# Patient Record
Sex: Female | Born: 1970 | Race: White | Hispanic: No | Marital: Married | Smoking: Never smoker
Health system: Southern US, Community
[De-identification: ages and names within clinical notes are randomized; demographics above are authoritative.]

## PROBLEM LIST (undated history)

## (undated) DIAGNOSIS — R002 Palpitations: Secondary | ICD-10-CM

## (undated) DIAGNOSIS — R7303 Prediabetes: Secondary | ICD-10-CM

## (undated) DIAGNOSIS — I10 Essential (primary) hypertension: Secondary | ICD-10-CM

## (undated) DIAGNOSIS — Z974 Presence of external hearing-aid: Secondary | ICD-10-CM

## (undated) DIAGNOSIS — I509 Heart failure, unspecified: Secondary | ICD-10-CM

## (undated) DIAGNOSIS — M2550 Pain in unspecified joint: Secondary | ICD-10-CM

## (undated) DIAGNOSIS — E669 Obesity, unspecified: Secondary | ICD-10-CM

## (undated) DIAGNOSIS — R0600 Dyspnea, unspecified: Secondary | ICD-10-CM

## (undated) DIAGNOSIS — R079 Chest pain, unspecified: Secondary | ICD-10-CM

## (undated) DIAGNOSIS — G4730 Sleep apnea, unspecified: Secondary | ICD-10-CM

## (undated) DIAGNOSIS — Z9889 Other specified postprocedural states: Secondary | ICD-10-CM

## (undated) DIAGNOSIS — Z87442 Personal history of urinary calculi: Secondary | ICD-10-CM

## (undated) DIAGNOSIS — M549 Dorsalgia, unspecified: Secondary | ICD-10-CM

## (undated) DIAGNOSIS — S42409A Unspecified fracture of lower end of unspecified humerus, initial encounter for closed fracture: Secondary | ICD-10-CM

## (undated) DIAGNOSIS — J45909 Unspecified asthma, uncomplicated: Secondary | ICD-10-CM

## (undated) DIAGNOSIS — Z9289 Personal history of other medical treatment: Secondary | ICD-10-CM

## (undated) DIAGNOSIS — Z973 Presence of spectacles and contact lenses: Secondary | ICD-10-CM

## (undated) DIAGNOSIS — E785 Hyperlipidemia, unspecified: Secondary | ICD-10-CM

## (undated) DIAGNOSIS — E559 Vitamin D deficiency, unspecified: Secondary | ICD-10-CM

## (undated) DIAGNOSIS — R600 Localized edema: Secondary | ICD-10-CM

## (undated) DIAGNOSIS — F909 Attention-deficit hyperactivity disorder, unspecified type: Secondary | ICD-10-CM

## (undated) DIAGNOSIS — I2510 Atherosclerotic heart disease of native coronary artery without angina pectoris: Secondary | ICD-10-CM

## (undated) DIAGNOSIS — F419 Anxiety disorder, unspecified: Secondary | ICD-10-CM

## (undated) DIAGNOSIS — L219 Seborrheic dermatitis, unspecified: Secondary | ICD-10-CM

## (undated) DIAGNOSIS — K219 Gastro-esophageal reflux disease without esophagitis: Secondary | ICD-10-CM

## (undated) HISTORY — DX: Prediabetes: R73.03

## (undated) HISTORY — DX: Other specified postprocedural states: Z98.890

## (undated) HISTORY — DX: Hyperlipidemia, unspecified: E78.5

## (undated) HISTORY — DX: Palpitations: R00.2

## (undated) HISTORY — DX: Dorsalgia, unspecified: M54.9

## (undated) HISTORY — DX: Dyspnea, unspecified: R06.00

## (undated) HISTORY — DX: Anxiety disorder, unspecified: F41.9

## (undated) HISTORY — DX: Unspecified asthma, uncomplicated: J45.909

## (undated) HISTORY — DX: Heart failure, unspecified: I50.9

## (undated) HISTORY — DX: Seborrheic dermatitis, unspecified: L21.9

## (undated) HISTORY — DX: Localized edema: R60.0

## (undated) HISTORY — DX: Pain in unspecified joint: M25.50

## (undated) HISTORY — DX: Personal history of other medical treatment: Z92.89

## (undated) HISTORY — DX: Essential (primary) hypertension: I10

## (undated) HISTORY — DX: Chest pain, unspecified: R07.9

---
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
## 1998-08-09 ENCOUNTER — Emergency Department (HOSPITAL_COMMUNITY): Admission: EM | Admit: 1998-08-09 | Discharge: 1998-08-09 | Payer: Self-pay | Admitting: Emergency Medicine

## 1998-08-12 ENCOUNTER — Encounter: Admission: RE | Admit: 1998-08-12 | Discharge: 1998-11-10 | Payer: Self-pay | Admitting: Internal Medicine

## 1999-05-13 ENCOUNTER — Emergency Department (HOSPITAL_COMMUNITY): Admission: EM | Admit: 1999-05-13 | Discharge: 1999-05-13 | Payer: Self-pay

## 1999-07-20 ENCOUNTER — Emergency Department (HOSPITAL_COMMUNITY): Admission: EM | Admit: 1999-07-20 | Discharge: 1999-07-20 | Payer: Self-pay | Admitting: Emergency Medicine

## 2000-10-10 ENCOUNTER — Inpatient Hospital Stay (HOSPITAL_COMMUNITY): Admission: AD | Admit: 2000-10-10 | Discharge: 2000-10-13 | Payer: Self-pay | Admitting: *Deleted

## 2001-11-29 ENCOUNTER — Other Ambulatory Visit: Admission: RE | Admit: 2001-11-29 | Discharge: 2001-11-29 | Payer: Self-pay | Admitting: Obstetrics and Gynecology

## 2002-12-20 ENCOUNTER — Other Ambulatory Visit: Admission: RE | Admit: 2002-12-20 | Discharge: 2002-12-20 | Payer: Self-pay | Admitting: Obstetrics and Gynecology

## 2004-01-21 ENCOUNTER — Other Ambulatory Visit: Admission: RE | Admit: 2004-01-21 | Discharge: 2004-01-21 | Payer: Self-pay | Admitting: Obstetrics and Gynecology

## 2004-11-04 ENCOUNTER — Encounter: Admission: RE | Admit: 2004-11-04 | Discharge: 2004-11-04 | Payer: Self-pay | Admitting: Family Medicine

## 2005-02-13 ENCOUNTER — Other Ambulatory Visit: Admission: RE | Admit: 2005-02-13 | Discharge: 2005-02-13 | Payer: Self-pay | Admitting: Obstetrics and Gynecology

## 2005-03-01 ENCOUNTER — Emergency Department (HOSPITAL_COMMUNITY): Admission: EM | Admit: 2005-03-01 | Discharge: 2005-03-01 | Payer: Self-pay | Admitting: Emergency Medicine

## 2005-03-06 ENCOUNTER — Ambulatory Visit (HOSPITAL_COMMUNITY): Admission: RE | Admit: 2005-03-06 | Discharge: 2005-03-06 | Payer: Self-pay | Admitting: Obstetrics and Gynecology

## 2005-03-06 HISTORY — PX: TUBAL LIGATION: SHX77

## 2005-03-06 HISTORY — PX: LAPAROSCOPIC LYSIS OF ADHESIONS: SHX5905

## 2005-10-07 ENCOUNTER — Encounter: Admission: RE | Admit: 2005-10-07 | Discharge: 2005-10-07 | Payer: Self-pay | Admitting: Otolaryngology

## 2005-10-13 ENCOUNTER — Other Ambulatory Visit: Admission: RE | Admit: 2005-10-13 | Discharge: 2005-10-13 | Payer: Self-pay | Admitting: Otolaryngology

## 2006-11-02 ENCOUNTER — Ambulatory Visit (HOSPITAL_COMMUNITY): Admission: RE | Admit: 2006-11-02 | Discharge: 2006-11-03 | Payer: Self-pay | Admitting: Obstetrics and Gynecology

## 2006-11-02 ENCOUNTER — Encounter (INDEPENDENT_AMBULATORY_CARE_PROVIDER_SITE_OTHER): Payer: Self-pay | Admitting: *Deleted

## 2006-11-02 HISTORY — PX: LAPAROSCOPIC ASSISTED VAGINAL HYSTERECTOMY: SHX5398

## 2007-03-23 ENCOUNTER — Ambulatory Visit (HOSPITAL_COMMUNITY): Admission: RE | Admit: 2007-03-23 | Discharge: 2007-03-23 | Payer: Self-pay | Admitting: Family Medicine

## 2007-08-12 ENCOUNTER — Encounter: Admission: RE | Admit: 2007-08-12 | Discharge: 2007-08-12 | Payer: Self-pay | Admitting: Family Medicine

## 2007-08-23 ENCOUNTER — Encounter: Admission: RE | Admit: 2007-08-23 | Discharge: 2007-08-23 | Payer: Self-pay | Admitting: Family Medicine

## 2008-09-18 DIAGNOSIS — L219 Seborrheic dermatitis, unspecified: Secondary | ICD-10-CM | POA: Insufficient documentation

## 2010-07-31 DIAGNOSIS — F411 Generalized anxiety disorder: Secondary | ICD-10-CM | POA: Insufficient documentation

## 2010-07-31 DIAGNOSIS — F3289 Other specified depressive episodes: Secondary | ICD-10-CM | POA: Insufficient documentation

## 2010-07-31 DIAGNOSIS — F418 Other specified anxiety disorders: Secondary | ICD-10-CM | POA: Insufficient documentation

## 2010-07-31 DIAGNOSIS — F32A Depression, unspecified: Secondary | ICD-10-CM | POA: Insufficient documentation

## 2010-08-20 DIAGNOSIS — E039 Hypothyroidism, unspecified: Secondary | ICD-10-CM | POA: Insufficient documentation

## 2010-08-20 DIAGNOSIS — R7309 Other abnormal glucose: Secondary | ICD-10-CM | POA: Insufficient documentation

## 2010-08-20 DIAGNOSIS — E559 Vitamin D deficiency, unspecified: Secondary | ICD-10-CM | POA: Insufficient documentation

## 2010-12-05 DIAGNOSIS — M546 Pain in thoracic spine: Secondary | ICD-10-CM | POA: Insufficient documentation

## 2010-12-05 DIAGNOSIS — M62838 Other muscle spasm: Secondary | ICD-10-CM | POA: Insufficient documentation

## 2010-12-05 DIAGNOSIS — J069 Acute upper respiratory infection, unspecified: Secondary | ICD-10-CM | POA: Insufficient documentation

## 2011-04-03 NOTE — H&P (Signed)
[name], CAMERER             ACCOUNT NO.:  [id number]   MEDICAL RECORD NO.:  [id number]          PATIENT TYPE:  AMB   LOCATION:                                F[facility]ILITY:  [facility]   PHYSICIAN:  Guy Sandifer. Henderson Cloud, M.D.      DATE OF BIRTH:   DATE OF ADMISSION:  10/29/2006  DATE OF DISCHARGE:                              HISTORY & PHYSICAL   CHIEF COMPLAINT:  Painful menses.   HISTORY OF PRESENT ILLNESS:  This patient is a 40 year old married white  female, G2 P2 with a long history of dysmenorrhea and dyspareunia.  Laparoscopy in 2006 was notable for a small band of omental adhesions  which was taken down.  Tubal ligation was performed, at that time as  well.  Since then, she continues to have significantly painful menses as  well as pain with intercourse.  After a careful discussion of the  options she is being admitted for laparoscopically assisted vaginal  hysterectomy.  The potential risks and complications have been discussed  with the patient preoperatively.  Ultrasound on 04/19/2006 revealed the  uterus measuring 96 x 4.7 x 5.9 cm with normal appearing ovaries.   PAST MEDICAL HISTORY:  1. Depression.  2. Hypothyroidism.   PAST SURGICAL HISTORY:  1. Laparoscopy with tubal ligation as above.  2. Root canal surgery on 10/27/2006.   OBSTETRIC HISTORY:  Cesarean section x2.   MEDICATIONS:  1. Zoloft daily.  2. Percocet p.r.n. pain status post root canal.  3. Ampicillin 250 mg status post root canal.   ALLERGIES:  No known drug allergies.   FAMILY HISTORY:  Coronary artery disease in the father and stroke in the  maternal grandmother, diabetes in the maternal great aunt, maternal  grandmother, and maternal first cousins.   SOCIAL HISTORY:  The patient denies tobacco, alcohol, or drug abuse.   REVIEW OF SYSTEMS:  NEUROLOGIC:  Denies headache, or chest pain.  PULMONARY:  Denies shortness of breath.  GI:  Denies recent changes in  bowel habits.   PHYSICAL EXAMINATION:   VITAL SIGNS:  Height 5 feet 3 inches.  Weight 206  pounds.  Blood pressure 112/72.  HEENT:  Without thyromegaly.  LUNGS:  Clear to auscultation.  HEART:  Regular rate and rhythm.  BACK:  Without CVA tenderness.  BREASTS:  Without masses, __________ or discharge.  ABDOMEN:  Soft nontender, without masses.  PELVIC:  Vulva, vagina, and cervix without lesions.  Uterus is mildly  tender.  Left adnexa nontender without masses. Right adnexa mildly  tender without masses.  EXTREMITIES:  Grossly within normal limits.  NEUROLOGICAL EXAM:  Grossly within normal limits.   ASSESSMENT:  Dysmenorrhea.   PLAN:  Laparoscopically assisted vaginal hysterectomy.      Guy Sandifer Henderson Cloud, M.D.  Electronically Signed     JET/MEDQ  D:  10/28/2006  T:  10/28/2006  Job:  [id number]

## 2011-04-03 NOTE — Op Note (Signed)
[facility]  Patient:    [name], [name]                      MRN: [id number] Adm. Date:  [id number] Attending:  Donne Hazel CC:         Roseanna Rainbow, M.D.  [facility], [place]   Operative Report  PREOPERATIVE DIAGNOSES:       1.  Intrauterine pregnancy at term.                               2.  Failed vaginal birth after cesarean section.                               3.  Arrest of dilatation.                               4.  Arrest of descent.                               5.  Occipitoposterior presentation.  POSTOPERATIVE DIAGNOSES:      1.  Intrauterine pregnancy at term.                               2.  Failed vaginal birth after cesarean section.                               3.  Arrest of dilatation.                               4.  Arrest of descent.                               5.  Occipitoposterior presentation.  OPERATION:                    Repeat cesarean section, low cervical transverse.  SURGEON:                      Beather Arbour. Thomasena Edis, M.D., Ph.D.  ASSISTANT:                    Roseanna Rainbow, M.D.  ANESTHESIA:                   Epidural.  ESTIMATED BLOOD LOSS:         800 cc.  FLUIDS:                       1900 cc of crystalloid and ampicillin GBS prophylaxis since onset of active labor.  DRAINS:                       Foley.  COMPLICATIONS:                None.  DESCRIPTION OF PROCEDURE:     The patient was brought to the operating room, identified while on the operating room table. After topping  off the patients epidural she was placed in a left uterine displacement position and prepped an d draped in the usual sterile fashion. A Foley catheter had been previously placed. A Pfannenstiel incision was made through the patients previous incision and carried down to the fascia. The fascia was scored in midline and extended bilaterally using Mayo scissors. It was then separated  free from the underlying muscles. The muscles were separated in midline down to the symphysis. The peritoneum was then entered bluntly and carefully taking care to avoid bowel or other abdominal contents. The peritoneal incision was extended superiorly then inferiorly down to the bladder edge. The bladder flap was developed and the bladder blade was placed. The uterus was scored in a lower uterine segment and the incision was extended in a curvilinear fashion using bandage scissors. The vertex was noted to be wedged deep into the pelvis and very gently elevated up to the level of the incision. It was noted the OP presentation was confirmed. The infant was delivered without difficulty and bulb suctioned. The remainder of the infant was delivered without difficulty. The cord was doubly clamped and cut and the baby was handed to the awaiting neonatal resuscitation team. Cord pH was sent. Cord bloods were collected. The placenta was allowed to delivery spontaneously. The uterus was wiped clean of products of conception and exteriorized onto the abdomen. There was noted to be one omental adhesion to the posterior surface of the uterus and this was divided using cautery and noted to be very hemostatic. The bladder blade was replaced. There was noted to be a small left uterine cervical extension which was repaired using a simple running interlocking suture of 0 Monocryl. This was noted to be very small, approximately 2.5 cm. The uterus was then closed using a simple running interlocking suture with 0 Monocryl. There was noted to be excellent cervical extension and uterine incision hemostasis with the exception of one bleeding area. Hemostasis of this area was achieved using a suture of 0 Monocryl. After again noting excellent uterine incision hemostasis, and noting that uterus, tubes, and ovaries appeared grossly normal, the uterus was placed back into the abdominal cavity. The incision  was irrigated copiously with warm lactated ringers. The gutters were irrigated free of clots. The incision was again inspected and noted to be extremely hemostatic. The bladder flap was noted to be hemostatic as well. The Kelly clamps per placed on the peritoneum and the peritoneum was closed using simple running suture of 2-0 Monocryl. Additional sutures of 2-0 Monocryl was used to tack the muscles together over the bladder. The subfascial areas were examined for evidence of hemostasis and excellent hemostasis was achieved using cautery. The fascia was closed using two sutures of 0 PDS with each suture anchored at either end at the angle of incision, run through the midline and tied. Excellent fascial closure was assured. The subcutaneous tissue was irrigated copiously with warm lactated ringers and excellent hemostasis was achieved using cautery. The skin was closed with staples and Steri-Strips were applied. The baby was found to be a viable female, Apgars 9 and 9, cord pH 7.32 and went to the normal newborn nursery. DD:  10/10/00 TD:  10/10/00 Job: [id number]

## 2011-04-03 NOTE — H&P (Signed)
[name], DELANGEL             ACCOUNT NO.:  [id number]   MEDICAL RECORD NO.:  [id number]          PATIENT TYPE:  AMB   LOCATION:  SDC                           [facility]ILITY:  [facility]   PHYSICIAN:  Guy Sandifer. Tomblin II, M.D.DATE OF BIRTH:  17-Apr-1971   DATE OF ADMISSION:  03/05/2005  DATE OF DISCHARGE:                                HISTORY & PHYSICAL   CHIEF COMPLAINT:  Desires permanent sterilization.   HISTORY OF PRESENT ILLNESS:  The patient is a 40 year old married white  female, G2, P2, who desires permanent sterilization.  She is being admitted  for a laparoscopy with tubal ligation with _Filshie_________ clips.  Alternate methods of contraception, as well as the potential risks and  complications, have been reviewed preoperatively.  The patient also  complains of some dyspareunia and dysmenorrhea recently.   PAST MEDICAL HISTORY:  1.  Depression.  2.  Hypothyroidism, currently not on replacement.   PAST SURGICAL HISTORY:  Negative.   OBSTETRIC HISTORY:  Cesarean section x2.   MEDICATIONS:  1.  Birth control pill daily.  2.  Zoloft 50 mg daily.   ALLERGIES:  No known drug allergies.   FAMILY HISTORY:  Coronary artery disease in father.  Stroke in maternal  grandmother.  Diabetes in maternal great aunt, paternal grandmother, and  paternal first cousin.   SOCIAL HISTORY:  The patient denies tobacco, alcohol, or drug abuse.   REVIEW OF SYSTEMS:  NEUROLOGIC:  Denies headache.  CARDIAC:  Denies chest  pain.  PULMONARY:  Denies shortness of breath.  GI:  Denies recent changes  in bowel habits.   PHYSICAL EXAMINATION:  VITAL SIGNS:  Height 5 feet, 3 inches, weight 206  pounds, blood pressure 126/80.  HEENT:  Within normal limits.  LUNGS:  Clear to auscultation.  HEART:  Regular rate and rhythm.  BACK:  Without CVA tenderness.  BREASTS:  Without mass, retraction, or discharge.  ABDOMEN:  Soft, nontender, without masses.  PELVIC:  Vulva, vagina, cervix without lesions.   Uterus normal size, mobile,  nontender.  Adnexa nontender without masses.  EXTREMITIES:  Neurological exam grossly within normal limits.   ASSESSMENT:  Desires permanent sterilization.   PLAN:  Laparoscopy with tubal ligation.      JET/MEDQ  D:  03/04/2005  T:  03/04/2005  Job:  [id number]

## 2011-04-03 NOTE — Discharge Summary (Signed)
NAMESHANTRELL, PLACZEK             ACCOUNT NO.:  [id number]   MEDICAL RECORD NO.:  [id number]          PATIENT TYPE:  OIB   LOCATION:  9319                          [facility]ACILITY:  [facility]   PHYSICIAN:  Guy Sandifer. Henderson Cloud, M.D. DATE OF BIRTH:  1971/05/01   DATE OF ADMISSION:  11/02/2006  DATE OF DISCHARGE:  11/03/2006                               DISCHARGE SUMMARY   ADMITTING DIAGNOSES:  1. Dysmenorrhea.  2. Dyspareunia.   DISCHARGE DIAGNOSES:  1. Dysmenorrhea.  2. Dyspareunia.   PROCEDURE:  On November 02, 2006, laparoscopically assisted vaginal  hysterectomy.   REASON FOR ADMISSION:  This patient is a 40 year old married white  female, G2, P2, with a long history of dysmenorrhea and dyspareunia.  She is admitted for surgical management.  Details are dictated in the  history and physical.   HOSPITAL COURSE:  She is admitted to the hospital and undergoes the  above procedure.  On the evening of surgery, she had nausea x1, but got  good relief with medication.  She had good pain relief.  Vital signs are  stable.  Urine output is clear.  On the day of discharge, she is passing  flatus, getting good pain relief and has no complaints of nausea.  Vital  signs are stable and she is afebrile.  Hemoglobin is 12.3, white count  8.9 and platelet count 275,000.  Pathology is pending.   CONDITION ON DISCHARGE:  Good.   DIET:  Regular as tolerated.   ACTIVITY:  No lifting.  No operation of automobiles.  No vaginal entry.   DISCHARGE INSTRUCTIONS:  She is to call the office for problems  including, but not limited to temperature of 101 degrees, heavy vaginal  bleeding, increasing pain or persistent nausea or vomiting.   FOLLOWUP:  In the office in 2 weeks.      Guy Sandifer Henderson Cloud, M.D.  Electronically Signed     JET/MEDQ  D:  11/03/2006  T:  11/03/2006  Job:  [id number]

## 2011-04-03 NOTE — Op Note (Signed)
NAMECYNTHIE, GARMON             ACCOUNT NO.:  [id number]   MEDICAL RECORD NO.:  [id number]          PATIENT TYPE:  AMB   LOCATION:  SDC                           [facility]   PHYSICIAN:  Guy Sandifer. Henderson Cloud, M.D. DATE OF BIRTH:  Mar 22, 1971   DATE OF PROCEDURE:  03/06/2005  DATE OF DISCHARGE:                                 OPERATIVE REPORT   PREOPERATIVE DIAGNOSIS:  Desires permanent sterilization.   POSTOPERATIVE DIAGNOSIS:  Desires permanent sterilization.   PROCEDURES:  1. Laparoscopy with bilateral tubal ligation with Filshie clips.  2. Lysis of adhesions.     SURGEON:  Guy Sandifer. Henderson Cloud, M.D.   ANESTHESIA:  General with endotracheal intubation.   ESTIMATED BLOOD LOSS:  Drops.   INDICATIONS AND CONSENT:  This patient is a 40 year old married white  female, G2, P2, who desires permanent sterilization.  Alternate methods of  contraception have been discussed preoperatively.  Potential risks and  complications have been discussed preoperatively, including but not limited  to infection; bowel, bladder or ureteral damage; bleeding requiring  transfusion of blood products with possible transfusion reaction, HIV and  hepatitis acquisition; DVT, PE, and pneumonia; permanence of the procedure,  failure rate and increased ectopic risk.  All questions have been answered  and consent is signed on the chart.   FINDINGS:  Upper abdomen is grossly normal.  There is a single thin band of  omental adhesion to the midline in the anterior abdominal wall.  The uterus  is upper normal limits.  Anterior, posterior cul-de-sacs, bilateral  fallopian tubes and bilateral ovaries are normal.   PROCEDURE:  The patient is taken to the operating room, where she is  identified, placed in the dorsal supine position, and general anesthesia is  induced via endotracheal intubation.  She is then placed in the dorsal  lithotomy position, where she is prepped abdominally and vaginally, the  bladder is  straight-catheterized, a Hulka tenaculum is placed in the uterus  as a manipulator.  She is draped in a sterile fashion.  A small  infraumbilical incision is made and a disposable Veress needle is placed  with good syringe and drop test.  Two liters of gas were then insufflated  under low pressure with good tympany in the right upper quadrant.  The  Veress needle was removed and the disposable 10/11 trocar sleeve is then  placed without difficulty.  Placement is verified with the laparoscope and  no damage to surrounding structures is noted.  A small suprapubic incision  is made and a 5 mm nondisposable trocar sleeve is placed under direct  visualization without difficulty.  Using bipolar cautery, the adhesion is  cauterized at its insertion point in the anterior abdominal wall and then  taken down sharply without difficulty.  Good hemostasis is noted.  The right  fallopian tube is identified from cornu to fimbriae and a Filshie slip is  applied on the proximal one-third of the tube.  A similar procedure is  carried out on the left tube.  After removal of the Filshie clip applicator,  inspection reveals the entire width of the tube  to be within the clip as  well as the heel of the clip to be readily visible through the mesosalpinx.  Good hemostasis is noted all around.  Suprapubic trocar sleeve is removed,  pneumoperitoneum is reduced, and the umbilical trocar sleeve is removed.  A  2-0 Vicryl suture is used to reapproximate the subcutaneous tissue in the  umbilical incision with a single stitch.  Both incisions are injected with  0.5% plain Marcaine, and Dermabond is used to close both skin incisions.  A  Hulka tenaculum is removed and no bleeding is noted.  All counts are  correct.  The patient is awakened and taken to the recovery room in stable  condition.      JET/MEDQ  D:  03/06/2005  T:  03/06/2005  Job:  [id number]

## 2011-04-03 NOTE — Discharge Summary (Signed)
[facility]  Patient:    [name], [name]                      MRN: [id number] Adm. Date:  [id number] Disc. Date: 10/13/00 Attending:  Donne Hazel Dictator:   Danie Chandler, R.N.                           Discharge Summary  ADMITTING DIAGNOSES:          1. Intrauterine pregnancy at term in early                                  labor.                               2. Desires vaginal birth after cesarean section.  DISCHARGE DIAGNOSES:          1. Intrauterine pregnancy at term in early                                  labor.                               2. Desires vaginal birth after cesarean section.                               3. Failed vaginal birth after cesarean section.                               4. Arrest of dilatation.                               5. Arrest of descent.                               6. Occipitoposterior presentation.  PROCEDURE:                    On October 10, 2000 repeat low cervical cesarean section.  REASON FOR ADMISSION:         The patient is a 40 year old married white female gravida 2, para 1 status post cesarean section in 1998.  The patient came to maternity admissions unit in early labor.  She desired a VBAC.  The patient at the time of admission was 5 cm, 80%, vertex, and -3.  Fetal heart tones were reactive.  The patient was admitted and an IUPC was placed.  The patient did have Pitocin augmentation which was turned off when there were some variables noted.  The patient progressed to 8 cm, -1 with much molding. Fetal heart tones remained reactive, but with some variables.  Uterine contractions were adequate for approximately two hours.  They were between 210-270 MVUs.  There was OP presentation noted.  In view of no appreciable change despite adequate uterine contractions documented by the IUPC decision was made to proceed with repeat cesarean section for arrest of descent, dilatation, failed  VBAC.  HOSPITAL COURSE:  The patient was taken to the operating room and underwent the above named procedure without complications.  Was productive of a viable female infant with Apgars of 9 at one minute and 9 at five minutes and an arterial cord pH of 7.32.  Postoperatively the patient did very well.  On postoperative day #1 the patient had a good return of bowel function.  She had good control of pain also.  Her hemoglobin on this day was 11.2, hematocrit 32.4, and white blood cell count 12.7.  On postoperative day #2 she was tolerating a regular diet and ambulating well without difficulty.  She was discharged home on postoperative day #3.  CONDITION ON DISCHARGE:       Good.  DIET:                         Regular, as tolerated.  ACTIVITY:                     No heavy lifting, driving, or vaginal entry.  FOLLOW-UP:                    She is to follow up in the office in one to two weeks for incision check.  She is to call for temperature greater than 100 degrees, persistent nausea or vomiting, heavy vaginal bleeding, and/or redness or drainage from the incision site.  DISCHARGE MEDICATIONS:        1. Prenatal vitamin one p.o. q.d.                               2. Pain medicines as directed by M.D.DD: 10/13/00 TD:  10/13/00 Job: [id number]

## 2011-04-03 NOTE — Op Note (Signed)
[name], [name]             ACCOUNT NO.:  [id number]   MEDICAL RECORD NO.:  [id number]          PATIENT TYPE:  OIB   LOCATION:  9319                          [facility]   PHYSICIAN:  Guy Sandifer. Henderson Cloud, M.D. DATE OF BIRTH:  06-04-1971   DATE OF PROCEDURE:  11/02/2006  DATE OF DISCHARGE:                               OPERATIVE REPORT   PREOPERATIVE DIAGNOSES:  1. Dysmenorrhea.  2. Dyspareunia.   POSTOPERATIVE DIAGNOSES:  1. Dysmenorrhea.  2. Dyspareunia.   PROCEDURE:  Laparoscopically-assisted vaginal hysterectomy.   SURGEON:  Guy Sandifer. Henderson Cloud, M.D.   ASSISTANT:  Dineen Kid. Rana Snare, M.D.   ANESTHESIA:  General with endotracheal intubation.   SPECIMENS:  Uterus to pathology.   ESTIMATED BLOOD LOSS:  300 mL.   INDICATIONS AND CONSENT:  This patient is a 40 year old married white  female, G2, P2 with a long history of dysmenorrhea and dyspareunia.  Details are dictated in the history and physical.  Laparoscopically-  assisted vaginal hysterectomy and removal of the tube and ovary only if  distinctly abnormal have been discussed preoperatively.  Potential risks  and complications are discussed preoperatively including, but limited  to, infection, bowel, bladder, ureteral damage, bleeding requiring  transfusion of blood products with possible transfusion reaction, HIV  and hepatitis acquisition, DVT, PE, pneumonia, laparotomy, fistula  formation, postoperative pain or dyspareunia.  All questions were  answered and consent signed on the chart.   FINDINGS:  Upper abdomen is grossly normal.  Uterus is 6 weeks in size.  Anterior and posterior cul-de-sacs were normal.  Tubes are status post  Filshie ligation bilaterally and ovaries are normal.   PROCEDURE:  The patient is taken to operating room where she is  identified, placed in the dorsal supine position.  General anesthesia is  induced via endotracheal intubation.  She is then placed in the dorsal  lithotomy position  where she is prepped abdominally and vaginally.  Bladder straight catheterized.  Hulka tenaculum is placed.  Uterus is  manipulated.  She is draped in sterile fashion.  Infraumbilical and  suprapubic areas are infiltrated in the midline with 0.50% plain  Marcaine.  Small infraumbilical incision is made and a disposable Veress  needle is placed on the first attempt without difficulty.  Syringe and  drop test are normal.  Two liters of gas are insufflated under low  pressure with good tympany in the right upper quadrant.  Veress needle  is removed and the 10/11 XL bladeless disposable trocar sleeve was is  placed using direct visualization with the diagnostic laparoscope.  After placement, the operative laparoscope is placed.  Small suprapubic  incision is made in the midline and a 5 mm XL bladeless disposable  trocar sleeve is placed under direct visualization.  The above findings  are noted.  The Filshie clips were easily removed bilaterally and are  delivered from the abdomen.  Proximal ligaments are then taken down  bilaterally with the Gyrus bipolar cautery cutting instrument down to  the level of the vesicouterine peritoneum.  Vesicouterine peritoneum is  taken down cephalolaterally.  Good hemostasis is noted.  Suprapubic  trocar sleeve is removed.  Instruments are removed and attention is  turned to the vagina.  Posterior cul-de-sac is entered sharply and the  cervix is circumscribed with cautery.  Mucosa is advanced sharply and  bluntly.  Uterosacral ligaments are taken down bilaterally with the  Gyrus bipolar cautery.  Progressive bites are taken of the cardinals,  uterine vessels bilaterally.  Anterior cul-de-sac is entered without  difficulty.  Specimen is delivered posteriorly and the proximal  ligaments are taken down.  Uterosacral ligaments are then plicated to  the vaginal cuff bilaterally with separate sutures of 0 Monocryl.  All  suture will be 0 Monocryl unless otherwise  designated.  Sutures are  placed at the 3 and 9 o'clock positions to assure complete hemostasis.  Cuff is then closed with figure-of-eights.  Foley catheter is placed in  the bladder and clear urine is noted.  Attention is returned to the  abdomen.  Minor bleeding at peritoneal edges is controlled with bipolar  cautery.  Inspection under reduced pneumoperitoneum reveals continued  hemostasis.  Excess fluid is removed.  All instruments are removed.  The  umbilical incision is closed with a 0 Vicryl suture in the subcutaneous  layers.  Mattress suture of 0 Vicryl is placed in the suprapubic  incision to aid in hemostasis.  Skin is closed with Dermabond on all  sites.  All counts are correct.  The patient is awakened and taken to  the recovery room in stable condition.      Guy Sandifer Henderson Cloud, M.D.  Electronically Signed     JET/MEDQ  D:  11/02/2006  T:  11/02/2006  Job:  [id number]

## 2011-08-24 ENCOUNTER — Encounter: Payer: Self-pay | Admitting: Cardiovascular Disease

## 2012-09-23 ENCOUNTER — Ambulatory Visit
Admission: RE | Admit: 2012-09-23 | Discharge: 2012-09-23 | Disposition: A | Discharge status: Home / Self Care | Payer: Self-pay | Source: Ambulatory Visit | Attending: Physician Assistant | Admitting: Physician Assistant

## 2012-09-23 ENCOUNTER — Other Ambulatory Visit: Payer: Self-pay | Admitting: Physician Assistant

## 2012-09-23 DIAGNOSIS — M25571 Pain in right ankle and joints of right foot: Secondary | ICD-10-CM

## 2013-01-15 ENCOUNTER — Emergency Department (HOSPITAL_BASED_OUTPATIENT_CLINIC_OR_DEPARTMENT_OTHER): Payer: 59

## 2013-01-15 ENCOUNTER — Encounter (HOSPITAL_BASED_OUTPATIENT_CLINIC_OR_DEPARTMENT_OTHER): Payer: Self-pay

## 2013-01-15 ENCOUNTER — Emergency Department (HOSPITAL_BASED_OUTPATIENT_CLINIC_OR_DEPARTMENT_OTHER)
Admission: EM | Admit: 2013-01-15 | Discharge: 2013-01-15 | Disposition: A | Discharge status: Home / Self Care | Payer: 59 | Attending: Emergency Medicine | Admitting: Emergency Medicine

## 2013-01-15 DIAGNOSIS — R110 Nausea: Secondary | ICD-10-CM | POA: Insufficient documentation

## 2013-01-15 DIAGNOSIS — S42401A Unspecified fracture of lower end of right humerus, initial encounter for closed fracture: Secondary | ICD-10-CM

## 2013-01-15 DIAGNOSIS — S52023A Displaced fracture of olecranon process without intraarticular extension of unspecified ulna, initial encounter for closed fracture: Secondary | ICD-10-CM | POA: Insufficient documentation

## 2013-01-15 DIAGNOSIS — Y929 Unspecified place or not applicable: Secondary | ICD-10-CM | POA: Insufficient documentation

## 2013-01-15 DIAGNOSIS — Y9355 Activity, bike riding: Secondary | ICD-10-CM | POA: Insufficient documentation

## 2013-01-15 DIAGNOSIS — E669 Obesity, unspecified: Secondary | ICD-10-CM | POA: Insufficient documentation

## 2013-01-15 DIAGNOSIS — S42409A Unspecified fracture of lower end of unspecified humerus, initial encounter for closed fracture: Secondary | ICD-10-CM

## 2013-01-15 HISTORY — DX: Obesity, unspecified: E66.9

## 2013-01-15 HISTORY — DX: Unspecified fracture of lower end of unspecified humerus, initial encounter for closed fracture: S42.409A

## 2013-01-15 MED ORDER — PROPOFOL 10 MG/ML IV BOLUS
0.5000 mg/kg | Freq: Once | INTRAVENOUS | Status: AC
  Administered 2013-01-15: 50 mg via INTRAVENOUS
  Administered 2013-01-15: 53.3 mg via INTRAVENOUS
  Filled 2013-01-15: qty 20

## 2013-01-15 MED ORDER — FENTANYL CITRATE 0.05 MG/ML IJ SOLN
INTRAMUSCULAR | Status: AC | PRN
  Administered 2013-01-15: 50 ug via INTRAVENOUS

## 2013-01-15 MED ORDER — SODIUM CHLORIDE 0.9 % IV SOLN
INTRAVENOUS | Status: DC
  Administered 2013-01-15: 18:00:00 via INTRAVENOUS

## 2013-01-15 MED ORDER — HYDROMORPHONE HCL PF 1 MG/ML IJ SOLN
INTRAMUSCULAR | Status: AC
  Administered 2013-01-15: 1 mg via INTRAVENOUS
  Filled 2013-01-15: qty 1

## 2013-01-15 MED ORDER — HYDROMORPHONE HCL PF 2 MG/ML IJ SOLN
2.0000 mg | Freq: Once | INTRAMUSCULAR | Status: DC
  Filled 2013-01-15: qty 1

## 2013-01-15 MED ORDER — ONDANSETRON HCL 4 MG/2ML IJ SOLN
4.0000 mg | Freq: Once | INTRAMUSCULAR | Status: AC
  Administered 2013-01-15: 4 mg via INTRAVENOUS
  Filled 2013-01-15: qty 2

## 2013-01-15 MED ORDER — FENTANYL CITRATE 0.05 MG/ML IJ SOLN
50.0000 ug | Freq: Once | INTRAMUSCULAR | Status: AC
  Administered 2013-01-15: 50 ug via INTRAVENOUS
  Filled 2013-01-15: qty 2

## 2013-01-15 MED ORDER — HYDROMORPHONE HCL PF 1 MG/ML IJ SOLN
1.0000 mg | Freq: Once | INTRAMUSCULAR | Status: AC
  Administered 2013-01-15: 1 mg via INTRAVENOUS

## 2013-01-15 MED ORDER — OXYCODONE-ACETAMINOPHEN 5-325 MG PO TABS: 1.0000 | ORAL_TABLET | Freq: Four times a day (QID) | ORAL | Status: DC | PRN

## 2013-01-15 NOTE — Progress Notes (Signed)
Shoulder immobilizer applied to patient's right arm.  Patient and family verbalized understanding of use and adjustment.
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
## 2013-01-15 NOTE — ED Notes (Signed)
MD at bedside. 
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
## 2013-01-15 NOTE — Discharge Instructions (Signed)
Radial Head Fracture  A radial head fracture is a break of the smaller bone (radius) in the forearm. The head of this bone is the part near the elbow. These fractures commonly happen during a fall when you land on the outstretched arm. These fractures are more common in middle aged adults and are common with a dislocation of the elbow.  SYMPTOMS    Swelling of the elbow joint and pain on the outside of the elbow.   Pain and difficulty in bending or straightening the elbow.   Pain and difficulty in turning the palm of the hand up or down with the elbow bent.  DIAGNOSIS   Your caregiver may make this diagnosis by a physical exam. X-rays can confirm the type and amount of break. Sometimes a break which is not displaced cannot be seen on the original x-ray.  TREATMENT   Radial head fractures are classified according to the amount of movement (displacement) of parts from the normal position.   Type 1 Fractures   Type 1 fractures are generally small fractures in which bone pieces remain together (non-displaced fracture).   The fracture may not be seen on initial X-rays. Usually if x-rays are repeated two to three weeks later, the fracture will show up. A splint or sling is used for a few days. Gentle early motion is used to prevent the elbow from becoming stiff. It should not be done vigorously or forced as this could displace the bone pieces.  Type 2 Fractures   With type 2 fractures, bone pieces are slightly displaced and larger pieces of bone are broken off.   If only a little displacement of the bone piece is present, splinting for 4 to 5 days usually works well. This is again followed with gentle active range of motion. Small fragments may be surgically removed.   Large pieces of bone that can be put back into place will sometimes be fixed with pins or screws to hold them until the bone is healed. If this cannot be done, the fragments are removed. For older, less active people, sometimes the entire radial  head is removed if the wrist is not injured. The elbow and arm will still work fine. Soft tissue, tendon, and ligament injuries are corrected at the same time.  Type 3 Fractures   Type 3 fractures have multiple broken pieces of bone which cannot be fixed. Surgery is usually needed to remove the broken bits of bone and what is left of the radial head. Soft-tissue damage is repaired. Gentle early motion is used to prevent the elbow from becoming stiff. Sometimes an artificial radial head can be used to prevent deformity if elbow is instable.  Rest, ice, elevation, immobilization, medications, and pain control are used in the early care.  HOME CARE INSTRUCTIONS    Keep the injured part elevated while sitting or lying down. Keep the injury above the level of your heart (the center of the chest). This will decrease swelling and pain.   Apply ice to the injury for 15 to 20 minutes, 3 to 4 times per day while awake, for 2 days. Put the ice in a plastic bag and place a towel between the bag of ice and your cast or splint.   Move your fingers to avoid stiffness and minimize swelling.   If you have a plaster or fiberglass cast:   Do not try to scratch the skin under the cast using sharp or pointed objects.   Check the skin   around the cast every day. You may put lotion on any red or sore areas.   Keep your cast dry and clean.   If you have a plaster splint:   Wear the splint as directed.   You may loosen the elastic around the splint if your fingers become numb, tingle, or turn cold or blue.   Do not put pressure on any part of your cast or splint. It may break. Rest your cast only on a pillow for the first 24 hours until it is fully hardened.   Your cast or splint can be protected during bathing with a plastic bag. Do not lower the cast or splint into water.   Only take over-the-counter or prescription medicines for pain, discomfort, or fever as directed by your caregiver.   Follow all instructions for follow  up with your caregiver. This includes any orthopedic referrals, physical therapy and rehabilitation. Any delay in obtaining necessary care could result in a delay or failure of the bones to heal or permanent elbow stiffness.   Do not over do exercises. This could further damage your injury.  SEEK IMMEDIATE MEDICAL CARE IF:    Your cast or splint gets damaged or breaks.   You have more severe pain or swelling than you did before getting the cast.   You have severe pain when stretching your fingers.   There is a bad smell, new stains and/or pus-like (purulent) drainage coming from under the cast.   Your fingers or hand turn pale or blue, become cold, or you lose feeling.  Document Released: 08/24/2006 Document Revised: 01/25/2012 Document Reviewed: 10/01/2009  [employer] Patient Information 2013 [employer]

## 2013-01-16 ENCOUNTER — Encounter (HOSPITAL_BASED_OUTPATIENT_CLINIC_OR_DEPARTMENT_OTHER): Payer: Self-pay | Admitting: *Deleted

## 2013-01-16 ENCOUNTER — Other Ambulatory Visit: Payer: Self-pay | Admitting: Orthopedic Surgery

## 2013-01-17 ENCOUNTER — Encounter (HOSPITAL_BASED_OUTPATIENT_CLINIC_OR_DEPARTMENT_OTHER): Payer: Self-pay | Admitting: *Deleted

## 2013-01-17 ENCOUNTER — Encounter (HOSPITAL_BASED_OUTPATIENT_CLINIC_OR_DEPARTMENT_OTHER)
Admission: RE | Disposition: A | Discharge status: Home / Self Care | Payer: Self-pay | Source: Ambulatory Visit | Attending: Orthopedic Surgery

## 2013-01-17 ENCOUNTER — Ambulatory Visit (HOSPITAL_BASED_OUTPATIENT_CLINIC_OR_DEPARTMENT_OTHER)
Admission: RE | Admit: 2013-01-17 | Discharge: 2013-01-17 | Disposition: A | Discharge status: Home / Self Care | Payer: 59 | Source: Ambulatory Visit | Attending: Orthopedic Surgery | Admitting: Orthopedic Surgery

## 2013-01-17 ENCOUNTER — Ambulatory Visit (HOSPITAL_BASED_OUTPATIENT_CLINIC_OR_DEPARTMENT_OTHER): Payer: 59 | Admitting: *Deleted

## 2013-01-17 DIAGNOSIS — S52009A Unspecified fracture of upper end of unspecified ulna, initial encounter for closed fracture: Secondary | ICD-10-CM | POA: Insufficient documentation

## 2013-01-17 HISTORY — DX: Unspecified fracture of lower end of unspecified humerus, initial encounter for closed fracture: S42.409A

## 2013-01-17 HISTORY — DX: Sleep apnea, unspecified: G47.30

## 2013-01-17 HISTORY — DX: Gastro-esophageal reflux disease without esophagitis: K21.9

## 2013-01-17 HISTORY — PX: RADIAL HEAD ARTHROPLASTY: SHX6044

## 2013-01-17 LAB — POCT HEMOGLOBIN-HEMACUE: Hemoglobin: 13.8 g/dL (ref 12.0–15.0)

## 2013-01-17 SURGERY — ARTHROPLASTY, RADIUS, HEAD
Anesthesia: Regional | Site: Elbow | Laterality: Right | Wound class: Clean

## 2013-01-17 MED ORDER — MIDAZOLAM HCL 2 MG/2ML IJ SOLN
1.0000 mg | INTRAMUSCULAR | Status: DC | PRN
  Administered 2013-01-17: 2 mg via INTRAVENOUS

## 2013-01-17 MED ORDER — LIDOCAINE HCL (CARDIAC) 20 MG/ML IV SOLN
INTRAVENOUS | Status: DC | PRN
  Administered 2013-01-17: 100 mg via INTRAVENOUS

## 2013-01-17 MED ORDER — FENTANYL CITRATE 0.05 MG/ML IJ SOLN
50.0000 ug | INTRAMUSCULAR | Status: DC | PRN
  Administered 2013-01-17: 100 ug via INTRAVENOUS

## 2013-01-17 MED ORDER — ONDANSETRON HCL 4 MG/2ML IJ SOLN: 4.0000 mg | Freq: Once | INTRAMUSCULAR | Status: DC | PRN

## 2013-01-17 MED ORDER — ONDANSETRON HCL 4 MG/2ML IJ SOLN
INTRAMUSCULAR | Status: DC | PRN
  Administered 2013-01-17: 4 mg via INTRAVENOUS

## 2013-01-17 MED ORDER — MIDAZOLAM HCL 2 MG/ML PO SYRP: 12.0000 mg | ORAL_SOLUTION | Freq: Once | ORAL | Status: DC | PRN

## 2013-01-17 MED ORDER — OXYCODONE-ACETAMINOPHEN 5-325 MG PO TABS: ORAL_TABLET | ORAL | Status: DC

## 2013-01-17 MED ORDER — PROPOFOL 10 MG/ML IV BOLUS
INTRAVENOUS | Status: DC | PRN
  Administered 2013-01-17: 200 mg via INTRAVENOUS

## 2013-01-17 MED ORDER — BUPIVACAINE-EPINEPHRINE PF 0.5-1:200000 % IJ SOLN
INTRAMUSCULAR | Status: DC | PRN
  Administered 2013-01-17: 30 mL

## 2013-01-17 MED ORDER — ACETAMINOPHEN 500 MG PO TABS
1000.0000 mg | ORAL_TABLET | Freq: Once | ORAL | Status: AC | PRN
  Administered 2013-01-17: 1000 mg via ORAL

## 2013-01-17 MED ORDER — PROMETHAZINE HCL 25 MG/ML IJ SOLN
6.2500 mg | INTRAMUSCULAR | Status: DC | PRN
  Administered 2013-01-17: 6.25 mg via INTRAVENOUS

## 2013-01-17 MED ORDER — OXYCODONE HCL 5 MG PO TABS: 5.0000 mg | ORAL_TABLET | Freq: Once | ORAL | Status: DC | PRN

## 2013-01-17 MED ORDER — HYDROMORPHONE HCL PF 1 MG/ML IJ SOLN: 0.2500 mg | INTRAMUSCULAR | Status: DC | PRN

## 2013-01-17 MED ORDER — LACTATED RINGERS IV SOLN
INTRAVENOUS | Status: DC
  Administered 2013-01-17: 20 mL/h via INTRAVENOUS
  Administered 2013-01-17 (×2): via INTRAVENOUS

## 2013-01-17 MED ORDER — DEXAMETHASONE SODIUM PHOSPHATE 10 MG/ML IJ SOLN
INTRAMUSCULAR | Status: DC | PRN
  Administered 2013-01-17: 5 mg via INTRAVENOUS

## 2013-01-17 MED ORDER — FENTANYL CITRATE 0.05 MG/ML IJ SOLN
INTRAMUSCULAR | Status: DC | PRN
  Administered 2013-01-17: 50 ug via INTRAVENOUS

## 2013-01-17 MED ORDER — ACETAMINOPHEN 325 MG PO TABS: 975.0000 mg | ORAL_TABLET | Freq: Once | ORAL | Status: DC | PRN

## 2013-01-17 MED ORDER — OXYCODONE HCL 5 MG/5ML PO SOLN: 5.0000 mg | Freq: Once | ORAL | Status: DC | PRN

## 2013-01-17 MED ORDER — CEFAZOLIN SODIUM-DEXTROSE 2-3 GM-% IV SOLR
2.0000 g | INTRAVENOUS | Status: AC
  Administered 2013-01-17: 2 g via INTRAVENOUS

## 2013-01-17 MED ORDER — MIDAZOLAM HCL 5 MG/5ML IJ SOLN
INTRAMUSCULAR | Status: DC | PRN
  Administered 2013-01-17: 1 mg via INTRAVENOUS

## 2013-01-17 MED ORDER — MEPERIDINE HCL 25 MG/ML IJ SOLN: 6.2500 mg | INTRAMUSCULAR | Status: DC | PRN

## 2013-01-17 MED ORDER — CHLORHEXIDINE GLUCONATE 4 % EX LIQD: 60.0000 mL | Freq: Once | CUTANEOUS | Status: DC

## 2013-01-17 SURGICAL SUPPLY — 81 items
BAG DECANTER FOR FLEXI CONT (MISCELLANEOUS) IMPLANT
BANDAGE ELASTIC 3 VELCRO ST LF (GAUZE/BANDAGES/DRESSINGS) ×2 IMPLANT
BANDAGE ELASTIC 4 VELCRO ST LF (GAUZE/BANDAGES/DRESSINGS) ×2 IMPLANT
BANDAGE GAUZE ELAST BULKY 4 IN (GAUZE/BANDAGES/DRESSINGS) ×4 IMPLANT
BLADE AVERAGE 25X9 (BLADE) ×2 IMPLANT
BLADE MINI RND TIP GREEN BEAV (BLADE) ×2 IMPLANT
BLADE SURG 15 STRL LF DISP TIS (BLADE) ×2 IMPLANT
BLADE SURG 15 STRL SS (BLADE) ×4
BNDG CMPR 9X4 STRL LF SNTH (GAUZE/BANDAGES/DRESSINGS) ×1
BNDG ESMARK 4X9 LF (GAUZE/BANDAGES/DRESSINGS) ×2 IMPLANT
BUR EGG 3PK/BX (BURR) IMPLANT
CANISTER SUCTION 1200CC (MISCELLANEOUS) ×2 IMPLANT
CHLORAPREP W/TINT 26ML (MISCELLANEOUS) ×2 IMPLANT
CLOTH BEACON ORANGE TIMEOUT ST (SAFETY) ×2 IMPLANT
CORDS BIPOLAR (ELECTRODE) ×2 IMPLANT
COVER MAYO STAND STRL (DRAPES) ×2 IMPLANT
COVER TABLE BACK 60X90 (DRAPES) ×2 IMPLANT
CUFF TOURNIQUET SINGLE 18IN (TOURNIQUET CUFF) ×1 IMPLANT
CUFF TOURNIQUET SINGLE 24IN (TOURNIQUET CUFF) ×1 IMPLANT
DECANTER SPIKE VIAL GLASS SM (MISCELLANEOUS) IMPLANT
DRAIN TLS ROUND 10FR (DRAIN) IMPLANT
DRAPE EXTREMITY T 121X128X90 (DRAPE) ×2 IMPLANT
DRAPE OEC MINIVIEW 54X84 (DRAPES) ×2 IMPLANT
DRAPE SURG 17X23 STRL (DRAPES) ×2 IMPLANT
DRSG PAD ABDOMINAL 8X10 ST (GAUZE/BANDAGES/DRESSINGS) ×2 IMPLANT
GAUZE SPONGE 4X4 16PLY XRAY LF (GAUZE/BANDAGES/DRESSINGS) IMPLANT
GAUZE XEROFORM 1X8 LF (GAUZE/BANDAGES/DRESSINGS) ×2 IMPLANT
GLOVE BIO SURGEON STRL SZ7.5 (GLOVE) ×1 IMPLANT
GLOVE BIOGEL PI IND STRL 8 (GLOVE) ×1 IMPLANT
GLOVE BIOGEL PI IND STRL 8.5 (GLOVE) ×1 IMPLANT
GLOVE BIOGEL PI INDICATOR 8 (GLOVE) ×1
GLOVE BIOGEL PI INDICATOR 8.5 (GLOVE) ×1
GLOVE INDICATOR 6.5 STRL GRN (GLOVE) ×1 IMPLANT
GLOVE INDICATOR 7.0 STRL GRN (GLOVE) ×1 IMPLANT
GLOVE SKINSENSE NS SZ6.5 (GLOVE) ×2
GLOVE SKINSENSE NS SZ7.5 (GLOVE) ×1
GLOVE SKINSENSE NS SZ8.0 LF (GLOVE) ×1
GLOVE SKINSENSE STRL SZ6.5 (GLOVE) IMPLANT
GLOVE SKINSENSE STRL SZ7.5 (GLOVE) IMPLANT
GLOVE SKINSENSE STRL SZ8.0 LF (GLOVE) IMPLANT
GLOVE SURG ORTHO 8.0 STRL STRW (GLOVE) ×1 IMPLANT
GLOVE SURG SS PI 8.5 STRL IVOR (GLOVE) ×1
GLOVE SURG SS PI 8.5 STRL STRW (GLOVE) ×1 IMPLANT
GOWN BRE IMP PREV XXLGXLNG (GOWN DISPOSABLE) ×3 IMPLANT
GOWN PREVENTION PLUS XLARGE (GOWN DISPOSABLE) ×3 IMPLANT
GOWN PREVENTION PLUS XXLARGE (GOWN DISPOSABLE) ×2 IMPLANT
GUIDEWIRE ORTHO 0.054X6 (WIRE) ×1 IMPLANT
GUIDEWIRE ORTHO MINI ACTK .045 (WIRE) ×1 IMPLANT
HEAD RADIAL RIGHT 20MM (Head) ×1 IMPLANT
NS IRRIG 1000ML POUR BTL (IV SOLUTION) ×2 IMPLANT
PACK BASIN DAY SURGERY FS (CUSTOM PROCEDURE TRAY) ×2 IMPLANT
PAD CAST 3X4 CTTN HI CHSV (CAST SUPPLIES) ×1 IMPLANT
PAD CAST 4YDX4 CTTN HI CHSV (CAST SUPPLIES) ×1 IMPLANT
PADDING CAST ABS 3INX4YD NS (CAST SUPPLIES) ×1
PADDING CAST ABS 4INX4YD NS (CAST SUPPLIES) ×1
PADDING CAST ABS COTTON 3X4 (CAST SUPPLIES) ×1 IMPLANT
PADDING CAST ABS COTTON 4X4 ST (CAST SUPPLIES) ×1 IMPLANT
PADDING CAST COTTON 3X4 STRL (CAST SUPPLIES) ×2
PADDING CAST COTTON 4X4 STRL (CAST SUPPLIES) ×2
SLEEVE SCD COMPRESS KNEE MED (MISCELLANEOUS) ×1 IMPLANT
SPLINT PLASTER CAST XFAST 3X15 (CAST SUPPLIES) IMPLANT
SPLINT PLASTER XTRA FASTSET 3X (CAST SUPPLIES)
SPONGE GAUZE 4X4 12PLY (GAUZE/BANDAGES/DRESSINGS) ×2 IMPLANT
SPONGE LAP 4X18 X RAY DECT (DISPOSABLE) ×4 IMPLANT
STAPLER VISISTAT (STAPLE) IMPLANT
STEM RADIAL HEAD 6.0X4.0 (Stem) ×1 IMPLANT
STOCKINETTE 4X48 STRL (DRAPES) ×2 IMPLANT
SUCTION FRAZIER TIP 10 FR DISP (SUCTIONS) IMPLANT
SUT ETHILON 3 0 PS 1 (SUTURE) IMPLANT
SUT ETHILON 4 0 PS 2 18 (SUTURE) IMPLANT
SUT VIC AB 0 SH 27 (SUTURE) ×2 IMPLANT
SUT VIC AB 2-0 SH 27 (SUTURE)
SUT VIC AB 2-0 SH 27XBRD (SUTURE) IMPLANT
SUT VICRYL 4-0 PS2 18IN ABS (SUTURE) IMPLANT
SYR BULB 3OZ (MISCELLANEOUS) ×2 IMPLANT
SYR CONTROL 10ML LL (SYRINGE) ×2 IMPLANT
TOWEL OR 17X24 6PK STRL BLUE (TOWEL DISPOSABLE) ×2 IMPLANT
TUBE ANAEROBIC SPECIMEN COL (MISCELLANEOUS) IMPLANT
TUBE CONNECTING 20X1/4 (TUBING) IMPLANT
UNDERPAD 30X30 INCONTINENT (UNDERPADS AND DIAPERS) ×2 IMPLANT
WATER STERILE IRR 1000ML POUR (IV SOLUTION) ×2 IMPLANT

## 2013-01-17 NOTE — Anesthesia Procedure Notes (Addendum)
Anesthesia Regional Block:  Supraclavicular block  Pre-Anesthetic Checklist: ,, timeout performed, Correct Patient, Correct Site, Correct Laterality, Correct Procedure, Correct Position, site marked, Risks and benefits discussed,  Surgical consent,  Pre-op evaluation,  At surgeon's request and post-op pain management  Laterality: Right  Prep: chloraprep       Needles:  Injection technique: Single-shot  Needle Type: Echogenic Stimulator Needle     Needle Length: 5cm 5 cm     Additional Needles:  Procedures: ultrasound guided (picture in chart) and nerve stimulator Supraclavicular block  Nerve Stimulator or Paresthesia:  Response: 0.4 mA,   Additional Responses:   Narrative:  Start time: 01/17/2013 10:50 AM End time: 01/17/2013 11:02 AM Injection made incrementally with aspirations every 5 mL.  Performed by: Personally  Anesthesiologist: Arta Bruce MD  Additional Notes: Monitors applied. Patient sedated. Sterile prep and drape,hand hygiene and sterile gloves were used. Relevant anatomy identified.Needle position confirmed.Local anesthetic injected incrementally after negative aspiration. Local anesthetic spread visualized around nerve(s). Vascular puncture avoided. No complications. Image printed for medical record.The patient tolerated the procedure well.       Supraclavicular block Procedure Name: LMA Insertion Date/Time: 01/17/2013 11:17 AM Performed by: Suann Larry WOLFE Pre-anesthesia Checklist: Patient identified, Emergency Drugs available, Suction available and Patient being monitored Patient Re-evaluated:Patient Re-evaluated prior to inductionOxygen Delivery Method: Circle System Utilized Preoxygenation: Pre-oxygenation with 100% oxygen Intubation Type: IV induction Ventilation: Mask ventilation without difficulty LMA: LMA inserted LMA Size: 4.0 Number of attempts: 1 Airway Equipment and Method: bite block Placement Confirmation: positive ETCO2 and breath  sounds checked- equal and bilateral Tube secured with: Tape Dental Injury: Teeth and Oropharynx as per pre-operative assessment

## 2013-01-17 NOTE — Op Note (Signed)
[id number] 

## 2013-01-17 NOTE — Anesthesia Postprocedure Evaluation (Signed)
  Anesthesia Post-op Note  Patient: [name]  Procedure(s) Performed: Procedure(s): RIGHT RADIAL HEAD ARTHROPLASTY OPEN REDUCTION INTERNAL FIXATION CORONOID  (Right)  Patient Location: PACU  Anesthesia Type:GA combined with regional for post-op pain  Level of Consciousness: awake, alert  and oriented  Airway and Oxygen Therapy: Patient Spontanous Breathing  Post-op Pain: none  Post-op Assessment: Post-op Vital signs reviewed, Patient's Cardiovascular Status Stable, Respiratory Function Stable, Patent Airway and No signs of Nausea or vomiting  Post-op Vital Signs: Reviewed and stable  Complications: No apparent anesthesia complications

## 2013-01-17 NOTE — Transfer of Care (Signed)
Immediate Anesthesia Transfer of Care Note  Patient: [name]  Procedure(s) Performed: Procedure(s): RIGHT RADIAL HEAD ARTHROPLASTY OPEN REDUCTION INTERNAL FIXATION CORONOID  (Right)  Patient Location: PACU  Anesthesia Type:GA combined with regional for post-op pain  Level of Consciousness: awake, oriented and patient cooperative  Airway & Oxygen Therapy: Patient Spontanous Breathing and Patient connected to face mask oxygen  Post-op Assessment: Report given to PACU RN, Post -op Vital signs reviewed and stable and Patient moving all extremities  Post vital signs: Reviewed and stable  Complications: No apparent anesthesia complications

## 2013-01-17 NOTE — Progress Notes (Signed)
Assisted Dr. Ossey with right, ultrasound guided, supraclavicular block. Side rails up, monitors on throughout procedure. See vital signs in flow sheet. Tolerated Procedure well. 
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
## 2013-01-17 NOTE — Anesthesia Preprocedure Evaluation (Addendum)
Anesthesia Evaluation  Patient identified by MRN, date of birth, ID band Patient awake    Reviewed: Allergy & Precautions, H&P , NPO status , Patient's Chart, lab work & pertinent test results  Airway Mallampati: II TM Distance: >3 FB Neck ROM: Full    Dental   Pulmonary sleep apnea and Continuous Positive Airway Pressure Ventilation ,          Cardiovascular     Neuro/Psych    GI/Hepatic GERD-  Medicated and Controlled,  Endo/Other    Renal/GU      Musculoskeletal   Abdominal   Peds  Hematology   Anesthesia Other Findings   Reproductive/Obstetrics                           Anesthesia Physical Anesthesia Plan  ASA: III  Anesthesia Plan: General   Post-op Pain Management:    Induction: Intravenous  Airway Management Planned: LMA  Additional Equipment:   Intra-op Plan:   Post-operative Plan: Extubation in OR  Informed Consent: I have reviewed the patients History and Physical, chart, labs and discussed the procedure including the risks, benefits and alternatives for the proposed anesthesia with the patient or authorized representative who has indicated his/her understanding and acceptance.     Plan Discussed with: CRNA and Surgeon  Anesthesia Plan Comments:         Anesthesia Quick Evaluation

## 2013-01-17 NOTE — Brief Op Note (Signed)
01/17/2013  1:35 PM  PATIENT:  [name]  42 y.o. female  PRE-OPERATIVE DIAGNOSIS:  right elbow fracture/ dislocation   POST-OPERATIVE DIAGNOSIS:  right elbow fracture/dislocation  PROCEDURE:  Procedure(s): RIGHT RADIAL HEAD ARTHROPLASTY OPEN REDUCTION INTERNAL FIXATION CORONOID  (Right)  SURGEON:  Surgeon(s) and Role:    * Tami Ribas, MD - Primary    * Nicki Reaper, MD - Assisting  PHYSICIAN ASSISTANT:   ASSISTANTS: Cindee Salt, MD   ANESTHESIA:   regional and general  EBL:  Total I/O In: 1750 [I.V.:1750] Out: -   BLOOD ADMINISTERED:none  DRAINS: none   LOCAL MEDICATIONS USED:  NONE  SPECIMEN:  No Specimen  DISPOSITION OF SPECIMEN:  N/A  COUNTS:  YES  TOURNIQUET:   Total Tourniquet Time Documented: Upper Arm (Right) - 115 minutes Total: Upper Arm (Right) - 115 minutes   DICTATION: .Other Dictation: Dictation Number [id number]  PLAN OF CARE: Discharge to home after PACU  PATIENT DISPOSITION:  PACU - hemodynamically stable.
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
## 2013-01-23 ENCOUNTER — Encounter (HOSPITAL_BASED_OUTPATIENT_CLINIC_OR_DEPARTMENT_OTHER): Payer: Self-pay | Admitting: Orthopedic Surgery

## 2013-05-05 ENCOUNTER — Ambulatory Visit (INDEPENDENT_AMBULATORY_CARE_PROVIDER_SITE_OTHER): Payer: 59 | Admitting: Family Medicine

## 2013-05-05 ENCOUNTER — Encounter: Payer: Self-pay | Admitting: Family Medicine

## 2013-05-05 VITALS — BP 122/88 | HR 84 | Temp 97.7°F | Resp 18 | Ht 62.5 in | Wt 240.0 lb

## 2013-05-05 DIAGNOSIS — M7989 Other specified soft tissue disorders: Secondary | ICD-10-CM

## 2013-05-05 DIAGNOSIS — E669 Obesity, unspecified: Secondary | ICD-10-CM

## 2013-05-05 MED ORDER — FUROSEMIDE 20 MG PO TABS: 20.0000 mg | ORAL_TABLET | Freq: Every day | ORAL | Status: DC | PRN

## 2013-05-05 NOTE — Patient Instructions (Addendum)
Start the lasix daily as needed Labs to be done for BNP F/U as needed

## 2013-05-06 LAB — COMPREHENSIVE METABOLIC PANEL
ALT: 17 U/L (ref 0–35)
CO2: 25 mEq/L (ref 19–32)
Calcium: 9.5 mg/dL (ref 8.4–10.5)
Chloride: 103 mEq/L (ref 96–112)
Creat: 0.61 mg/dL (ref 0.50–1.10)
Glucose, Bld: 78 mg/dL (ref 70–99)
Total Protein: 6.8 g/dL (ref 6.0–8.3)

## 2013-05-06 LAB — BRAIN NATRIURETIC PEPTIDE: Brain Natriuretic Peptide: 7.4 pg/mL (ref 0.0–100.0)

## 2013-05-07 ENCOUNTER — Encounter: Payer: Self-pay | Admitting: Family Medicine

## 2013-05-07 DIAGNOSIS — E669 Obesity, unspecified: Secondary | ICD-10-CM | POA: Insufficient documentation

## 2013-05-07 DIAGNOSIS — Z6841 Body Mass Index (BMI) 40.0 and over, adult: Secondary | ICD-10-CM | POA: Insufficient documentation

## 2013-05-07 DIAGNOSIS — M7989 Other specified soft tissue disorders: Secondary | ICD-10-CM | POA: Insufficient documentation

## 2013-05-07 NOTE — Progress Notes (Signed)
  Subjective:    Patient ID: [name], female    DOB: 11-Jun-1971, 42 y.o.   MRN: [id number]  HPI  Pt here with bilateral leg swelling on and off past few months. No history of CHF, HTN, or liver disease. No current medications. Has been using OTC diuretics with little improvement. Feet and legs feet tight during the day. 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 Denies any SOB or chest pain. Swelling does go down some in morning.    Review of Systems - per above   GEN- denies fatigue, fever, weight loss,weakness, recent illness HEENT- denies eye drainage, change in vision, nasal discharge, CVS- denies chest pain, palpitations RESP- denies SOB, cough, wheeze MSK- denies joint pain, muscle aches, injury Neuro- denies headache, dizziness, syncope, seizure activity      Objective:   Physical Exam  GEN- NAD, alert and oriented x3 HEENT- PERRL, EOMI, non injected sclera, pink conjunctiva, MMM, oropharynx clear Neck- Supple, no JVD CVS- RRR, no murmur RESP-CTAB ABD-NABS,soft,NT,ND, No HSM EXT-  bilat +Pitting  Edema to shin 1+ Pulses- Radial, DP- 2+       Assessment & Plan:

## 2013-05-07 NOTE — Assessment & Plan Note (Signed)
Discussed weight loss, increase activity

## 2013-05-07 NOTE — Assessment & Plan Note (Addendum)
bilat pitting edema, likley some pooling in regards to her standing and obesity Will start lasix, check BMP and metabolic panel ,previous labs show normal renal, liver and Thyroid function Advised compression hose Will recheck may need echo if no improvement

## 2013-07-13 ENCOUNTER — Encounter: Payer: Self-pay | Admitting: Physician Assistant

## 2013-07-13 ENCOUNTER — Ambulatory Visit (INDEPENDENT_AMBULATORY_CARE_PROVIDER_SITE_OTHER): Payer: 59 | Admitting: Physician Assistant

## 2013-07-13 VITALS — BP 128/94 | HR 88 | Temp 98.1°F | Resp 18 | Ht 62.0 in | Wt 237.0 lb

## 2013-07-13 DIAGNOSIS — R002 Palpitations: Secondary | ICD-10-CM

## 2013-07-14 NOTE — Progress Notes (Signed)
   Patient ID: [name] MRN: [id number], DOB: July 28, 1971, 42 y.o. Date of Encounter: 07/14/2013, 9:04 AM    Chief Complaint:  Chief Complaint  Patient presents with  . c/o feeling heart palpitations     HPI: 42 y.o. year old white female was sitting at her desk working just a few minutes ago when she felt palpitations. Would feel one palpitation, then feel like back to normal, then another palpitation. Had a co-worker check her pulse-they also felt that pulse was regular then an ectopic beat, then back to regular.  She had no associated chest pressure, heaviness, tightness, squeezing. No discomfort in her neck or arm. No nausea or diaphoresis.  She drank some coffee this morning but she says "no more than usual". No other caffeine consumption this morning. Taking no decongestants. She has not taken lasix in > 1 week because she has had no swelling recently. She only tkes it prn swelling.   Home Meds: See attached medication section for any medications that were entered at today's visit. The computer does not put those onto this list.The following list is a list of meds entered prior to today's visit.   Current Outpatient Prescriptions on File Prior to Visit  Medication Sig Dispense Refill  . furosemide (LASIX) 20 MG tablet Take 1 tablet (20 mg total) by mouth daily as needed.  30 tablet  3   No current facility-administered medications on file prior to visit.    Allergies:  Allergies  Allergen Reactions  . Adhesive [Tape] Rash  . Latex Rash      Review of Systems: See HPI for pertinent ROS. All other ROS negative.    Physical Exam: Blood pressure 128/94, pulse 88, temperature 98.1 F (36.7 C), temperature source Oral, resp. rate 18, height 5\' 2"  (1.575 m), weight 237 lb (107.502 kg)., Body mass index is 43.34 kg/(m^2). General:  WF. Appears in no acute distress. Neck: Supple. No thyromegaly. No lymphadenopathy. Lungs: Clear bilaterally to auscultation without  wheezes, rales, or rhonchi. Breathing is unlabored. Heart: Regular rhythm with occasional ectopy. No murmurs, rubs, or gallops. Abdomen: Soft, non-tender, non-distended with normoactive bowel sounds. No hepatomegaly. No rebound/guarding. No obvious abdominal masses. Msk:  Strength and tone normal for age. Extremities/Skin: Warm and dry. No clubbing or cyanosis. No edema. No rashes or suspicious lesions. Neuro: Alert and oriented X 3. Moves all extremities spontaneously. Gait is normal. CNII-XII grossly in tact. Psych:  Responds to questions appropriately with a normal affect.     ASSESSMENT AND PLAN:  42 y.o. year old female with  1. Palpitations EKG shows NSR with occasional PACs. Discussed with her this is benign. Discussed decreasing/stoppin caffeine. Discussed if they are bothering her or if she develops associated SOB/presyncope etc secondary to them then could add Cardizem or Beta Blocker. Seh defers Rx at this time. Will monitor. Reassured her.  - EKG 12-Lead   Signed, [address] [place], [place], [facility] 07/14/2013 9:04 AM

## 2013-07-21 ENCOUNTER — Ambulatory Visit: Payer: 59 | Admitting: Family Medicine

## 2013-07-21 DIAGNOSIS — Z23 Encounter for immunization: Secondary | ICD-10-CM

## 2013-07-31 ENCOUNTER — Telehealth: Payer: Self-pay | Admitting: Family Medicine

## 2013-07-31 MED ORDER — CIPROFLOXACIN-HYDROCORTISONE 0.2-1 % OT SUSP: 4.0000 [drp] | Freq: Two times a day (BID) | OTIC | Status: DC

## 2013-07-31 NOTE — Telephone Encounter (Signed)
Patient C/O right outer ear pain.  Per provider Cipro Otic drops sent to pharmacy.  Pt aware.

## 2013-08-01 ENCOUNTER — Telehealth: Payer: Self-pay | Admitting: Family Medicine

## 2013-08-01 MED ORDER — NEOMYCIN-POLYMYXIN-HC 1 % OT SOLN: 3.0000 [drp] | Freq: Four times a day (QID) | OTIC | Status: DC

## 2013-08-01 NOTE — Telephone Encounter (Signed)
Pt in office, Ciprodex called in copay is $50 unable to afford Will call in cortisporin drops instead

## 2013-08-28 ENCOUNTER — Encounter (INDEPENDENT_AMBULATORY_CARE_PROVIDER_SITE_OTHER): Payer: 59 | Admitting: Family Medicine

## 2013-08-28 ENCOUNTER — Ambulatory Visit (INDEPENDENT_AMBULATORY_CARE_PROVIDER_SITE_OTHER): Payer: 59 | Admitting: Family Medicine

## 2013-08-28 ENCOUNTER — Encounter: Payer: Self-pay | Admitting: Family Medicine

## 2013-08-28 VITALS — BP 140/90 | HR 90 | Temp 97.0°F | Resp 24

## 2013-08-28 DIAGNOSIS — J45901 Unspecified asthma with (acute) exacerbation: Secondary | ICD-10-CM

## 2013-08-28 DIAGNOSIS — J45909 Unspecified asthma, uncomplicated: Secondary | ICD-10-CM

## 2013-08-28 MED ORDER — ALBUTEROL SULFATE (2.5 MG/3ML) 0.083% IN NEBU: 2.5000 mg | INHALATION_SOLUTION | Freq: Four times a day (QID) | RESPIRATORY_TRACT | Status: DC | PRN

## 2013-08-28 MED ORDER — PREDNISONE 20 MG PO TABS: ORAL_TABLET | ORAL | Status: DC

## 2013-08-28 MED ORDER — IPRATROPIUM BROMIDE 0.02 % IN SOLN
0.5000 mg | Freq: Once | RESPIRATORY_TRACT | Status: AC
  Administered 2013-08-28: 0.5 mg via RESPIRATORY_TRACT

## 2013-08-28 MED ORDER — ALBUTEROL SULFATE HFA 108 (90 BASE) MCG/ACT IN AERS: 2.0000 | INHALATION_SPRAY | Freq: Four times a day (QID) | RESPIRATORY_TRACT | Status: DC | PRN

## 2013-08-28 MED ORDER — METHYLPREDNISOLONE ACETATE 80 MG/ML IJ SUSP
80.0000 mg | Freq: Once | INTRAMUSCULAR | Status: AC
  Administered 2013-08-28: 80 mg via INTRAMUSCULAR
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
## 2013-08-29 NOTE — Progress Notes (Signed)
This encounter was created in error - please disregard.

## 2013-11-20 ENCOUNTER — Other Ambulatory Visit: Payer: Self-pay | Admitting: Obstetrics and Gynecology

## 2013-11-20 ENCOUNTER — Other Ambulatory Visit: Payer: Self-pay | Admitting: Family Medicine

## 2013-11-20 DIAGNOSIS — N644 Mastodynia: Secondary | ICD-10-CM

## 2013-11-20 MED ORDER — ALPRAZOLAM 0.25 MG PO TABS: 0.2500 mg | ORAL_TABLET | Freq: Every day | ORAL | Status: DC | PRN

## 2013-11-20 NOTE — Telephone Encounter (Signed)
Medication refilled per protocol. Rx faxed to pharmacy

## 2013-11-23 ENCOUNTER — Other Ambulatory Visit: Payer: Self-pay | Admitting: Obstetrics and Gynecology

## 2013-11-23 ENCOUNTER — Ambulatory Visit
Admission: RE | Admit: 2013-11-23 | Discharge: 2013-11-23 | Disposition: A | Discharge status: Home / Self Care | Payer: 59 | Source: Ambulatory Visit | Attending: Obstetrics and Gynecology | Admitting: Obstetrics and Gynecology

## 2013-11-23 DIAGNOSIS — N644 Mastodynia: Secondary | ICD-10-CM

## 2014-01-04 ENCOUNTER — Encounter: Payer: Self-pay | Admitting: Physician Assistant

## 2014-01-04 ENCOUNTER — Ambulatory Visit (INDEPENDENT_AMBULATORY_CARE_PROVIDER_SITE_OTHER): Payer: 59 | Admitting: Physician Assistant

## 2014-01-04 VITALS — BP 132/70 | HR 84 | Temp 98.0°F | Resp 18 | Ht 62.0 in | Wt 235.0 lb

## 2014-01-04 DIAGNOSIS — F988 Other specified behavioral and emotional disorders with onset usually occurring in childhood and adolescence: Secondary | ICD-10-CM

## 2014-01-04 MED ORDER — AMPHETAMINE-DEXTROAMPHETAMINE 20 MG PO TABS: 20.0000 mg | ORAL_TABLET | Freq: Two times a day (BID) | ORAL | Status: DC

## 2014-01-04 NOTE — Progress Notes (Signed)
Patient ID: BURNELL MATLIN MRN: [id number], DOB: July 07, 1971, 43 y.o. Date of Encounter: 01/04/2014, 3:38 PM    Chief Complaint:  Chief Complaint  Patient presents with  . c/o migraine HA x 3-4 days     HPI: 43 y.o. year old female reports that she has had a dull generalized headache for the past 3 or 4 days. Says it really seems to be over her entire head. Reports no photophobia. Does have some phonophobia.  Says that she had headaches in the past and was told that they were migraines and was treated with Imitrex. However she only ever took one Imitrex because it caused her to feel sweaty, dizzy, and made her "feel funny" so she never took it anymore. Says that she gets a headache occasionally. However they are usually more generalized and  all over. Does not get severe pain behind one eye and does not  develop severe photophobia, phonophobia, nausea or vomiting. His taking ibuprofen 400 mg the last 2 nights. His taken one dose of Tylenol. Does continue to have this dull diffuse headache constant for several days now.  Also scheduled office visit today to discuss options for treatment to help improve attention. She reports that she is having difficulty with focusing and paying attention and completing tasks at her job. Reports that this is causing problems for her at her job. States that her supervisor has met with her and has voiced concern to her. She reports that she starts working on something but then forgets to complete the entire project. Forgets to complete all documentation. Has hard time concentrating, focusing, and staying on task.      Home Meds: See attached medication section for any medications that were entered at today's visit. The computer does not put those onto this list.The following list is a list of meds entered prior to today's visit.   Current Outpatient Prescriptions on File Prior to Visit  Medication Sig Dispense Refill  . albuterol (PROVENTIL HFA;VENTOLIN  HFA) 108 (90 BASE) MCG/ACT inhaler Inhale 2 puffs into the lungs every 6 (six) hours as needed for wheezing.  1 Inhaler  0  . ALPRAZolam (XANAX) 0.25 MG tablet Take 1 tablet (0.25 mg total) by mouth daily as needed for anxiety.  30 tablet  1   No current facility-administered medications on file prior to visit.    Allergies:  Allergies  Allergen Reactions  . Adhesive [Tape] Rash  . Latex Rash      Review of Systems: See HPI for pertinent ROS. All other ROS negative.    Physical Exam: Blood pressure 132/70, pulse 84, temperature 98 F (36.7 C), temperature source Oral, resp. rate 18, height $RemoveBe'5\' 2"'EcjZTLANu$  (1.575 m), weight 235 lb (106.595 kg)., Body mass index is 42.97 kg/(m^2). General: WF. Appears in no acute distress. Neck: Supple. No thyromegaly. No lymphadenopathy. No tenderness with palpation of the neck and scapula area. Lungs: Clear bilaterally to auscultation without wheezes, rales, or rhonchi. Breathing is unlabored. Heart: Regular rhythm. No murmurs, rubs, or gallops. Msk:  Strength and tone normal for age. Extremities/Skin: Warm and dry. Neuro: Alert and oriented X 3. Moves all extremities spontaneously. Gait is normal. CNII-XII grossly in tact. Psych:  Responds to questions appropriately with a normal affect.     ASSESSMENT AND PLAN:  43 y.o. year old female with  1. ADD (attention deficit disorder) - amphetamine-dextroamphetamine (ADDERALL) 20 MG tablet; Take 1 tablet (20 mg total) by mouth 2 (two) times daily.  Dispense: 60  tablet; Refill: 0 Will try this med. She is to take dose in morning. Take 2nd dose around 12 noon.  Discussed that can cause insomnia so do not take 2nd dose later in day.  Discussed that on days that she is not working and does not need increased focus and attention, she can skip taking the medication on those days. Does Not have to take it on weekends or on holidays/vacation time. Will plan for followup office visit in one month to reassess. Followup  sooner if it causes any adverse effects.  2. Headache: I think her headache is secondary to stress and tension. Discussed medication options. She wants to continue over-the-counter medications. She is to continue Tylenol and ibuprofen but can use these on a more routine basis to try to get rid of this current headache. Follow up if symptoms worsen or she develops new additional symptoms.  Marin Olp [place], [place], [facility] 01/04/2014 3:38 PM

## 2014-01-29 ENCOUNTER — Telehealth: Payer: Self-pay | Admitting: Family Medicine

## 2014-01-29 MED ORDER — HYDROCORTISONE ACETATE 25 MG RE SUPP: 25.0000 mg | Freq: Two times a day (BID) | RECTAL | Status: DC

## 2014-01-29 NOTE — Telephone Encounter (Signed)
Pt called with hemorrhoid problem, using tucks pads with no improvement, blood streaks with stool On stool softner Start anusol suppository if not better, refer to GI

## 2014-02-27 ENCOUNTER — Encounter: Payer: Self-pay | Admitting: Physician Assistant

## 2014-02-28 MED ORDER — FUROSEMIDE 20 MG PO TABS: 20.0000 mg | ORAL_TABLET | Freq: Every day | ORAL | Status: DC

## 2014-02-28 NOTE — Telephone Encounter (Signed)
Medication refilled per protocol. 

## 2014-03-02 ENCOUNTER — Other Ambulatory Visit: Payer: Self-pay | Admitting: Family Medicine

## 2014-03-02 DIAGNOSIS — J020 Streptococcal pharyngitis: Secondary | ICD-10-CM

## 2014-03-02 MED ORDER — AMOXICILLIN 875 MG PO TABS: 875.0000 mg | ORAL_TABLET | Freq: Two times a day (BID) | ORAL | Status: DC

## 2014-03-02 NOTE — Progress Notes (Signed)
Patient's throat is erythematous with white exudate.  Rapid strep screen is positive.

## 2014-03-12 ENCOUNTER — Other Ambulatory Visit: Payer: Self-pay | Admitting: Family Medicine

## 2014-03-12 ENCOUNTER — Encounter: Payer: Self-pay | Admitting: Internal Medicine

## 2014-03-12 DIAGNOSIS — K921 Melena: Secondary | ICD-10-CM

## 2014-03-12 DIAGNOSIS — K649 Unspecified hemorrhoids: Secondary | ICD-10-CM

## 2014-03-21 ENCOUNTER — Encounter: Payer: Self-pay | Admitting: Physician Assistant

## 2014-03-21 ENCOUNTER — Ambulatory Visit (INDEPENDENT_AMBULATORY_CARE_PROVIDER_SITE_OTHER): Payer: 59 | Admitting: Physician Assistant

## 2014-03-21 VITALS — BP 134/94 | HR 84 | Temp 97.4°F | Resp 18 | Wt 229.0 lb

## 2014-03-21 DIAGNOSIS — A499 Bacterial infection, unspecified: Secondary | ICD-10-CM

## 2014-03-21 DIAGNOSIS — J988 Other specified respiratory disorders: Secondary | ICD-10-CM

## 2014-03-21 DIAGNOSIS — B9689 Other specified bacterial agents as the cause of diseases classified elsewhere: Secondary | ICD-10-CM

## 2014-03-21 MED ORDER — AZITHROMYCIN 250 MG PO TABS: ORAL_TABLET | ORAL | Status: DC

## 2014-03-21 MED ORDER — PREDNISONE 20 MG PO TABS: ORAL_TABLET | ORAL | Status: DC

## 2014-03-21 NOTE — Progress Notes (Signed)
Patient ID: [name] MRN: [id number], DOB: 1971-02-12, 43 y.o. Date of Encounter: 03/21/2014, 11:13 AM    Chief Complaint:  Chief Complaint  Patient presents with  . Headache  . Cough     HPI: 43 y.o. year old female has had head and chest congestion for about 5 days now. On Monday 03/19/14 she sounded very congested when she would talk--a lot of congestion in head, nose, throat. Now is also having a lot of Cough. Yesterday she had a lot of headache. Initially it seemed to be more up at the top of her head but now it seems to be more of starting near her eyes and going around her entire head. Says that her entire head  does feel like there is a lot of pressure. Also yesterday afternoon at about 4:00 PM she had a sharp pain that only lasted about 5 seconds in her head then it returned to her usual/prior/baseline headache. At about 5:30 she was driving home from work, and she felt sweaty and lightheaded. When she got home she felt exhausted and laid on the couch. This morning when she woke up the headache was still present. Also still with congestion and a cough. Has been taking Aleve sinus and cold. Also Sudafed p.m." pain and pressure". Getting no relief. In regards to the headache: She has had no vision changes. She has had no neurologic changes. No changes in balance or gait. No weakness in any extremity. No slurred speech. She reports that she has had no recent head injury or trauma.     Home Meds: See attached medication section for any medications that were entered at today's visit. The computer does not put those onto this list.The following list is a list of meds entered prior to today's visit.   Current Outpatient Prescriptions on File Prior to Visit  Medication Sig Dispense Refill  . albuterol (PROVENTIL HFA;VENTOLIN HFA) 108 (90 BASE) MCG/ACT inhaler Inhale 2 puffs into the lungs every 6 (six) hours as needed for wheezing.  1 Inhaler  0  . ALPRAZolam (XANAX) 0.25 MG tablet  Take 1 tablet (0.25 mg total) by mouth daily as needed for anxiety.  30 tablet  1  . amoxicillin (AMOXIL) 875 MG tablet Take 1 tablet (875 mg total) by mouth 2 (two) times daily.  20 tablet  0  . amphetamine-dextroamphetamine (ADDERALL) 20 MG tablet Take 1 tablet (20 mg total) by mouth 2 (two) times daily.  60 tablet  0  . furosemide (LASIX) 20 MG tablet Take 1 tablet (20 mg total) by mouth daily. Does take two (40 mg) as needed  30 tablet  5  . hydrocortisone (ANUSOL-HC) 25 MG suppository Place 1 suppository (25 mg total) rectally 2 (two) times daily. For 2 weeks  12 suppository  1  . vitamin E 400 UNIT capsule Take 400 Units by mouth daily.       No current facility-administered medications on file prior to visit.    Allergies:  Allergies  Allergen Reactions  . Adhesive [Tape] Rash  . Latex Rash      Review of Systems: See HPI for pertinent ROS. All other ROS negative.    Physical Exam: Blood pressure 134/94, pulse 84, temperature 97.4 F (36.3 C), temperature source Oral, resp. rate 18, weight 229 lb (103.874 kg)., Body mass index is 41.87 kg/(m^2). General: WF.  Appears in no acute distress. HEENT: Normocephalic, atraumatic, eyes without discharge, sclera non-icteric, nares are without discharge. Bilateral auditory canals  clear, TM's are without perforation, pearly grey and translucent with reflective cone of light bilaterally. Oral cavity moist, posterior pharynx without exudate, erythema, peritonsillar abscess. No tenderness with percussion of frontal and maxillary sinuses.   Neck: Supple. No thyromegaly. No lymphadenopathy. Lungs: Clear bilaterally to auscultation without wheezes, rales, or rhonchi. Breathing is unlabored. Heart: Regular rhythm. No murmurs, rubs, or gallops. Msk:  Strength and tone normal for age. Extremities/Skin: Warm and dry. Neuro: Alert and oriented X 3. Moves all extremities spontaneously. Gait is normal. CNII-XII grossly in tact. Psych:  Responds to  questions appropriately with a normal affect.     ASSESSMENT AND PLAN:  43 y.o. year old female with  1. Bacterial respiratory infection - azithromycin (ZITHROMAX) 250 MG tablet; Day 1: Take 2 daily.  Days 2-5: Take 1 daily.  Dispense: 6 tablet; Refill: 0 - predniSONE (DELTASONE) 20 MG tablet; Take 3 daily for 2 days, then 2 daily for 2 days, then 1 daily for 2 days.  Dispense: 12 tablet; Refill: 0 I think  the headache she is experiencing is secondary to pressure and congestion and inflammation in her head and sinuses. I think the sharp, fleeting pain that she experienced yesterday afternoon was secondary to some inflammation and swelling around a nerve. she is to go ahead and start the prednisone now so can monitor her response to this. If headache does not improve after this then would consider CT scan.  F/U if  headache does not improve with prednisone. F/U if all symptoms do not resolve within one week after completion of antibiotic and prednisone.  Signed, [address] [place], [place], [facility] 03/21/2014 11:13 AM

## 2014-04-06 ENCOUNTER — Other Ambulatory Visit: Payer: Self-pay | Admitting: Physician Assistant

## 2014-04-06 DIAGNOSIS — F988 Other specified behavioral and emotional disorders with onset usually occurring in childhood and adolescence: Secondary | ICD-10-CM

## 2014-04-06 MED ORDER — AMPHETAMINE-DEXTROAMPHETAMINE 20 MG PO TABS
20.0000 mg | ORAL_TABLET | Freq: Two times a day (BID) | ORAL | Status: DC
Start: 2014-04-06 — End: 2014-06-20

## 2014-04-06 NOTE — Telephone Encounter (Signed)
Approved. Please print Rx for her to take to pharmacy.

## 2014-04-06 NOTE — Telephone Encounter (Signed)
rx printed, pt aware

## 2014-04-06 NOTE — Telephone Encounter (Signed)
Ok to refill??  Last office visit for ADD 01/04/2014.   Last office visit 03/24/2014.  Last refill 01/04/2014.

## 2014-05-01 ENCOUNTER — Encounter: Payer: Self-pay | Admitting: Internal Medicine

## 2014-05-02 ENCOUNTER — Encounter: Payer: Self-pay | Admitting: Internal Medicine

## 2014-05-02 ENCOUNTER — Ambulatory Visit (INDEPENDENT_AMBULATORY_CARE_PROVIDER_SITE_OTHER): Payer: 59 | Admitting: Internal Medicine

## 2014-05-02 VITALS — BP 126/88 | HR 92 | Ht 63.0 in | Wt 228.4 lb

## 2014-05-02 DIAGNOSIS — K219 Gastro-esophageal reflux disease without esophagitis: Secondary | ICD-10-CM | POA: Insufficient documentation

## 2014-05-02 DIAGNOSIS — K625 Hemorrhage of anus and rectum: Secondary | ICD-10-CM

## 2014-05-02 DIAGNOSIS — K6289 Other specified diseases of anus and rectum: Secondary | ICD-10-CM

## 2014-05-02 DIAGNOSIS — K602 Anal fissure, unspecified: Secondary | ICD-10-CM | POA: Insufficient documentation

## 2014-05-02 MED ORDER — DILTIAZEM GEL 2 %: CUTANEOUS | Status: DC

## 2014-05-02 MED ORDER — MOVIPREP 100 G PO SOLR: 1.0000 | Freq: Once | ORAL | Status: DC

## 2014-05-02 NOTE — Progress Notes (Signed)
Patient ID: [name], female   DOB: 11/04/1971, 43 y.o.   MRN: [id number] HPI: [name] is a 43 year old female with a past medical history of GERD, sleep apnea, mild asthma, and intermittent headaches was seen in consultation at the request of Karis Juba to evaluate rectal pain and bleeding. She is here alone today. She reports she developed rectal and perianal pain along with bleeding in April 2015. 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 She reports she was examined by Dr. Buelah Manis and was diagnosed with external hemorrhoids, perhaps internal hemorrhoids as well. She was given Anusol suppositories. She has taken several courses of Anusol suppositories which seemed to help a little, but her bleeding and pain have continued. She denies constipation or diarrhea. She does report pelvic pain and pressure which occurs "every now and then" usually before bowel movement. She reports stabbing type pain with passing the stool and also some throbbing after stooling. She denies melena. She does report issues with heartburn which come and go. She also has nausea in the afternoon and evenings almost every day but no vomiting. No nocturnal abdominal pain. She denies epigastric pain. She is please taken PPI but is not using it now. She has a fairly new diagnosis of mild asthma and was treated with prednisone and an inhaler in the past 6 weeks. Trigger for asthma was unclear. She denies a family history of colon polyps or cancer but admits that she has been quite worried about her ongoing rectal bleeding.  She has been told in the past her gallbladder might need to be removed. She recalls a remote HIDA scan which was abnormal. Records indicate this was performed in 2008, and did show mild to moderate biliary dyskinesia.  Past Medical History  Diagnosis Date  . Obesity   . GERD (gastroesophageal reflux disease)     Omeprazole as needed, none in 6 mos.  . Fracture dislocation of elbow joint  01/15/2013    right  . Sleep apnea     uses CPAP "most nights"  . Anxiety   . Asthma     Past Surgical History  Procedure Laterality Date  . Knee arthroscopy Left 1991  . Laparoscopic assisted vaginal hysterectomy  11/02/2006  . Tubal ligation  03/06/2005  . Laparoscopic lysis of adhesions  03/06/2005  . Cesarean section  1998; 10/10/2000    x 2  . Radial head arthroplasty Right 01/17/2013    Procedure: RIGHT RADIAL HEAD ARTHROPLASTY OPEN REDUCTION INTERNAL FIXATION CORONOID ;  Surgeon: Tennis Must, MD;  Location: [facility];  Service: Orthopedics;  Laterality: Right;    Current Outpatient Prescriptions  Medication Sig Dispense Refill  . albuterol (PROVENTIL HFA;VENTOLIN HFA) 108 (90 BASE) MCG/ACT inhaler Inhale 2 puffs into the lungs every 6 (six) hours as needed for wheezing.  1 Inhaler  0  . ALPRAZolam (XANAX) 0.25 MG tablet Take 1 tablet (0.25 mg total) by mouth daily as needed for anxiety.  30 tablet  1  . amphetamine-dextroamphetamine (ADDERALL) 20 MG tablet Take 1 tablet (20 mg total) by mouth 2 (two) times daily.  60 tablet  0  . furosemide (LASIX) 20 MG tablet Take 1 tablet (20 mg total) by mouth daily. Does take two (40 mg) as needed  30 tablet  5  . hydrocortisone (ANUSOL-HC) 25 MG suppository Place 1 suppository (25 mg total) rectally 2 (two) times daily. For 2 weeks  12 suppository  1  . diltiazem 2 %  GEL Apply a small amount to rectum 3 times daily  30 g  0  . esomeprazole (NEXIUM) 40 MG capsule Take 1 capsule (40 mg total) by mouth daily at 12 noon.  1 capsule  0  . MOVIPREP 100 G SOLR Take 1 kit (200 g total) by mouth once.  1 kit  0   No current facility-administered medications for this visit.    Allergies  Allergen Reactions  . Adhesive [Tape] Rash  . Latex Rash    Family History  Problem Relation Age of Onset  . Ovarian cancer Maternal Grandmother   . Diabetes Maternal Grandmother   . Diabetes Paternal Grandmother   . Diabetes Sister   .  Heart disease Father   . Liver disease Paternal Aunt     History  Substance Use Topics  . Smoking status: Never Smoker   . Smokeless tobacco: Never Used  . Alcohol Use: No    ROS: As per history of present illness, otherwise negative  BP 126/88  Pulse 92  Ht _0  (1.6 m)  Wt 228 lb 6 oz (103.59 kg)  BMI 40.46 kg/m2 Constitutional: Well-developed and well-nourished. No distress. HEENT: Normocephalic and atraumatic. Oropharynx is clear and moist. No oropharyngeal exudate. Conjunctivae are normal.  No scleral icterus. Neck: Neck supple. Trachea midline. Cardiovascular: Normal rate, regular rhythm and intact distal pulses. No M/R/G Pulmonary/chest: Effort normal and breath sounds normal. No wheezing, rales or rhonchi. Abdominal: Soft, mild lower tenderness without rebound or guarding, nondistended. Bowel sounds active throughout.  Rectal: External exam only due to pain. Sentinel pile with fissure posteriorly, no external hemorrhoids seen, no perianal fluctuance or drainage Extremities: no clubbing, cyanosis, or edema Lymphadenopathy: No cervical adenopathy noted. Neurological: Alert and oriented to person place and time. Skin: Skin is warm and dry. No rashes noted. Psychiatric: Normal mood and affect. Behavior is normal.  RELEVANT LABS AND IMAGING: CBC    Component Value Date/Time   HGB 13.8 01/17/2013 1101    CMP     Component Value Date/Time   NA 134* 05/05/2013 1646   K 3.6 05/05/2013 1646   CL 103 05/05/2013 1646   CO2 25 05/05/2013 1646   GLUCOSE 78 05/05/2013 1646   BUN 10 05/05/2013 1646   CREATININE 0.61 05/05/2013 1646   CALCIUM 9.5 05/05/2013 1646   PROT 6.8 05/05/2013 1646   ALBUMIN 4.0 05/05/2013 1646   AST 18 05/05/2013 1646   ALT 17 05/05/2013 1646   ALKPHOS 72 05/05/2013 1646   BILITOT 0.3 05/05/2013 1646    ASSESSMENT/PLAN: 43 year old female with a past medical history of GERD, sleep apnea, mild asthma, and intermittent headaches was seen in consultation at  the request of Karis Juba to evaluate rectal pain and bleeding.  1.  Rectal bleeding/pain/anal fissure -- exam consistent with anal fissure. We discussed anal fissure and I recommended diltiazem gel 3 times daily for 4-6 weeks to promote healing. She is not having constipation or hard stool and so I would not add a laxative at this time. She is worried about other pathology and with her lower abdominal pain I have recommended colonoscopy. We discussed the test including the risks and benefits and she is agreeable to proceed. This will exclude other pathology such as inflammation.  2.  GERD with nausea -- she is having heartburn several days a week and also evening nausea. This may be GERD or dyspepsia related. I have recommended omeprazole or Nexium 40 mg once each morning. She reports she  has samples of this medication at home. I have asked that she use it 30 minutes before breakfast daily for one month. After this time we'll reassess her symptoms. It may also help with cough and asthma symptoms if asthma is triggered by reflux.  GERD diet recommended  Further recommendations after procedure

## 2014-05-02 NOTE — Patient Instructions (Signed)
You have been scheduled for a colonoscopy with propofol. Please follow written instructions given to you at your visit today.  Please pick up your prep kit at the pharmacy within the next 1-3 days. If you use inhalers (even only as needed), please bring them with you on the day of your procedure. Your physician has requested that you go to www.startemmi.com and enter the access code given to you at your visit today. This web site gives a general overview about your procedure. However, you should still follow specific instructions given to you by our office regarding your preparation for the procedure.  We have sent the following medications to your pharmacy for you to pick up at your convenience [facility]): Diltiazem Gel-Apply to rectum three times daily x 1 month  Do not use anusol anymore for now.  Please take Nexium daily about 30 minutes before breakfast for nausea, heartburn and cough.  Please follow up with Dr Hilarie Fredrickson on 07/03/14 @ 2:00 pm.  CC: [name], PA-C

## 2014-05-14 ENCOUNTER — Encounter: Payer: Self-pay | Admitting: Internal Medicine

## 2014-05-14 ENCOUNTER — Encounter: Payer: Self-pay | Admitting: Physician Assistant

## 2014-05-14 NOTE — Telephone Encounter (Signed)
Patient wishes to cancel colonoscopy appointment. Please schedule her office followup with me next available and have her notify us if she changes her mind

## 2014-05-15 NOTE — Telephone Encounter (Signed)
I responded to this pt email yesterday by creating an encounter Please refer to yesterday's encounter and let me know if you have questions

## 2014-05-21 ENCOUNTER — Encounter: Payer: 59 | Admitting: Internal Medicine

## 2014-05-29 ENCOUNTER — Encounter: Payer: 59 | Admitting: Internal Medicine

## 2014-06-05 ENCOUNTER — Other Ambulatory Visit: Payer: Self-pay | Admitting: Dermatology

## 2014-06-20 ENCOUNTER — Other Ambulatory Visit: Payer: Self-pay | Admitting: Physician Assistant

## 2014-06-20 DIAGNOSIS — F988 Other specified behavioral and emotional disorders with onset usually occurring in childhood and adolescence: Secondary | ICD-10-CM

## 2014-06-20 MED ORDER — AMPHETAMINE-DEXTROAMPHETAMINE 20 MG PO TABS: 20.0000 mg | ORAL_TABLET | Freq: Two times a day (BID) | ORAL | Status: DC

## 2014-07-03 ENCOUNTER — Ambulatory Visit: Payer: 59 | Admitting: Internal Medicine

## 2014-07-10 ENCOUNTER — Ambulatory Visit: Payer: 59 | Admitting: Internal Medicine

## 2014-07-16 ENCOUNTER — Ambulatory Visit: Payer: 59 | Admitting: Internal Medicine

## 2014-07-18 ENCOUNTER — Ambulatory Visit: Payer: 59 | Admitting: Internal Medicine

## 2014-09-14 ENCOUNTER — Telehealth: Payer: Self-pay | Admitting: Family Medicine

## 2014-09-14 MED ORDER — AMOXICILLIN-POT CLAVULANATE 875-125 MG PO TABS: 1.0000 | ORAL_TABLET | Freq: Two times a day (BID) | ORAL | Status: DC

## 2014-09-14 NOTE — Telephone Encounter (Signed)
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 Left lymph node swelling with left ear pain and draiange, she has had recurrence of this, not treated before   1cm tender left post auricaular node, erythema of canal  Will treat with Augmentin 875mg  BID x 10 days and ibuprofen

## 2014-09-17 ENCOUNTER — Other Ambulatory Visit: Payer: Self-pay | Admitting: Family Medicine

## 2014-09-17 DIAGNOSIS — H60393 Other infective otitis externa, bilateral: Secondary | ICD-10-CM

## 2014-09-17 MED ORDER — CIPROFLOXACIN-DEXAMETHASONE 0.3-0.1 % OT SUSP: 4.0000 [drp] | Freq: Two times a day (BID) | OTIC | Status: DC

## 2014-10-18 ENCOUNTER — Encounter: Payer: Self-pay | Admitting: Physician Assistant

## 2014-10-18 ENCOUNTER — Ambulatory Visit (INDEPENDENT_AMBULATORY_CARE_PROVIDER_SITE_OTHER): Payer: 59 | Admitting: Physician Assistant

## 2014-10-18 VITALS — BP 136/86 | HR 84 | Temp 98.0°F | Resp 18 | Wt 228.0 lb

## 2014-10-18 DIAGNOSIS — M546 Pain in thoracic spine: Secondary | ICD-10-CM

## 2014-10-18 DIAGNOSIS — H6502 Acute serous otitis media, left ear: Secondary | ICD-10-CM

## 2014-10-18 MED ORDER — CEFDINIR 300 MG PO CAPS: 300.0000 mg | ORAL_CAPSULE | Freq: Two times a day (BID) | ORAL | Status: DC

## 2014-10-18 NOTE — Progress Notes (Signed)
Patient ID: ELKY FUNCHES MRN: [id number], DOB: [birth year], 43 y.o. Date of Encounter: _0 @  Chief Complaint:  Chief Complaint  Patient presents with  . c/o mid to lower back back    getting worse, recheck ears after infection    HPI: 43 y.o. year old white female  presents with above complaints.  She reports that she has no prior history of diagnosed problems with her back. Says that she may have some back ache from time to time but that has been her only history up until the past few weeks.  Recently has been having some pain in the midline of her back. She points to approximate T9 area as the area of the pain. She says that this pain has gotten worse over the past 2-3 days. Says that last night when she was sitting next to her son helping him with homework she had to rotate her body in a certain way in order to avoid severe pain in that area. Last night had to take some prescription pain medication in order to sleep secondary to pain in that area. Says that she has had no pain in the sides of her back in that area-- that it's all just midline. She reports that she has been having no sensation of muscle tightness or muscle spasm in paraspinal muscles. Pain stays midline and she has no radiation of the pain to either side.  For her job she sits at a desk but is able to get up and move around and walk around throughout the day. She says that she has done no significant lifting or bending or unusual activity that she can think of to elicit the symptoms.   Also she reports that she recently was treated for ear infection. Says that Friday 09/14/14 she was prescribed Augmentin. On Monday, 09/17/2014 she was also prescribed Ciprodex drops for otitis externa. She says that her ears feel itchy. She says that she still feels like she cannot hear good out of the left ear and that it still feels like there is fluid in the left ear.   Past Medical History  Diagnosis Date  . Obesity    . GERD (gastroesophageal reflux disease)     Omeprazole as needed, none in 6 mos.  . Fracture dislocation of elbow joint 01/15/2013    right  . Sleep apnea     uses CPAP "most nights"  . Anxiety   . Asthma      Home Meds: Outpatient Prescriptions Prior to Visit  Medication Sig Dispense Refill  . albuterol (PROVENTIL HFA;VENTOLIN HFA) 108 (90 BASE) MCG/ACT inhaler Inhale 2 puffs into the lungs every 6 (six) hours as needed for wheezing. 1 Inhaler 0  . ALPRAZolam (XANAX) 0.25 MG tablet Take 1 tablet (0.25 mg total) by mouth daily as needed for anxiety. 30 tablet 1  . amphetamine-dextroamphetamine (ADDERALL) 20 MG tablet Take 1 tablet (20 mg total) by mouth 2 (two) times daily. 60 tablet 0  . diltiazem 2 % GEL Apply a small amount to rectum 3 times daily 30 g 0  . furosemide (LASIX) 20 MG tablet Take 1 tablet (20 mg total) by mouth daily. Does take two (40 mg) as needed 30 tablet 5  . hydrocortisone (ANUSOL-HC) 25 MG suppository Place 1 suppository (25 mg total) rectally 2 (two) times daily. For 2 weeks 12 suppository 1  . esomeprazole (NEXIUM) 40 MG capsule Take 1 capsule (40 mg total) by mouth daily at 12 noon. 1 capsule  0  . MOVIPREP 100 G SOLR Take 1 kit (200 g total) by mouth once. (Patient not taking: Reported on 10/18/2014) 1 kit 0  . amoxicillin-clavulanate (AUGMENTIN) 875-125 MG per tablet Take 1 tablet by mouth 2 (two) times daily. 20 tablet 0  . ciprofloxacin-dexamethasone (CIPRODEX) otic suspension Place 4 drops into both ears 2 (two) times daily. 15 mL 0   No facility-administered medications prior to visit.    Allergies:  Allergies  Allergen Reactions  . Adhesive [Tape] Rash  . Latex Rash    History   Social History  . Marital Status: Married    Spouse Name: N/A    Number of Children: 2  . Years of Education: N/A   Occupational History  . [employer]   Social History Main Topics  . Smoking status: Never Smoker   . Smokeless tobacco: Never Used  . Alcohol  Use: No  . Drug Use: No  . Sexual Activity: Not on file   Other Topics Concern  . Not on file   Social History Narrative    Family History  Problem Relation Age of Onset  . Ovarian cancer Maternal Grandmother   . Diabetes Maternal Grandmother   . Diabetes Paternal Grandmother   . Diabetes Sister   . Heart disease Father   . Liver disease Paternal Aunt      Review of Systems:  See HPI for pertinent ROS. All other ROS negative.    Physical Exam: Blood pressure 136/86, pulse 84, temperature 98 F (36.7 C), temperature source Oral, resp. rate 18, weight 228 lb (103.42 kg)., Body mass index is 40.4 kg/(m^2). General: WF. Appears in no acute distress. Head: Normocephalic, atraumatic, eyes without discharge, sclera non-icteric, nares are without discharge.  Oral cavity moist, posterior pharynx without exudate, erythema, peritonsillar abscess.  Right Ear: distal area of canal is scaly. Remainder of canal normal. TM is normal. Left Ear: Canal is normal. TM is pink and dull.   Neck: Supple. No thyromegaly. No lymphadenopathy. Lungs: Clear bilaterally to auscultation without wheezes, rales, or rhonchi. Breathing is unlabored. Heart: RRR with S1 S2. No murmurs, rubs, or gallops. Musculoskeletal:  Strength and tone normal for age. Points to Midline, at approximate T9 level as area of pain. Normal pain reproduced with palpation of that area. No pain reproduced with palpation of the muscles on each side. Extremities/Skin: Warm and dry. Neuro: Alert and oriented X 3. Moves all extremities spontaneously. Gait is normal. CNII-XII grossly in tact. Psych:  Responds to questions appropriately with a normal affect.     ASSESSMENT AND PLAN:  43 y.o. year old female with  1. Midline thoracic back pain Will obtain x-ray to rule out pathology. Otherwise recommend stretching and strengthening exercises once the pain improves. - DG Thoracic Spine W/Swimmers; Future  2. Acute serous otitis media  of left ear, recurrence not specified Will treat with Omnicef. Recheck ear after completion of Omnicef to make sure exam is normal with completion of abx. - cefdinir (OMNICEF) 300 MG capsule; Take 1 capsule (300 mg total) by mouth 2 (two) times daily.  Dispense: 20 capsule; Refill: 0   Signed, [address] [place], [place], [facility] 10/18/2014 12:31 PM

## 2014-10-19 ENCOUNTER — Ambulatory Visit
Admission: RE | Admit: 2014-10-19 | Discharge: 2014-10-19 | Disposition: A | Discharge status: Home / Self Care | Payer: 59 | Source: Ambulatory Visit | Attending: Physician Assistant | Admitting: Physician Assistant

## 2014-10-19 ENCOUNTER — Other Ambulatory Visit: Payer: Self-pay | Admitting: *Deleted

## 2014-10-19 DIAGNOSIS — M546 Pain in thoracic spine: Secondary | ICD-10-CM

## 2014-10-19 DIAGNOSIS — J45901 Unspecified asthma with (acute) exacerbation: Secondary | ICD-10-CM

## 2014-10-19 MED ORDER — ALBUTEROL SULFATE HFA 108 (90 BASE) MCG/ACT IN AERS: 2.0000 | INHALATION_SPRAY | Freq: Four times a day (QID) | RESPIRATORY_TRACT | Status: DC | PRN

## 2015-03-07 ENCOUNTER — Other Ambulatory Visit: Payer: Self-pay | Admitting: Physician Assistant

## 2015-03-07 DIAGNOSIS — F988 Other specified behavioral and emotional disorders with onset usually occurring in childhood and adolescence: Secondary | ICD-10-CM

## 2015-03-08 ENCOUNTER — Encounter: Payer: Self-pay | Admitting: Family Medicine

## 2015-03-08 ENCOUNTER — Other Ambulatory Visit: Payer: Self-pay | Admitting: Family Medicine

## 2015-03-08 DIAGNOSIS — F988 Other specified behavioral and emotional disorders with onset usually occurring in childhood and adolescence: Secondary | ICD-10-CM

## 2015-03-08 DIAGNOSIS — Z0000 Encounter for general adult medical examination without abnormal findings: Secondary | ICD-10-CM

## 2015-03-08 DIAGNOSIS — E669 Obesity, unspecified: Secondary | ICD-10-CM

## 2015-03-08 DIAGNOSIS — K219 Gastro-esophageal reflux disease without esophagitis: Secondary | ICD-10-CM

## 2015-03-08 DIAGNOSIS — Z79899 Other long term (current) drug therapy: Secondary | ICD-10-CM

## 2015-03-08 MED ORDER — AMPHETAMINE-DEXTROAMPHETAMINE 20 MG PO TABS: 20.0000 mg | ORAL_TABLET | Freq: Two times a day (BID) | ORAL | Status: DC

## 2015-03-11 ENCOUNTER — Other Ambulatory Visit: Payer: 59

## 2015-03-11 DIAGNOSIS — E669 Obesity, unspecified: Secondary | ICD-10-CM

## 2015-03-11 DIAGNOSIS — Z79899 Other long term (current) drug therapy: Secondary | ICD-10-CM

## 2015-03-11 DIAGNOSIS — F988 Other specified behavioral and emotional disorders with onset usually occurring in childhood and adolescence: Secondary | ICD-10-CM

## 2015-03-11 DIAGNOSIS — K219 Gastro-esophageal reflux disease without esophagitis: Secondary | ICD-10-CM

## 2015-03-11 DIAGNOSIS — Z0000 Encounter for general adult medical examination without abnormal findings: Secondary | ICD-10-CM

## 2015-03-11 LAB — CBC WITH DIFFERENTIAL/PLATELET
Basophils Absolute: 0 10*3/uL (ref 0.0–0.1)
Basophils Relative: 0 % (ref 0–1)
Eosinophils Absolute: 0.2 10*3/uL (ref 0.0–0.7)
Eosinophils Relative: 2 % (ref 0–5)
HCT: 42.7 % (ref 36.0–46.0)
HEMOGLOBIN: 14.2 g/dL (ref 12.0–15.0)
Lymphocytes Relative: 50 % — ABNORMAL HIGH (ref 12–46)
Lymphs Abs: 4.3 10*3/uL — ABNORMAL HIGH (ref 0.7–4.0)
MCH: 29.2 pg (ref 26.0–34.0)
MCHC: 33.3 g/dL (ref 30.0–36.0)
MCV: 87.7 fL (ref 78.0–100.0)
MPV: 9.8 fL (ref 8.6–12.4)
Monocytes Absolute: 0.4 10*3/uL (ref 0.1–1.0)
Monocytes Relative: 5 % (ref 3–12)
Neutro Abs: 3.7 10*3/uL (ref 1.7–7.7)
Neutrophils Relative %: 43 % (ref 43–77)
PLATELETS: 348 10*3/uL (ref 150–400)
RBC: 4.87 MIL/uL (ref 3.87–5.11)
RDW: 14 % (ref 11.5–15.5)
WBC: 8.5 10*3/uL (ref 4.0–10.5)

## 2015-03-11 LAB — LIPID PANEL
CHOLESTEROL: 171 mg/dL (ref 0–200)
HDL: 39 mg/dL — AB (ref >=46)
LDL Cholesterol: 110 mg/dL — ABNORMAL HIGH (ref 0–99)
TRIGLYCERIDES: 109 mg/dL (ref <150)
Total CHOL/HDL Ratio: 4.4 Ratio
VLDL: 22 mg/dL (ref 0–40)

## 2015-03-11 LAB — COMPLETE METABOLIC PANEL WITH GFR
ALBUMIN: 3.9 g/dL (ref 3.5–5.2)
ALT: 16 U/L (ref 0–35)
AST: 14 U/L (ref 0–37)
Alkaline Phosphatase: 64 U/L (ref 39–117)
BUN: 12 mg/dL (ref 6–23)
CHLORIDE: 103 meq/L (ref 96–112)
CO2: 27 mEq/L (ref 19–32)
Calcium: 9 mg/dL (ref 8.4–10.5)
Creat: 0.6 mg/dL (ref 0.50–1.10)
GFR, Est African American: >89 mL/min
GFR, Est Non African American: >89 mL/min
Glucose, Bld: 90 mg/dL (ref 70–99)
POTASSIUM: 4.2 meq/L (ref 3.5–5.3)
SODIUM: 138 meq/L (ref 135–145)
Total Bilirubin: 0.3 mg/dL (ref 0.2–1.2)
Total Protein: 6.7 g/dL (ref 6.0–8.3)

## 2015-03-11 LAB — TSH: TSH: 3.21 u[IU]/mL (ref 0.350–4.500)

## 2015-03-14 ENCOUNTER — Ambulatory Visit (INDEPENDENT_AMBULATORY_CARE_PROVIDER_SITE_OTHER): Payer: 59 | Admitting: Physician Assistant

## 2015-03-14 ENCOUNTER — Encounter: Payer: Self-pay | Admitting: Physician Assistant

## 2015-03-14 VITALS — BP 130/84 | HR 92 | Temp 98.2°F | Resp 18 | Ht 62.0 in | Wt 228.0 lb

## 2015-03-14 DIAGNOSIS — M7989 Other specified soft tissue disorders: Secondary | ICD-10-CM

## 2015-03-14 DIAGNOSIS — Z8249 Family history of ischemic heart disease and other diseases of the circulatory system: Secondary | ICD-10-CM

## 2015-03-14 DIAGNOSIS — K219 Gastro-esophageal reflux disease without esophagitis: Secondary | ICD-10-CM | POA: Diagnosis not present

## 2015-03-14 DIAGNOSIS — Z0000 Encounter for general adult medical examination without abnormal findings: Secondary | ICD-10-CM

## 2015-03-14 NOTE — Progress Notes (Signed)
Patient ID: ROMAYNE TICAS MRN: [id number], DOB: 1971/08/07, 44 y.o. Date of Encounter: 03/14/2015,   Chief Complaint: Physical (CPE)  HPI: 44 y.o. y/o white female  here for CPE.   She had office visit with me 01/04/2014 regarding symptoms of ADD. Since then has been on medication for this. Currently is on Adderall 20 mg twice a day. She states that she takes this only on days that she is at work. Is not taking any on the weekends.  Says that on work days,  she takes one on those mornings, but often only uses this once daily dose. Only some days will she feel that she needs that second dose later in the day. She says that the medication definitely helps her focus and pay attention and complete task. Says that recently she was out of the medication for about a week. She went back on the medication she noticed a big difference. Says that without the medication she often would heart doing something else and not finish the task on hand first.  Says that she has noticed having some mild generalized headaches at times. Says that she is starting to document these to keep a log sheet/journal so that she tends track to see if there is any pattern to these. Says that she also has recently felt a few episodes of palpitations. Says that these mostly seem to happen after lunch time during the day. Also says that last night while at church she felt them. States that she really has no caffeine intake. Is that she mostly drinks just water. Occasionally drinks coffee but not on a daily basis. Has not been taking any decongestants.  With exertion she feels no palpitations at that time. No chest pressure heaviness tightness or squeezing with exertion. No increased shortness of breath/dyspnea on exertion.  Says that she rarely needs to use Xanax.  Other medical providers that she sees: GI------Dr. Rhea Belton She also sees GYN-- [facility]     Review of Systems: Consitutional: No fever,  chills, fatigue, night sweats, lymphadenopathy. No significant/unexplained weight changes. Eyes: No visual changes, eye redness, or discharge. ENT/Mouth: No ear pain, sore throat, nasal drainage, or sinus pain. Cardiovascular: No chest pressure,heaviness, tightness or squeezing, even with exertion. No increased shortness of breath or dyspnea on exertion.No  edema, orthopnea, PND. Respiratory: No cough, hemoptysis, SOB, or wheezing. Gastrointestinal: No anorexia, dysphagia, reflux, pain, nausea, vomiting, hematemesis, diarrhea, constipation, BRBPR, or melena. Breast: No mass, nodules, bulging, or retraction. No skin changes or inflammation. No nipple discharge. No lymphadenopathy. Genitourinary: No dysuria, hematuria, incontinence, vaginal discharge, pruritis, burning, abnormal bleeding, or pain. Musculoskeletal: No decreased ROM, No joint pain or swelling. No significant pain in neck, back, or extremities. Skin: No rash, pruritis, or concerning lesions. Neurological: No headache, dizziness, syncope, seizures, tremors, memory loss, coordination problems, or paresthesias. Psychological: No depression, hallucinations, SI/HI. Endocrine: No polydipsia, polyphagia, polyuria, or known diabetes.No increased fatigue. No palpitations/rapid heart rate. No significant/unexplained weight change. All other systems were reviewed and are otherwise negative.  Past Medical History  Diagnosis Date  . Obesity   . GERD (gastroesophageal reflux disease)     Omeprazole as needed, none in 6 mos.  . Fracture dislocation of elbow joint 01/15/2013    right  . Sleep apnea     uses CPAP "most nights"  . Anxiety   . Asthma      Past Surgical History  Procedure Laterality Date  . Knee arthroscopy Left 1991  . Laparoscopic  assisted vaginal hysterectomy  11/02/2006  . Tubal ligation  03/06/2005  . Laparoscopic lysis of adhesions  03/06/2005  . Cesarean section  1998; 10/10/2000    x 2  . Radial head arthroplasty  Right 01/17/2013    Procedure: RIGHT RADIAL HEAD ARTHROPLASTY OPEN REDUCTION INTERNAL FIXATION CORONOID ;  Surgeon: Tennis Must, MD;  Location: [facility];  Service: Orthopedics;  Laterality: Right;    Home Meds:  Outpatient Prescriptions Prior to Visit  Medication Sig Dispense Refill  . albuterol (PROVENTIL HFA;VENTOLIN HFA) 108 (90 BASE) MCG/ACT inhaler Inhale 2 puffs into the lungs every 6 (six) hours as needed for wheezing. 1 Inhaler 11  . ALPRAZolam (XANAX) 0.25 MG tablet Take 1 tablet (0.25 mg total) by mouth daily as needed for anxiety. 30 tablet 1  . amphetamine-dextroamphetamine (ADDERALL) 20 MG tablet Take 1 tablet (20 mg total) by mouth 2 (two) times daily. 60 tablet 0  . diltiazem 2 % GEL Apply a small amount to rectum 3 times daily 30 g 0  . furosemide (LASIX) 20 MG tablet Take 1 tablet (20 mg total) by mouth daily. Does take two (40 mg) as needed 30 tablet 5  . esomeprazole (NEXIUM) 40 MG capsule Take 1 capsule (40 mg total) by mouth daily at 12 noon. 1 capsule 0  . hydrocortisone (ANUSOL-HC) 25 MG suppository Place 1 suppository (25 mg total) rectally 2 (two) times daily. For 2 weeks (Patient not taking: Reported on 03/14/2015) 12 suppository 1  . cefdinir (OMNICEF) 300 MG capsule Take 1 capsule (300 mg total) by mouth 2 (two) times daily. 20 capsule 0  . MOVIPREP 100 G SOLR Take 1 kit (200 g total) by mouth once. (Patient not taking: Reported on 10/18/2014) 1 kit 0   No facility-administered medications prior to visit.    Allergies:  Allergies  Allergen Reactions  . Adhesive [Tape] Rash  . Latex Rash    History   Social History  . Marital Status: Married    Spouse Name: N/A  . Number of Children: 2  . Years of Education: N/A   Occupational History  . [employer]   Social History Main Topics  . Smoking status: Never Smoker   . Smokeless tobacco: Never Used  . Alcohol Use: No  . Drug Use: No  . Sexual Activity: Not on file   Other Topics  Concern  . Not on file   Social History Narrative    Family History  Problem Relation Age of Onset  . Ovarian cancer Maternal Grandmother   . Diabetes Maternal Grandmother   . Diabetes Paternal Grandmother   . Diabetes Sister   . Heart disease Father 95    MI--1st Dx CAD. Died2that time  . Liver disease Paternal Aunt   . Diabetes Mother   . Hypertension Mother     Physical Exam: Blood pressure 138/88, pulse 108, temperature 98.2 F (36.8 C), temperature source Oral, resp. rate 18, height $RemoveBe'5\' 2"'sieHvtMap$  (1.575 m), weight 228 lb (103.42 kg)., Body mass index is 41.69 kg/(m^2). I rechecked blood pressure myself 2 and got 130/84. General: Well developed, well nourished, WF. Appears in no acute distress. HEENT: Normocephalic, atraumatic. Conjunctiva pink, sclera non-icteric. Pupils 2 mm constricting to 1 mm, round, regular, and equally reactive to light and accomodation. EOMI. Internal auditory canal clear. TMs with good cone of light and without pathology. Nasal mucosa pink. Nares are without discharge. No sinus tenderness. Oral mucosa pink.  Pharynx without exudate.   Neck:  Supple. Trachea midline. No thyromegaly. Full ROM. No lymphadenopathy.No Carotid Bruits. Lungs: Clear to auscultation bilaterally without wheezes, rales, or rhonchi. Breathing is of normal effort and unlabored. Cardiovascular: RRR with S1 S2. No murmurs, rubs, or gallops. Distal pulses 2+ symmetrically. No carotid or abdominal bruits. Breast: Per Gyn. Abdomen: Soft, non-tender, non-distended with normoactive bowel sounds. No hepatosplenomegaly or masses. No rebound/guarding. No CVA tenderness. No hernias.  Genitourinary: Per Gyn. Musculoskeletal: Full range of motion and 5/5 strength throughout. Without swelling, atrophy, tenderness, crepitus, or warmth.  Skin: Warm and moist without erythema, ecchymosis, wounds, or rash. Neuro: A+Ox3. CN II-XII grossly intact. Moves all extremities spontaneously. Full sensation throughout.  Normal gait. DTR 2+ throughout upper and lower extremities. Psych:  Responds to questions appropriately with a normal affect.   Assessment/Plan:  44 y.o. y/o female here for CPE  1. Visit for preventive health examination  A. Screening Labs: Patient recently had a screening labs. These were all normal/excellent. CBC normal CMP T normal TSH normal Lipid panel normal -------Triglyceride 109 ------- HDL 39 ------- LDL 110  B. Pap: Per Gyn. She states that she does see GYN routinely and has a follow-up appointment there may 10th.  C. Screening Mammogram: Patient states that she has already had one mammogram because she felt a painful lump. States that screening mammograms will be followed with GYN.  D. DEXA/BMD:  N/A at this age  E. Colorectal Cancer Screening: She states that when she was seeing GI with symptoms they had recommended a colonoscopy but she deferred. She does have routine follow-up with GI Dr.Pyrtle  F. Immunizations:  Influenza:  She did receive influenza vaccine fall 2015 for this flu season Tetanus:   This is documented and up to date. She received this 01/2010 Pneumococcal:   She has no indication to require a pneumonia vaccine until age 78 Zostavax:  No indication for this until age 66     2. Family history of premature coronary artery disease She states that her father had MI at age 37. He had no prior diagnosed CAD. He did die of that MI at age 61. Her blood pressure is controlled. Her lipids are good. She does not smoke. Encouraged cardiovascular exercise and proper diet to continue to minimize her risk for cardiovascular disease.  3. Leg swelling Controlled with Lasix. BMET normal.  4. Gastroesophageal reflux disease, esophagitis presence not specified Controlled  Routine follow-up office visit 6 months or sooner if needed.   Marin Olp [place], [place], [facility] 03/14/2015 3:48 PM

## 2015-03-22 ENCOUNTER — Ambulatory Visit (INDEPENDENT_AMBULATORY_CARE_PROVIDER_SITE_OTHER): Payer: 59 | Admitting: Family Medicine

## 2015-03-22 ENCOUNTER — Encounter: Payer: Self-pay | Admitting: Family Medicine

## 2015-03-22 VITALS — BP 128/64 | HR 76 | Temp 98.3°F | Resp 14 | Ht 62.0 in | Wt 225.0 lb

## 2015-03-22 DIAGNOSIS — J069 Acute upper respiratory infection, unspecified: Secondary | ICD-10-CM | POA: Diagnosis not present

## 2015-03-22 MED ORDER — AZITHROMYCIN 250 MG PO TABS
ORAL_TABLET | ORAL | Status: DC
Start: 2015-03-22 — End: 2015-07-25

## 2015-03-22 MED ORDER — GUAIFENESIN-CODEINE 100-10 MG/5ML PO SOLN: 5.0000 mL | Freq: Four times a day (QID) | ORAL | Status: DC | PRN

## 2015-03-22 NOTE — Progress Notes (Signed)
Patient ID: [name], female   DOB: 15-Jan-1971, 44 y.o.   MRN: [id number]   Subjective:    Patient ID: [name], female    DOB: 09/03/1971, 32 y.o.   MRN: [id number]  Patient presents for Illness  Patient here with cough with production chest congestion and nasal drainage worsening over the past week. She has not had any significant fever no known sick contacts but works as a Marine scientist in our clinic. She's taken some over-the-counter medicine with minimal improvement. She is a nonsmoker. She has albuterol at home but has not required this She was sick a few weeks ago with the same thing but it seemed to improve and now her symptoms have returned  Review Of Systems:  GEN- denies fatigue, fever, weight loss,weakness, recent illness HEENT- denies eye drainage, change in vision,+ nasal discharge, CVS- denies chest pain, palpitations RESP- denies SOB, +cough, wheeze ABD- denies N/V, change in stools, abd pain GU- denies dysuria, hematuria, dribbling, incontinence MSK- denies joint pain, muscle aches, injury Neuro- denies headache, dizziness, syncope, seizure activity       Objective:    BP 128/64 mmHg  Pulse 76  Temp(Src) 98.3 F (36.8 C) (Oral)  Resp 14  Ht 5\' 2"  (1.575 m)  Wt 225 lb (102.059 kg)  BMI 41.14 kg/m2  SpO2 98% GEN- NAD, alert and oriented x3 HEENT- PERRL, EOMI, non injected sclera, pink conjunctiva, MMM, oropharynx clear, TM clear bilat no effusion, no  maxillary sinus tenderness, ,  +Nasal drainage  Neck- Supple, no LAD CVS- RRR, no murmur RESP- upper airway congestion, clears with cough, no wheeze, normal WOB EXT- No edema Pulses- Radial 2+        Assessment & Plan:      Problem List Items Addressed This Visit    None    Visit Diagnoses    Acute URI    -  Primary    ? previous allergies, but with back to back illness, will treat with robitussin codiene, and zpak    Relevant Medications    azithromycin (ZITHROMAX) 250 MG tablet       Note: This dictation was prepared with Dragon dictation along with smaller phrase technology. Any transcriptional errors that result from this process are unintentional.

## 2015-03-22 NOTE — Patient Instructions (Signed)
Antibiotics Fluids Rest Use inhaler as needed F/U as needed

## 2015-03-26 ENCOUNTER — Other Ambulatory Visit: Payer: Self-pay | Admitting: Obstetrics and Gynecology

## 2015-03-27 LAB — CYTOLOGY - PAP

## 2015-05-21 ENCOUNTER — Other Ambulatory Visit: Payer: Self-pay | Admitting: Family Medicine

## 2015-05-21 ENCOUNTER — Encounter: Payer: Self-pay | Admitting: Physician Assistant

## 2015-05-21 DIAGNOSIS — F988 Other specified behavioral and emotional disorders with onset usually occurring in childhood and adolescence: Secondary | ICD-10-CM

## 2015-05-21 MED ORDER — ALPRAZOLAM 0.25 MG PO TABS: 0.2500 mg | ORAL_TABLET | Freq: Every day | ORAL | Status: DC | PRN

## 2015-05-21 MED ORDER — AMPHETAMINE-DEXTROAMPHETAMINE 20 MG PO TABS: 20.0000 mg | ORAL_TABLET | Freq: Two times a day (BID) | ORAL | Status: DC

## 2015-05-21 NOTE — Telephone Encounter (Signed)
LRF 03/08/15 #60  OK refill?

## 2015-05-21 NOTE — Telephone Encounter (Signed)
Medication refilled per protocol. 

## 2015-05-21 NOTE — Telephone Encounter (Signed)
ok 

## 2015-07-25 ENCOUNTER — Encounter: Payer: Self-pay | Admitting: Family Medicine

## 2015-07-25 ENCOUNTER — Ambulatory Visit (INDEPENDENT_AMBULATORY_CARE_PROVIDER_SITE_OTHER): Payer: 59 | Admitting: Family Medicine

## 2015-07-25 VITALS — BP 150/88 | HR 88 | Temp 97.9°F | Resp 16 | Ht 62.0 in | Wt 228.0 lb

## 2015-07-25 DIAGNOSIS — R109 Unspecified abdominal pain: Secondary | ICD-10-CM | POA: Diagnosis not present

## 2015-07-25 LAB — URINALYSIS, ROUTINE W REFLEX MICROSCOPIC
Bilirubin Urine: NEGATIVE
Glucose, UA: NEGATIVE
Ketones, ur: NEGATIVE
LEUKOCYTES UA: NEGATIVE
NITRITE: NEGATIVE
Protein, ur: NEGATIVE
SPECIFIC GRAVITY, URINE: 1.02 (ref 1.001–1.035)
pH: 6 (ref 5.0–8.0)

## 2015-07-25 LAB — URINALYSIS, MICROSCOPIC ONLY
CASTS: NONE SEEN [LPF]
CRYSTALS: NONE SEEN [HPF]
Yeast: NONE SEEN [HPF]

## 2015-07-25 MED ORDER — DICYCLOMINE HCL 20 MG PO TABS: 20.0000 mg | ORAL_TABLET | Freq: Four times a day (QID) | ORAL | Status: DC

## 2015-07-25 NOTE — Progress Notes (Signed)
Subjective:    Patient ID: [name], female    DOB: [birth year], 44 y.o.   MRN: [id number]  HPI  Patient states that she was in her normal state of health until last evening. She was having a bowel movement. She developed the sudden onset of left lower quadrant abdominal pain. She became clammy and sweaty. She states that she could feel a lump in her left midabdomen. That lump was not there previously. She states that it hurts to lay on her left side last night. She denies any constipation. She denies any fever. She denies any blood in her stool or dark black tarry stools. She denies any symptoms of illness. Patient has had a hysterectomy and therefore cannot be pregnant. She denies any dysuria or hematuria.  Past Medical History  Diagnosis Date  . Obesity   . GERD (gastroesophageal reflux disease)     Omeprazole as needed, none in 6 mos.  . Fracture dislocation of elbow joint 01/15/2013    right  . Sleep apnea     uses CPAP "most nights"  . Anxiety   . Asthma    Past Surgical History  Procedure Laterality Date  . Knee arthroscopy Left 1991  . Laparoscopic assisted vaginal hysterectomy  11/02/2006  . Tubal ligation  03/06/2005  . Laparoscopic lysis of adhesions  03/06/2005  . Cesarean section  1998; 10/10/2000    x 2  . Radial head arthroplasty Right 01/17/2013    Procedure: RIGHT RADIAL HEAD ARTHROPLASTY OPEN REDUCTION INTERNAL FIXATION CORONOID ;  Surgeon: Tennis Must, MD;  Location: [facility];  Service: Orthopedics;  Laterality: Right;   Current Outpatient Prescriptions on File Prior to Visit  Medication Sig Dispense Refill  . albuterol (PROVENTIL HFA;VENTOLIN HFA) 108 (90 BASE) MCG/ACT inhaler Inhale 2 puffs into the lungs every 6 (six) hours as needed for wheezing. 1 Inhaler 11  . ALPRAZolam (XANAX) 0.25 MG tablet Take 1 tablet (0.25 mg total) by mouth daily as needed for anxiety. 30 tablet 1  . amphetamine-dextroamphetamine (ADDERALL) 20 MG tablet Take 1  tablet (20 mg total) by mouth 2 (two) times daily. 60 tablet 0  . diltiazem 2 % GEL Apply a small amount to rectum 3 times daily 30 g 0  . esomeprazole (NEXIUM) 40 MG capsule Take 1 capsule (40 mg total) by mouth daily at 12 noon. 1 capsule 0  . furosemide (LASIX) 20 MG tablet Take 1 tablet (20 mg total) by mouth daily. Does take two (40 mg) as needed 30 tablet 5  . hydrocortisone (ANUSOL-HC) 25 MG suppository Place 1 suppository (25 mg total) rectally 2 (two) times daily. For 2 weeks 12 suppository 1   No current facility-administered medications on file prior to visit.   Allergies  Allergen Reactions  . Adhesive [Tape] Rash  . Latex Rash   Social History   Social History  . Marital Status: Married    Spouse Name: N/A  . Number of Children: 2  . Years of Education: N/A   Occupational History  . [employer]   Social History Main Topics  . Smoking status: Never Smoker   . Smokeless tobacco: Never Used  . Alcohol Use: No  . Drug Use: No  . Sexual Activity: Not on file   Other Topics Concern  . Not on file   Social History Narrative     Review of Systems  All other systems reviewed and are negative.      Objective:  Physical Exam  Constitutional: She appears well-developed and well-nourished.  Cardiovascular: Normal rate, regular rhythm and normal heart sounds.   Pulmonary/Chest: Effort normal and breath sounds normal. No respiratory distress. She has no wheezes. She has no rales.  Abdominal: Soft. Bowel sounds are normal. She exhibits no distension. There is no tenderness. There is no rebound and no guarding.  Vitals reviewed.         Assessment & Plan:  Left sided abdominal pain - Plan: Urinalysis, Routine w reflex microscopic (not at [facility]), dicyclomine (BENTYL) 20 MG tablet  Given the sudden onset associated with a bowel movement I believe she may be having intestinal spasms similar IBS. Colitis would be less likely. I do not appreciate a hernia today  on exam and the patient states that she was not straining to have a bowel movement. Therefore I recommended Bentyl 20 mg every 6 hours as needed for left lower quadrant abdominal pain, a stool softener to avoid straining to have a bowel movement, and ibuprofen to relax the pain in her left lower quadrant. Certainly if she develops signs of an illness, I would keep colitis on the differential.  Again I did not appreciate a hernia today, but that is something else that I would monitor for development of.

## 2015-07-30 ENCOUNTER — Other Ambulatory Visit: Payer: Self-pay | Admitting: Family Medicine

## 2015-07-30 ENCOUNTER — Ambulatory Visit (HOSPITAL_COMMUNITY)
Admission: RE | Admit: 2015-07-30 | Discharge: 2015-07-30 | Disposition: A | Discharge status: Home / Self Care | Payer: 59 | Source: Ambulatory Visit | Attending: Family Medicine | Admitting: Family Medicine

## 2015-07-30 DIAGNOSIS — R1032 Left lower quadrant pain: Secondary | ICD-10-CM | POA: Diagnosis not present

## 2015-07-30 MED ORDER — IOHEXOL 300 MG/ML  SOLN
100.0000 mL | Freq: Once | INTRAMUSCULAR | Status: AC | PRN
  Administered 2015-07-30: 100 mL via INTRAVENOUS

## 2015-08-22 ENCOUNTER — Other Ambulatory Visit: Payer: Self-pay | Admitting: Family Medicine

## 2015-08-22 DIAGNOSIS — F988 Other specified behavioral and emotional disorders with onset usually occurring in childhood and adolescence: Secondary | ICD-10-CM

## 2015-08-22 MED ORDER — AMPHETAMINE-DEXTROAMPHETAMINE 20 MG PO TABS: 20.0000 mg | ORAL_TABLET | Freq: Two times a day (BID) | ORAL | Status: DC

## 2015-08-22 MED ORDER — FUROSEMIDE 20 MG PO TABS: 20.0000 mg | ORAL_TABLET | Freq: Every day | ORAL | Status: DC

## 2015-08-22 NOTE — Telephone Encounter (Signed)
Approved. # 60 + 0. 

## 2015-08-22 NOTE — Telephone Encounter (Signed)
LRF 05/21/15 #60  OK refill?

## 2015-08-22 NOTE — Telephone Encounter (Signed)
Rx ready and pt aware 

## 2015-10-04 ENCOUNTER — Telehealth: Payer: Self-pay | Admitting: *Deleted

## 2015-10-04 NOTE — Telephone Encounter (Signed)
Call placed to [facility].   Report found from 2010.  Requested report to be faxed to office.

## 2015-10-04 NOTE — Telephone Encounter (Signed)
Patient seen in office.   Requested to have prior sleep study performed by [facility] faxed to office.   Call placed to [facility]. No answer. Will call again.

## 2015-10-09 NOTE — Telephone Encounter (Signed)
MD reviewed study from 2010 and new orders obtained for CPAP Titration and new face mask: Dx: OSA.   Order sent to [facility] per patient request.

## 2015-10-18 ENCOUNTER — Encounter: Payer: Self-pay | Admitting: Family Medicine

## 2015-10-18 ENCOUNTER — Ambulatory Visit (INDEPENDENT_AMBULATORY_CARE_PROVIDER_SITE_OTHER): Payer: 59 | Admitting: Family Medicine

## 2015-10-18 ENCOUNTER — Ambulatory Visit: Payer: Self-pay | Admitting: Family Medicine

## 2015-10-18 ENCOUNTER — Other Ambulatory Visit: Payer: Self-pay | Admitting: Family Medicine

## 2015-10-18 VITALS — BP 140/84 | HR 112 | Temp 98.2°F | Resp 18 | Ht 62.0 in | Wt 230.0 lb

## 2015-10-18 DIAGNOSIS — I10 Essential (primary) hypertension: Secondary | ICD-10-CM

## 2015-10-18 DIAGNOSIS — R002 Palpitations: Secondary | ICD-10-CM

## 2015-10-18 DIAGNOSIS — R000 Tachycardia, unspecified: Secondary | ICD-10-CM | POA: Diagnosis not present

## 2015-10-18 LAB — CBC WITH DIFFERENTIAL/PLATELET
BASOS ABS: 0 10*3/uL (ref 0.0–0.1)
Basophils Relative: 0 % (ref 0–1)
Eosinophils Absolute: 0.1 10*3/uL (ref 0.0–0.7)
Eosinophils Relative: 1 % (ref 0–5)
HCT: 44.4 % (ref 36.0–46.0)
HEMOGLOBIN: 14.6 g/dL (ref 12.0–15.0)
LYMPHS ABS: 4.8 10*3/uL — AB (ref 0.7–4.0)
LYMPHS PCT: 33 % (ref 12–46)
MCH: 28.9 pg (ref 26.0–34.0)
MCHC: 32.9 g/dL (ref 30.0–36.0)
MCV: 87.7 fL (ref 78.0–100.0)
MONOS PCT: 4 % (ref 3–12)
MPV: 10.2 fL (ref 8.6–12.4)
Monocytes Absolute: 0.6 10*3/uL (ref 0.1–1.0)
NEUTROS ABS: 9 10*3/uL — AB (ref 1.7–7.7)
NEUTROS PCT: 62 % (ref 43–77)
Platelets: 349 10*3/uL (ref 150–400)
RBC: 5.06 MIL/uL (ref 3.87–5.11)
RDW: 13.5 % (ref 11.5–15.5)
WBC: 14.5 10*3/uL — ABNORMAL HIGH (ref 4.0–10.5)

## 2015-10-18 MED ORDER — NEBIVOLOL HCL 10 MG PO TABS: 5.0000 mg | ORAL_TABLET | Freq: Every day | ORAL | Status: DC

## 2015-10-18 NOTE — Patient Instructions (Addendum)
Hold Adderall until Monday We will call with lab results 2D Echo to be done Take the bystolic 5 mg once a day  F/u pending results

## 2015-10-18 NOTE — Progress Notes (Signed)
Patient ID: [name], female   DOB: Oct 30, 1971, 44 y.o.   MRN: [id number]   Subjective:    Patient ID: [name], female    DOB: [birth year], 44 y.o.   MRN: [id number]  Patient presents for F/U Palpitations/ HA   Palpitations for the past 2 weeks, history of sinus tachycardia in the past and palpitations no specific cause. No known history of heart disease but strong family history, father fatal MI age 26. She is a Marine scientist in my office, last week advised to cut out caffiene and stop Adderall this did not help the symptoms. She has noticed that her palpitations coincide with a headache and she hears her heartbeat in her ear.  +fatigue with symptoms,no recent illness, no chest pain or SOB associated     Review Of Systems:  GEN- + fatigue,  Denies fever, weight loss,weakness, recent illness HEENT- denies eye drainage, change in vision, nasal discharge, CVS- denies chest pain, +palpitations RESP- denies SOB, cough, wheeze ABD- denies N/V, change in stools, abd pain GU- denies dysuria, hematuria, dribbling, incontinence MSK- denies joint pain, muscle aches, injury Neuro- + headache, dizziness, syncope, seizure activity       Objective:    BP 140/84 mmHg  Pulse 112  Temp(Src) 98.2 F (36.8 C) (Oral)  Resp 18  Ht 5\' 2"  (1.575 m)  Wt 230 lb (104.327 kg)  BMI 42.06 kg/m2 GEN- NAD, alert and oriented x3 HEENT- PERRL, EOMI, non injected sclera, pink conjunctiva, MMM, oropharynx clear Neck- Supple, no thyromegaly CVS- tachycardia 110 , no murmur RESP-CTAB ABD-NABS,soft,NT,ND EXT- No edema Pulses- Radial, DP- 2+   EKG- Sinus tachycardia HR 117, no ST changes, no sign of SVT     Assessment & Plan:      Problem List Items Addressed This Visit    None    Visit Diagnoses    Palpitations    -  Primary    Unknown cause, persistant while off stimulant medication, has had in the past, ? valvular problem, check 2D Echo, Holter to be placed, check labs., With elevated  BP as well given bystolic 5mg  to take daily while we pursue workup. I doubt the Adderall is causing the issue so she may restart next week.    Relevant Orders    EKG 12-Lead (Completed)    CBC with Differential/Platelet    Comprehensive metabolic panel    TSH    ECHOCARDIOGRAM COMPLETE    Sinus tachycardia (HCC)        Relevant Orders    ECHOCARDIOGRAM COMPLETE    Essential hypertension        Relevant Medications    nebivolol (BYSTOLIC) 10 MG tablet    Other Relevant Orders    ECHOCARDIOGRAM COMPLETE       Note: This dictation was prepared with Dragon dictation along with smaller phrase technology. Any transcriptional errors that result from this process are unintentional.

## 2015-10-19 LAB — COMPREHENSIVE METABOLIC PANEL
ALBUMIN: 4 g/dL (ref 3.6–5.1)
ALK PHOS: 69 U/L (ref 33–115)
ALT: 12 U/L (ref 6–29)
AST: 15 U/L (ref 10–30)
BILIRUBIN TOTAL: 0.4 mg/dL (ref 0.2–1.2)
BUN: 11 mg/dL (ref 7–25)
CALCIUM: 9.4 mg/dL (ref 8.6–10.2)
CO2: 28 mmol/L (ref 20–31)
Chloride: 98 mmol/L (ref 98–110)
Creat: 0.7 mg/dL (ref 0.50–1.10)
Glucose, Bld: 136 mg/dL — ABNORMAL HIGH (ref 70–99)
Potassium: 3.6 mmol/L (ref 3.5–5.3)
Sodium: 137 mmol/L (ref 135–146)
Total Protein: 7.4 g/dL (ref 6.1–8.1)

## 2015-10-19 LAB — TSH: TSH: 4.51 u[IU]/mL — ABNORMAL HIGH (ref 0.350–4.500)

## 2015-10-21 LAB — T3, FREE: T3 FREE: 2.8 pg/mL (ref 2.3–4.2)

## 2015-10-21 LAB — T4, FREE: FREE T4: 0.97 ng/dL (ref 0.80–1.80)

## 2015-10-25 ENCOUNTER — Ambulatory Visit (HOSPITAL_COMMUNITY)
Admission: RE | Admit: 2015-10-25 | Discharge: 2015-10-25 | Disposition: A | Discharge status: Home / Self Care | Payer: 59 | Source: Ambulatory Visit | Attending: Family Medicine | Admitting: Family Medicine

## 2015-10-25 DIAGNOSIS — R002 Palpitations: Secondary | ICD-10-CM | POA: Diagnosis not present

## 2015-10-25 DIAGNOSIS — R000 Tachycardia, unspecified: Secondary | ICD-10-CM | POA: Diagnosis not present

## 2015-10-25 DIAGNOSIS — Z9289 Personal history of other medical treatment: Secondary | ICD-10-CM

## 2015-10-25 DIAGNOSIS — I10 Essential (primary) hypertension: Secondary | ICD-10-CM | POA: Insufficient documentation

## 2015-10-25 HISTORY — DX: Personal history of other medical treatment: Z92.89

## 2015-10-25 NOTE — Progress Notes (Signed)
  Echocardiogram 2D Echocardiogram has been performed.  [name] 10/25/2015, 12:17 PM

## 2015-11-05 ENCOUNTER — Other Ambulatory Visit: Payer: Self-pay | Admitting: Family Medicine

## 2015-11-05 DIAGNOSIS — R002 Palpitations: Secondary | ICD-10-CM

## 2015-11-05 DIAGNOSIS — I5189 Other ill-defined heart diseases: Secondary | ICD-10-CM

## 2015-11-05 NOTE — Progress Notes (Signed)
Persistant palpitations Holter monitor pending TSH minimally abnormal with normal T3, T4 Echo shows mild diastolic dysfunction  No change off Adderall Currently on bystolic and lasix Refer to cardiology for work up

## 2015-11-07 ENCOUNTER — Telehealth: Payer: Self-pay | Admitting: *Deleted

## 2015-11-07 NOTE — Telephone Encounter (Signed)
Received return call from [facility].   States that there was discrepancy in the way the order was read. Requested new order to read CPAP Titration, set 4-10 for Dx: OSA.   Prescription printed and placed on MD desk for signature.

## 2015-11-07 NOTE — Telephone Encounter (Signed)
Patient in office. Reports that she has not heard from [facility] in regards to CPAP titration.   Call placed to [facility].   Was advised that machine in currently under warranty and repairs would need to go through manufacturer.   Inquired as to why machine would need to be repaired for CPAP titration study. Was advised that order was entered in their system incorrectly as repair order.   Advised that patient required CPAP titration and CPAP mask and supplies. Message left for Dimas Chyle to return call.

## 2015-11-08 NOTE — Telephone Encounter (Signed)
Noted,agree with above 

## 2015-11-22 ENCOUNTER — Encounter: Payer: Self-pay | Admitting: Family Medicine

## 2015-12-01 DIAGNOSIS — F419 Anxiety disorder, unspecified: Secondary | ICD-10-CM | POA: Diagnosis not present

## 2015-12-01 DIAGNOSIS — J45909 Unspecified asthma, uncomplicated: Secondary | ICD-10-CM | POA: Diagnosis not present

## 2015-12-02 ENCOUNTER — Other Ambulatory Visit: Payer: Self-pay | Admitting: *Deleted

## 2015-12-02 DIAGNOSIS — J45901 Unspecified asthma with (acute) exacerbation: Secondary | ICD-10-CM

## 2015-12-02 MED ORDER — ALBUTEROL SULFATE HFA 108 (90 BASE) MCG/ACT IN AERS: 2.0000 | INHALATION_SPRAY | Freq: Four times a day (QID) | RESPIRATORY_TRACT | Status: DC | PRN

## 2015-12-02 NOTE — Telephone Encounter (Signed)
Received fax requesting refill on Albuterol.   Refill appropriate and filled per protocol.

## 2015-12-16 ENCOUNTER — Other Ambulatory Visit: Payer: Self-pay | Admitting: Physician Assistant

## 2015-12-16 DIAGNOSIS — F988 Other specified behavioral and emotional disorders with onset usually occurring in childhood and adolescence: Secondary | ICD-10-CM

## 2015-12-16 MED ORDER — AMPHETAMINE-DEXTROAMPHETAMINE 20 MG PO TABS: 20.0000 mg | ORAL_TABLET | Freq: Two times a day (BID) | ORAL | Status: DC

## 2015-12-16 NOTE — Telephone Encounter (Signed)
rx ready, pt made aware

## 2015-12-17 ENCOUNTER — Encounter: Payer: Self-pay | Admitting: Cardiovascular Disease

## 2015-12-17 ENCOUNTER — Ambulatory Visit (INDEPENDENT_AMBULATORY_CARE_PROVIDER_SITE_OTHER): Payer: 59 | Admitting: Cardiovascular Disease

## 2015-12-17 VITALS — BP 128/90 | HR 96 | Ht 62.0 in | Wt 229.8 lb

## 2015-12-17 DIAGNOSIS — I5189 Other ill-defined heart diseases: Secondary | ICD-10-CM | POA: Insufficient documentation

## 2015-12-17 DIAGNOSIS — I519 Heart disease, unspecified: Secondary | ICD-10-CM | POA: Diagnosis not present

## 2015-12-17 DIAGNOSIS — I493 Ventricular premature depolarization: Secondary | ICD-10-CM

## 2015-12-17 NOTE — Progress Notes (Signed)
Cardiology Office Note   Date:  12/17/2015   ID:  [name], DOB July 10, 1971, MRN [id number]  PCP:  Karis Juba, PA-C  Cardiologist:   Thayer Headings, MD   Chief Complaint  Patient presents with  . Shortness of Breath   Problem List 1.  Diastolic dysfunction 2. Hypertension 3. Obesity 4. Palpitations  5. Obstructive sleep apnea - has not been using her CPAP, Needs to see a pulmonologist to get another mask.    History of Present Illness: [name] is a 45 y.o. female who presents for  Further evaluation of her shortness breath and palpitations. Has had palpitations for several months.   Seem to be worse at night , while watching TV.  Also has dyspnea - primarily at rest.   Not related to lying down. Has DOE Works out at [employer] , has been going for the past month. Has had shortness of breath since getting back to the gym.    Was given lasix 20 as needed - takes it once every 1-2 months.   Seems to need to take the lasix after she has eaten more salty foods than she should .    She knows that she should limit salt intake but has not made any real effort.   Has lost 4 lbs since Jan. 1.  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   2 boys at home,  Age 7 and 75.     Past Medical History  Diagnosis Date  . Obesity   . GERD (gastroesophageal reflux disease)     Omeprazole as needed, none in 6 mos.  . Fracture dislocation of elbow joint 01/15/2013    right  . Sleep apnea     uses CPAP "most nights"  . Anxiety   . Asthma     Past Surgical History  Procedure Laterality Date  . Knee arthroscopy Left 1991  . Laparoscopic assisted vaginal hysterectomy  11/02/2006  . Tubal ligation  03/06/2005  . Laparoscopic lysis of adhesions  03/06/2005  . Cesarean section  1998; 10/10/2000    x 2  . Radial head arthroplasty Right 01/17/2013    Procedure: RIGHT RADIAL HEAD ARTHROPLASTY OPEN REDUCTION INTERNAL  FIXATION CORONOID ;  Surgeon: Tennis Must, MD;  Location: [facility];  Service: Orthopedics;  Laterality: Right;     Current Outpatient Prescriptions  Medication Sig Dispense Refill  . albuterol (PROVENTIL HFA;VENTOLIN HFA) 108 (90 Base) MCG/ACT inhaler Inhale 2 puffs into the lungs every 6 (six) hours as needed for wheezing. 1 Inhaler 11  . ALPRAZolam (XANAX) 0.25 MG tablet Take 1 tablet (0.25 mg total) by mouth daily as needed for anxiety. 30 tablet 1  . amphetamine-dextroamphetamine (ADDERALL) 20 MG tablet Take 1 tablet (20 mg total) by mouth 2 (two) times daily. 60 tablet 0  . diltiazem 2 % GEL Apply a small amount to rectum 3 times daily 30 g 0  . furosemide (LASIX) 20 MG tablet Take 1 tablet (20 mg total) by mouth daily. Does take two (40 mg) as needed 90 tablet 1  . nebivolol (BYSTOLIC) 10 MG tablet Take 0.5 tablets (5 mg total) by mouth daily. 7 tablet 0   No current facility-administered medications for this visit.    Allergies:   Adhesive and Latex    Social History:  The patient  reports that she has never smoked. She has  never used smokeless tobacco. She reports that she does not drink alcohol or use illicit drugs.   Family History:  The patient's family history includes Diabetes in her maternal grandmother, mother, paternal grandmother, and sister; Heart disease in her maternal grandfather, maternal grandmother, and maternal uncle; Heart disease (age of onset: 31) in her father; Hypertension in her mother; Liver disease in her paternal aunt; Ovarian cancer in her maternal grandmother.    ROS:  Please see the history of present illness.    Review of Systems: Constitutional:  denies fever, chills, diaphoresis, appetite change and fatigue.  HEENT: denies photophobia, eye pain, redness, hearing loss, ear pain, congestion, sore throat, rhinorrhea, sneezing, neck pain, neck stiffness and tinnitus.  Respiratory: denies SOB, DOE, cough, chest tightness, and  wheezing.  Cardiovascular: denies chest pain, palpitations and leg swelling.  Gastrointestinal: denies nausea, vomiting, abdominal pain, diarrhea, constipation, blood in stool.  Genitourinary: denies dysuria, urgency, frequency, hematuria, flank pain and difficulty urinating.  Musculoskeletal: denies  myalgias, back pain, joint swelling, arthralgias and gait problem.   Skin: denies pallor, rash and wound.  Neurological: denies dizziness, seizures, syncope, weakness, light-headedness, numbness and headaches.   Hematological: denies adenopathy, easy bruising, personal or family bleeding history.  Psychiatric/ Behavioral: denies suicidal ideation, mood changes, confusion, nervousness, sleep disturbance and agitation.       All other systems are reviewed and negative.    PHYSICAL EXAM: VS:  BP 128/90 mmHg  Pulse 96  Ht 5\' 2"  (1.575 m)  Wt 229 lb 12.8 oz (104.237 kg)  BMI 42.02 kg/m2 , BMI Body mass index is 42.02 kg/(m^2). GEN: Well nourished, well developed, in no acute distress HEENT: normal Neck: no JVD, carotid bruits, or masses Cardiac: RRR; no murmurs, rubs, or gallops,no edema  Respiratory:  clear to auscultation bilaterally, normal work of breathing GI: soft, nontender, nondistended, + BS MS: no deformity or atrophy Skin: warm and dry, no rash Neuro:  Strength and sensation are intact Psych: normal   EKG:  EKG is not ordered today. ECG 10/18/15 - sinus tach with no ST or T wave changes.    Recent Labs: 10/18/2015: ALT 12; BUN 11; Creat 0.70; Hemoglobin 14.6; Platelets 349; Potassium 3.6; Sodium 137; TSH 4.510*    Lipid Panel    Component Value Date/Time   CHOL 171 03/11/2015 0847   TRIG 109 03/11/2015 0847   HDL 39* 03/11/2015 0847   CHOLHDL 4.4 03/11/2015 0847   VLDL 22 03/11/2015 0847   LDLCALC 110* 03/11/2015 0847      Wt Readings from Last 3 Encounters:  12/17/15 229 lb 12.8 oz (104.237 kg)  10/18/15 230 lb (104.327 kg)  07/25/15 228 lb (103.42 kg)       Other studies Reviewed: Additional studies/ records that were reviewed today include: . Review of the above records demonstrates:    ASSESSMENT AND PLAN:  1.   Palpitations-  These are likely PVCs. She's been found to have PVCs on a 24-hour Holter monitor.   2. Shortness breath with exertion: she has several reasons to be short of breath. She's caring a bit of extra weight. She does have some diastolic dysfunction. She also was not wearing her CPAP mask at night.   He had a long discussion about regular exercise and weight loss. With a long discussion about correcting her diet. I strongly suggested that she get her CPAP mask replaced.  I reviewed her echocardiogram and her heart is structurally normal. She does have some diastolic dysfunction but this  should improve with weight loss and regular exercise and control of her blood pressure.   I'll see her on an as-needed basis.   Current medicines are reviewed at length with the patient today.  The patient does not have concerns regarding medicines.  The following changes have been made:  no change  Labs/ tests ordered today include:  No orders of the defined types were placed in this encounter.     Disposition:   FU with me as needed.       Nahser, Wonda Cheng, MD  12/17/2015 3:49 PM    [address] [place] [address], [place], [place]  [address] Phone: [phone]; Fax: [phone]   [facility]  [address] [place],   [address] [phone]   Fax [phone]

## 2015-12-17 NOTE — Patient Instructions (Signed)

## 2015-12-27 ENCOUNTER — Other Ambulatory Visit: Payer: Self-pay | Admitting: Family Medicine

## 2015-12-27 MED ORDER — FLUCONAZOLE 150 MG PO TABS: 150.0000 mg | ORAL_TABLET | Freq: Once | ORAL | Status: DC

## 2015-12-27 MED FILL — FLUCONAZOLE 150 MG TABLET: 150 | 1 days supply | Qty: 1 | Fill #0

## 2015-12-27 NOTE — Telephone Encounter (Signed)
Asking for Diflucan for yeast infection.  OK to send per provider.  Pt made aware.

## 2016-01-03 MED FILL — AMPHETAMINE SALTS 20 MG TAB: 20 | 30 days supply | Qty: 60 | Fill #0

## 2016-01-14 DIAGNOSIS — G4733 Obstructive sleep apnea (adult) (pediatric): Secondary | ICD-10-CM | POA: Diagnosis not present

## 2016-01-24 ENCOUNTER — Encounter: Payer: Self-pay | Admitting: Family Medicine

## 2016-01-24 ENCOUNTER — Ambulatory Visit (INDEPENDENT_AMBULATORY_CARE_PROVIDER_SITE_OTHER): Payer: 59 | Admitting: Family Medicine

## 2016-01-24 VITALS — BP 138/84 | HR 100 | Temp 98.9°F | Resp 18 | Ht 62.0 in | Wt 230.0 lb

## 2016-01-24 DIAGNOSIS — J069 Acute upper respiratory infection, unspecified: Secondary | ICD-10-CM | POA: Diagnosis not present

## 2016-01-24 DIAGNOSIS — J4521 Mild intermittent asthma with (acute) exacerbation: Secondary | ICD-10-CM | POA: Diagnosis not present

## 2016-01-24 MED ORDER — AZITHROMYCIN 250 MG PO TABS: ORAL_TABLET | ORAL | Status: DC

## 2016-01-24 MED ORDER — NEBIVOLOL HCL 5 MG PO TABS: 5.0000 mg | ORAL_TABLET | Freq: Every day | ORAL | Status: DC

## 2016-01-24 MED ORDER — PREDNISONE 20 MG PO TABS: 40.0000 mg | ORAL_TABLET | Freq: Every day | ORAL | Status: DC

## 2016-01-24 MED FILL — predniSONE 20 MG TABS: 20 | 5 days supply | Qty: 10 | Fill #0

## 2016-01-24 MED FILL — BYSTOLIC 5 MG TABLET: 5 | 30 days supply | Qty: 30 | Fill #0

## 2016-01-24 MED FILL — AZITHROMYCIN 250 MG TABLET: 250 | 5 days supply | Qty: 6 | Fill #0

## 2016-01-24 NOTE — Patient Instructions (Signed)
Take antibiotics as prescribed Start prednisone Use albuterol as prescribed F/U as needed

## 2016-01-24 NOTE — Progress Notes (Signed)
Patient ID: [name], female   DOB: 04-Feb-1971, 45 y.o.   MRN: [id number]    Subjective:    Patient ID: [name], female    DOB: 1971/10/06, 72 y.o.   MRN: [id number]  Patient presents for Asthma  Pt here with worsening cough with congestion. Cough is productive thick sputum. The past few days she's had wheezing feels like her asthma is acting up. She's been using her albuterol inhalers prescribed. She's taken some over-the-counter medications with minimal improvement. No recent fever. No body aches or flulike symptoms.    Review Of Systems:  GEN- denies fatigue, fever, weight loss,weakness, recent illness HEENT- denies eye drainage, change in vision, nasal discharge, CVS- denies chest pain, palpitations RESP- + SOB, +cough, +wheeze ABD- denies N/V, change in stools, abd pain Neuro- denies headache, dizziness, syncope, seizure activity       Objective:    BP 138/84 mmHg  Pulse 100  Resp 18  Ht 5\' 2"  (1.575 m)  Wt 230 lb (104.327 kg)  BMI 42.06 kg/m2  SpO2 93% GEN- NAD, alert and oriented x3 HEENT- PERRL, EOMI, non injected sclera, pink conjunctiva, MMM, oropharynx clear, nares clear rhinorrhea, TM clear no effusion, no maxillary sinus tenderness  Neck- Supple, shotty submandibular LAD  CVS- RRR, no murmur RESP-no wheeze, no rales, congestion bilat, normal WOB  Pulses- Radia  2+        Assessment & Plan:      Problem List Items Addressed This Visit    None    Visit Diagnoses    Asthma with acute exacerbation, mild intermittent    -  Primary    Relevant Medications    predniSONE (DELTASONE) 20 MG tablet    Acute URI        URI which has triggered asthma, worse production , wheezing. Sats good. Continue albuterol, add prednisone, zpak    Relevant Medications    azithromycin (ZITHROMAX) 250 MG tablet       Note: This dictation was prepared with Dragon dictation along with smaller phrase technology. Any transcriptional errors that result from  this process are unintentional.

## 2016-02-06 DIAGNOSIS — H52223 Regular astigmatism, bilateral: Secondary | ICD-10-CM | POA: Diagnosis not present

## 2016-02-06 DIAGNOSIS — H524 Presbyopia: Secondary | ICD-10-CM | POA: Diagnosis not present

## 2016-03-04 ENCOUNTER — Other Ambulatory Visit: Payer: Self-pay | Admitting: Family Medicine

## 2016-03-04 DIAGNOSIS — F988 Other specified behavioral and emotional disorders with onset usually occurring in childhood and adolescence: Secondary | ICD-10-CM

## 2016-03-04 MED ORDER — AMPHETAMINE-DEXTROAMPHETAMINE 20 MG PO TABS: 20.0000 mg | ORAL_TABLET | Freq: Two times a day (BID) | ORAL | Status: DC

## 2016-03-04 NOTE — Telephone Encounter (Signed)
Request 3 month supply

## 2016-03-04 NOTE — Telephone Encounter (Signed)
Rx's ready and pt aware

## 2016-03-04 NOTE — Telephone Encounter (Signed)
Approved. Can print 3 Rxes with appropriate fill dates.

## 2016-03-26 MED FILL — DEXTROAMP-AMPHETAMIN 20 MG: 20 | 30 days supply | Qty: 60 | Fill #0

## 2016-06-02 MED FILL — VENTOLIN HFA 90 MCG INHALER: 108 (90 BAS | 25 days supply | Qty: 18 | Fill #0

## 2016-06-02 MED FILL — DEXTROAMP-AMPHETAMIN 20 MG: 20 | 30 days supply | Qty: 60 | Fill #0

## 2016-06-02 MED FILL — BYSTOLIC 5 MG TABLET: 5 | 30 days supply | Qty: 30 | Fill #1

## 2016-07-21 ENCOUNTER — Encounter: Payer: Self-pay | Admitting: Cardiovascular Disease

## 2016-07-23 ENCOUNTER — Ambulatory Visit (INDEPENDENT_AMBULATORY_CARE_PROVIDER_SITE_OTHER): Payer: 59 | Admitting: Cardiovascular Disease

## 2016-07-23 ENCOUNTER — Encounter: Payer: Self-pay | Admitting: Cardiovascular Disease

## 2016-07-23 VITALS — BP 120/90 | HR 128 | Ht 62.0 in | Wt 233.6 lb

## 2016-07-23 DIAGNOSIS — R0600 Dyspnea, unspecified: Secondary | ICD-10-CM

## 2016-07-23 DIAGNOSIS — I5032 Chronic diastolic (congestive) heart failure: Secondary | ICD-10-CM

## 2016-07-23 DIAGNOSIS — R000 Tachycardia, unspecified: Secondary | ICD-10-CM | POA: Insufficient documentation

## 2016-07-23 LAB — BASIC METABOLIC PANEL
BUN: 12 mg/dL (ref 7–25)
CO2: 27 mmol/L (ref 20–31)
Calcium: 9.9 mg/dL (ref 8.6–10.2)
Chloride: 100 mmol/L (ref 98–110)
Creat: 0.65 mg/dL (ref 0.50–1.10)
GLUCOSE: 116 mg/dL — AB (ref 65–99)
POTASSIUM: 3.9 mmol/L (ref 3.5–5.3)
SODIUM: 139 mmol/L (ref 135–146)

## 2016-07-23 LAB — CBC WITH DIFFERENTIAL/PLATELET
BASOS ABS: 0 {cells}/uL (ref 0–200)
Basophils Relative: 0 %
Eosinophils Absolute: 115 cells/uL (ref 15–500)
Eosinophils Relative: 1 %
HCT: 43.3 % (ref 35.0–45.0)
HEMOGLOBIN: 14.6 g/dL (ref 11.7–15.5)
LYMPHS ABS: 4025 {cells}/uL — AB (ref 850–3900)
Lymphocytes Relative: 35 %
MCH: 29.3 pg (ref 27.0–33.0)
MCHC: 33.7 g/dL (ref 32.0–36.0)
MCV: 86.8 fL (ref 80.0–100.0)
MONO ABS: 690 {cells}/uL (ref 200–950)
MPV: 10.3 fL (ref 7.5–12.5)
Monocytes Relative: 6 %
NEUTROS ABS: 6670 {cells}/uL (ref 1500–7800)
NEUTROS PCT: 58 %
Platelets: 337 10*3/uL (ref 140–400)
RBC: 4.99 MIL/uL (ref 3.80–5.10)
RDW: 13.4 % (ref 11.0–15.0)
WBC: 11.5 10*3/uL — AB (ref 3.8–10.8)

## 2016-07-23 LAB — TSH: TSH: 6.04 m[IU]/L — AB

## 2016-07-23 LAB — BRAIN NATRIURETIC PEPTIDE: Brain Natriuretic Peptide: 5.4 pg/mL (ref <100)

## 2016-07-23 MED ORDER — METOPROLOL TARTRATE 50 MG PO TABS: 50.0000 mg | ORAL_TABLET | Freq: Two times a day (BID) | ORAL | 11 refills | Status: DC

## 2016-07-23 MED FILL — METOPROLOL TARTRATE 50 MG T: 50 | 30 days supply | Qty: 60 | Fill #0

## 2016-07-23 NOTE — Progress Notes (Signed)
Cardiology Office Note   Date:  07/23/2016   ID:  [name], DOB 01-08-1971, MRN [id number]  PCP:  Karis Juba, PA-C  Cardiologist:   Mertie Moores, MD   Chief Complaint  Patient presents with  . Follow-up    chf   Problem List 1.  Diastolic dysfunction 2. Hypertension 3. Obesity 4. Palpitations  5. Obstructive sleep apnea - has not been using her CPAP, Needs to see a pulmonologist to get another mask.    History of Present Illness: [name] is a 45 y.o. female who presents for  Further evaluation of her shortness breath and palpitations. Has had palpitations for several months.   Seem to be worse at night , while watching TV.  Also has dyspnea - primarily at rest.   Not related to lying down. Has DOE Works out at [employer] , has been going for the past month. Has had shortness of breath since getting back to the gym.    Was given lasix 20 as needed - takes it once every 1-2 months.   Seems to need to take the lasix after she has eaten more salty foods than she should .    She knows that she should limit salt intake but has not made any real effort.   Has lost 4 lbs since Jan. 1.  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   2 boys at home,  Age 2 and 77.   Sept. 7, 2017: [name] is seen for a follow up visit This past Saturday ,  Has had some hot flashes / sweats  HR has been very high for the past several weeks.  (is on adderall)  No diarrhea. No weight loss.   Has not been using her CPAP mask - ends up taking the CPAP mask off after 15 minutes.    Past Medical History:  Diagnosis Date  . Anxiety   . Asthma   . Fracture dislocation of elbow joint 01/15/2013   right  . GERD (gastroesophageal reflux disease)    Omeprazole as needed, none in 6 mos.  . Obesity   . Sleep apnea    uses CPAP "most nights"    Past Surgical History:  Procedure Laterality Date  . [place];  10/10/2000   x 2  . KNEE ARTHROSCOPY Left 1991  . LAPAROSCOPIC ASSISTED VAGINAL HYSTERECTOMY  11/02/2006  . LAPAROSCOPIC LYSIS OF ADHESIONS  03/06/2005  . RADIAL HEAD ARTHROPLASTY Right 01/17/2013   Procedure: RIGHT RADIAL HEAD ARTHROPLASTY OPEN REDUCTION INTERNAL FIXATION CORONOID ;  Surgeon: Tennis Must, MD;  Location: [facility];  Service: Orthopedics;  Laterality: Right;  . TUBAL LIGATION  03/06/2005     Current Outpatient Prescriptions  Medication Sig Dispense Refill  . albuterol (PROVENTIL HFA;VENTOLIN HFA) 108 (90 Base) MCG/ACT inhaler Inhale 2 puffs into the lungs every 6 (six) hours as needed for wheezing. 1 Inhaler 11  . ALPRAZolam (XANAX) 0.25 MG tablet Take 1 tablet (0.25 mg total) by mouth daily as needed for anxiety. 30 tablet 1  . amphetamine-dextroamphetamine (ADDERALL) 20 MG tablet Take 1 tablet (20 mg total) by mouth 2 (two) times daily. 60 tablet 0  . furosemide (LASIX) 20 MG tablet Take 1 tablet (20 mg total) by mouth daily. Does take two (40 mg) as needed 90 tablet 1  . nebivolol (BYSTOLIC) 5 MG tablet Take 1 tablet (5  mg total) by mouth daily. 30 tablet 11   No current facility-administered medications for this visit.     Allergies:   Adhesive [tape] and Latex    Social History:  The patient  reports that she has never smoked. She has never used smokeless tobacco. She reports that she does not drink alcohol or use drugs.   Family History:  The patient's family history includes Diabetes in her maternal grandmother, mother, paternal grandmother, and sister; Heart disease in her maternal grandfather, maternal grandmother, and maternal uncle; Heart disease (age of onset: 9) in her father; Hypertension in her mother; Liver disease in her paternal aunt; Ovarian cancer in her maternal grandmother.    ROS:  Please see the history of present illness.    Review of Systems: Constitutional:  denies fever, chills, diaphoresis, appetite change and fatigue.    HEENT: denies photophobia, eye pain, redness, hearing loss, ear pain, congestion, sore throat, rhinorrhea, sneezing, neck pain, neck stiffness and tinnitus.  Respiratory: denies SOB, DOE, cough, chest tightness, and wheezing.  Cardiovascular: denies chest pain, palpitations and leg swelling.  Gastrointestinal: denies nausea, vomiting, abdominal pain, diarrhea, constipation, blood in stool.  Genitourinary: denies dysuria, urgency, frequency, hematuria, flank pain and difficulty urinating.  Musculoskeletal: denies  myalgias, back pain, joint swelling, arthralgias and gait problem.   Skin: denies pallor, rash and wound.  Neurological: denies dizziness, seizures, syncope, weakness, light-headedness, numbness and headaches.   Hematological: denies adenopathy, easy bruising, personal or family bleeding history.  Psychiatric/ Behavioral: denies suicidal ideation, mood changes, confusion, nervousness, sleep disturbance and agitation.       All other systems are reviewed and negative.    PHYSICAL EXAM: VS:  BP 120/90 (BP Location: Left Arm, Patient Position: Sitting, Cuff Size: Large)   Pulse (!) 128   Ht 5\' 2"  (1.575 m)   Wt 233 lb 9.6 oz (106 kg)   BMI 42.73 kg/m  , BMI Body mass index is 42.73 kg/m. GEN: Well nourished, well developed, in no acute distress  HEENT: normal  Neck: no JVD, carotid bruits, or masses Cardiac: RRR; no murmurs, rubs, or gallops,no edema  Respiratory:  clear to auscultation bilaterally, normal work of breathing GI: soft, nontender, nondistended, + BS MS: no deformity or atrophy  Skin: warm and dry, no rash Neuro:  Strength and sensation are intact Psych: normal   EKG:  EKG is ordered today. Sept. 7, 2017:   Sinus tach at 128,   Otherwise normal ECG  Recent Labs: 10/18/2015: ALT 12; BUN 11; Creat 0.70; Hemoglobin 14.6; Platelets 349; Potassium 3.6; Sodium 137; TSH 4.510    Lipid Panel    Component Value Date/Time   CHOL 171 03/11/2015 0847   TRIG 109  03/11/2015 0847   HDL 39 (L) 03/11/2015 0847   CHOLHDL 4.4 03/11/2015 0847   VLDL 22 03/11/2015 0847   LDLCALC 110 (H) 03/11/2015 0847      Wt Readings from Last 3 Encounters:  07/23/16 233 lb 9.6 oz (106 kg)  01/24/16 230 lb (104.3 kg)  12/17/15 229 lb 12.8 oz (104.2 kg)      Other studies Reviewed: Additional studies/ records that were reviewed today include: . Review of the above records demonstrates:    ASSESSMENT AND PLAN:  1.   Palpitations-   Has sinus tachycardia at baseline.  Likely due to the adderal. She needs the adderal to function. At this point we will discontinue the Bystolic and start her on metoprolol 50 mg twice a day. We will  check labs today including CBC, basic medical profile TSH, and B natruretic peptide. I will see her again in 4-6 weeks.   2. Shortness breath with exertion: she has several reasons to be short of breath. She's caring a bit of extra weight. She does have some diastolic dysfunction. She also was not wearing her CPAP mask at night.   I'll see her on an as-needed basis.   Current medicines are reviewed at length with the patient today.  The patient does not have concerns regarding medicines.  The following changes have been made:  no change  Labs/ tests ordered today include:  No orders of the defined types were placed in this encounter.    Disposition:   FU with me as needed.       Mertie Moores, MD  07/23/2016 10:43 AM    [facility] [place], [place], [place]  [address] Phone: [phone]; Fax: [phone]   [facility]  [address] [place], [place]  [address] [phone]   Fax [phone]

## 2016-07-23 NOTE — Patient Instructions (Addendum)
Medication Instructions:  STOP Bystolic START Metoprolol (Lopressor) 50 mg twice daily   Labwork: TODAY - CBC, TSH, basic metabolic panel, BNP   Testing/Procedures: None Ordered   Follow-Up: Your physician recommends that you return for a follow-up appointment in: 4 weeks on Monday Oct. 9 at 10:00 with Dr. Acie Fredrickson   If you need a refill on your cardiac medications before your next appointment, please call your pharmacy.   Thank you for choosing [facility]! Christen Bame, RN [phone]

## 2016-07-27 ENCOUNTER — Telehealth: Payer: Self-pay | Admitting: Cardiovascular Disease

## 2016-07-27 NOTE — Telephone Encounter (Signed)
Follow Up:; ° ° °Returning your call. °

## 2016-07-27 NOTE — Telephone Encounter (Signed)
Reviewed results and plan of care with patient who verbalized understanding   Notes Recorded by Thayer Headings, MD on 07/24/2016 at 12:49 PM EDT Her TSH is actually a little elevate.  This would not be causing her to have the tachycardia ( would probably more likely cause a slow HR) I suspect the tachycardia is due to the adderall. Please forward this to her primary MD. Have her send some VS next week via the MyChart Portal.   She requests results be forwarded to [name], PA and states she will send vital signs in approximately 1-2 weeks.  She thanked me for the call.

## 2016-08-06 ENCOUNTER — Encounter: Payer: Self-pay | Admitting: Cardiovascular Disease

## 2016-08-11 ENCOUNTER — Encounter: Payer: Self-pay | Admitting: Cardiovascular Disease

## 2016-08-24 ENCOUNTER — Ambulatory Visit (INDEPENDENT_AMBULATORY_CARE_PROVIDER_SITE_OTHER): Payer: 59 | Admitting: Cardiovascular Disease

## 2016-08-24 ENCOUNTER — Encounter: Payer: Self-pay | Admitting: Cardiovascular Disease

## 2016-08-24 VITALS — BP 134/90 | HR 77 | Ht 62.0 in | Wt 236.1 lb

## 2016-08-24 DIAGNOSIS — I5030 Unspecified diastolic (congestive) heart failure: Secondary | ICD-10-CM | POA: Insufficient documentation

## 2016-08-24 DIAGNOSIS — R000 Tachycardia, unspecified: Secondary | ICD-10-CM

## 2016-08-24 DIAGNOSIS — I5032 Chronic diastolic (congestive) heart failure: Secondary | ICD-10-CM | POA: Diagnosis not present

## 2016-08-24 HISTORY — DX: Unspecified diastolic (congestive) heart failure: I50.30

## 2016-08-24 NOTE — Progress Notes (Signed)
Cardiology Office Note   Date:  08/24/2016   ID:  [name], DOB 06-11-1971, MRN [id number]  PCP:  Karis Juba, PA-C  Cardiologist:   Mertie Moores, MD   Chief Complaint  Patient presents with  . Follow-up    chronic disatolic CHF   Problem List 1.  Diastolic dysfunction 2. Hypertension 3. Obesity 4. Palpitations  5. Obstructive sleep apnea - has not been using her CPAP, Needs to see a pulmonologist to get another mask.    [name] is a 45 y.o. female who presents for  Further evaluation of her shortness breath and palpitations. Has had palpitations for several months.   Seem to be worse at night , while watching TV.  Also has dyspnea - primarily at rest.   Not related to lying down. Has DOE Works out at [employer] , has been going for the past month. Has had shortness of breath since getting back to the gym.    Was given lasix 20 as needed - takes it once every 1-2 months.   Seems to need to take the lasix after she has eaten more salty foods than she should .    She knows that she should limit salt intake but has not made any real effort.   Has lost 4 lbs since Jan. 1.  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    Husband is a Hotel manager.   2 boys at home,  Age 27 and 72.   Sept. 7, 2017: [name] is seen for a follow up visit This past Saturday ,  Has had some hot flashes / sweats  HR has been very high for the past several weeks.  (is on adderall)  No diarrhea. No weight loss.   Has not been using her CPAP mask - ends up taking the CPAP mask off after 15 minutes.   Oct. 9, 2017:  [name] is seen back for follow up of Her chronic diastolic congestive heart failure. She was seen in Sept.  We stopped the Bystolic and started Metoprolol 50 BID  Has been checking BP , Now works at [employer].      Past Medical History:  Diagnosis Date  . Anxiety   . Asthma   . Fracture dislocation of elbow  joint 01/15/2013   right  . GERD (gastroesophageal reflux disease)    Omeprazole as needed, none in 6 mos.  . Obesity   . Sleep apnea    uses CPAP "most nights"    Past Surgical History:  Procedure Laterality Date  . [place]; 10/10/2000   x 2  . KNEE ARTHROSCOPY Left 1991  . LAPAROSCOPIC ASSISTED VAGINAL HYSTERECTOMY  11/02/2006  . LAPAROSCOPIC LYSIS OF ADHESIONS  03/06/2005  . RADIAL HEAD ARTHROPLASTY Right 01/17/2013   Procedure: RIGHT RADIAL HEAD ARTHROPLASTY OPEN REDUCTION INTERNAL FIXATION CORONOID ;  Surgeon: Tennis Must, MD;  Location: [facility];  Service: Orthopedics;  Laterality: Right;  . TUBAL LIGATION  03/06/2005     Current Outpatient Prescriptions  Medication Sig Dispense Refill  . albuterol (PROVENTIL HFA;VENTOLIN HFA) 108 (90 Base) MCG/ACT inhaler Inhale 2 puffs into the lungs every 6 (six) hours as needed for wheezing. 1 Inhaler 11  . ALPRAZolam (XANAX) 0.25 MG tablet Take 1 tablet (0.25 mg total) by mouth daily as needed for anxiety. 30 tablet 1  . amphetamine-dextroamphetamine (ADDERALL) 20 MG tablet Take 1 tablet (20 mg  total) by mouth 2 (two) times daily. 60 tablet 0  . furosemide (LASIX) 20 MG tablet Take 1 tablet (20 mg total) by mouth daily. Does take two (40 mg) as needed 90 tablet 1  . metoprolol tartrate (LOPRESSOR) 50 MG tablet Take 1 tablet (50 mg total) by mouth 2 (two) times daily. 60 tablet 11   No current facility-administered medications for this visit.     Allergies:   Adhesive [tape] and Latex    Social History:  The patient  reports that she has never smoked. She has never used smokeless tobacco. She reports that she does not drink alcohol or use drugs.   Family History:  The patient's family history includes Diabetes in her maternal grandmother, mother, paternal grandmother, and sister; Heart disease in her maternal grandfather, maternal grandmother, and maternal uncle; Heart disease (age of onset: 59) in her  father; Hypertension in her mother; Liver disease in her paternal aunt; Ovarian cancer in her maternal grandmother.    ROS:  Please see the history of present illness.    Review of Systems: Constitutional:  denies fever, chills, diaphoresis, appetite change and fatigue.  HEENT: denies photophobia, eye pain, redness, hearing loss, ear pain, congestion, sore throat, rhinorrhea, sneezing, neck pain, neck stiffness and tinnitus.  Respiratory: denies SOB, DOE, cough, chest tightness, and wheezing.  Cardiovascular: denies chest pain, palpitations and leg swelling.  Gastrointestinal: denies nausea, vomiting, abdominal pain, diarrhea, constipation, blood in stool.  Genitourinary: denies dysuria, urgency, frequency, hematuria, flank pain and difficulty urinating.  Musculoskeletal: denies  myalgias, back pain, joint swelling, arthralgias and gait problem.   Skin: denies pallor, rash and wound.  Neurological: denies dizziness, seizures, syncope, weakness, light-headedness, numbness and headaches.   Hematological: denies adenopathy, easy bruising, personal or family bleeding history.  Psychiatric/ Behavioral: denies suicidal ideation, mood changes, confusion, nervousness, sleep disturbance and agitation.       All other systems are reviewed and negative.    PHYSICAL EXAM: VS:  BP 134/90 (BP Location: Right Arm, Patient Position: Sitting, Cuff Size: Large)   Pulse 77   Ht 5\' 2"  (1.575 m)   Wt 236 lb 1.9 oz (107.1 kg)   BMI 43.19 kg/m  , BMI Body mass index is 43.19 kg/m. GEN: Well nourished, well developed, in no acute distress  HEENT: normal  Neck: no JVD, carotid bruits, or masses Cardiac: RRR; no murmurs, rubs, or gallops,no edema  Respiratory:  clear to auscultation bilaterally, normal work of breathing GI: soft, nontender, nondistended, + BS MS: no deformity or atrophy  Skin: warm and dry, no rash Neuro:  Strength and sensation are intact Psych: normal   EKG:  EKG is ordered  today. Sept. 7, 2017:   Sinus tach at 128,   Otherwise normal ECG  Recent Labs: 10/18/2015: ALT 12 07/23/2016: Brain Natriuretic Peptide 5.4; BUN 12; Creat 0.65; Hemoglobin 14.6; Platelets 337; Potassium 3.9; Sodium 139; TSH 6.04    Lipid Panel    Component Value Date/Time   CHOL 171 03/11/2015 0847   TRIG 109 03/11/2015 0847   HDL 39 (L) 03/11/2015 0847   CHOLHDL 4.4 03/11/2015 0847   VLDL 22 03/11/2015 0847   LDLCALC 110 (H) 03/11/2015 0847      Wt Readings from Last 3 Encounters:  08/24/16 236 lb 1.9 oz (107.1 kg)  07/23/16 233 lb 9.6 oz (106 kg)  01/24/16 230 lb (104.3 kg)      Other studies Reviewed: Additional studies/ records that were reviewed today include: . Review of  the above records demonstrates:    ASSESSMENT AND PLAN:  1.   Palpitations-   Has sinus tachycardia at baseline.  Likely due to the adderal. She needs the adderal to function. Doing better on the Metoprolol.    2. Shortness breath with exertion: she has several reasons to be short of breath. She's caring a bit of extra weight. She does have some diastolic dysfunction. She also was not wearing her CPAP mask at night. She has gained 6 lbs over the past 7 months  Encouraged her to not eat out as much. Needs to exercise  Encouraged weight loss.  Will see her in 6 months       Current medicines are reviewed at length with the patient today.  The patient does not have concerns regarding medicines.  The following changes have been made:  no change  Labs/ tests ordered today include:  No orders of the defined types were placed in this encounter.          Mertie Moores, MD  08/24/2016 10:19 AM    [facility] [place], [place], [place]  [address] Phone: [phone]; Fax: [phone]   [facility]  [address] [place], [place]  [address] [phone]   Fax [phone]

## 2016-08-24 NOTE — Patient Instructions (Signed)
Medication Instructions:  Your physician recommends that you continue on your current medications as directed. Please refer to the Current Medication list given to you today.   Labwork: None Ordered   Testing/Procedures: None Ordered   Follow-Up: Your physician wants you to follow-up in: 6 months with Dr. Nahser.  You will receive a reminder letter in the mail two months in advance. If you don't receive a letter, please call our office to schedule the follow-up appointment.   If you need a refill on your cardiac medications before your next appointment, please call your pharmacy.   Thank you for choosing [facility]! Robie Mcniel, RN [phone]    

## 2016-10-01 ENCOUNTER — Ambulatory Visit (INDEPENDENT_AMBULATORY_CARE_PROVIDER_SITE_OTHER): Payer: 59 | Admitting: Physician Assistant

## 2016-10-01 ENCOUNTER — Encounter: Payer: Self-pay | Admitting: Physician Assistant

## 2016-10-01 VITALS — BP 132/86 | HR 110 | Temp 98.4°F | Resp 16 | Wt 236.0 lb

## 2016-10-01 DIAGNOSIS — F411 Generalized anxiety disorder: Secondary | ICD-10-CM | POA: Diagnosis not present

## 2016-10-01 DIAGNOSIS — E039 Hypothyroidism, unspecified: Secondary | ICD-10-CM | POA: Diagnosis not present

## 2016-10-01 DIAGNOSIS — F988 Other specified behavioral and emotional disorders with onset usually occurring in childhood and adolescence: Secondary | ICD-10-CM

## 2016-10-01 MED ORDER — AMPHETAMINE-DEXTROAMPHETAMINE 20 MG PO TABS: 20.0000 mg | ORAL_TABLET | Freq: Two times a day (BID) | ORAL | 0 refills | Status: DC

## 2016-10-01 MED ORDER — ALPRAZOLAM 0.25 MG PO TABS: 0.2500 mg | ORAL_TABLET | Freq: Every day | ORAL | 1 refills | Status: DC | PRN

## 2016-10-02 LAB — T4, FREE: FREE T4: 1 ng/dL (ref 0.8–1.8)

## 2016-10-02 LAB — TSH: TSH: 3.24 mIU/L

## 2016-10-02 NOTE — Progress Notes (Signed)
Patient ID: [name] MRN: [id number], DOB: 1971-11-04, 45 y.o. Date of Encounter: @DATE @  Chief Complaint:  Chief Complaint  Patient presents with  . Anxiety    HPI: 45 y.o. year old female  presents for routine f/u OV to f/u regarding Anxiety and ADD.   ~ 6 months ago she started a new job. At times, this is quite stressful. As well, at times there is conflict/stress with her and her in-laws who live nearby. Says on average she uses a Xanax about once a week---mostly either when very stressful day at work or when dealing with in-laws.   Says the Adderall definitely helps with her focus / attention at work. She can definitely tell a difference on days that she doesn't take it. Usually just takes it once a day.   She has seen Cardiology. She says that when they did lab work, "they said something about her thyroid" and needs to me f/u that as well.   No other complaints or concerns to address today.   Past Medical History:  Diagnosis Date  . Anxiety   . Asthma   . Fracture dislocation of elbow joint 01/15/2013   right  . GERD (gastroesophageal reflux disease)    Omeprazole as needed, none in 6 mos.  . Obesity   . Sleep apnea    uses CPAP "most nights"     Home Meds: Outpatient Medications Prior to Visit  Medication Sig Dispense Refill  . albuterol (PROVENTIL HFA;VENTOLIN HFA) 108 (90 Base) MCG/ACT inhaler Inhale 2 puffs into the lungs every 6 (six) hours as needed for wheezing. 1 Inhaler 11  . furosemide (LASIX) 20 MG tablet Take 1 tablet (20 mg total) by mouth daily. Does take two (40 mg) as needed 90 tablet 1  . metoprolol tartrate (LOPRESSOR) 50 MG tablet Take 1 tablet (50 mg total) by mouth 2 (two) times daily. 60 tablet 11  . ALPRAZolam (XANAX) 0.25 MG tablet Take 1 tablet (0.25 mg total) by mouth daily as needed for anxiety. 30 tablet 1  . amphetamine-dextroamphetamine (ADDERALL) 20 MG tablet Take 1 tablet (20 mg total) by mouth 2 (two) times daily. 60 tablet  0   No facility-administered medications prior to visit.     Allergies:  Allergies  Allergen Reactions  . Adhesive [Tape] Rash  . Latex Rash    Social History   Social History  . Marital status: Married    Spouse name: N/A  . Number of children: 2  . Years of education: N/A   Occupational History  . [employer]   Social History Main Topics  . Smoking status: Never Smoker  . Smokeless tobacco: Never Used  . Alcohol use No  . Drug use: No  . Sexual activity: Not on file   Other Topics Concern  . Not on file   Social History Narrative  . No narrative on file    Family History  Problem Relation Age of Onset  . Diabetes Sister   . Heart disease Father 21    MI--1st Dx CAD. Died2that time  . Diabetes Mother   . Hypertension Mother   . Heart disease Maternal Uncle   . Ovarian cancer Maternal Grandmother   . Diabetes Maternal Grandmother   . Heart disease Maternal Grandmother     Event organiser  . Diabetes Paternal Grandmother   . Liver disease Paternal Aunt   . Heart disease Maternal Grandfather      Review of Systems:  See  HPI for pertinent ROS. All other ROS negative.    Physical Exam: Blood pressure 132/86, 88-pulse , temperature 98.4 F (36.9 C), temperature source Oral, resp. rate 16, weight 236 lb (107 kg), SpO2 98 %., Body mass index is 43.16 kg/m. General: WF. Appears in no acute distress. Neck: Supple. No thyromegaly. No lymphadenopathy. Lungs: Clear bilaterally to auscultation without wheezes, rales, or rhonchi. Breathing is unlabored. Heart: RRR with S1 S2. No murmurs, rubs, or gallops. Musculoskeletal:  Strength and tone normal for age. Extremities/Skin: Warm and dry. Neuro: Alert and oriented X 3. Moves all extremities spontaneously. Gait is normal. CNII-XII grossly in tact. Psych:  Responds to questions appropriately with a normal affect.     ASSESSMENT AND PLAN:  45 y.o. year old female with  1. Attention deficit disorder (ADD)  without hyperactivity --Controlled with current medication. Printed 3 Rxes today---1 to fill now, 1 for "do not fill until 10/31/2016", 1 "do not fill until 12/01/2016" - amphetamine-dextroamphetamine (ADDERALL) 20 MG tablet; Take 1 tablet (20 mg total) by mouth 2 (two) times daily.  Dispense: 60 tablet; Refill: 0  2. Anxiety state --Controlled. She uses Xanax as sparingly as possible. ~ once a week. - ALPRAZolam (XANAX) 0.25 MG tablet; Take 1 tablet (0.25 mg total) by mouth daily as needed for anxiety.  Dispense: 30 tablet; Refill: 1  3. Hypothyroidism, unspecified type I reviewed labs checked by Cardiology. TSH was slightly high. Recheck TSH and Free T4 as well. - TSH - T4, free  Routine f/u OV 6 months, sooner if needed.  Marin Olp [place], [place], [facility] 10/02/2016 8:18 AM

## 2016-10-05 MED FILL — AMPHETAMINE SALTS 20 MG TAB: 20 | 30 days supply | Qty: 60 | Fill #0

## 2016-10-05 MED FILL — ALPRAZolam 0.25 MG TABS: 0.25 | 30 days supply | Qty: 30 | Fill #0

## 2016-11-18 MED FILL — METOPROLOL TARTRATE 50 MG T: 50 | 30 days supply | Qty: 60 | Fill #1

## 2016-11-29 ENCOUNTER — Ambulatory Visit (INDEPENDENT_AMBULATORY_CARE_PROVIDER_SITE_OTHER): Payer: Self-pay | Admitting: Family

## 2016-11-29 VITALS — BP 110/80 | HR 102 | Temp 98.8°F

## 2016-11-29 DIAGNOSIS — R059 Cough, unspecified: Secondary | ICD-10-CM

## 2016-11-29 DIAGNOSIS — R05 Cough: Secondary | ICD-10-CM

## 2016-11-29 DIAGNOSIS — J209 Acute bronchitis, unspecified: Secondary | ICD-10-CM

## 2016-11-29 MED ORDER — PREDNISONE 20 MG PO TABS: 40.0000 mg | ORAL_TABLET | Freq: Every day | ORAL | 0 refills | Status: DC

## 2016-11-29 NOTE — Progress Notes (Signed)
Subjective:     Patient ID: [name], female   DOB: 1971-02-19, 46 y.o.   MRN: [id number]  HPI 46 year old female is in today with c/o cough, chest congestion x 5 days and worsening. Has been taking Cheratussin from a previous illness. Cough productive with clear phlegm. Reports seeing a small amount of blood in her phlegm this morning. No recent travel out of the [name]   Review of Systems  Constitutional: Negative.   HENT: Positive for congestion and postnasal drip.   Respiratory: Positive for cough and chest tightness. Negative for shortness of breath and wheezing.   Cardiovascular: Negative.  Negative for chest pain.  Gastrointestinal: Negative.   Endocrine: Negative.   Musculoskeletal: Negative.   Skin: Negative.   Neurological: Negative.   Psychiatric/Behavioral: Negative.    Past Medical History:  Diagnosis Date  . Anxiety   . Asthma   . Fracture dislocation of elbow joint 01/15/2013   right  . GERD (gastroesophageal reflux disease)    Omeprazole as needed, none in 6 mos.  . Obesity   . Sleep apnea    uses CPAP "most nights"    Social History   Social History  . Marital status: Married    Spouse name: N/A  . Number of children: 2  . Years of education: N/A   Occupational History  . [employer]   Social History Main Topics  . Smoking status: Never Smoker  . Smokeless tobacco: Never Used  . Alcohol use No  . Drug use: No  . Sexual activity: Not on file   Other Topics Concern  . Not on file   Social History Narrative  . No narrative on file    Past Surgical History:  Procedure Laterality Date  . [place]; 10/10/2000   x 2  . KNEE ARTHROSCOPY Left 1991  . LAPAROSCOPIC ASSISTED VAGINAL HYSTERECTOMY  11/02/2006  . LAPAROSCOPIC LYSIS OF ADHESIONS  03/06/2005  . RADIAL HEAD ARTHROPLASTY Right 01/17/2013   Procedure: RIGHT RADIAL HEAD ARTHROPLASTY OPEN REDUCTION INTERNAL FIXATION CORONOID ;  Surgeon: Tennis Must, MD;  Location: [place] [facility];  Service: Orthopedics;  Laterality: Right;  . TUBAL LIGATION  03/06/2005    Family History  Problem Relation Age of Onset  . Diabetes Sister   . Heart disease Father 61    MI--1st Dx CAD. Died2that time  . Diabetes Mother   . Hypertension Mother   . Heart disease Maternal Uncle   . Ovarian cancer Maternal Grandmother   . Diabetes Maternal Grandmother   . Heart disease Maternal Grandmother     Event organiser  . Diabetes Paternal Grandmother   . Liver disease Paternal Aunt   . Heart disease Maternal Grandfather     Allergies  Allergen Reactions  . Adhesive [Tape] Rash  . Latex Rash    Current Outpatient Prescriptions on File Prior to Visit  Medication Sig Dispense Refill  . albuterol (PROVENTIL HFA;VENTOLIN HFA) 108 (90 Base) MCG/ACT inhaler Inhale 2 puffs into the lungs every 6 (six) hours as needed for wheezing. 1 Inhaler 11  . ALPRAZolam (XANAX) 0.25 MG tablet Take 1 tablet (0.25 mg total) by mouth daily as needed for anxiety. 30 tablet 1  . amphetamine-dextroamphetamine (ADDERALL) 20 MG tablet Take 1 tablet (20 mg total) by mouth 2 (two) times daily. 60 tablet 0  . furosemide (LASIX) 20 MG tablet Take 1 tablet (20 mg total) by mouth daily. Does take two (40 mg) as  needed 90 tablet 1  . metoprolol tartrate (LOPRESSOR) 50 MG tablet Take 1 tablet (50 mg total) by mouth 2 (two) times daily. 60 tablet 11   No current facility-administered medications on file prior to visit.     BP 110/80   Pulse (!) 102   Temp 98.8 F (37.1 C) (Oral)   SpO2 98% chart    Objective:   Physical Exam  Constitutional: She is oriented to person, place, and time. She appears well-developed.  HENT:  Right Ear: External ear normal.  Left Ear: External ear normal.  Nose: Nose normal.  Mouth/Throat: Oropharynx is clear and moist.  Neck: Normal range of motion. Neck supple.  Cardiovascular: Normal rate, regular rhythm and normal heart sounds.   Pulmonary/Chest: Effort  normal and breath sounds normal. She has no wheezes.  Diminished but clear  Abdominal: Soft. Bowel sounds are normal.  Musculoskeletal: Normal range of motion.  Neurological: She is alert and oriented to person, place, and time.  Skin: Skin is warm and dry.  Psychiatric: She has a normal mood and affect.       Assessment:     Keyonce was seen today for cough.  Diagnoses and all orders for this visit:  Acute bronchitis, unspecified organism  Cough  Other orders -     predniSONE (DELTASONE) 20 MG tablet; Take 2 tablets (40 mg total) by mouth daily with breakfast.      Plan:     Call the office with any questions or concerns. Recheck as needed or if symptoms worsen.

## 2016-11-29 NOTE — Patient Instructions (Signed)

## 2017-01-11 MED FILL — AMPHETAMINE SALTS 20 MG TAB: 20 | 30 days supply | Qty: 60 | Fill #0

## 2017-01-12 ENCOUNTER — Encounter: Payer: Self-pay | Admitting: Physician Assistant

## 2017-01-12 MED ORDER — FUROSEMIDE 20 MG PO TABS: 20.0000 mg | ORAL_TABLET | Freq: Every day | ORAL | 1 refills | Status: DC

## 2017-01-13 ENCOUNTER — Other Ambulatory Visit: Payer: Self-pay | Admitting: *Deleted

## 2017-01-13 MED ORDER — FUROSEMIDE 20 MG PO TABS: 20.0000 mg | ORAL_TABLET | Freq: Every day | ORAL | 1 refills | Status: DC

## 2017-01-13 MED FILL — FUROSEMIDE 20 MG TABLET: 20 | 45 days supply | Qty: 90 | Fill #0

## 2017-02-16 MED FILL — METOPROLOL TARTRATE 50 MG T: 50 | 30 days supply | Qty: 60 | Fill #2

## 2017-03-10 DIAGNOSIS — H1045 Other chronic allergic conjunctivitis: Secondary | ICD-10-CM | POA: Diagnosis not present

## 2017-03-10 MED FILL — PREDNISOLONE AC 1% EYE DROP: 1 | 14 days supply | Qty: 5 | Fill #0

## 2017-04-14 ENCOUNTER — Other Ambulatory Visit: Payer: Self-pay | Admitting: Physician Assistant

## 2017-04-14 DIAGNOSIS — F988 Other specified behavioral and emotional disorders with onset usually occurring in childhood and adolescence: Secondary | ICD-10-CM

## 2017-04-15 ENCOUNTER — Other Ambulatory Visit: Payer: Self-pay

## 2017-04-15 MED ORDER — AMPHETAMINE-DEXTROAMPHETAMINE 20 MG PO TABS: 20.0000 mg | ORAL_TABLET | Freq: Two times a day (BID) | ORAL | 0 refills | Status: DC

## 2017-04-15 NOTE — Telephone Encounter (Signed)
Rx will be avail for pick up after 2pm today. Pt aware via my chart message     Approved.  May print 3 prescriptions--- with future dates.        ----- Message -----  From: Sheral Flow, LPN  Sent: 07/03/5630  2:19 PM  To: Orlena Sheldon, PA-C  Subject: Melton Alar: Medication Renewal Request            Ok to refill?  ----- Message -----  From: Maureen Chatters  Sent: 04/14/2017  1:03 PM  To: [facility] Clinical Pool  Subject: Medication Renewal Request              Thomasenia T. Rumer would like a refill of the following medications:     amphetamine-dextroamphetamine (ADDERALL) 20 MG tablet Karis Juba, PA-C]    Preferred pharmacy: [facility], [place]

## 2017-04-21 ENCOUNTER — Encounter: Payer: Self-pay | Admitting: Family Medicine

## 2017-04-21 ENCOUNTER — Ambulatory Visit (INDEPENDENT_AMBULATORY_CARE_PROVIDER_SITE_OTHER): Payer: 59 | Admitting: Family Medicine

## 2017-04-21 VITALS — BP 112/76 | HR 80 | Temp 98.0°F | Resp 14 | Ht 62.0 in | Wt 221.0 lb

## 2017-04-21 DIAGNOSIS — M7989 Other specified soft tissue disorders: Secondary | ICD-10-CM

## 2017-04-21 DIAGNOSIS — I519 Heart disease, unspecified: Secondary | ICD-10-CM

## 2017-04-21 DIAGNOSIS — I5189 Other ill-defined heart diseases: Secondary | ICD-10-CM

## 2017-04-21 DIAGNOSIS — D171 Benign lipomatous neoplasm of skin and subcutaneous tissue of trunk: Secondary | ICD-10-CM

## 2017-04-21 MED ORDER — FUROSEMIDE 20 MG PO TABS: 20.0000 mg | ORAL_TABLET | Freq: Every day | ORAL | 1 refills | Status: DC

## 2017-04-21 MED FILL — FUROSEMIDE 20 MG TABLET: 20 | 45 days supply | Qty: 90 | Fill #0

## 2017-04-21 NOTE — Progress Notes (Signed)
   Subjective:    Patient ID: [name], female    DOB: [birth year], 46 y.o.   MRN: [id number]  Patient presents for Lump on Back (large area to upper back in between shoulder blades- tender to touch)  Patient here with lump to her upper back between shoulder blades. It is tender to touch. She is not sure how long it's been there. She thinks it was smaller before but has not grown. Does not get any drainage no redness. This history of diastolic dysfunction she was has a refill on her Lasix. She has to take daily. She's been trying to walk and change her diet she has lost 20 pounds since last November.   Review Of Systems:  GEN- denies fatigue, fever, weight loss,weakness, recent illness HEENT- denies eye drainage, change in vision, nasal discharge, CVS- denies chest pain, palpitations RESP- denies SOB, cough, wheeze ABD- denies N/V, change in stools, abd pain GU- denies dysuria, hematuria, dribbling, incontinence MSK- denies joint pain, muscle aches, injury Neuro- denies headache, dizziness, syncope, seizure activity       Objective:    BP 112/76   Pulse 80   Temp 98 F (36.7 C) (Oral)   Resp 14   Ht 5\' 2"  (1.575 m)   Wt 221 lb (100.2 kg)   SpO2 96%   BMI 40.42 kg/m  GEN- NAD, alert and oriented x3clear Neck- Supple, no thyromegaly CVS- RRR, no murmur RESP-CTAB Skin- between shoulder blades- mid spine, subcutaneous mass golf ball size  EXT- No edema Pulses- Radial, DP- 2+        Assessment & Plan:      Problem List Items Addressed This Visit    Leg swelling - Primary   Diastolic dysfunction    Refilled lasix, appears compensated, f/u cardiology as directed  Blood pressure controlled       Other Visit Diagnoses    Lipoma of back       Referral to general surgery for removal. Other DD sebacous cyst but doesn not have classic apperance, no core no induration or fluctuance    Relevant Orders   Ambulatory referral to General Surgery      Note: This  dictation was prepared with Dragon dictation along with smaller phrase technology. Any transcriptional errors that result from this process are unintentional.

## 2017-04-21 NOTE — Assessment & Plan Note (Signed)
Refilled lasix, appears compensated, f/u cardiology as directed  Blood pressure controlled

## 2017-04-21 NOTE — Patient Instructions (Signed)
Referral to general surgery  Continue current medications F/U as needed

## 2017-04-22 MED FILL — DEXTROAMP-AMPHETAMIN 20 MG: 20 | 30 days supply | Qty: 60 | Fill #0

## 2017-05-17 DIAGNOSIS — R222 Localized swelling, mass and lump, trunk: Secondary | ICD-10-CM | POA: Diagnosis not present

## 2017-05-21 MED FILL — METOPROLOL TARTRATE 50 MG T: 50 | 30 days supply | Qty: 60 | Fill #3

## 2017-06-23 MED FILL — DEXTROAMP-AMPHETAMIN 20 MG: 20 | 30 days supply | Qty: 60 | Fill #0

## 2017-06-24 DIAGNOSIS — Z6837 Body mass index (BMI) 37.0-37.9, adult: Secondary | ICD-10-CM | POA: Diagnosis not present

## 2017-06-24 DIAGNOSIS — Z1231 Encounter for screening mammogram for malignant neoplasm of breast: Secondary | ICD-10-CM | POA: Diagnosis not present

## 2017-06-24 DIAGNOSIS — Z01419 Encounter for gynecological examination (general) (routine) without abnormal findings: Secondary | ICD-10-CM | POA: Diagnosis not present

## 2017-07-15 MED FILL — FUROSEMIDE 20 MG TABLET: 20 | 45 days supply | Qty: 90 | Fill #1

## 2017-07-15 MED FILL — METOPROLOL TARTRATE 50 MG T: 50 | 30 days supply | Qty: 60 | Fill #4

## 2017-07-28 ENCOUNTER — Encounter: Payer: Self-pay | Admitting: Physician Assistant

## 2017-07-28 MED FILL — AMPHETAMINE SALTS 20 MG TAB: 20 | 30 days supply | Qty: 60 | Fill #0

## 2017-09-29 MED FILL — FUROSEMIDE 20 MG TABS: 20 | 45 days supply | Qty: 90 | Fill #1

## 2017-10-22 ENCOUNTER — Encounter (INDEPENDENT_AMBULATORY_CARE_PROVIDER_SITE_OTHER): Payer: Self-pay | Admitting: Family

## 2017-10-22 ENCOUNTER — Ambulatory Visit (INDEPENDENT_AMBULATORY_CARE_PROVIDER_SITE_OTHER): Payer: 59 | Admitting: Family

## 2017-10-22 ENCOUNTER — Ambulatory Visit (INDEPENDENT_AMBULATORY_CARE_PROVIDER_SITE_OTHER): Payer: 59

## 2017-10-22 DIAGNOSIS — M25562 Pain in left knee: Secondary | ICD-10-CM | POA: Diagnosis not present

## 2017-10-22 NOTE — Progress Notes (Signed)
Office Visit Note   Patient: [name]           Date of Birth: [birth year]           MRN: [id number] Visit Date: 10/22/2017              Requested by: Orlena Sheldon, PA-C [address] [place],  [id number] PCP: Orlena Sheldon, PA-C  No chief complaint on file.     HPI: Patient is a 46 year old woman who presents today complaining of a nearly 17-month history of left knee pain.  States was climbing up into a truck did have a bump to the lateral aspect of her knee experienced onset of knee pain with initial swelling.  Has had ongoing aching pain since pain when at rest of flexion.  Pain also worse in the evening.  Some pain with extension.  Has been taking ibuprofen 800 mg as well as Aleve 440 alternating with minimal relief.  No longer having any swelling.  Does have some occasional locking and giving way.  Relates did have a ligament tear in her teenage years with subsequent arthroscopy to the same knee.  Assessment & Plan: Visit Diagnoses:  1. Acute pain of left knee     Plan: Will trial DepoMedrol injection today. Follow up in office as needed.  Follow-Up Instructions: No Follow-up on file.   Left Knee Exam   Muscle Strength  The patient has normal left knee strength.  Tenderness  The patient is experiencing tenderness in the medial joint line (popliteal fossa).  Range of Motion  The patient has normal left knee ROM.  Tests  Varus: positive Valgus: negative  Other  Swelling: mild Effusion: no effusion present  Comments:  Little lateral laxity.      Patient is alert, oriented, no adenopathy, well-dressed, normal affect, normal respiratory effort.   Imaging: No results found. No images are attached to the encounter.  Labs: No results found for: HGBA1C, ESRSEDRATE, CRP, LABURIC, REPTSTATUS, GRAMSTAIN, CULT, LABORGA  @LABSALLVALUES (HGBA1)@  There is no height or weight on file to calculate BMI.  Orders:  Orders Placed This  Encounter  Procedures  . XR Knee 1-2 Views Left   No orders of the defined types were placed in this encounter.    Procedures: No procedures performed  Clinical Data: No additional findings.  ROS:  All other systems negative, except as noted in the HPI. Review of Systems  Constitutional: Negative for chills and fever.  Musculoskeletal: Positive for arthralgias and joint swelling.    Objective: Vital Signs: There were no vitals taken for this visit.  Specialty Comments:  No specialty comments available.  PMFS History: Patient Active Problem List   Diagnosis Date Noted  . Chronic diastolic CHF (congestive heart failure) ([facility]) 08/24/2016  . Sinus tachycardia ([facility]) 07/23/2016  . Diastolic dysfunction 81/11/7508  . PVC (premature ventricular contraction) 12/17/2015  . Family history of premature coronary artery disease 03/14/2015  . GERD (gastroesophageal reflux disease) 05/02/2014  . Anal fissure 05/02/2014  . Leg swelling 05/07/2013  . Obesity, unspecified 05/07/2013   Past Medical History:  Diagnosis Date  . Anxiety   . Asthma   . Fracture dislocation of elbow joint 01/15/2013   right  . GERD (gastroesophageal reflux disease)    Omeprazole as needed, none in 6 mos.  . Obesity   . Sleep apnea    uses CPAP "most nights"    Family History  Problem Relation Age of Onset  .  Diabetes Sister   . Heart disease Father 40       MI--1st Dx CAD. Died2that time  . Diabetes Mother   . Hypertension Mother   . Heart disease Maternal Uncle   . Ovarian cancer Maternal Grandmother   . Diabetes Maternal Grandmother   . Heart disease Maternal Grandmother        Event organiser  . Diabetes Paternal Grandmother   . Liver disease Paternal Aunt   . Heart disease Maternal Grandfather     Past Surgical History:  Procedure Laterality Date  . [place]; 10/10/2000   x 2  . KNEE ARTHROSCOPY Left 1991  . LAPAROSCOPIC ASSISTED VAGINAL HYSTERECTOMY  11/02/2006  .  LAPAROSCOPIC LYSIS OF ADHESIONS  03/06/2005  . RADIAL HEAD ARTHROPLASTY Right 01/17/2013   Procedure: RIGHT RADIAL HEAD ARTHROPLASTY OPEN REDUCTION INTERNAL FIXATION CORONOID ;  Surgeon: Tennis Must, MD;  Location: [facility];  Service: Orthopedics;  Laterality: Right;  . TUBAL LIGATION  03/06/2005   Social History   Occupational History  . Occupation: CMA    Employer: [employer]  Tobacco Use  . Smoking status: Never Smoker  . Smokeless tobacco: Never Used  Substance and Sexual Activity  . Alcohol use: No  . Drug use: No  . Sexual activity: Not on file

## 2017-11-30 DIAGNOSIS — N907 Vulvar cyst: Secondary | ICD-10-CM | POA: Diagnosis not present

## 2017-12-15 ENCOUNTER — Ambulatory Visit (INDEPENDENT_AMBULATORY_CARE_PROVIDER_SITE_OTHER): Payer: 59

## 2017-12-15 ENCOUNTER — Encounter (INDEPENDENT_AMBULATORY_CARE_PROVIDER_SITE_OTHER): Payer: Self-pay | Admitting: Physician Assistant

## 2017-12-15 ENCOUNTER — Ambulatory Visit (INDEPENDENT_AMBULATORY_CARE_PROVIDER_SITE_OTHER): Payer: 59 | Admitting: Physician Assistant

## 2017-12-15 DIAGNOSIS — M25562 Pain in left knee: Secondary | ICD-10-CM

## 2017-12-15 MED ORDER — LIDOCAINE HCL 1 % IJ SOLN
3.0000 mL | INTRAMUSCULAR | Status: AC | PRN
  Administered 2017-12-15: 3 mL

## 2017-12-15 MED ORDER — METHYLPREDNISOLONE ACETATE 40 MG/ML IJ SUSP
40.0000 mg | INTRAMUSCULAR | Status: AC | PRN
  Administered 2017-12-15: 40 mg via INTRA_ARTICULAR

## 2017-12-15 NOTE — Progress Notes (Signed)
Office Visit Note   Patient: [name]           Date of Birth: [birth year]           MRN: [id number] Visit Date: 12/15/2017              Requested by: Orlena Sheldon, PA-C [address] [place], [place] [address] PCP: Orlena Sheldon, PA-C   Assessment & Plan: Visit Diagnoses:  1. Acute pain of left knee     Plan: We will see how she responds to the injection.  I will actually see her at the office here tomorrow and see how she is progressing.  May at that time try reexamination of the knee if she tolerates.  She is given a hinged knee brace for support.  Ice elevation encouraged.  Questions encouraged and answered.  Follow-Up Instructions: No Follow-up on file.   Orders:  Orders Placed This Encounter  Procedures  . Large Joint Inj  . XR Knee 1-2 Views Left   No orders of the defined types were placed in this encounter.     Procedures: Large Joint Inj on 12/15/2017 9:55 AM Indications: pain Details: 22 G 1.5 in needle, anterolateral approach  Arthrogram: No  Medications: 3 mL lidocaine 1 %; 40 mg methylPREDNISolone acetate 40 MG/ML Outcome: tolerated well, no immediate complications Procedure, treatment alternatives, risks and benefits explained, specific risks discussed. Consent was given by the patient. Immediately prior to procedure a time out was called to verify the correct patient, procedure, equipment, support staff and site/side marked as required. Patient was prepped and draped in the usual sterile fashion.       Clinical Data: No additional findings.   Subjective: Chief Complaint  Patient presents with  . 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  She was coming up the steps this morning and felt a pop that was also audible in her left knee.  She had a lot of pain in the knee.  She cannot bear weight on the leg due to the pain.  She does have a history of a left knee arthroscopy in 1991  what sounds like was a meniscal tear.  Her knee has been doing well until this incident.  She had no other injury this morning. Review of Systems No fevers chills shortness of breath chest pain nausea vomiting  Objective: Vital Signs: There were no vitals taken for this visit.  Physical Exam  Constitutional: She is oriented to person, place, and time. She appears well-developed and well-nourished. No distress.  Pulmonary/Chest: Effort normal.  Neurological: She is alert and oriented to person, place, and time.  Skin: She is not diaphoretic.  Psychiatric: She has a normal mood and affect.    Ortho Exam Left knee she has tenderness along the medial lateral joint line.  Also in the popliteal region.  Nontender peripatellar region.  No instability valgus varus stressing.  Attempts of anterior drawer cause severe pain patient unable to tolerate.  McMurray is causes severe pain.  No rashes skin lesions ulcerations erythema or effusion.  She is able to do a straight leg raise and bend to 90 degrees. Specialty Comments:  No specialty comments available.  Imaging: Xr Knee 1-2 Views Left  Result Date: 12/15/2017 Left knee AP lateral views no acute fracture.  No subluxation dislocation.  These are suboptimal due to positioning.  No significant arthritic changes  noted.    PMFS History: Patient Active Problem List   Diagnosis Date Noted  . Chronic diastolic CHF (congestive heart failure) ([name]) 08/24/2016  . Sinus tachycardia ([facility]) 07/23/2016  . Diastolic dysfunction 15/17/6160  . PVC (premature ventricular contraction) 12/17/2015  . Family history of premature coronary artery disease 03/14/2015  . GERD (gastroesophageal reflux disease) 05/02/2014  . Anal fissure 05/02/2014  . Leg swelling 05/07/2013  . Obesity, unspecified 05/07/2013   Past Medical History:  Diagnosis Date  . Anxiety   . Asthma   . Fracture dislocation of elbow joint 01/15/2013   right  . GERD (gastroesophageal reflux  disease)    Omeprazole as needed, none in 6 mos.  . Obesity   . Sleep apnea    uses CPAP "most nights"    Family History  Problem Relation Age of Onset  . Diabetes Sister   . Heart disease Father 73       MI--1st Dx CAD. Died2that time  . Diabetes Mother   . Hypertension Mother   . Heart disease Maternal Uncle   . Ovarian cancer Maternal Grandmother   . Diabetes Maternal Grandmother   . Heart disease Maternal Grandmother        Event organiser  . Diabetes Paternal Grandmother   . Liver disease Paternal Aunt   . Heart disease Maternal Grandfather     Past Surgical History:  Procedure Laterality Date  . [place]; 10/10/2000   x 2  . KNEE ARTHROSCOPY Left 1991  . LAPAROSCOPIC ASSISTED VAGINAL HYSTERECTOMY  11/02/2006  . LAPAROSCOPIC LYSIS OF ADHESIONS  03/06/2005  . RADIAL HEAD ARTHROPLASTY Right 01/17/2013   Procedure: RIGHT RADIAL HEAD ARTHROPLASTY OPEN REDUCTION INTERNAL FIXATION CORONOID ;  Surgeon: Tennis Must, MD;  Location: [facility];  Service: Orthopedics;  Laterality: Right;  . TUBAL LIGATION  03/06/2005   Social History   Occupational History  . Occupation: CMA    Employer: [employer]  Tobacco Use  . Smoking status: Never Smoker  . Smokeless tobacco: Never Used  Substance and Sexual Activity  . Alcohol use: No  . Drug use: No  . Sexual activity: Not on file

## 2017-12-16 ENCOUNTER — Other Ambulatory Visit (INDEPENDENT_AMBULATORY_CARE_PROVIDER_SITE_OTHER): Payer: Self-pay

## 2017-12-16 ENCOUNTER — Ambulatory Visit (INDEPENDENT_AMBULATORY_CARE_PROVIDER_SITE_OTHER): Payer: 59 | Admitting: Physician Assistant

## 2017-12-16 ENCOUNTER — Encounter (INDEPENDENT_AMBULATORY_CARE_PROVIDER_SITE_OTHER): Payer: Self-pay | Admitting: Physician Assistant

## 2017-12-16 DIAGNOSIS — M25562 Pain in left knee: Secondary | ICD-10-CM

## 2017-12-16 NOTE — Progress Notes (Signed)
[name] returns today for follow-up of her left knee.  She continues to have pain in the knee.  She feels the knee is giving way on her.  She is wearing the knee brace.  She states if she turns her knee to the to the left in the knee and internal rotation of the knee has severe pain and feels as if this can give way.  She does feel the injection helps some.  Physical exam left knee she has full extension flexion to approximately 105 degrees forced flexion causes significant pain.  She has tenderness along medial joint line in the popliteal region.  No instability valgus varus stressing.  McMurray's positive.  No effusion abnormal warmth erythema of the knee.  Impression: Acute left knee pain with mechanical symptoms.  Plan: This point time should continue the knee brace.  Continue ice and quad strengthening.  We will obtain an MRI of her left knee to rule out a meniscal tear.  Follow-up after the MRI to go over results and discuss further treatment.

## 2017-12-17 ENCOUNTER — Other Ambulatory Visit (INDEPENDENT_AMBULATORY_CARE_PROVIDER_SITE_OTHER): Payer: Self-pay | Admitting: Physician Assistant

## 2017-12-17 DIAGNOSIS — M25562 Pain in left knee: Secondary | ICD-10-CM

## 2017-12-18 ENCOUNTER — Ambulatory Visit (HOSPITAL_BASED_OUTPATIENT_CLINIC_OR_DEPARTMENT_OTHER): Payer: 59

## 2017-12-19 ENCOUNTER — Ambulatory Visit
Admission: RE | Admit: 2017-12-19 | Discharge: 2017-12-19 | Disposition: A | Discharge status: Home / Self Care | Payer: 59 | Source: Ambulatory Visit | Attending: Physician Assistant | Admitting: Physician Assistant

## 2017-12-19 DIAGNOSIS — M23222 Derangement of posterior horn of medial meniscus due to old tear or injury, left knee: Secondary | ICD-10-CM | POA: Diagnosis not present

## 2017-12-19 DIAGNOSIS — M25562 Pain in left knee: Secondary | ICD-10-CM

## 2017-12-20 ENCOUNTER — Other Ambulatory Visit: Payer: Self-pay | Admitting: Physician Assistant

## 2017-12-20 MED FILL — FUROSEMIDE 20 MG TABS: 20 | 45 days supply | Qty: 90 | Fill #0

## 2017-12-20 NOTE — Telephone Encounter (Signed)
Refill appropriate 

## 2017-12-24 ENCOUNTER — Other Ambulatory Visit: Payer: 59

## 2017-12-24 ENCOUNTER — Other Ambulatory Visit: Payer: Self-pay

## 2017-12-24 DIAGNOSIS — Z0000 Encounter for general adult medical examination without abnormal findings: Secondary | ICD-10-CM

## 2017-12-26 LAB — LIPID PANEL
Cholesterol: 208 mg/dL — ABNORMAL HIGH (ref <200)
HDL: 50 mg/dL — ABNORMAL LOW (ref >50)
LDL Cholesterol (Calc): 134 mg/dL (calc) — ABNORMAL HIGH
Non-HDL Cholesterol (Calc): 158 mg/dL (calc) — ABNORMAL HIGH (ref <130)
Total CHOL/HDL Ratio: 4.2 (calc) (ref <5.0)
Triglycerides: 126 mg/dL (ref <150)

## 2017-12-26 LAB — CBC WITH DIFFERENTIAL/PLATELET
BASOS PCT: 0.3 %
Basophils Absolute: 29 cells/uL (ref 0–200)
EOS PCT: 1.3 %
Eosinophils Absolute: 125 cells/uL (ref 15–500)
HEMATOCRIT: 44.3 % (ref 35.0–45.0)
HEMOGLOBIN: 14.8 g/dL (ref 11.7–15.5)
LYMPHS ABS: 4445 {cells}/uL — AB (ref 850–3900)
MCH: 29.2 pg (ref 27.0–33.0)
MCHC: 33.4 g/dL (ref 32.0–36.0)
MCV: 87.5 fL (ref 80.0–100.0)
MPV: 10.3 fL (ref 7.5–12.5)
Monocytes Relative: 5.5 %
NEUTROS ABS: 4474 {cells}/uL (ref 1500–7800)
NEUTROS PCT: 46.6 %
Platelets: 388 10*3/uL (ref 140–400)
RBC: 5.06 10*6/uL (ref 3.80–5.10)
RDW: 12.6 % (ref 11.0–15.0)
Total Lymphocyte: 46.3 %
WBC mixed population: 528 cells/uL (ref 200–950)
WBC: 9.6 10*3/uL (ref 3.8–10.8)

## 2017-12-26 LAB — COMPLETE METABOLIC PANEL WITH GFR
AG Ratio: 1.4 (calc) (ref 1.0–2.5)
ALKALINE PHOSPHATASE (APISO): 75 U/L (ref 33–115)
ALT: 11 U/L (ref 6–29)
AST: 12 U/L (ref 10–30)
Albumin: 4.2 g/dL (ref 3.6–5.1)
BILIRUBIN TOTAL: 0.3 mg/dL (ref 0.2–1.2)
BUN: 15 mg/dL (ref 7–25)
CALCIUM: 9.5 mg/dL (ref 8.6–10.2)
CO2: 27 mmol/L (ref 20–32)
Chloride: 102 mmol/L (ref 98–110)
Creat: 0.63 mg/dL (ref 0.50–1.10)
GFR, EST AFRICAN AMERICAN: 126 mL/min/{1.73_m2} (ref >=60)
GFR, Est Non African American: 109 mL/min/{1.73_m2} (ref >=60)
GLOBULIN: 3 g/dL (ref 1.9–3.7)
Glucose, Bld: 97 mg/dL (ref 65–99)
POTASSIUM: 4.5 mmol/L (ref 3.5–5.3)
Sodium: 139 mmol/L (ref 135–146)
Total Protein: 7.2 g/dL (ref 6.1–8.1)

## 2017-12-26 LAB — HIV ANTIBODY (ROUTINE TESTING W REFLEX): HIV: NONREACTIVE

## 2017-12-26 LAB — TSH: TSH: 3.21 m[IU]/L

## 2017-12-29 ENCOUNTER — Ambulatory Visit (INDEPENDENT_AMBULATORY_CARE_PROVIDER_SITE_OTHER): Payer: 59 | Admitting: Physician Assistant

## 2017-12-29 ENCOUNTER — Encounter: Payer: Self-pay | Admitting: Physician Assistant

## 2017-12-29 ENCOUNTER — Other Ambulatory Visit: Payer: Self-pay

## 2017-12-29 VITALS — BP 126/84 | HR 95 | Temp 98.1°F | Resp 14 | Ht 64.0 in | Wt 228.6 lb

## 2017-12-29 DIAGNOSIS — Z8249 Family history of ischemic heart disease and other diseases of the circulatory system: Secondary | ICD-10-CM

## 2017-12-29 DIAGNOSIS — I493 Ventricular premature depolarization: Secondary | ICD-10-CM

## 2017-12-29 DIAGNOSIS — F988 Other specified behavioral and emotional disorders with onset usually occurring in childhood and adolescence: Secondary | ICD-10-CM | POA: Diagnosis not present

## 2017-12-29 DIAGNOSIS — M7989 Other specified soft tissue disorders: Secondary | ICD-10-CM

## 2017-12-29 DIAGNOSIS — K219 Gastro-esophageal reflux disease without esophagitis: Secondary | ICD-10-CM

## 2017-12-29 DIAGNOSIS — I5032 Chronic diastolic (congestive) heart failure: Secondary | ICD-10-CM

## 2017-12-29 DIAGNOSIS — Z0000 Encounter for general adult medical examination without abnormal findings: Secondary | ICD-10-CM

## 2017-12-29 MED ORDER — AMPHETAMINE-DEXTROAMPHETAMINE 20 MG PO TABS: 20.0000 mg | ORAL_TABLET | Freq: Two times a day (BID) | ORAL | 0 refills | Status: DC

## 2017-12-29 MED FILL — AMPHETAMINE SALTS 20 MG TAB: 20 | 30 days supply | Qty: 60 | Fill #0

## 2017-12-29 NOTE — Progress Notes (Signed)
Patient ID: [name] MRN: [id number], DOB: June 06, 1971, 47 y.o. Date of Encounter: 12/29/2017,   Chief Complaint: Physical (CPE)  HPI: 47 y.o. y/o white female  here for CPE.   She had office visit with me 01/04/2014 regarding symptoms of ADD. Since then has been on medication for this. Currently is on Adderall 20 mg twice a day. She states that she need to take this pretty much every day and usually is having to take this twice a day most every day. She says that the medication definitely helps her focus and pay attention and complete task.  Medication is causing no adverse effects.  Is causing no headaches, no abdominal pain,.  Says that she very rarely needs to use Xanax.  Does want to continue to have this available to use if needed.  Other medical providers that she sees: She also sees GYN-- [facility]  She has seen Dr. Hilarie Fredrickson for GI in the past but states that at this point she is to only go there if needed and has not needed to go there in a couple years.  She reports that she very rarely needs the albuterol.  Says that sometimes when there is a change in seasons and significant change in weather, then she may need to use it some but otherwise overall this is very rarely needed.  She reports that she is needing to take the Lasix every morning.  Says that she takes 2 of the 20 mg for total of 40 mg pretty much every single morning.   Lower edema is controlled with using the Lasix. Also is taking the metoprolol as directed.    States that she is going to have to have left knee surgery in March.  Will be laparoscopic.  No other medical updates.  No other concerns to address today.  Also here for CPE to update preventive care. She came for fasting for lab work recently so we will review those results today.   Review of Systems: Consitutional: No fever, chills, fatigue, night sweats, lymphadenopathy. No significant/unexplained weight changes. Eyes: No visual  changes, eye redness, or discharge. ENT/Mouth: No ear pain, sore throat, nasal drainage, or sinus pain. Cardiovascular: No chest pressure,heaviness, tightness or squeezing, even with exertion. No increased shortness of breath or dyspnea on exertion.No  edema, orthopnea, PND. Respiratory: No cough, hemoptysis, SOB. Gastrointestinal: No anorexia, dysphagia, reflux, pain, nausea, vomiting, hematemesis, diarrhea, constipation, BRBPR, or melena. Breast: No mass, nodules, bulging, or retraction. No skin changes or inflammation. No nipple discharge. No lymphadenopathy. Genitourinary: No dysuria, hematuria, incontinence, vaginal discharge, pruritis, burning, abnormal bleeding, or pain. Musculoskeletal: No decreased ROM, No joint pain or swelling. No significant pain in neck, back, or extremities. Skin: No rash, pruritis, or concerning lesions. Neurological: No headache, dizziness, syncope, seizures, tremors, memory loss, coordination problems, or paresthesias. Psychological: No depression, hallucinations, SI/HI. Endocrine: No polydipsia, polyphagia, polyuria, or known diabetes.No increased fatigue. No palpitations/rapid heart rate. No significant/unexplained weight change. All other systems were reviewed and are otherwise negative.  Past Medical History:  Diagnosis Date  . Anxiety   . Asthma   . Fracture dislocation of elbow joint 01/15/2013   right  . GERD (gastroesophageal reflux disease)    Omeprazole as needed, none in 6 mos.  . Obesity   . Sleep apnea    uses CPAP "most nights"     Past Surgical History:  Procedure Laterality Date  . [place]; 10/10/2000   x 2  .  KNEE ARTHROSCOPY Left 1991  . LAPAROSCOPIC ASSISTED VAGINAL HYSTERECTOMY  11/02/2006  . LAPAROSCOPIC LYSIS OF ADHESIONS  03/06/2005  . RADIAL HEAD ARTHROPLASTY Right 01/17/2013   Procedure: RIGHT RADIAL HEAD ARTHROPLASTY OPEN REDUCTION INTERNAL FIXATION CORONOID ;  Surgeon: Tennis Must, MD;  Location: [facility];  Service: Orthopedics;  Laterality: Right;  . TUBAL LIGATION  03/06/2005    Home Meds:  Outpatient Medications Prior to Visit  Medication Sig Dispense Refill  . albuterol (PROVENTIL HFA;VENTOLIN HFA) 108 (90 Base) MCG/ACT inhaler Inhale 2 puffs into the lungs every 6 (six) hours as needed for wheezing. 1 Inhaler 11  . ALPRAZolam (XANAX) 0.25 MG tablet Take 1 tablet (0.25 mg total) by mouth daily as needed for anxiety. 30 tablet 1  . amphetamine-dextroamphetamine (ADDERALL) 20 MG tablet Take 1 tablet (20 mg total) by mouth 2 (two) times daily. 60 tablet 0  . furosemide (LASIX) 20 MG tablet Take 1 tablet (20 mg total) by mouth daily. Does take two (40 mg) as needed 90 tablet 1  . metoprolol tartrate (LOPRESSOR) 50 MG tablet Take 1 tablet (50 mg total) by mouth 2 (two) times daily. 60 tablet 11  . furosemide (LASIX) 20 MG tablet TAKE 1 TABLET (20 MG TOTAL) BY MOUTH DAILY. DOES TAKE TWO (40 MG) AS NEEDED 90 tablet 1   No facility-administered medications prior to visit.     Allergies:  Allergies  Allergen Reactions  . Adhesive [Tape] Rash  . Latex Rash    Social History   Socioeconomic History  . Marital status: Married    Spouse name: Not on file  . Number of children: 2  . Years of education: Not on file  . Highest education level: Not on file  Social Needs  . Financial resource strain: Not on file  . Food insecurity - worry: Not on file  . Food insecurity - inability: Not on file  . Transportation needs - medical: Not on file  . Transportation needs - non-medical: Not on file  Occupational History  . Occupation: CMA    Employer: [employer]  Tobacco Use  . Smoking status: Never Smoker  . Smokeless tobacco: Never Used  Substance and Sexual Activity  . Alcohol use: No  . Drug use: No  . Sexual activity: Not on file  Other Topics Concern  . Not on file  Social History Narrative  . Not on file    Family History  Problem Relation Age of Onset  .  Diabetes Sister   . Heart disease Father 64       MI--1st Dx CAD. Died2that time  . Diabetes Mother   . Hypertension Mother   . Heart disease Maternal Uncle   . Ovarian cancer Maternal Grandmother   . Diabetes Maternal Grandmother   . Heart disease Maternal Grandmother        Event organiser  . Diabetes Paternal Grandmother   . Liver disease Paternal Aunt   . Heart disease Maternal Grandfather     Physical Exam: Blood pressure 126/84, pulse 95, temperature 98.1 F (36.7 C), temperature source Oral, resp. rate 14, height 5\' 4"  (1.626 m), weight 103.7 kg (228 lb 9.6 oz), SpO2 98 %., Body mass index is 39.24 kg/m.  General:  Obese WF. Appears in no acute distress. HEENT: Normocephalic, atraumatic. Conjunctiva pink, sclera non-icteric. Pupils 2 mm constricting to 1 mm, round, regular, and equally reactive to light and accomodation. EOMI. Internal auditory canal clear. TMs with good cone of light  and without pathology.  Oral mucosa pink.   Neck: Supple. Trachea midline. No thyromegaly. Full ROM. No lymphadenopathy.No Carotid Bruits. Lungs: Clear to auscultation bilaterally without wheezes, rales, or rhonchi. Breathing is of normal effort and unlabored. Cardiovascular: RRR with S1 S2. No murmurs, rubs, or gallops. Distal pulses 2+ symmetrically. No carotid or abdominal bruits. Breast: Per Gyn. Abdomen: Soft, non-tender, non-distended with normoactive bowel sounds. No hepatosplenomegaly or masses. No rebound/guarding. No CVA tenderness. No hernias.  Genitourinary: Per Gyn. Musculoskeletal: She is wearing brace on left knee.  Remainder of musculoskeletal exam normal. Skin: Warm and moist without erythema, ecchymosis, wounds, or rash. Neuro: A+Ox3. CN II-XII grossly intact. Moves all extremities spontaneously. Full sensation throughout. Normal gait. Psych:  Responds to questions appropriately with a normal affect.   Assessment/Plan:  48 y.o. y/o female here for CPE   Visit for preventive  health examination  A. Screening Labs: Patient recently had a screening labs. These were all normal. CBC normal CMET normal TSH normal Lipid panel normal  B. Pap: Per Gyn. She states that she does see GYN routinely.  C. Screening Mammogram: States that screening mammograms are followed by GYN.  D. DEXA/BMD:  N/A at this age  E. Colorectal Cancer Screening: She states that when she was seeing GI with symptoms they had recommended a colonoscopy but she deferred.   F. Immunizations:  Influenza:  She did receive influenza vaccine fall 2018 for this flu season Tetanus:   This is documented and up to date. She received this 01/2010 Pneumococcal:   She has no indication to require a pneumonia vaccine until age 21 (asthma is extremely mild -- can hold off on pneumonia vaccine) Shingrix:  No indication for this until age 28----Will discuss at age 57   Family history of premature coronary artery disease She states that her father had MI at age 57. He had no prior diagnosed CAD. He did die of that MI at age 12. Her blood pressure is controlled. Her lipids are good. She does not smoke. Encouraged cardiovascular exercise and proper diet to continue to minimize her risk for cardiovascular disease.  Chronic diastolic CHF (congestive heart failure) ([facility]) Leg swelling Controlled with Lasix. BMET normal.  PVC (premature ventricular contraction) Controlled with metoprolol.  Gastroesophageal reflux disease, esophagitis presence not specified Controlled  Attention deficit disorder (ADD) without hyperactivity This is stable and controlled on current dose of Adderall 20 mg twice daily. In the past we have had discussion that this is a stimulant and does have effect on her heart and cardiovascular system and she is aware of those risk versus benefit.  However she cannot get her work done and complete tasks and job duties without medication.  In the past when she has gone without medication she  has been able to see a very huge difference in her focus and attention and completing task.      Routine follow-up office visit 6 months or sooner if needed.   [address] [place], [place], [facility] 12/29/2017 4:51 PM

## 2018-01-20 ENCOUNTER — Encounter: Payer: Self-pay | Admitting: Orthopaedic Surgery

## 2018-01-20 DIAGNOSIS — S83232A Complex tear of medial meniscus, current injury, left knee, initial encounter: Secondary | ICD-10-CM | POA: Diagnosis not present

## 2018-01-20 DIAGNOSIS — G8918 Other acute postprocedural pain: Secondary | ICD-10-CM | POA: Diagnosis not present

## 2018-01-20 DIAGNOSIS — S83242A Other tear of medial meniscus, current injury, left knee, initial encounter: Secondary | ICD-10-CM | POA: Diagnosis not present

## 2018-01-20 DIAGNOSIS — M94262 Chondromalacia, left knee: Secondary | ICD-10-CM | POA: Diagnosis not present

## 2018-01-20 MED FILL — HYDROCODON-APAP 5-325: 5-325 | 4 days supply | Qty: 40 | Fill #0

## 2018-01-27 ENCOUNTER — Encounter (INDEPENDENT_AMBULATORY_CARE_PROVIDER_SITE_OTHER): Payer: Self-pay | Admitting: Physician Assistant

## 2018-01-27 ENCOUNTER — Ambulatory Visit (INDEPENDENT_AMBULATORY_CARE_PROVIDER_SITE_OTHER): Payer: 59 | Admitting: Physician Assistant

## 2018-01-27 DIAGNOSIS — Z9889 Other specified postprocedural states: Secondary | ICD-10-CM

## 2018-01-27 NOTE — Progress Notes (Signed)
HPI: [name] returns today 1 week status post left knee arthroscopy.  She states overall that she is had some soreness.  But overall trending towards improvement.  Said no calf pain shortness of breath chest pain.  Physical exam: Left knee good range of motion.  Calf slight tenderness but otherwise supple.  Port sites are healing well no signs of infection.  Impression status post left knee arthroscopy with partial medial meniscectomy and chondroplasty medial femoral condyle.  Plan reviewed the images from the arthroscopy today.  Discussed with her knee friendly exercises.  We will see her back on an as-needed basis.  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  She understands she has some grade 2 changes medial femoral condyle.

## 2018-02-23 ENCOUNTER — Other Ambulatory Visit: Payer: Self-pay | Admitting: Physician Assistant

## 2018-02-23 DIAGNOSIS — F988 Other specified behavioral and emotional disorders with onset usually occurring in childhood and adolescence: Secondary | ICD-10-CM

## 2018-02-23 MED ORDER — AMPHETAMINE-DEXTROAMPHETAMINE 20 MG PO TABS: 20.0000 mg | ORAL_TABLET | Freq: Two times a day (BID) | ORAL | 0 refills | Status: DC

## 2018-02-23 MED FILL — AMPHETAMINE SALTS 20 MG TAB: 20 | 30 days supply | Qty: 60 | Fill #0

## 2018-02-23 NOTE — Telephone Encounter (Signed)
Last OV 12/29/2017 Last refill 12/29/2017 Ok to refill?

## 2018-03-11 MED FILL — FUROSEMIDE 20 MG TABS: 20 | 45 days supply | Qty: 90 | Fill #1

## 2018-03-14 ENCOUNTER — Encounter: Payer: Self-pay | Admitting: Cardiovascular Disease

## 2018-03-17 ENCOUNTER — Encounter: Payer: Self-pay | Admitting: Cardiovascular Disease

## 2018-03-17 ENCOUNTER — Ambulatory Visit: Payer: 59 | Admitting: Cardiovascular Disease

## 2018-03-17 VITALS — BP 116/82 | HR 97 | Ht 64.0 in | Wt 228.0 lb

## 2018-03-17 DIAGNOSIS — I5032 Chronic diastolic (congestive) heart failure: Secondary | ICD-10-CM | POA: Diagnosis not present

## 2018-03-17 DIAGNOSIS — R000 Tachycardia, unspecified: Secondary | ICD-10-CM

## 2018-03-17 MED ORDER — METOPROLOL TARTRATE 50 MG PO TABS: 50.0000 mg | ORAL_TABLET | Freq: Two times a day (BID) | ORAL | 11 refills | Status: DC

## 2018-03-17 MED FILL — METOPROLOL TARTRATE 50 MG T: 50 | 30 days supply | Qty: 60 | Fill #0

## 2018-03-17 NOTE — Progress Notes (Signed)
Cardiology Office Note   Date:  03/17/2018   ID:  [name], [name] [birth year], MRN [id number]  PCP:  Orlena Sheldon, PA-C  Cardiologist:   Mertie Moores, MD   Chief Complaint  Patient presents with  . Congestive Heart Failure   Problem List 1.  Diastolic dysfunction 2. Hypertension 3. Obesity 4. Palpitations  5. Obstructive sleep apnea - has not been using her CPAP, Needs to see a pulmonologist to get another mask.    [name] is a 47 y.o. female who presents for  Further evaluation of her shortness breath and palpitations. Has had palpitations for several months.   Seem to be worse at night , while watching TV.  Also has dyspnea - primarily at rest.   Not related to lying down. Has DOE Works out at [employer] , has been going for the past month. Has had shortness of breath since getting back to the gym.    Was given lasix 20 as needed - takes it once every 1-2 months.   Seems to need to take the lasix after she has eaten more salty foods than she should .    She knows that she should limit salt intake but has not made any real effort.   Has lost 4 lbs since Jan. 1.  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    Husband is a Hotel manager.   2 boys at home,  Age 68 and 42.   Sept. 7, 2017: [name] is seen for a follow up visit This past Saturday ,  Has had some hot flashes / sweats  HR has been very high for the past several weeks.  (is on adderall)  No diarrhea. No weight loss.   Has not been using her CPAP mask - ends up taking the CPAP mask off after 15 minutes.   Oct. 9, 2017:  [name] is seen back for follow up of Her chronic diastolic congestive heart failure. She was seen in Sept.  We stopped the Bystolic and started Metoprolol 50 BID  Has been checking BP , Now works at [employer].   Mar 17, 2018: [name] is seen back today for further evaluation of her sinus tachycardia and diastolic heart  failure. Had partial left knee replacement [name] )  Occasional episodes of shortness of breath.   Is walking at lunch - gets a little short of breath  Is making progress with her walking   Wt today is 228 lbs.    Past Medical History:  Diagnosis Date  . Anxiety   . Asthma   . Fracture dislocation of elbow joint 01/15/2013   right  . GERD (gastroesophageal reflux disease)    Omeprazole as needed, none in 6 mos.  . Obesity   . Sleep apnea    uses CPAP "most nights"    Past Surgical History:  Procedure Laterality Date  . [place]; 10/10/2000   x 2  . KNEE ARTHROSCOPY Left 1991  . LAPAROSCOPIC ASSISTED VAGINAL HYSTERECTOMY  11/02/2006  . LAPAROSCOPIC LYSIS OF ADHESIONS  03/06/2005  . RADIAL HEAD ARTHROPLASTY Right 01/17/2013   Procedure: RIGHT RADIAL HEAD ARTHROPLASTY OPEN REDUCTION INTERNAL FIXATION CORONOID ;  Surgeon: Tennis Must, MD;  Location: [facility];  Service: Orthopedics;  Laterality: Right;  . TUBAL LIGATION  03/06/2005     Current Outpatient Medications  Medication Sig Dispense Refill  . albuterol (PROVENTIL  HFA;VENTOLIN HFA) 108 (90 Base) MCG/ACT inhaler Inhale 2 puffs into the lungs every 6 (six) hours as needed for wheezing. 1 Inhaler 11  . ALPRAZolam (XANAX) 0.25 MG tablet Take 1 tablet (0.25 mg total) by mouth daily as needed for anxiety. 30 tablet 1  . amphetamine-dextroamphetamine (ADDERALL) 20 MG tablet Take 1 tablet (20 mg total) by mouth 2 (two) times daily. 60 tablet 0  . furosemide (LASIX) 20 MG tablet Take 1 tablet (20 mg total) by mouth daily. Does take two (40 mg) as needed 90 tablet 1  . metoprolol tartrate (LOPRESSOR) 50 MG tablet Take 1 tablet (50 mg total) by mouth 2 (two) times daily. 60 tablet 11   No current facility-administered medications for this visit.     Allergies:   Adhesive [tape] and Latex    Social History:  The patient  reports that she has never smoked. She has never used smokeless  tobacco. She reports that she does not drink alcohol or use drugs.   Family History:  The patient's family history includes Diabetes in her maternal grandmother, mother, paternal grandmother, and sister; Heart disease in her maternal grandfather, maternal grandmother, and maternal uncle; Heart disease (age of onset: 4) in her father; Hypertension in her mother; Liver disease in her paternal aunt; Ovarian cancer in her maternal grandmother.    ROS:   Noted in current history, otherwise review of systems is negative.    Physical Exam: Blood pressure 116/82, pulse 97, height 5\' 4"  (1.626 m), weight 228 lb (103.4 kg).  GEN:  Well nourished, well developed in no acute distress HEENT: Normal NECK: No JVD; No carotid bruits LYMPHATICS: No lymphadenopathy CARDIAC: RRR  no murmurs, rubs, gallops RESPIRATORY:  Clear to auscultation without rales, wheezing or rhonchi  ABDOMEN: Soft, non-tender, non-distended MUSCULOSKELETAL:  No edema; No deformity  SKIN: Warm and dry NEUROLOGIC:  Alert and oriented x 3   EKG: Mar 17, 2018: Normal sinus rhythm at 97.  No ST or T wave changes.  Recent Labs: 12/24/2017: ALT 11; BUN 15; Creat 0.63; Hemoglobin 14.8; Platelets 388; Potassium 4.5; Sodium 139; TSH 3.21    Lipid Panel    Component Value Date/Time   CHOL 208 (H) 12/24/2017 0818   TRIG 126 12/24/2017 0818   HDL 50 (L) 12/24/2017 0818   CHOLHDL 4.2 12/24/2017 0818   VLDL 22 03/11/2015 0847   LDLCALC 134 (H) 12/24/2017 0818      Wt Readings from Last 3 Encounters:  03/17/18 228 lb (103.4 kg)  12/29/17 228 lb 9.6 oz (103.7 kg)  04/21/17 221 lb (100.2 kg)      Other studies Reviewed: Additional studies/ records that were reviewed today include: . Review of the above records demonstrates:    ASSESSMENT AND PLAN:  1.   Palpitations-   [name] is doing fairly well.  She has sinus tachycardia-at least partially due to her Adderall therapy.  She seems to be doing well on metoprolol 50 mg  twice a day.  Her heart rate is still at the upper limits of normal but I hesitate to increase her metoprolol dose because her blood pressure is on the low side.  She is also having occasional episodes of orthostatic hypotension.  Her heart rate will improve with weight loss.  Continue current medications.   2.  Chronic diastolic congestive heart failure: [name] was noted to have chronic diastolic CHF.  She is on low-dose Lasix.  She is also on metoprolol.  She is not having any symptoms  at present.  She does have very trace left leg edema but this is different.  Her clearance to donate plasma if she likes..  Will see her in 1 year    Current medicines are reviewed at length with the patient today.  The patient does not have concerns regarding medicines.  The following changes have been made:  no change  Labs/ tests ordered today include:   Orders Placed This Encounter  Procedures  . EKG 12-Lead           Mertie Moores, MD  03/17/2018 12:32 PM    [facility] [place], [place], [place]  [address] Phone: [phone]; Fax: [phone]

## 2018-03-17 NOTE — Patient Instructions (Signed)
Medication Instructions:  Your physician recommends that you continue on your current medications as directed. Please refer to the Current Medication list given to you today.   Labwork: None Ordered   Testing/Procedures: None Ordered   Follow-Up: Your physician wants you to follow-up in: 1 year with Dr. Nahser.  You will receive a reminder letter in the mail two months in advance. If you don't receive a letter, please call our office to schedule the follow-up appointment.   If you need a refill on your cardiac medications before your next appointment, please call your pharmacy.   Thank you for choosing [facility]! Chisom Muntean, RN [phone]    

## 2018-04-01 ENCOUNTER — Ambulatory Visit: Payer: 59 | Admitting: Cardiovascular Disease

## 2018-05-04 ENCOUNTER — Other Ambulatory Visit (INDEPENDENT_AMBULATORY_CARE_PROVIDER_SITE_OTHER): Payer: Self-pay

## 2018-05-04 DIAGNOSIS — M79662 Pain in left lower leg: Secondary | ICD-10-CM

## 2018-05-04 DIAGNOSIS — M79605 Pain in left leg: Secondary | ICD-10-CM

## 2018-05-04 DIAGNOSIS — M7989 Other specified soft tissue disorders: Secondary | ICD-10-CM

## 2018-05-10 ENCOUNTER — Ambulatory Visit (HOSPITAL_COMMUNITY)
Admission: RE | Admit: 2018-05-10 | Discharge: 2018-05-10 | Disposition: A | Discharge status: Home / Self Care | Payer: 59 | Source: Ambulatory Visit | Attending: Physician Assistant | Admitting: Physician Assistant

## 2018-05-10 ENCOUNTER — Encounter (INDEPENDENT_AMBULATORY_CARE_PROVIDER_SITE_OTHER): Payer: Self-pay | Admitting: Surgery

## 2018-05-10 DIAGNOSIS — M79662 Pain in left lower leg: Secondary | ICD-10-CM

## 2018-05-10 DIAGNOSIS — M7989 Other specified soft tissue disorders: Secondary | ICD-10-CM

## 2018-05-10 NOTE — Progress Notes (Unsigned)
47 year old white female was seen by me today for worsening left calf pain and swelling.  Patient is status post left knee arthroscopy with debridement by Dr. Jean Rosenthal early March 2019.   Over the last several weeks she has had increased pain and swelling.  Worse with ambulation.  No complaints of chest pain, shortness of breath.  Pain medial joint line and posterior knee that extends down into the calf.  Patient spoke with Benita Stabile, PA-C regarding her symptoms and venous Doppler left lower extremity was ordered May 04, 2018.  Patient has not had this study yet.    Exam Patient alert and oriented.  No acute distress.  gait is antalgic.  Left knee range of motion about 0 to 110 degrees with discomfort.  Does have some knee swelling without large effusion.  No signs of infection.  She is moderate to markedly tender at the medial portal scar.  Posterior knee swelling and also tender.  Left calf is markedly tender.  3+ pretibial pitting edema on the left.  Assessment Status post left knee arthroscopy with increased postop left calf pain and swelling.  Plan Recommend that patient proceed with left lower extremity venous Doppler as soon as possible to rule out DVT.  Will see if we can get this done today.  We will have report sent to Dr. Ninfa Linden and his PA Benita Stabile.  All questions answered.

## 2018-05-10 NOTE — Progress Notes (Signed)
*  Preliminary Results* Left lower extremity venous duplex completed. Left lower extremity is negative for deep vein thrombosis. There is no evidence of left Baker's cyst.  Incidental finding: there is a heterogenous area of the left popliteal fossa/ proximal posterior/medial calf measuring 4.3cm, suggestive of possible hematoma versus muscle tear versus unknown etiology.   Preliminary results discussed with Dr. Rush Farmer.  05/10/2018 2:47 PM  Maudry Mayhew, BS, RVT, RDCS, RDMS

## 2018-05-16 ENCOUNTER — Other Ambulatory Visit: Payer: Self-pay | Admitting: Physician Assistant

## 2018-05-16 MED FILL — FUROSEMIDE 20 MG TABS: 20 | 45 days supply | Qty: 90 | Fill #0

## 2018-05-18 ENCOUNTER — Encounter (HOSPITAL_BASED_OUTPATIENT_CLINIC_OR_DEPARTMENT_OTHER): Payer: 59

## 2018-06-15 MED FILL — METOPROLOL TARTRATE 50 MG T: 50 | 30 days supply | Qty: 60 | Fill #1

## 2018-07-21 MED FILL — FUROSEMIDE 20 MG TABS: 20 | 45 days supply | Qty: 90 | Fill #1

## 2018-08-03 ENCOUNTER — Other Ambulatory Visit: Payer: Self-pay | Admitting: Physician Assistant

## 2018-08-03 DIAGNOSIS — F988 Other specified behavioral and emotional disorders with onset usually occurring in childhood and adolescence: Secondary | ICD-10-CM

## 2018-08-03 NOTE — Telephone Encounter (Signed)
Left message for patient to call back regarding MyChart message 

## 2018-08-03 NOTE — Telephone Encounter (Signed)
Last OV 12/29/2017 Last refill 02/23/2018 Ok to refill?

## 2018-08-04 ENCOUNTER — Other Ambulatory Visit: Payer: Self-pay

## 2018-08-04 DIAGNOSIS — R000 Tachycardia, unspecified: Secondary | ICD-10-CM

## 2018-08-04 DIAGNOSIS — J45901 Unspecified asthma with (acute) exacerbation: Secondary | ICD-10-CM

## 2018-08-04 MED ORDER — METOPROLOL TARTRATE 50 MG PO TABS: 50.0000 mg | ORAL_TABLET | Freq: Every day | ORAL | 11 refills | Status: DC

## 2018-08-04 MED ORDER — AMPHETAMINE-DEXTROAMPHETAMINE 20 MG PO TABS: 20.0000 mg | ORAL_TABLET | Freq: Two times a day (BID) | ORAL | 0 refills | Status: DC

## 2018-08-04 MED FILL — DEXTROAMP-AMPHETAMIN 20 MG: 20 | 30 days supply | Qty: 60 | Fill #0

## 2018-08-12 ENCOUNTER — Ambulatory Visit (HOSPITAL_COMMUNITY)
Admission: EM | Admit: 2018-08-12 | Discharge: 2018-08-12 | Disposition: A | Discharge status: Home / Self Care | Payer: 59 | Attending: Internal Medicine | Admitting: Internal Medicine

## 2018-08-12 ENCOUNTER — Encounter (HOSPITAL_COMMUNITY): Payer: Self-pay

## 2018-08-12 ENCOUNTER — Telehealth: Payer: Self-pay | Admitting: Cardiovascular Disease

## 2018-08-12 DIAGNOSIS — I5189 Other ill-defined heart diseases: Secondary | ICD-10-CM

## 2018-08-12 DIAGNOSIS — R0609 Other forms of dyspnea: Secondary | ICD-10-CM

## 2018-08-12 MED ORDER — FUROSEMIDE 10 MG/ML IJ SOLN
40.0000 mg | Freq: Once | INTRAMUSCULAR | Status: AC
  Administered 2018-08-12: 40 mg via INTRAMUSCULAR

## 2018-08-12 MED ORDER — FUROSEMIDE 10 MG/ML IJ SOLN
INTRAMUSCULAR | Status: AC
  Filled 2018-08-12: qty 4

## 2018-08-12 NOTE — ED Provider Notes (Signed)
[facility]    CSN: [id number] Arrival date & time: 08/12/18  1537     History   Chief Complaint Chief Complaint  Patient presents with  . SOB, Heart Palpitations, Pain btw Shoulders    HPI [name] is a 47 y.o. female.   She has past history notable for diastolic dysfunction, sleep apnea, borderline hyperlipidemia, and family history of premature coronary disease.  She presents today with 10-day history of increasing exertional breathlessness, particularly with going up steps at work.  Also having episodes of sweating.  She is having some discomfort between her shoulder blades, and an increased frequency of palpitations. She is concerned about the possibility that these symptoms represent coronary disease or heart attack. She is prescribed Lopressor twice daily but this upsets her stomach significantly, she takes it once daily.  She is also currently taking Lasix 40 mg once daily.    HPI  Past Medical History:  Diagnosis Date  . Anxiety   . Asthma   . Fracture dislocation of elbow joint 01/15/2013   right  . GERD (gastroesophageal reflux disease)    Omeprazole as needed, none in 6 mos.  . Obesity   . Sleep apnea    uses CPAP "most nights"    Patient Active Problem List   Diagnosis Date Noted  . ADD (attention deficit disorder) 12/29/2017  . Chronic diastolic CHF (congestive heart failure) ([place]) 08/24/2016  . Sinus tachycardia ([place]) 07/23/2016  . Diastolic dysfunction 67/89/3810  . PVC (premature ventricular contraction) 12/17/2015  . Family history of premature coronary artery disease 03/14/2015  . GERD (gastroesophageal reflux disease) 05/02/2014  . Anal fissure 05/02/2014  . Leg swelling 05/07/2013  . Obesity, unspecified 05/07/2013  . Spasm of muscle 12/05/2010  . Pain in thoracic spine 12/05/2010  . Acute upper respiratory infection 12/05/2010  . Vitamin D deficiency 08/20/2010  . Other abnormal glucose 08/20/2010  . Hypothyroidism  08/20/2010  . Depressive disorder, not elsewhere classified 07/31/2010  . Anxiety state 07/31/2010  . Seborrheic dermatitis, unspecified 09/18/2008    Past Surgical History:  Procedure Laterality Date  . [place]; 10/10/2000   x 2  . KNEE ARTHROSCOPY Left 1991  . LAPAROSCOPIC ASSISTED VAGINAL HYSTERECTOMY  11/02/2006  . LAPAROSCOPIC LYSIS OF ADHESIONS  03/06/2005  . RADIAL HEAD ARTHROPLASTY Right 01/17/2013   Procedure: RIGHT RADIAL HEAD ARTHROPLASTY OPEN REDUCTION INTERNAL FIXATION CORONOID ;  Surgeon: Tennis Must, MD;  Location: [facility];  Service: Orthopedics;  Laterality: Right;  . TUBAL LIGATION  03/06/2005     Home Medications    Prior to Admission medications   Medication Sig Start Date End Date Taking? Authorizing Provider  albuterol (PROVENTIL HFA;VENTOLIN HFA) 108 (90 Base) MCG/ACT inhaler Inhale 2 puffs into the lungs every 6 (six) hours as needed for wheezing. 12/02/15   Orlena Sheldon, PA-C  ALPRAZolam Duanne Moron) 0.25 MG tablet Take 1 tablet (0.25 mg total) by mouth daily as needed for anxiety. 10/01/16   Orlena Sheldon, PA-C  amphetamine-dextroamphetamine (ADDERALL) 20 MG tablet Take 1 tablet (20 mg total) by mouth 2 (two) times daily. Patient taking differently: Take 20 mg by mouth daily.  08/04/18   Orlena Sheldon, PA-C  aspirin EC 81 MG tablet Take 81 mg by mouth daily.    [provider]  furosemide (LASIX) 20 MG tablet Take 40 mg by mouth daily.     [provider]  ibuprofen (ADVIL,MOTRIN) 200 MG tablet Take 400 mg by  mouth 2 (two) times daily as needed for headache or moderate pain.    [provider]  metoprolol tartrate (LOPRESSOR) 50 MG tablet Take 1.5 tablets (75 mg total) by mouth 2 (two) times daily. 08/15/18   Richardson Dopp T, PA-C  nitroGLYCERIN (NITROSTAT) 0.4 MG SL tablet Place 1 tablet (0.4 mg total) under the tongue every 5 (five) minutes as needed for chest pain. 08/15/18   Liliane Shi, PA-C     Family History Family History  Problem Relation Age of Onset  . Diabetes Sister   . Heart disease Father 25       MI--1st Dx CAD. Died2that time  . Diabetes Mother   . Hypertension Mother   . Heart disease Maternal Uncle   . Ovarian cancer Maternal Grandmother   . Diabetes Maternal Grandmother   . Heart disease Maternal Grandmother        Event organiser  . Diabetes Paternal Grandmother   . Liver disease Paternal Aunt   . Heart disease Maternal Grandfather     Social History Social History   Tobacco Use  . Smoking status: Never Smoker  . Smokeless tobacco: Never Used  Substance Use Topics  . Alcohol use: No  . Drug use: No     Allergies   Adhesive [tape] and Latex   Review of Systems Review of Systems  All other systems reviewed and are negative.    Physical Exam Triage Vital Signs ED Triage Vitals [08/12/18 1553]  Enc Vitals Group     BP 136/89     Pulse Rate 91     Resp 20     Temp 97.8 F (36.6 C)     Temp Source Oral     SpO2 94 %     Weight      Height      Pain Score      Pain Loc    Updated Vital Signs BP 136/89 (BP Location: Left Arm)   Pulse 91   Temp 97.8 F (36.6 C) (Oral)   Resp 20   SpO2 94%  Physical Exam  Constitutional: She is oriented to person, place, and time. No distress.  HENT:  Head: Atraumatic.  Eyes:  Conjugate gaze observed, no eye redness/discharge  Neck: Neck supple.  Cardiovascular: Normal rate and regular rhythm.  Pulmonary/Chest: No respiratory distress. She has no wheezes. She has no rales.  Lungs clear, symmetric breath sounds   Abdominal: She exhibits no distension.  Musculoskeletal: Normal range of motion.  2+ pitting edema in the left lower extremity, 1+ pitting edema in the right lower extremity  Neurological: She is alert and oriented to person, place, and time.  Skin: Skin is warm and dry.  Pink no cyanosis  Nursing note and vitals reviewed.    UC Treatments / Results   EKG Sinus  rhythm, non specific TW changes, no acute ST/T wave changes    Final Clinical Impressions(s) / UC Diagnoses   Final diagnoses:  Exertional dyspnea  Diastolic dysfunction     Discharge Instructions     ECG at urgent care this afternoon is without acute findings.  Injection of lasix 40mg  IM was given at the urgent care, to help with breathlessness.  Please temporarily increase your lasix to 40mg  twice daily, and followup with your cardiologist's office Monday 9/30 to discuss next steps in checking out/managing your breathlessness.  Continue with lopressor 50mg  once daily as you are doing.  Please go to ER if marked increase in breathlessness  or chest discomfort occurs.       Wynona Luna, MD 08/15/18 [phone]

## 2018-08-12 NOTE — Discharge Instructions (Addendum)
ECG at urgent care this afternoon is without acute findings.  Injection of lasix 40mg  IM was given at the urgent care, to help with breathlessness.  Please temporarily increase your lasix to 40mg  twice daily, and followup with your cardiologist's office Monday 9/30 to discuss next steps in checking out/managing your breathlessness.  Continue with lopressor 50mg  once daily as you are doing.  Please go to ER if marked increase in breathlessness or chest discomfort occurs.

## 2018-08-12 NOTE — Telephone Encounter (Signed)
Per pt feels numbness ,SOB, and pain in between shoulders reviewed instructions from Dr Acie Fredrickson from today and reiterated his response and maybe follow up with PMD for symptoms Pt will continue to monitor and will follow up with PMD.Also will go to ED if has worsening S/S./cy

## 2018-08-12 NOTE — Telephone Encounter (Signed)
° °  Patient requesting a call regarding MyChart message sent today. Declined appt with APP on 9/30. Please call

## 2018-08-12 NOTE — ED Triage Notes (Signed)
Pt presents with SOB, Heart Palpitations and Pain between Shoulder Blades.

## 2018-08-15 ENCOUNTER — Encounter: Payer: Self-pay | Admitting: Physician Assistant

## 2018-08-15 ENCOUNTER — Ambulatory Visit: Payer: 59 | Admitting: Physician Assistant

## 2018-08-15 ENCOUNTER — Encounter: Payer: Self-pay | Admitting: *Deleted

## 2018-08-15 VITALS — BP 154/80 | HR 105 | Ht 64.0 in | Wt 236.0 lb

## 2018-08-15 DIAGNOSIS — I10 Essential (primary) hypertension: Secondary | ICD-10-CM | POA: Diagnosis not present

## 2018-08-15 DIAGNOSIS — R079 Chest pain, unspecified: Secondary | ICD-10-CM

## 2018-08-15 DIAGNOSIS — I5032 Chronic diastolic (congestive) heart failure: Secondary | ICD-10-CM

## 2018-08-15 DIAGNOSIS — R000 Tachycardia, unspecified: Secondary | ICD-10-CM

## 2018-08-15 DIAGNOSIS — R0602 Shortness of breath: Secondary | ICD-10-CM | POA: Diagnosis not present

## 2018-08-15 LAB — BASIC METABOLIC PANEL
BUN/Creatinine Ratio: 18 (ref 9–23)
BUN: 10 mg/dL (ref 6–24)
CALCIUM: 9.5 mg/dL (ref 8.7–10.2)
CO2: 27 mmol/L (ref 20–29)
Chloride: 102 mmol/L (ref 96–106)
Creatinine, Ser: 0.55 mg/dL — ABNORMAL LOW (ref 0.57–1.00)
GFR, EST AFRICAN AMERICAN: 132 mL/min/{1.73_m2} (ref >59)
GFR, EST NON AFRICAN AMERICAN: 114 mL/min/{1.73_m2} (ref >59)
Glucose: 82 mg/dL (ref 65–99)
Potassium: 3.7 mmol/L (ref 3.5–5.2)
Sodium: 138 mmol/L (ref 134–144)

## 2018-08-15 LAB — D-DIMER, QUANTITATIVE: D-DIMER: <0.2 mg/L FEU (ref 0.00–0.49)

## 2018-08-15 LAB — CBC
HEMATOCRIT: 40.2 % (ref 34.0–46.6)
Hemoglobin: 13.5 g/dL (ref 11.1–15.9)
MCH: 29 pg (ref 26.6–33.0)
MCHC: 33.6 g/dL (ref 31.5–35.7)
MCV: 86 fL (ref 79–97)
Platelets: 314 10*3/uL (ref 150–450)
RBC: 4.66 x10E6/uL (ref 3.77–5.28)
RDW: 13.9 % (ref 12.3–15.4)
WBC: 13 10*3/uL — AB (ref 3.4–10.8)

## 2018-08-15 MED ORDER — METOPROLOL TARTRATE 50 MG PO TABS: 75.0000 mg | ORAL_TABLET | Freq: Two times a day (BID) | ORAL | 6 refills | Status: DC

## 2018-08-15 MED ORDER — NITROGLYCERIN 0.4 MG SL SUBL: 0.4000 mg | SUBLINGUAL_TABLET | SUBLINGUAL | 3 refills | Status: DC | PRN

## 2018-08-15 MED FILL — NITROGLYCERIN 0.4 MG TAB SL: 0.4 | 7 days supply | Qty: 25 | Fill #0

## 2018-08-15 NOTE — Progress Notes (Signed)
Cardiology Office Note:    Date:  08/15/2018   ID:  [name], DOB [birth year], MRN [id number]  PCP:  Orlena Sheldon, PA-C  Cardiologist:  Mertie Moores, MD   Electrophysiologist:  None   Referring MD: Orlena Sheldon, PA-C   Chief Complaint  Patient presents with  . Shortness of Breath  . Chest Pain     History of Present Illness:    [name] is a 47 y.o. female with diastolic congestive heart failure, sinus tachycardia, hypertension, obstructive sleep apnea, ADHD.  Her sinus tachycardia is felt to be Last seen by Dr. Acie Fredrickson in 03/2018.    [name] returns for evaluation of chest pain and shortness of breath.  She is here alone.  She has had progressively worsening shortness of breath with exertion over the past month.  She has noted worsening palpitations with her symptoms.  She has associated interscapular pain as well as substernal and L sided chest pain.  She feels that her symptoms are associated with exertion.  She has felt dizzy and near syncopal when she is breathless.  She denies paroxysmal nocturnal dyspnea. She has noted some increased leg swelling.  Her L leg is always larger than her R.  She did go to urgent care last week.  Her swelling improved somewhat.  She has not noticed any improvement in her breathing.  Of note, she did travel to the beach a couple weeks before her symptoms started.  She has noted some pleuritic chest pain at times.    Prior CV studies:   The following studies were reviewed today:  Echo 10/25/2015 EF 60-65, normal wall motion, grade 1 diastolic dysfunction, GLS -19.1%  Holter 10/2015 NSR  Past Medical History:  Diagnosis Date  . Anxiety   . Asthma   . Fracture dislocation of elbow joint 01/15/2013   right  . GERD (gastroesophageal reflux disease)    Omeprazole as needed, none in 6 mos.  . Obesity   . Sleep apnea    uses CPAP "most nights"   Surgical Hx: The patient  has a past surgical history that includes Knee  arthroscopy (Left, 1991); Laparoscopic assisted vaginal hysterectomy (11/02/2006); Tubal ligation (03/06/2005); Laparoscopic lysis of adhesions (03/06/2005); Cesarean section (1998; 10/10/2000); and Radial head arthroplasty (Right, 01/17/2013).   Current Medications: Current Meds  Medication Sig  . albuterol (PROVENTIL HFA;VENTOLIN HFA) 108 (90 Base) MCG/ACT inhaler Inhale 2 puffs into the lungs every 6 (six) hours as needed for wheezing.  Marland Kitchen ALPRAZolam (XANAX) 0.25 MG tablet Take 1 tablet (0.25 mg total) by mouth daily as needed for anxiety.  Marland Kitchen amphetamine-dextroamphetamine (ADDERALL) 20 MG tablet Take 1 tablet (20 mg total) by mouth 2 (two) times daily.  Marland Kitchen aspirin EC 81 MG tablet Take 81 mg by mouth daily.  . furosemide (LASIX) 20 MG tablet Take 40 mg by mouth 2 (two) times daily.  . metoprolol tartrate (LOPRESSOR) 50 MG tablet Take 1.5 tablets (75 mg total) by mouth 2 (two) times daily.  . [DISCONTINUED] metoprolol tartrate (LOPRESSOR) 50 MG tablet Take 1 tablet (50 mg total) by mouth daily.     Allergies:   Adhesive [tape] and Latex   Social History   Tobacco Use  . Smoking status: Never Smoker  . Smokeless tobacco: Never Used  Substance Use Topics  . Alcohol use: No  . Drug use: No     Family Hx: The patient's family history includes Diabetes in her maternal grandmother, mother, paternal grandmother, and sister;  Heart disease in her maternal grandfather, maternal grandmother, and maternal uncle; Heart disease (age of onset: 68) in her father; Hypertension in her mother; Liver disease in her paternal aunt; Ovarian cancer in her maternal grandmother.  ROS:   Please see the history of present illness.    Review of Systems  Cardiovascular: Positive for chest pain and irregular heartbeat.  Respiratory: Positive for shortness of breath and wheezing.   Neurological: Positive for dizziness and headaches.   All other systems reviewed and are negative.   EKGs/Labs/Other Test Reviewed:     EKG:  EKG is  ordered today.  The ekg ordered today demonstrates sinus tachy, HR 105, normal axis, QTc 502, non-specific ST-TW changes.   Recent Labs: 12/24/2017: ALT 11; BUN 15; Creat 0.63; Hemoglobin 14.8; Platelets 388; Potassium 4.5; Sodium 139; TSH 3.21   Recent Lipid Panel Lab Results  Component Value Date/Time   CHOL 208 (H) 12/24/2017 08:18 AM   TRIG 126 12/24/2017 08:18 AM   HDL 50 (L) 12/24/2017 08:18 AM   CHOLHDL 4.2 12/24/2017 08:18 AM   LDLCALC 134 (H) 12/24/2017 08:18 AM    Physical Exam:    VS:  BP (!) 154/80   Pulse (!) 105   Ht 5\' 4"  (1.626 m)   Wt 236 lb (107 kg)   SpO2 97%   BMI 40.51 kg/m     Wt Readings from Last 3 Encounters:  08/15/18 236 lb (107 kg)  03/17/18 228 lb (103.4 kg)  12/29/17 228 lb 9.6 oz (103.7 kg)     Physical Exam  Constitutional: She is oriented to person, place, and time. She appears well-developed and well-nourished. No distress.  HENT:  Head: Normocephalic and atraumatic.  Eyes: No scleral icterus.  Neck: No JVD present. No thyromegaly present.  Cardiovascular: Regular rhythm. Tachycardia present.  No murmur heard. Pulmonary/Chest: Effort normal. She has no wheezes. She has no rales.  Abdominal: Soft. She exhibits no distension.  Musculoskeletal: She exhibits no edema.  Lymphadenopathy:    She has no cervical adenopathy.  Neurological: She is alert and oriented to person, place, and time.  Skin: Skin is warm and dry.  Psychiatric: She has a normal mood and affect.    ASSESSMENT & PLAN:    Exertional chest pain The patient presents with progressively worsening chest pain and shortness of breath with exertion.  She has a significant family hx of CAD.  Her father died of a massive heart attack in his 66s.  Her sister, who is also in her 29s recently had a stent placed.  She has a hx of elevated cholesterol and is not on medical therapy.  Other risk factors include uncontrolled hypertension.  Her ECG today does not  demonstrate any acute findings.  She was given extra Lasix last week with little to no improvement.  Her weight is up since her last visit.  But, her exam today does not suggest volume overload.  I have recommended proceeding with R and L Cardiac Catheterization to better evaluate her symptoms.  I discussed this with Dr. Saunders Revel (attending MD) who agreed.  Risks and benefits of cardiac catheterization have been discussed with the patient.  These include bleeding, infection, kidney damage, stroke, heart attack, death.  The patient understands these risks and is willing to proceed.   -Labs today: BMET, CBC, DDimer  -Arrange R and L heart cath this week  -Increase Metoprolol to 75 mg bid   -Start ASA 81 mg QD  -Rx for prn NTG given  -  Follow up 2 weeks after Cardiac Catheterization   SOB (shortness of breath)  O2 sat on RA is normal.  As noted, her exam does not suggest volume excess.  She is tachycardic and has had some pleuritic chest pain at times.  She thinks she may have a FHx of pulmonary embolism.  She did travel shortly before her symptoms began.  -Obtain DDimer today  -If elevated, obtain Chest CTA   Chronic diastolic heart failure ([place])  As noted, volume status appears stable on exam.  Continue current dose of diuretic.  Proceed with R and L heart cath.   Essential hypertension BP above target.  Increase Metoprolol as noted.     Dispo:  Return in about 2 weeks (around 08/29/2018) for Post Procedure Follow Up, w/ Dr. Acie Fredrickson, or Richardson Dopp, PA-C.   Medication Adjustments/Labs and Tests Ordered: Current medicines are reviewed at length with the patient today.  Concerns regarding medicines are outlined above.  Tests Ordered: Orders Placed This Encounter  Procedures  . CBC  . Basic metabolic panel  . D-dimer, quantitative (not at [facility])  . EKG 12-Lead   Medication Changes: Meds ordered this encounter  Medications  . metoprolol tartrate (LOPRESSOR) 50 MG tablet    Sig: Take 1.5  tablets (75 mg total) by mouth 2 (two) times daily.    Dispense:  90 tablet    Refill:  6  . nitroGLYCERIN (NITROSTAT) 0.4 MG SL tablet    Sig: Place 1 tablet (0.4 mg total) under the tongue every 5 (five) minutes as needed for chest pain.    Dispense:  25 tablet    Refill:  3    Signed, Richardson Dopp, PA-C  08/15/2018 1:25 PM    [facility] [place], [place], [place]  [address] Phone: [phone]; Fax: [phone]

## 2018-08-15 NOTE — Patient Instructions (Addendum)
Medication Instructions:    START TAKING ASPIRIN 81 MG ONCE DAY    START TAKING METOPROLOL 75 MG TWICE DAY     START TAKING NITROGLYCERIN AS NEEDED FOR CHEST PAIN   If you need a refill on your cardiac medications before your next appointment, please call your pharmacy.  Labwork:  CBC BMET AND D-DIMER  TODAY    Testing/Procedures: SEE LETTER FOR RIGHT AND LEFT HEART CATH    Follow-Up: 2 WEEKS POST CATH  WITH NAHSER OR WEAVER  AFTER 08-18-18   Any Other Special Instructions Will Be Listed Below (If Applicable).

## 2018-08-15 NOTE — H&P (View-Only) (Signed)
Cardiology Office Note:    Date:  08/15/2018   ID:  [name], DOB 30-Aug-1971, MRN [id number]  PCP:  Orlena Sheldon, PA-C  Cardiologist:  Mertie Moores, MD   Electrophysiologist:  None   Referring MD: Orlena Sheldon, PA-C   Chief Complaint  Patient presents with  . Shortness of Breath  . Chest Pain     History of Present Illness:    [name] is a 47 y.o. female with diastolic congestive heart failure, sinus tachycardia, hypertension, obstructive sleep apnea, ADHD.  Her sinus tachycardia is felt to be Last seen by Dr. Acie Fredrickson in 03/2018.    [name] returns for evaluation of chest pain and shortness of breath.  She is here alone.  She has had progressively worsening shortness of breath with exertion over the past month.  She has noted worsening palpitations with her symptoms.  She has associated interscapular pain as well as substernal and L sided chest pain.  She feels that her symptoms are associated with exertion.  She has felt dizzy and near syncopal when she is breathless.  She denies paroxysmal nocturnal dyspnea. She has noted some increased leg swelling.  Her L leg is always larger than her R.  She did go to urgent care last week.  Her swelling improved somewhat.  She has not noticed any improvement in her breathing.  Of note, she did travel to the beach a couple weeks before her symptoms started.  She has noted some pleuritic chest pain at times.    Prior CV studies:   The following studies were reviewed today:  Echo 10/25/2015 EF 60-65, normal wall motion, grade 1 diastolic dysfunction, GLS -19.1%  Holter 10/2015 NSR  Past Medical History:  Diagnosis Date  . Anxiety   . Asthma   . Fracture dislocation of elbow joint 01/15/2013   right  . GERD (gastroesophageal reflux disease)    Omeprazole as needed, none in 6 mos.  . Obesity   . Sleep apnea    uses CPAP "most nights"   Surgical Hx: The patient  has a past surgical history that includes Knee  arthroscopy (Left, 1991); Laparoscopic assisted vaginal hysterectomy (11/02/2006); Tubal ligation (03/06/2005); Laparoscopic lysis of adhesions (03/06/2005); Cesarean section (1998; 10/10/2000); and Radial head arthroplasty (Right, 01/17/2013).   Current Medications: Current Meds  Medication Sig  . albuterol (PROVENTIL HFA;VENTOLIN HFA) 108 (90 Base) MCG/ACT inhaler Inhale 2 puffs into the lungs every 6 (six) hours as needed for wheezing.  Marland Kitchen ALPRAZolam (XANAX) 0.25 MG tablet Take 1 tablet (0.25 mg total) by mouth daily as needed for anxiety.  Marland Kitchen amphetamine-dextroamphetamine (ADDERALL) 20 MG tablet Take 1 tablet (20 mg total) by mouth 2 (two) times daily.  Marland Kitchen aspirin EC 81 MG tablet Take 81 mg by mouth daily.  . furosemide (LASIX) 20 MG tablet Take 40 mg by mouth 2 (two) times daily.  . metoprolol tartrate (LOPRESSOR) 50 MG tablet Take 1.5 tablets (75 mg total) by mouth 2 (two) times daily.  . [DISCONTINUED] metoprolol tartrate (LOPRESSOR) 50 MG tablet Take 1 tablet (50 mg total) by mouth daily.     Allergies:   Adhesive [tape] and Latex   Social History   Tobacco Use  . Smoking status: Never Smoker  . Smokeless tobacco: Never Used  Substance Use Topics  . Alcohol use: No  . Drug use: No     Family Hx: The patient's family history includes Diabetes in her maternal grandmother, mother, paternal grandmother, and sister;  Heart disease in her maternal grandfather, maternal grandmother, and maternal uncle; Heart disease (age of onset: 70) in her father; Hypertension in her mother; Liver disease in her paternal aunt; Ovarian cancer in her maternal grandmother.  ROS:   Please see the history of present illness.    Review of Systems  Cardiovascular: Positive for chest pain and irregular heartbeat.  Respiratory: Positive for shortness of breath and wheezing.   Neurological: Positive for dizziness and headaches.   All other systems reviewed and are negative.   EKGs/Labs/Other Test Reviewed:     EKG:  EKG is  ordered today.  The ekg ordered today demonstrates sinus tachy, HR 105, normal axis, QTc 502, non-specific ST-TW changes.   Recent Labs: 12/24/2017: ALT 11; BUN 15; Creat 0.63; Hemoglobin 14.8; Platelets 388; Potassium 4.5; Sodium 139; TSH 3.21   Recent Lipid Panel Lab Results  Component Value Date/Time   CHOL 208 (H) 12/24/2017 08:18 AM   TRIG 126 12/24/2017 08:18 AM   HDL 50 (L) 12/24/2017 08:18 AM   CHOLHDL 4.2 12/24/2017 08:18 AM   LDLCALC 134 (H) 12/24/2017 08:18 AM    Physical Exam:    VS:  BP (!) 154/80   Pulse (!) 105   Ht 5\' 4"  (1.626 m)   Wt 236 lb (107 kg)   SpO2 97%   BMI 40.51 kg/m     Wt Readings from Last 3 Encounters:  08/15/18 236 lb (107 kg)  03/17/18 228 lb (103.4 kg)  12/29/17 228 lb 9.6 oz (103.7 kg)     Physical Exam  Constitutional: She is oriented to person, place, and time. She appears well-developed and well-nourished. No distress.  HENT:  Head: Normocephalic and atraumatic.  Eyes: No scleral icterus.  Neck: No JVD present. No thyromegaly present.  Cardiovascular: Regular rhythm. Tachycardia present.  No murmur heard. Pulmonary/Chest: Effort normal. She has no wheezes. She has no rales.  Abdominal: Soft. She exhibits no distension.  Musculoskeletal: She exhibits no edema.  Lymphadenopathy:    She has no cervical adenopathy.  Neurological: She is alert and oriented to person, place, and time.  Skin: Skin is warm and dry.  Psychiatric: She has a normal mood and affect.    ASSESSMENT & PLAN:    Exertional chest pain The patient presents with progressively worsening chest pain and shortness of breath with exertion.  She has a significant family hx of CAD.  Her father died of a massive heart attack in his 78s.  Her sister, who is also in her 81s recently had a stent placed.  She has a hx of elevated cholesterol and is not on medical therapy.  Other risk factors include uncontrolled hypertension.  Her ECG today does not  demonstrate any acute findings.  She was given extra Lasix last week with little to no improvement.  Her weight is up since her last visit.  But, her exam today does not suggest volume overload.  I have recommended proceeding with R and L Cardiac Catheterization to better evaluate her symptoms.  I discussed this with Dr. Saunders Revel (attending MD) who agreed.  Risks and benefits of cardiac catheterization have been discussed with the patient.  These include bleeding, infection, kidney damage, stroke, heart attack, death.  The patient understands these risks and is willing to proceed.   -Labs today: BMET, CBC, DDimer  -Arrange R and L heart cath this week  -Increase Metoprolol to 75 mg bid   -Start ASA 81 mg QD  -Rx for prn NTG given  -  Follow up 2 weeks after Cardiac Catheterization   SOB (shortness of breath)  O2 sat on RA is normal.  As noted, her exam does not suggest volume excess.  She is tachycardic and has had some pleuritic chest pain at times.  She thinks she may have a FHx of pulmonary embolism.  She did travel shortly before her symptoms began.  -Obtain DDimer today  -If elevated, obtain Chest CTA   Chronic diastolic heart failure ([place])  As noted, volume status appears stable on exam.  Continue current dose of diuretic.  Proceed with R and L heart cath.   Essential hypertension BP above target.  Increase Metoprolol as noted.     Dispo:  Return in about 2 weeks (around 08/29/2018) for Post Procedure Follow Up, w/ Dr. Acie Fredrickson, or Richardson Dopp, PA-C.   Medication Adjustments/Labs and Tests Ordered: Current medicines are reviewed at length with the patient today.  Concerns regarding medicines are outlined above.  Tests Ordered: Orders Placed This Encounter  Procedures  . CBC  . Basic metabolic panel  . D-dimer, quantitative (not at [facility])  . EKG 12-Lead   Medication Changes: Meds ordered this encounter  Medications  . metoprolol tartrate (LOPRESSOR) 50 MG tablet    Sig: Take 1.5  tablets (75 mg total) by mouth 2 (two) times daily.    Dispense:  90 tablet    Refill:  6  . nitroGLYCERIN (NITROSTAT) 0.4 MG SL tablet    Sig: Place 1 tablet (0.4 mg total) under the tongue every 5 (five) minutes as needed for chest pain.    Dispense:  25 tablet    Refill:  3    Signed, Richardson Dopp, PA-C  08/15/2018 1:25 PM    [facility] [place] [address], [place], [place]  [address] Phone: [phone]; Fax: [phone]

## 2018-08-18 ENCOUNTER — Encounter: Payer: Self-pay | Admitting: *Deleted

## 2018-08-18 ENCOUNTER — Encounter (HOSPITAL_COMMUNITY)
Admission: RE | Disposition: A | Discharge status: Home / Self Care | Payer: Self-pay | Source: Ambulatory Visit | Attending: Internal Medicine

## 2018-08-18 ENCOUNTER — Ambulatory Visit (HOSPITAL_COMMUNITY)
Admission: RE | Admit: 2018-08-18 | Discharge: 2018-08-18 | Disposition: A | Discharge status: Home / Self Care | Payer: 59 | Source: Ambulatory Visit | Attending: Internal Medicine | Admitting: Internal Medicine

## 2018-08-18 DIAGNOSIS — R000 Tachycardia, unspecified: Secondary | ICD-10-CM | POA: Diagnosis not present

## 2018-08-18 DIAGNOSIS — R0789 Other chest pain: Secondary | ICD-10-CM | POA: Diagnosis not present

## 2018-08-18 DIAGNOSIS — R0602 Shortness of breath: Secondary | ICD-10-CM | POA: Diagnosis not present

## 2018-08-18 DIAGNOSIS — Z90710 Acquired absence of both cervix and uterus: Secondary | ICD-10-CM | POA: Insufficient documentation

## 2018-08-18 DIAGNOSIS — F419 Anxiety disorder, unspecified: Secondary | ICD-10-CM | POA: Diagnosis not present

## 2018-08-18 DIAGNOSIS — I5032 Chronic diastolic (congestive) heart failure: Secondary | ICD-10-CM | POA: Diagnosis not present

## 2018-08-18 DIAGNOSIS — Z888 Allergy status to other drugs, medicaments and biological substances status: Secondary | ICD-10-CM | POA: Insufficient documentation

## 2018-08-18 DIAGNOSIS — Z8249 Family history of ischemic heart disease and other diseases of the circulatory system: Secondary | ICD-10-CM | POA: Insufficient documentation

## 2018-08-18 DIAGNOSIS — Z7982 Long term (current) use of aspirin: Secondary | ICD-10-CM | POA: Diagnosis not present

## 2018-08-18 DIAGNOSIS — F909 Attention-deficit hyperactivity disorder, unspecified type: Secondary | ICD-10-CM | POA: Diagnosis not present

## 2018-08-18 DIAGNOSIS — Z9889 Other specified postprocedural states: Secondary | ICD-10-CM | POA: Diagnosis not present

## 2018-08-18 DIAGNOSIS — R079 Chest pain, unspecified: Secondary | ICD-10-CM

## 2018-08-18 DIAGNOSIS — I110 Hypertensive heart disease with heart failure: Secondary | ICD-10-CM | POA: Diagnosis not present

## 2018-08-18 DIAGNOSIS — I219 Acute myocardial infarction, unspecified: Secondary | ICD-10-CM | POA: Insufficient documentation

## 2018-08-18 DIAGNOSIS — Z6841 Body Mass Index (BMI) 40.0 and over, adult: Secondary | ICD-10-CM | POA: Insufficient documentation

## 2018-08-18 DIAGNOSIS — K219 Gastro-esophageal reflux disease without esophagitis: Secondary | ICD-10-CM | POA: Insufficient documentation

## 2018-08-18 DIAGNOSIS — G4733 Obstructive sleep apnea (adult) (pediatric): Secondary | ICD-10-CM | POA: Diagnosis not present

## 2018-08-18 DIAGNOSIS — J45909 Unspecified asthma, uncomplicated: Secondary | ICD-10-CM | POA: Insufficient documentation

## 2018-08-18 DIAGNOSIS — Z79899 Other long term (current) drug therapy: Secondary | ICD-10-CM | POA: Insufficient documentation

## 2018-08-18 DIAGNOSIS — Z006 Encounter for examination for normal comparison and control in clinical research program: Secondary | ICD-10-CM

## 2018-08-18 DIAGNOSIS — E7800 Pure hypercholesterolemia, unspecified: Secondary | ICD-10-CM | POA: Insufficient documentation

## 2018-08-18 DIAGNOSIS — E669 Obesity, unspecified: Secondary | ICD-10-CM | POA: Diagnosis not present

## 2018-08-18 HISTORY — PX: RIGHT/LEFT HEART CATH AND CORONARY ANGIOGRAPHY: CATH118266

## 2018-08-18 HISTORY — DX: Other specified postprocedural states: Z98.890

## 2018-08-18 LAB — POCT I-STAT 3, ART BLOOD GAS (G3+)
ACID-BASE DEFICIT: 1 mmol/L (ref 0.0–2.0)
Bicarbonate: 22.9 mmol/L (ref 20.0–28.0)
O2 SAT: 96 %
TCO2: 24 mmol/L (ref 22–32)
pCO2 arterial: 36.4 mmHg (ref 32.0–48.0)
pH, Arterial: 7.407 (ref 7.350–7.450)
pO2, Arterial: 81 mmHg — ABNORMAL LOW (ref 83.0–108.0)

## 2018-08-18 LAB — POCT I-STAT 3, VENOUS BLOOD GAS (G3P V)
Acid-base deficit: 1 mmol/L (ref 0.0–2.0)
Bicarbonate: 23.4 mmol/L (ref 20.0–28.0)
O2 Saturation: 77 %
TCO2: 25 mmol/L (ref 22–32)
pCO2, Ven: 39.1 mmHg — ABNORMAL LOW (ref 44.0–60.0)
pH, Ven: 7.386 (ref 7.250–7.430)
pO2, Ven: 42 mmHg (ref 32.0–45.0)

## 2018-08-18 SURGERY — RIGHT/LEFT HEART CATH AND CORONARY ANGIOGRAPHY
Anesthesia: LOCAL

## 2018-08-18 MED ORDER — ONDANSETRON HCL 4 MG/2ML IJ SOLN: 4.0000 mg | Freq: Four times a day (QID) | INTRAMUSCULAR | Status: DC | PRN

## 2018-08-18 MED ORDER — LIDOCAINE HCL (PF) 1 % IJ SOLN
INTRAMUSCULAR | Status: DC | PRN
  Administered 2018-08-18: 2 mL
  Administered 2018-08-18: 10 mL
  Administered 2018-08-18: 2 mL

## 2018-08-18 MED ORDER — METOPROLOL TARTRATE 100 MG PO TABS: 100.0000 mg | ORAL_TABLET | Freq: Two times a day (BID) | ORAL | 11 refills | Status: DC

## 2018-08-18 MED ORDER — SODIUM CHLORIDE 0.9 % WEIGHT BASED INFUSION
3.0000 mL/kg/h | INTRAVENOUS | Status: AC
  Administered 2018-08-18: 3 mL/kg/h via INTRAVENOUS

## 2018-08-18 MED ORDER — ASPIRIN 81 MG PO CHEW: 81.0000 mg | CHEWABLE_TABLET | ORAL | Status: DC

## 2018-08-18 MED ORDER — HEPARIN (PORCINE) IN NACL 1000-0.9 UT/500ML-% IV SOLN
INTRAVENOUS | Status: DC | PRN
  Administered 2018-08-18 (×3): 500 mL

## 2018-08-18 MED ORDER — VERAPAMIL HCL 2.5 MG/ML IV SOLN
INTRAVENOUS | Status: AC
  Filled 2018-08-18: qty 2

## 2018-08-18 MED ORDER — SODIUM CHLORIDE 0.9 % WEIGHT BASED INFUSION: 1.0000 mL/kg/h | INTRAVENOUS | Status: DC

## 2018-08-18 MED ORDER — MIDAZOLAM HCL 2 MG/2ML IJ SOLN
INTRAMUSCULAR | Status: DC | PRN
  Administered 2018-08-18 (×2): 1 mg via INTRAVENOUS

## 2018-08-18 MED ORDER — IOHEXOL 350 MG/ML SOLN
INTRAVENOUS | Status: DC | PRN
  Administered 2018-08-18: 90 mL via INTRACARDIAC

## 2018-08-18 MED ORDER — FENTANYL CITRATE (PF) 100 MCG/2ML IJ SOLN
INTRAMUSCULAR | Status: AC
  Filled 2018-08-18: qty 2

## 2018-08-18 MED ORDER — SODIUM CHLORIDE 0.9% FLUSH: 3.0000 mL | INTRAVENOUS | Status: DC | PRN

## 2018-08-18 MED ORDER — FUROSEMIDE 20 MG PO TABS: 40.0000 mg | ORAL_TABLET | Freq: Every day | ORAL | Status: DC | PRN

## 2018-08-18 MED ORDER — VERAPAMIL HCL 2.5 MG/ML IV SOLN
INTRAVENOUS | Status: DC | PRN
  Administered 2018-08-18: 10 mL via INTRA_ARTERIAL

## 2018-08-18 MED ORDER — ACETAMINOPHEN 325 MG PO TABS: 650.0000 mg | ORAL_TABLET | ORAL | Status: DC | PRN

## 2018-08-18 MED ORDER — HEPARIN SODIUM (PORCINE) 1000 UNIT/ML IJ SOLN
INTRAMUSCULAR | Status: DC | PRN
  Administered 2018-08-18: 5000 [IU] via INTRAVENOUS

## 2018-08-18 MED ORDER — FENTANYL CITRATE (PF) 100 MCG/2ML IJ SOLN
INTRAMUSCULAR | Status: DC | PRN
  Administered 2018-08-18 (×3): 25 ug via INTRAVENOUS

## 2018-08-18 MED ORDER — SODIUM CHLORIDE 0.9 % IV SOLN
INTRAVENOUS | Status: DC
  Administered 2018-08-18: 16:00:00 via INTRAVENOUS

## 2018-08-18 MED ORDER — MIDAZOLAM HCL 2 MG/2ML IJ SOLN
INTRAMUSCULAR | Status: AC
  Filled 2018-08-18: qty 2

## 2018-08-18 MED ORDER — SODIUM CHLORIDE 0.9% FLUSH: 3.0000 mL | Freq: Two times a day (BID) | INTRAVENOUS | Status: DC

## 2018-08-18 MED ORDER — LIDOCAINE HCL (PF) 1 % IJ SOLN
INTRAMUSCULAR | Status: AC
  Filled 2018-08-18: qty 30

## 2018-08-18 MED ORDER — HEPARIN (PORCINE) IN NACL 1000-0.9 UT/500ML-% IV SOLN
INTRAVENOUS | Status: AC
  Filled 2018-08-18: qty 1500

## 2018-08-18 MED ORDER — SODIUM CHLORIDE 0.9 % IV SOLN: 250.0000 mL | INTRAVENOUS | Status: DC | PRN

## 2018-08-18 MED FILL — METOPROLOL TARTRATE 100 MG: 100 | 90 days supply | Qty: 180 | Fill #0

## 2018-08-18 SURGICAL SUPPLY — 19 items
CATH 5FR JL3.5 JR4 ANG PIG MP (CATHETERS) ×1 IMPLANT
CATH BALLN WEDGE 5F 110CM (CATHETERS) ×1 IMPLANT
CATH INFINITI 5FR JL4 (CATHETERS) ×1 IMPLANT
DEVICE CLOSURE MYNXGRIP 5F (Vascular Products) ×1 IMPLANT
DEVICE RAD COMP TR BAND LRG (VASCULAR PRODUCTS) ×1 IMPLANT
GLIDESHEATH SLEND SS 6F .021 (SHEATH) ×1 IMPLANT
GUIDEWIRE INQWIRE 1.5J.035X260 (WIRE) IMPLANT
INQWIRE 1.5J .035X260CM (WIRE) ×2
KIT HEART LEFT (KITS) ×2 IMPLANT
KIT MICROPUNCTURE NIT STIFF (SHEATH) ×1 IMPLANT
PACK CARDIAC CATHETERIZATION (CUSTOM PROCEDURE TRAY) ×2 IMPLANT
SHEATH GLIDE SLENDER 4/5FR (SHEATH) ×1 IMPLANT
SHEATH PINNACLE 5F 10CM (SHEATH) ×1 IMPLANT
SHEATH PROBE COVER 6X72 (BAG) ×1 IMPLANT
SYR MEDRAD MARK V 150ML (SYRINGE) ×2 IMPLANT
TRANSDUCER W/STOPCOCK (MISCELLANEOUS) ×2 IMPLANT
TUBING CIL FLEX 10 FLL-RA (TUBING) ×2 IMPLANT
WIRE EMERALD 3MM-J .035X150CM (WIRE) ×1 IMPLANT
WIRE HI TORQ VERSACORE-J 145CM (WIRE) ×1 IMPLANT

## 2018-08-18 NOTE — Research (Signed)
[place] Informed Consent           Subject Name:  [name]    Subject met inclusion and exclusion criteria.  The informed consent form, study requirements and expectations were reviewed with the subject and questions and concerns were addressed prior to the signing of the consent form.  The subject verbalized understanding of the trial requirements.  The subject agreed to participate in the [facility] trial and signed the informed consent.  The informed consent was obtained prior to performance of any protocol-specific procedures for the subject.  A copy of the signed informed consent was given to the subject and a copy was placed in the subject's medical record.   [place] Chalmers, Research Assistant 08/18/2018  09:33

## 2018-08-18 NOTE — Progress Notes (Signed)
Patient ambulated in hallway with 2 RN.  No bleeding or hematoma noted before or after ambulation.

## 2018-08-18 NOTE — Brief Op Note (Signed)
BRIEF CARDIAC CATHETERIZATION NOTE  08/18/2018  3:29 PM  PATIENT:  [name]  47 y.o. female  PRE-OPERATIVE DIAGNOSIS:  Shortness of breath and atypical chest pain  POST-OPERATIVE DIAGNOSIS:  Same  PROCEDURE:  Procedure(s): RIGHT/LEFT HEART CATH AND CORONARY ANGIOGRAPHY (N/A)  SURGEON:  Surgeon(s) and Role:    Nelva Bush, MD - Primary  FINDINGS: 1. No angiographically significant CAD. 2. Normal left heart, right heart, and pulmonary artery pressures. 3. Normal LVEF and Fick cardiac output.  RECOMMENDATIONS: 1. Primary prevention of coronary artery disease. 2. Consider evaluation for other causes of symptoms.  Nelva Bush, MD [facility] Pager: [phone]

## 2018-08-18 NOTE — Interval H&P Note (Signed)
History and Physical Interval Note:  08/18/2018 1:53 PM  [name]  has presented today for cardiac catheterization, with the diagnosis of shortness of breath and atypical chest pain. The various methods of treatment have been discussed with the patient and family. After consideration of risks, benefits and other options for treatment, the patient has consented to  Procedure(s): RIGHT/LEFT HEART CATH AND CORONARY ANGIOGRAPHY (N/A) as a surgical intervention .  The patient's history has been reviewed, patient examined, no change in status, stable for surgery.  I have reviewed the patient's chart and labs.  Questions were answered to the patient's satisfaction.    Cath Lab Visit (complete for each Cath Lab visit)  Clinical Evaluation Leading to the Procedure:   ACS: No.  Non-ACS:    Anginal Classification: CCS III  Anti-ischemic medical therapy: Minimal Therapy (1 class of medications)  Non-Invasive Test Results: No non-invasive testing performed  Prior CABG: No previous CABG   Braya Habermehl

## 2018-08-18 NOTE — Discharge Instructions (Signed)
Femoral Site Care °Refer to this sheet in the next few weeks. These instructions provide you with information about caring for yourself after your procedure. Your health care provider may also give you more specific instructions. Your treatment has been planned according to current medical practices, but problems sometimes occur. Call your health care provider if you have any problems or questions after your procedure. °What can I expect after the procedure? °After your procedure, it is typical to have the following: °· Bruising at the site that usually fades within 1-2 weeks. °· Blood collecting in the tissue (hematoma) that may be painful to the touch. It should usually decrease in size and tenderness within 1-2 weeks. ° °Follow these instructions at home: °· Take medicines only as directed by your health care provider. °· You may shower 24-48 hours after the procedure or as directed by your health care provider. Remove the bandage (dressing) and gently wash the site with plain soap and water. Pat the area dry with a clean towel. Do not rub the site, because this may cause bleeding. °· Do not take baths, swim, or use a hot tub until your health care provider approves. °· Check your insertion site every day for redness, swelling, or drainage. °· Do not apply powder or lotion to the site. °· Limit use of stairs to twice a day for the first 2-3 days or as directed by your health care provider. °· Do not squat for the first 2-3 days or as directed by your health care provider. °· Do not lift over 10 lb (4.5 kg) for 5 days after your procedure or as directed by your health care provider. °· Ask your health care provider when it is okay to: °? Return to work or school. °? Resume usual physical activities or sports. °? Resume sexual activity. °· Do not drive home if you are discharged the same day as the procedure. Have someone else drive you. °· You may drive 24 hours after the procedure unless otherwise instructed by  your health care provider. °· Do not operate machinery or power tools for 24 hours after the procedure or as directed by your health care provider. °· If your procedure was done as an outpatient procedure, which means that you went home the same day as your procedure, a responsible adult should be with you for the first 24 hours after you arrive home. °· Keep all follow-up visits as directed by your health care provider. This is important. °Contact a health care provider if: °· You have a fever. °· You have chills. °· You have increased bleeding from the site. Hold pressure on the site. °Get help right away if: °· You have unusual pain at the site. °· You have redness, warmth, or swelling at the site. °· You have drainage (other than a small amount of blood on the dressing) from the site. °· The site is bleeding, and the bleeding does not stop after 30 minutes of holding steady pressure on the site. °· Your leg or foot becomes pale, cool, tingly, or numb. °This information is not intended to replace advice given to you by your health care provider. Make sure you discuss any questions you have with your health care provider. °Document Released: 07/06/2014 Document Revised: 04/09/2016 Document Reviewed: 05/22/2014 °Elsevier Interactive Patient Education © 2018 [employer] ° °Radial Site Care °Refer to this sheet in the next few weeks. These instructions provide you with information about caring for yourself after your procedure. Your health   care provider may also give you more specific instructions. Your treatment has been planned according to current medical practices, but problems sometimes occur. Call your health care provider if you have any problems or questions after your procedure. °What can I expect after the procedure? °After your procedure, it is typical to have the following: °· Bruising at the radial site that usually fades within 1-2 weeks. °· Blood collecting in the tissue (hematoma) that may be  painful to the touch. It should usually decrease in size and tenderness within 1-2 weeks. ° °Follow these instructions at home: °· Take medicines only as directed by your health care provider. °· You may shower 24-48 hours after the procedure or as directed by your health care provider. Remove the bandage (dressing) and gently wash the site with plain soap and water. Pat the area dry with a clean towel. Do not rub the site, because this may cause bleeding. °· Do not take baths, swim, or use a hot tub until your health care provider approves. °· Check your insertion site every day for redness, swelling, or drainage. °· Do not apply powder or lotion to the site. °· Do not flex or bend the affected arm for 24 hours or as directed by your health care provider. °· Do not push or pull heavy objects with the affected arm for 24 hours or as directed by your health care provider. °· Do not lift over 10 lb (4.5 kg) for 5 days after your procedure or as directed by your health care provider. °· Ask your health care provider when it is okay to: °? Return to work or school. °? Resume usual physical activities or sports. °? Resume sexual activity. °· Do not drive home if you are discharged the same day as the procedure. Have someone else drive you. °· You may drive 24 hours after the procedure unless otherwise instructed by your health care provider. °· Do not operate machinery or power tools for 24 hours after the procedure. °· If your procedure was done as an outpatient procedure, which means that you went home the same day as your procedure, a responsible adult should be with you for the first 24 hours after you arrive home. °· Keep all follow-up visits as directed by your health care provider. This is important. °Contact a health care provider if: °· You have a fever. °· You have chills. °· You have increased bleeding from the radial site. Hold pressure on the site. °Get help right away if: °· You have unusual pain at the  radial site. °· You have redness, warmth, or swelling at the radial site. °· You have drainage (other than a small amount of blood on the dressing) from the radial site. °· The radial site is bleeding, and the bleeding does not stop after 30 minutes of holding steady pressure on the site. °· Your arm or hand becomes pale, cool, tingly, or numb. °This information is not intended to replace advice given to you by your health care provider. Make sure you discuss any questions you have with your health care provider. °Document Released: 12/05/2010 Document Revised: 04/09/2016 Document Reviewed: 05/21/2014 °Elsevier Interactive Patient Education © 2018 [employer] ° ° °

## 2018-08-19 ENCOUNTER — Encounter (HOSPITAL_COMMUNITY): Payer: Self-pay | Admitting: Internal Medicine

## 2018-09-02 ENCOUNTER — Ambulatory Visit: Payer: 59 | Admitting: Physician Assistant

## 2018-09-02 ENCOUNTER — Encounter: Payer: Self-pay | Admitting: Physician Assistant

## 2018-09-02 VITALS — BP 102/70 | HR 72 | Ht 62.0 in | Wt 233.0 lb

## 2018-09-02 DIAGNOSIS — I10 Essential (primary) hypertension: Secondary | ICD-10-CM

## 2018-09-02 DIAGNOSIS — R000 Tachycardia, unspecified: Secondary | ICD-10-CM

## 2018-09-02 DIAGNOSIS — R0602 Shortness of breath: Secondary | ICD-10-CM | POA: Diagnosis not present

## 2018-09-02 DIAGNOSIS — I5032 Chronic diastolic (congestive) heart failure: Secondary | ICD-10-CM

## 2018-09-02 NOTE — Patient Instructions (Signed)
Medication Instructions:  Your physician recommends that you continue on your current medications as directed. Please refer to the Current Medication list given to you today.  If you need a refill on your cardiac medications before your next appointment, please call your pharmacy.   Lab work: NONE  ORDERED TODAY If you have labs (blood work) drawn today and your tests are completely normal, you will receive your results only by: Marland Kitchen MyChart Message (if you have MyChart) OR . A paper copy in the mail If you have any lab test that is abnormal or we need to change your treatment, we will call you to review the results.  Testing/Procedures: NONE ORDERED TODAY  Follow-Up: At [facility], you and your health needs are our priority.  As part of our continuing mission to provide you with exceptional heart care, we have created designated Provider Care Teams.  These Care Teams include your primary Cardiologist (physician) and Advanced Practice Providers (APPs -  Physician Assistants and Nurse Practitioners) who all work together to provide you with the care you need, when you need it. You will need a follow up appointment in:  7 months.  Please call our office 2 months in advance to schedule this appointment.  You may see Mertie Moores, MD or one of the following Advanced Practice Providers on your designated Care Team: Richardson Dopp, PA-C [place], [place] . Daune Perch, NP  Any Other Special Instructions Will Be Listed Below (If Applicable).

## 2018-09-02 NOTE — Progress Notes (Signed)
Cardiology Office Note:    Date:  09/02/2018   ID:  [name], DOB Nov 17, 1970, MRN [id number]  PCP:  Orlena Sheldon, PA-C  Cardiologist:  Mertie Moores, MD   Electrophysiologist:  None   Referring MD: Orlena Sheldon, PA-C   Chief Complaint  Patient presents with  . Hospitalization Follow-up    s/p cardiac cath     History of Present Illness:    [name] is a 47 y.o. female with diastolic congestive heart failure, sinus tachycardia felt to be related to ADHD treatment, hypertension, obstructive sleep apnea, ADHD.    She was recently seen for evaluation of chest pain.  D-dimer was negative.  Cardiac catheterization was arranged.  This demonstrated normal coronary arteries and normal left and right heart pressures as well as normal pulmonary pressures.  Her beta-blocker was increased further for treatment of sinus tachycardia and uncontrolled hypertension.     [name] returns for follow up.  She is here alone.  She continues to have dyspnea on exertion and some chest pain.  She also notes some wheezing as well as indigestion.  She was put on a proventil inhaler in the past for asthma.  She does sleep with a CPAP for OSA.  Her palpitations are improved.    Prior CV studies:   The following studies were reviewed today:  R/L cardiac catheterization 08/18/2018 LM normal LAD normal LCx normal RCA normal EF >65, trivial (1+) mitral regurgitation RA (mean): 6 mmHg RV (S/EDP): 31/6 mmHg PA (S/D, mean): 31/10 (17) mmHg PCWP (mean): 10 mmHg Ao sat: 96% PA sat: 77% Fick CO: 7.8 L/min Fick CI: 3.8 L/min/m^2  Conclusions: 1. No angiographically significant coronary artery disease. 2. Normal left heart, right heart, and pulmonary artery pressures. 3. Normal LVEF and Fick cardiac output. 4. 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  Consider alternate access for future  procedures.  Recommendations. 1. Primary prevention of coronary artery disease. 2. Increase metoprolol tartrate to 100 mg twice daily given sinus tachycardia and hypertension at baseline. 3. Consider evaluation for noncardiac causes of symptoms   Echo 10/25/2015 EF 60-65, normal wall motion, grade 1 diastolic dysfunction, GLS -19.1%  Holter 10/2015 NSR  Past Medical History:  Diagnosis Date  . Anxiety   . Asthma   . Echocardiogram 10/25/2015   EF 60-65, normal wall motion, grade 1 diastolic dysfunction, GLS -19.1%  . Fracture dislocation of elbow joint 01/15/2013   right  . GERD (gastroesophageal reflux disease)    Omeprazole as needed, none in 6 mos.  . Obesity   . R/L Cardiac catheterization 08/18/2018   Normal coronary arteries, mean RA 6, mean PA 17, PCWP 10  . Sleep apnea    uses CPAP "most nights"   Surgical Hx: The patient  has a past surgical history that includes Knee arthroscopy (Left, 1991); Laparoscopic assisted vaginal hysterectomy (11/02/2006); Tubal ligation (03/06/2005); Laparoscopic lysis of adhesions (03/06/2005); Cesarean section (1998; 10/10/2000); Radial head arthroplasty (Right, 01/17/2013); and RIGHT/LEFT HEART CATH AND CORONARY ANGIOGRAPHY (N/A, 08/18/2018).   Current Medications: Current Meds  Medication Sig  . albuterol (PROVENTIL HFA;VENTOLIN HFA) 108 (90 Base) MCG/ACT inhaler Inhale 2 puffs into the lungs every 6 (six) hours as needed for wheezing.  Marland Kitchen ALPRAZolam (XANAX) 0.25 MG tablet Take 1 tablet (0.25 mg total) by mouth daily as needed for anxiety.  Marland Kitchen amphetamine-dextroamphetamine (ADDERALL) 20 MG tablet Take 20 mg by mouth daily.  . furosemide (LASIX) 20 MG  tablet Take 2 tablets (40 mg total) by mouth daily as needed (swelling).  Marland Kitchen ibuprofen (ADVIL,MOTRIN) 200 MG tablet Take 400 mg by mouth 2 (two) times daily as needed for headache or moderate pain.  . metoprolol tartrate (LOPRESSOR) 100 MG tablet Take 1 tablet (100 mg total) by mouth 2 (two) times  daily.  . nitroGLYCERIN (NITROSTAT) 0.4 MG SL tablet Place 1 tablet (0.4 mg total) under the tongue every 5 (five) minutes as needed for chest pain.     Allergies:   Adhesive [tape] and Latex   Social History   Tobacco Use  . Smoking status: Never Smoker  . Smokeless tobacco: Never Used  Substance Use Topics  . Alcohol use: No  . Drug use: No     Family Hx: The patient's family history includes Diabetes in her maternal grandmother, mother, paternal grandmother, and sister; Heart disease in her maternal grandfather, maternal grandmother, and maternal uncle; Heart disease (age of onset: 62) in her father; Hypertension in her mother; Liver disease in her paternal aunt; Ovarian cancer in her maternal grandmother.  ROS:   Please see the history of present illness.    Review of Systems  Cardiovascular: Positive for dyspnea on exertion and irregular heartbeat.  Musculoskeletal: Positive for joint pain.   All other systems reviewed and are negative.   EKGs/Labs/Other Test Reviewed:    EKG:  EKG is  ordered today.  The ekg ordered today demonstrates normal sinus rhythm, HR 72, normal axis, QTc 448, similar to old EKG  Recent Labs: 12/24/2017: ALT 11; TSH 3.21 08/15/2018: BUN 10; Creatinine, Ser 0.55; Hemoglobin 13.5; Platelets 314; Potassium 3.7; Sodium 138   Recent Lipid Panel Lab Results  Component Value Date/Time   CHOL 208 (H) 12/24/2017 08:18 AM   TRIG 126 12/24/2017 08:18 AM   HDL 50 (L) 12/24/2017 08:18 AM   CHOLHDL 4.2 12/24/2017 08:18 AM   LDLCALC 134 (H) 12/24/2017 08:18 AM    Physical Exam:    VS:  BP 102/70   Pulse 72   Ht 5\' 2"  (1.575 m)   Wt 233 lb (105.7 kg)   SpO2 95%   BMI 42.62 kg/m     Wt Readings from Last 3 Encounters:  09/02/18 233 lb (105.7 kg)  08/18/18 236 lb (107 kg)  08/15/18 236 lb (107 kg)     Physical Exam  Constitutional: She is oriented to person, place, and time. She appears well-developed and well-nourished. No distress.  HENT:   Head: Normocephalic and atraumatic.  Eyes: No scleral icterus.  Neck: No JVD present. No thyromegaly present.  Cardiovascular: Normal rate, regular rhythm and normal heart sounds.  No murmur heard. Pulmonary/Chest: Effort normal. She has no wheezes. She has no rales.  Abdominal: Soft. She exhibits no distension.  Musculoskeletal: She exhibits edema (trace L ankle edema).  R groin without pulsatile mass or bruit  Lymphadenopathy:    She has no cervical adenopathy.  Neurological: She is alert and oriented to person, place, and time.  Skin: Skin is warm and dry.  Psychiatric: She has a normal mood and affect.    ASSESSMENT & PLAN:    Shortness of breath  Recent Cardiac Catheterization with no CAD.  DDimer was neg.  Her R and L pressures were normal during her catheterization.  She does have a hx of asthma.  She also had to stop exercising several weeks ago and she has noted some indigestion.  I suspect her dyspnea is multifactorial and related to deconditioning and  reactive airway disease possibly worsened by acid reflux.  I have asked her to follow up with her primary care provider for further management of asthma.  She can take an antacid for now.  She can also increase her activity now that her heart cath shows no CAD.    Chronic diastolic CHF (congestive heart failure) (HCC)  Volume status is normal.  She takes as needed Lasix.  Her filling pressures were normal at Cardiac Catheterization.  We discussed the importance of daily weights and when to take Lasix.   Essential hypertension  The patient's blood pressure is controlled on her current regimen.  Continue current therapy.    Sinus tachycardia  Improved on current dose of Metoprolol.   Dispo:  Return in about 7 months (around 04/03/2019) for Routine Follow Up, w/ Dr. Acie Fredrickson.   Medication Adjustments/Labs and Tests Ordered: Current medicines are reviewed at length with the patient today.  Concerns regarding medicines are  outlined above.  Tests Ordered: Orders Placed This Encounter  Procedures  . EKG 12-Lead   Medication Changes: No orders of the defined types were placed in this encounter.   Signed, Richardson Dopp, PA-C  09/02/2018 12:24 PM    [facility] [address], [place], [place]  [address] Phone: [phone]; Fax: [phone]

## 2018-10-17 ENCOUNTER — Other Ambulatory Visit: Payer: Self-pay

## 2018-10-18 NOTE — Telephone Encounter (Signed)
[place] patient.  

## 2018-10-19 MED ORDER — FUROSEMIDE 20 MG PO TABS: 40.0000 mg | ORAL_TABLET | Freq: Every day | ORAL | Status: DC | PRN

## 2018-11-01 ENCOUNTER — Other Ambulatory Visit: Payer: Self-pay | Admitting: Family Medicine

## 2018-11-01 MED ORDER — FUROSEMIDE 20 MG PO TABS: 40.0000 mg | ORAL_TABLET | Freq: Every day | ORAL | 2 refills | Status: DC | PRN

## 2018-11-01 MED FILL — FUROSEMIDE 20 MG TABS: 20 | 15 days supply | Qty: 30 | Fill #0

## 2018-11-01 NOTE — Telephone Encounter (Signed)
Medication called/sent to requested pharmacy  

## 2018-11-17 ENCOUNTER — Other Ambulatory Visit: Payer: Self-pay | Admitting: *Deleted

## 2018-11-17 MED ORDER — AMPHETAMINE-DEXTROAMPHETAMINE 20 MG PO TABS: 20.0000 mg | ORAL_TABLET | Freq: Every day | ORAL | 0 refills | Status: DC

## 2018-11-17 MED FILL — DEXTROAMP-AMPHETAMIN 20 MG: 20 | 30 days supply | Qty: 30 | Fill #0

## 2018-11-17 NOTE — Telephone Encounter (Signed)
Received call from patient.   Requested refill on  Adderall.   Ok to refill??  Last office visit 12/29/2017.  Last refill 09/02/2018.  Of note, patient is aware that she will need to establish with another provider. Will call back to schedule appointment.

## 2018-11-21 ENCOUNTER — Encounter (INDEPENDENT_AMBULATORY_CARE_PROVIDER_SITE_OTHER): Payer: Self-pay | Admitting: Family Medicine

## 2018-11-21 NOTE — Telephone Encounter (Signed)
Please address. Thanks!

## 2018-11-22 IMAGING — MR MR KNEE*L* W/O CM
4 of 6 series · 19 of 40 positions shown · non-contrast
Comparison: Left knee x-rays dated December 15, 2017.

CLINICAL DATA: Acute onset left medial and posterior knee pain for
the past week after walking up a lot of stairs. History of prior
arthroscopic surgery.

EXAM:
MRI OF THE LEFT KNEE WITHOUT CONTRAST
TECHNIQUE: Multiplanar, multisequence MR imaging of the knee was performed. No
intravenous contrast was administered.

[Series 3: PD fat-sat · axial · 4.0mm · 0.31mm/px · z∈[-42,+64]mm · 8 of 23 slices shown (1 of 4)]
[im 1/23]
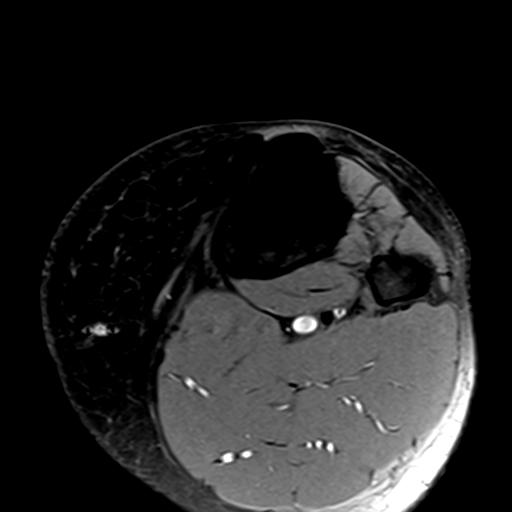
[im 4/23]
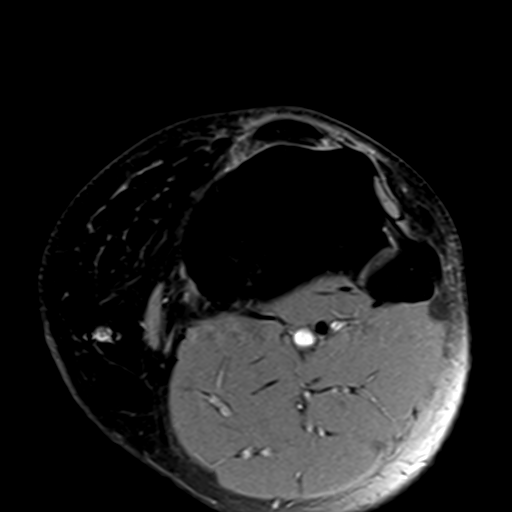
[im 7/23]
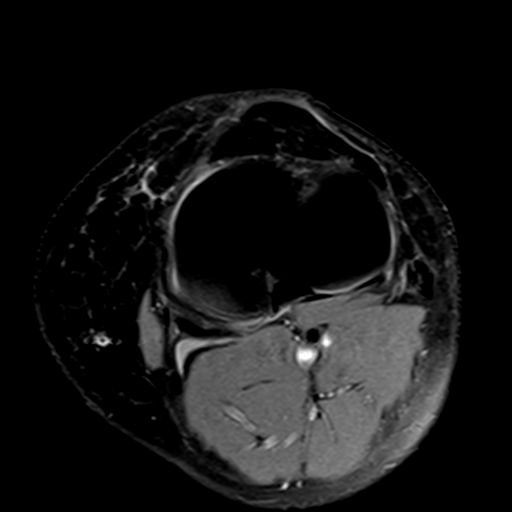
[im 10/23]
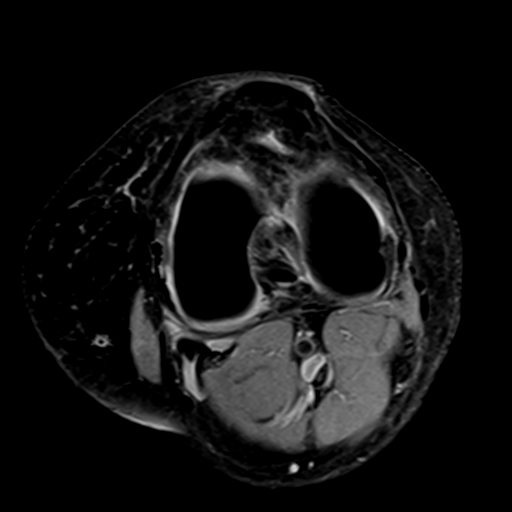
[im 13/23]
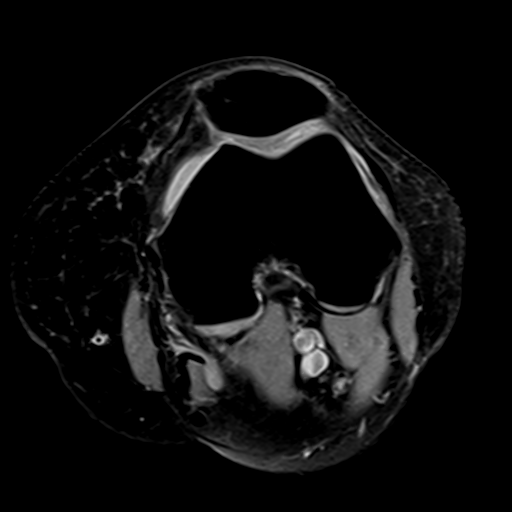
[im 16/23]
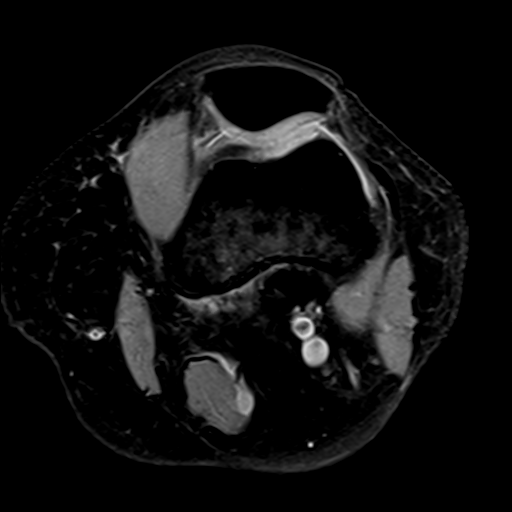
[im 19/23]
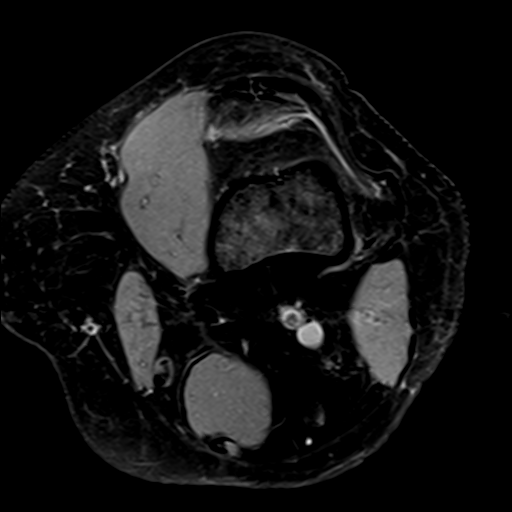
[im 23/23]
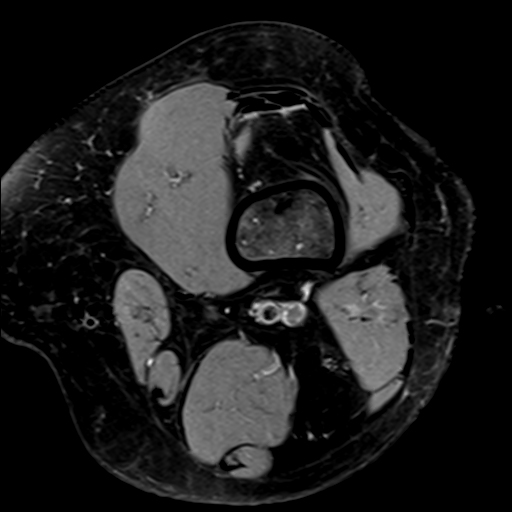

[Series 4: PD fat-sat · coronal · 3.5mm · 0.29mm/px · 5 of 24 slices shown (2 of 4)]
[im 1/24]
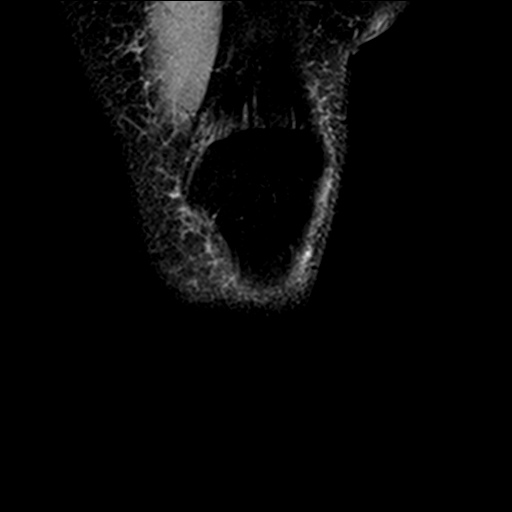
[im 4/24]
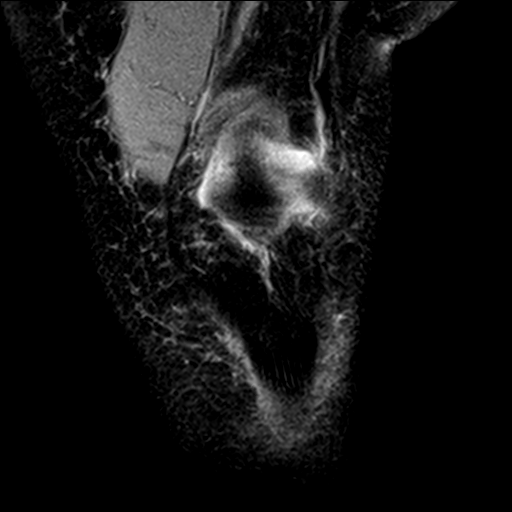
[im 8/24]
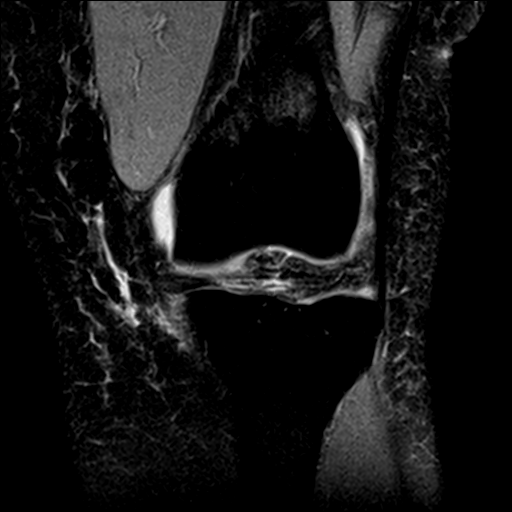
[im 12/24]
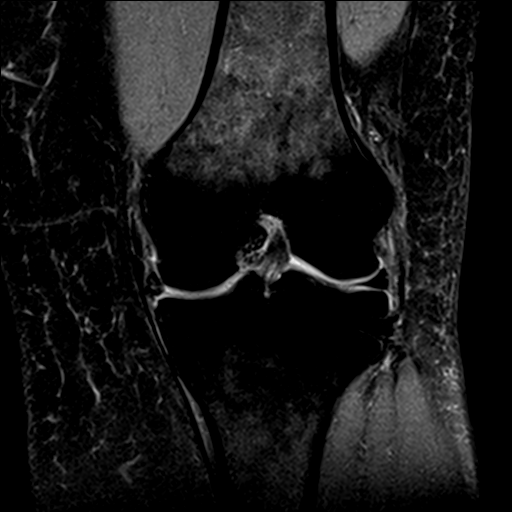
[im 20/24]
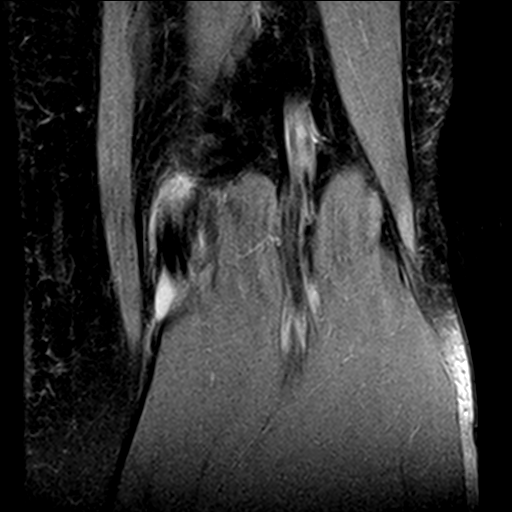

[Series 6: PD fat-sat · sagittal · 3.2mm · 0.29mm/px · 3 of 24 slices shown (3 of 4)]
[im 4/24]
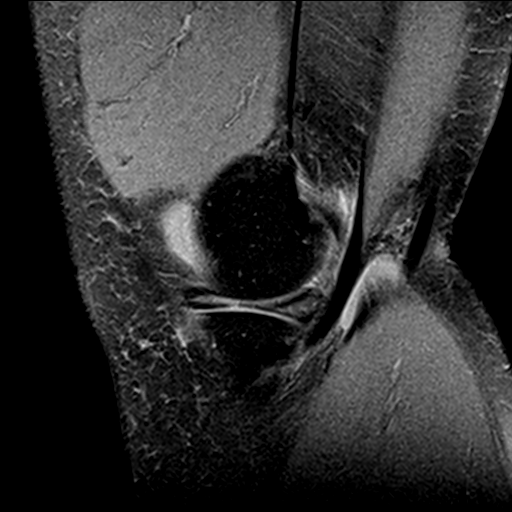
[im 12/24]
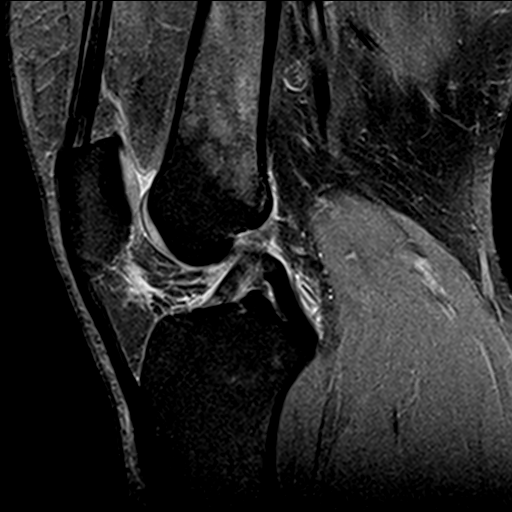
[im 20/24]
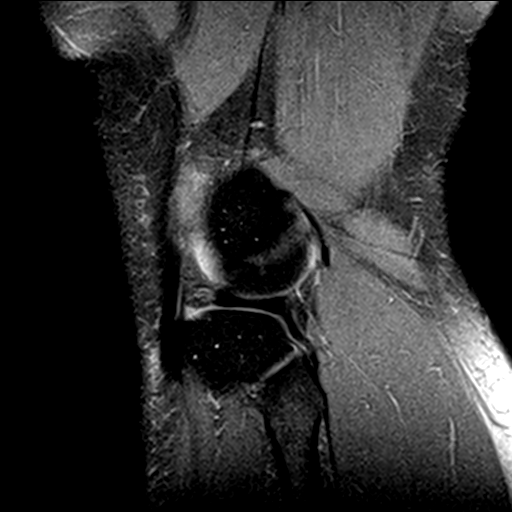

[Series 8: PD fat-sat · coronal · 2.0mm · 0.29mm/px · 3 of 15 slices shown (4 of 4)]
[im 1/15]
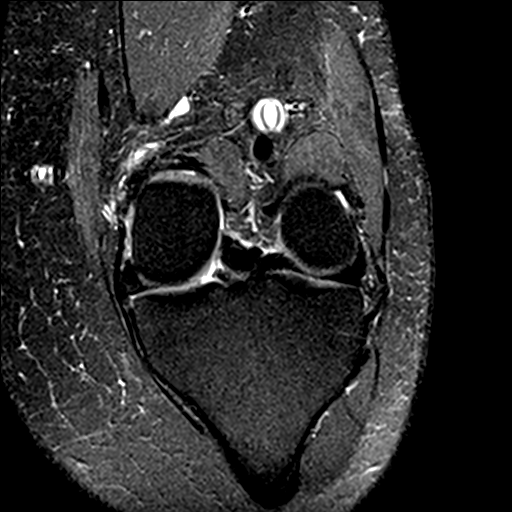
[im 10/15]
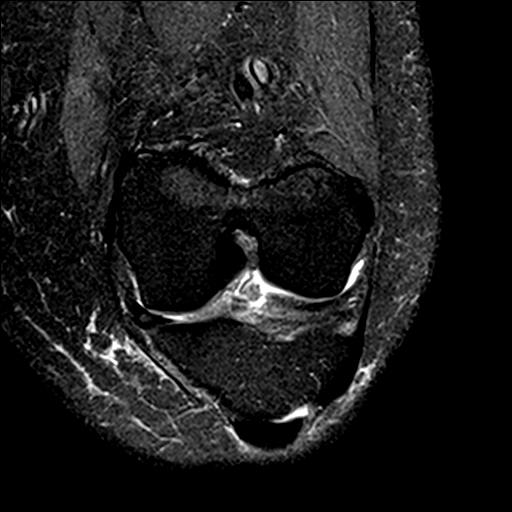
[im 15/15]
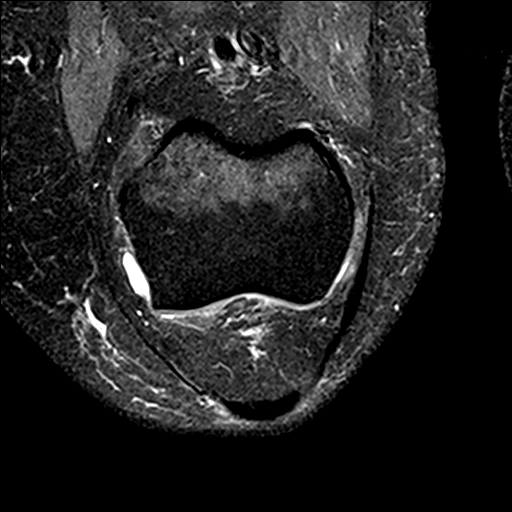

[19 of 40 positions shown; findings below may reference images not displayed]

FINDINGS: MENISCI

Medial meniscus: Near complete radial tear of the posterior horn
near the posterior root. Additional blunting of the free edge of the
body. Horizontal tear of the anterior body/horn junction.

Lateral meniscus:  Intact.

LIGAMENTS

Cruciates:  Intact ACL and PCL.

Collaterals: Medial collateral ligament is intact. Lateral
collateral ligament complex is intact.

CARTILAGE

Patellofemoral:  No chondral defect.

Medial: Mild superficial irregularity over the central
weight-bearing medial femoral condyle and medial tibial plateau. No
focal defect.

Lateral:  No chondral defect.

Joint:  No joint effusion.  No plical thickening.

Popliteal Fossa:  Small Baker cyst.  Intact popliteus tendon.

Extensor Mechanism:  Intact quadriceps tendon and patellar tendon.

Bones: No focal marrow signal abnormality. No fracture or
dislocation.

Other: None.
IMPRESSION: 1. Near complete radial tear of the medial meniscus posterior horn
near the posterior root.
2. Additional horizontal tear of the medial meniscus anterior
body/horn junction.
3. Blunting of the medial meniscus body free edge could reflect
additional radial tear in the absence of prior partial meniscectomy.
4. Small Baker cyst.

## 2018-11-24 ENCOUNTER — Encounter (INDEPENDENT_AMBULATORY_CARE_PROVIDER_SITE_OTHER): Payer: 59

## 2018-11-30 ENCOUNTER — Encounter (INDEPENDENT_AMBULATORY_CARE_PROVIDER_SITE_OTHER): Payer: Self-pay | Admitting: Family Medicine

## 2018-11-30 ENCOUNTER — Ambulatory Visit (INDEPENDENT_AMBULATORY_CARE_PROVIDER_SITE_OTHER): Payer: 59 | Admitting: Family Medicine

## 2018-11-30 VITALS — BP 143/84 | HR 71 | Temp 98.0°F | Ht 63.0 in | Wt 235.0 lb

## 2018-11-30 DIAGNOSIS — R0602 Shortness of breath: Secondary | ICD-10-CM | POA: Diagnosis not present

## 2018-11-30 DIAGNOSIS — I5189 Other ill-defined heart diseases: Secondary | ICD-10-CM | POA: Diagnosis not present

## 2018-11-30 DIAGNOSIS — Z1331 Encounter for screening for depression: Secondary | ICD-10-CM | POA: Diagnosis not present

## 2018-11-30 DIAGNOSIS — R739 Hyperglycemia, unspecified: Secondary | ICD-10-CM | POA: Diagnosis not present

## 2018-11-30 DIAGNOSIS — I10 Essential (primary) hypertension: Secondary | ICD-10-CM | POA: Diagnosis not present

## 2018-11-30 DIAGNOSIS — Z9189 Other specified personal risk factors, not elsewhere classified: Secondary | ICD-10-CM

## 2018-11-30 DIAGNOSIS — R5383 Other fatigue: Secondary | ICD-10-CM | POA: Diagnosis not present

## 2018-11-30 DIAGNOSIS — Z6841 Body Mass Index (BMI) 40.0 and over, adult: Secondary | ICD-10-CM | POA: Diagnosis not present

## 2018-11-30 DIAGNOSIS — Z0289 Encounter for other administrative examinations: Secondary | ICD-10-CM

## 2018-11-30 NOTE — Progress Notes (Signed)
Office: [phone]  /  Fax: [phone]   Dear Stanton Kidney B. Dixon, PA-C,   Thank you for referring [name] to our clinic. The following note includes my evaluation and treatment recommendations.  HPI:   Chief Complaint: OBESITY    [name] has been referred by Stanton Kidney B. Dixon, PA-C for consultation regarding her obesity and obesity related comorbidities.    [name] (MR# [id number]) is a 48 y.o. female who presents on 11/30/2018 for obesity evaluation and treatment. Current BMI is Body mass index is 41.63 kg/m.  [name] has been struggling with her weight for many years and has been unsuccessful in either losing weight, maintaining weight loss, or reaching her healthy weight goal.     [name] attended our information session and states she is currently in the action stage of change and ready to dedicate time achieving and maintaining a healthier weight. [name] is interested in becoming our patient and working on intensive lifestyle modifications including (but not limited to) diet, exercise and weight loss.    [name] states her family eats meals together sometimes she thinks her family may eat healthier with her she struggles with family and or coworkers weight loss sabotage her desired weight loss is 60 lbs she started gaining weight after childbirth her heaviest weight ever was 240 lbs. she is a picky eater and doesn't like to eat healthier foods  she has significant food cravings issues  she skips meals frequently she is frequently drinking liquids with calories she frequently makes poor food choices she frequently eats larger portions than normal sometimes she has binge eating behaviors she struggles with emotional eating    Fatigue [name] feels her energy is lower than it should be. This has worsened with weight gain and has not worsened recently. [name] admits to daytime somnolence and admits to waking up still tired. Patient is at risk for  obstructive sleep apnea. Patent has a history of symptoms of daytime fatigue, morning headache, and hypertension. Patient generally gets 5 or 7 hours of sleep per night, and states they generally have generally restful sleep. Snoring is present. Apneic episodes are not present. Epworth Sleepiness Score is 14.  Dyspnea on exertion [name] notes increasing shortness of breath with exercising and seems to be worsening over time with weight gain. She notes getting out of breath sooner with activity than she used to. This has not gotten worse recently. [name] denies orthopnea.  Hypertension [name] is a 48 y.o. female with hypertension. [name]'s blood pressure is elevated today, which may be white coat syndrome. She is on metoprolol 100mg , Adderall 20mg , and Lasix 20mg  as needed. She is working on weight loss to help control her blood pressure with the goal of decreasing her risk of heart attack and stroke.    Diastolic Dysfunction [name] was diagnosed by echocardiogram in 2016 with Stage 1 diastolic dysfunction. She is on Lasix 40mg  as needed per her cardiologist.  At risk for cardiovascular disease [name] is at a higher than average risk for cardiovascular disease due to hypertension and obesity. She currently denies any chest pain.  Hyperglycemia [name] has a history of some elevated blood glucose readings without a diagnosis of diabetes. She is not on metformin and is attempting to improve with diet. She notes feeling hypoglycemic occasionally and denies polyphagia.  Depression Screen [name]'s Food and Mood (modified PHQ-9) score was 14. Depression screen PHQ 2/9 11/30/2018  Decreased Interest 3  Down, Depressed, Hopeless 2  PHQ - 2  Score 5  Altered sleeping 0  Tired, decreased energy 2  Change in appetite 0  Feeling bad or failure about yourself  2  Trouble concentrating 2  Moving slowly or fidgety/restless 3  Suicidal thoughts 0  PHQ-9 Score 14  Difficult doing  work/chores Somewhat difficult    ASSESSMENT AND PLAN:  Other fatigue - Plan: EKG 12-Lead, Folate, Vitamin B12, VITAMIN D 25 Hydroxy (Vit-D Deficiency, Fractures), TSH, T4, free, T3  Shortness of breath on exertion  Essential hypertension - Plan: CBC with Differential/Platelet, Comprehensive metabolic panel, Lipid panel  Diastolic dysfunction  Hyperglycemia - Plan: Hemoglobin A1c, Insulin, random  At risk for heart disease  Screening for depression  Class 3 severe obesity with serious comorbidity and body mass index (BMI) of 40.0 to 44.9 in adult, unspecified obesity type (HCC)  PLAN:  Fatigue [name] was informed that her fatigue may be related to obesity, depression or many other causes. Labs will be ordered, and in the meanwhile [name] has agreed to work on diet, exercise and weight loss to help with fatigue. Proper sleep hygiene was discussed including the need for 7-8 hours of quality sleep each night. A sleep study was not ordered based on symptoms and Epworth score. An EKG and an indirect calorimetry was ordered today. [name] agrees to follow up in 2 weeks.  Dyspnea on exertion [name]'s shortness of breath appears to be obesity related and exercise induced. She has agreed to work on weight loss and gradually increase exercise to treat her exercise induced shortness of breath. If [name] follows our instructions and loses weight without improvement of her shortness of breath, we will plan to refer to pulmonology. Labs, an indirect calorimetry, and an EKG were ordered today. We will monitor this condition regularly. [name] agrees to this plan.  Hypertension We discussed sodium restriction, working on healthy weight loss, and a regular exercise program as the means to achieve improved blood pressure control. We will continue to monitor her blood pressure as well as her progress with the above lifestyle modifications. She will continue her medications as prescribed and will watch  for signs of hypotension as she continues her lifestyle modifications. She will continue her medications and start her diet prescription. We will recheck her blood pressure in 2 weeks. [name] agreed with this plan and agreed to follow up as directed.   Diastolic Dysfunction Shadara was educated on what is diastolic dysfunction, causes, and treatment including tight blood pressure control and weight loss.  Cardiovascular risk counseling Elma was given extended (30 minutes) coronary artery disease prevention counseling today. She is 48 y.o. female and has risk factors for heart disease including hypertension and obesity. We discussed intensive lifestyle modifications today with an emphasis on specific weight loss instructions and strategies. Pt was also informed of the importance of increasing exercise and decreasing saturated fats to help prevent heart disease.  Hyperglycemia Fasting labs will be obtained and results with be discussed with Lorre in 2 weeks at her follow up visit. In the meanwhile Miko was started on her diet prescription and she will work on weight loss efforts.  Depression Screen Georgetta had a moderately positive depression screening. Depression is commonly associated with obesity and often results in emotional eating behaviors. We will monitor this closely and work on CBT to help improve the non-hunger eating patterns. Referral to Psychology may be required if no improvement is seen as she continues in our clinic.  Obesity Aarionna is currently in the action stage of change and  her goal is to continue with weight loss efforts. I recommend Paytience begin the structured treatment plan as follows:  She has agreed to follow the Category 3 plan. Jaselle has been instructed to eventually work up to a goal of 150 minutes of combined cardio and strengthening exercise per week for weight loss and overall health benefits. We discussed the following Behavioral Modification Strategies  today: increasing lean protein intake, decreasing simple carbohydrates, and work on meal planning and easy cooking plans.   She was informed of the importance of frequent follow up visits to maximize her success with intensive lifestyle modifications for her multiple health conditions. She was informed we would discuss her lab results at her next visit unless there is a critical issue that needs to be addressed sooner. Karalee agreed to keep her next visit at the agreed upon time to discuss these results.  ALLERGIES: Allergies  Allergen Reactions  . Adhesive [Tape] Rash  . Latex Rash    MEDICATIONS: Current Outpatient Medications on File Prior to Visit  Medication Sig Dispense Refill  . albuterol (PROVENTIL HFA;VENTOLIN HFA) 108 (90 Base) MCG/ACT inhaler Inhale 2 puffs into the lungs every 6 (six) hours as needed for wheezing. 1 Inhaler 11  . ALPRAZolam (XANAX) 0.25 MG tablet Take 1 tablet (0.25 mg total) by mouth daily as needed for anxiety. 30 tablet 1  . amphetamine-dextroamphetamine (ADDERALL) 20 MG tablet Take 1 tablet (20 mg total) by mouth daily. 30 tablet 0  . furosemide (LASIX) 20 MG tablet Take 2 tablets (40 mg total) by mouth daily as needed (swelling). 30 tablet 2  . ibuprofen (ADVIL,MOTRIN) 200 MG tablet Take 400 mg by mouth 2 (two) times daily as needed for headache or moderate pain.    . metoprolol tartrate (LOPRESSOR) 100 MG tablet Take 1 tablet (100 mg total) by mouth 2 (two) times daily. 60 tablet 11  . nitroGLYCERIN (NITROSTAT) 0.4 MG SL tablet Place 1 tablet (0.4 mg total) under the tongue every 5 (five) minutes as needed for chest pain. 25 tablet 3   No current facility-administered medications on file prior to visit.     PAST MEDICAL HISTORY: Past Medical History:  Diagnosis Date  . Anxiety   . Asthma   . Back pain   . Chest pain   . Chronic CHF ([place])   . Dyspnea   . Echocardiogram 10/25/2015   EF 60-65, normal wall motion, grade 1 diastolic dysfunction, GLS  -19.1%  . Edema of left lower extremity   . Fracture dislocation of elbow joint 01/15/2013   right  . GERD (gastroesophageal reflux disease)    Omeprazole as needed, none in 6 mos.  . Hyperlipidemia   . Hypertension   . Joint pain   . Obesity   . Palpitations   . Prediabetes   . R/L Cardiac catheterization 08/18/2018   Normal coronary arteries, mean RA 6, mean PA 17, PCWP 10  . Seborrheic dermatitis   . Sleep apnea    uses CPAP "most nights"    PAST SURGICAL HISTORY: Past Surgical History:  Procedure Laterality Date  . [place]; 10/10/2000   x 2  . KNEE ARTHROSCOPY Left 1991   also in 2019  . LAPAROSCOPIC ASSISTED VAGINAL HYSTERECTOMY  11/02/2006  . LAPAROSCOPIC LYSIS OF ADHESIONS  03/06/2005  . RADIAL HEAD ARTHROPLASTY Right 01/17/2013   Procedure: RIGHT RADIAL HEAD ARTHROPLASTY OPEN REDUCTION INTERNAL FIXATION CORONOID ;  Surgeon: Tennis Must, MD;  Location: [facility];  Service: Orthopedics;  Laterality: Right;  . RIGHT/LEFT HEART CATH AND CORONARY ANGIOGRAPHY N/A 08/18/2018   Procedure: RIGHT/LEFT HEART CATH AND CORONARY ANGIOGRAPHY;  Surgeon: Nelva Bush, MD;  Location: [facility];  Service: Cardiovascular;  Laterality: N/A;  . TUBAL LIGATION  03/06/2005    SOCIAL HISTORY: Social History   Tobacco Use  . Smoking status: Never Smoker  . Smokeless tobacco: Never Used  Substance Use Topics  . Alcohol use: No  . Drug use: No    FAMILY HISTORY: Family History  Problem Relation Age of Onset  . Diabetes Sister   . Heart disease Father 3       MI--1st Dx CAD. Died2that time  . Sudden death Father   . Diabetes Mother   . Hypertension Mother   . Heart disease Mother   . Obesity Mother   . Heart disease Maternal Uncle   . Ovarian cancer Maternal Grandmother   . Diabetes Maternal Grandmother   . Heart disease Maternal Grandmother        Event organiser  . Diabetes Paternal Grandmother   . Liver disease Paternal Aunt   .  Heart disease Maternal Grandfather     ROS: Review of Systems  Constitutional: Positive for malaise/fatigue. Negative for weight loss.  Respiratory: Positive for shortness of breath.   Cardiovascular: Negative for chest pain and orthopnea.  Skin: Positive for itching.       Positive for dryness.  Endo/Heme/Allergies:       Positive for hyperglycemia. Positive for hypoglycemic episodes. Negative for polyphagia    PHYSICAL EXAM: Blood pressure (!) 143/84, pulse 71, temperature 98 F (36.7 C), temperature source Oral, height 5\' 3"  (1.6 m), weight 235 lb (106.6 kg), SpO2 98 %. Body mass index is 41.63 kg/m. Physical Exam Vitals signs reviewed.  Constitutional:      Appearance: Normal appearance. She is obese.  HENT:     Head: Normocephalic and atraumatic.     Nose: Nose normal.  Eyes:     General: No scleral icterus.    Extraocular Movements: Extraocular movements intact.  Neck:     Musculoskeletal: Normal range of motion and neck supple.     Comments: Negative for thyromegaly. Positive for significant neck acrochordan. Cardiovascular:     Rate and Rhythm: Normal rate and regular rhythm.  Pulmonary:     Effort: Pulmonary effort is normal. No respiratory distress.  Abdominal:     Palpations: Abdomen is soft.     Tenderness: There is no abdominal tenderness.     Comments: Positive for obesity.  Musculoskeletal:     Comments: ROM normal in all extremities.  Skin:    General: Skin is warm and dry.  Neurological:     Mental Status: She is alert and oriented to person, place, and time.     Coordination: Coordination normal.  Psychiatric:        Mood and Affect: Mood normal.        Behavior: Behavior normal.     RECENT LABS AND TESTS: BMET    Component Value Date/Time   NA 138 08/15/2018 1340   K 3.7 08/15/2018 1340   CL 102 08/15/2018 1340   CO2 27 08/15/2018 1340   GLUCOSE 82 08/15/2018 1340   GLUCOSE 97 12/24/2017 0818   BUN 10 08/15/2018 1340   CREATININE  0.55 (L) 08/15/2018 1340   CREATININE 0.63 12/24/2017 0818   CALCIUM 9.5 08/15/2018 1340   GFRNONAA 114 08/15/2018 1340   GFRNONAA 109 12/24/2017 0818  GFRAA 132 08/15/2018 1340   GFRAA 126 12/24/2017 0818   No results found for: HGBA1C No results found for: INSULIN CBC    Component Value Date/Time   WBC 13.0 (H) 08/15/2018 1340   WBC 9.6 12/24/2017 0818   RBC 4.66 08/15/2018 1340   RBC 5.06 12/24/2017 0818   HGB 13.5 08/15/2018 1340   HCT 40.2 08/15/2018 1340   PLT 314 08/15/2018 1340   MCV 86 08/15/2018 1340   MCH 29.0 08/15/2018 1340   MCH 29.2 12/24/2017 0818   MCHC 33.6 08/15/2018 1340   MCHC 33.4 12/24/2017 0818   RDW 13.9 08/15/2018 1340   LYMPHSABS 4,445 (H) 12/24/2017 0818   MONOABS 690 07/23/2016 1109   EOSABS 125 12/24/2017 0818   BASOSABS 29 12/24/2017 0818   Iron/TIBC/Ferritin/ %Sat No results found for: IRON, TIBC, FERRITIN, IRONPCTSAT Lipid Panel     Component Value Date/Time   CHOL 208 (H) 12/24/2017 0818   TRIG 126 12/24/2017 0818   HDL 50 (L) 12/24/2017 0818   CHOLHDL 4.2 12/24/2017 0818   VLDL 22 03/11/2015 0847   LDLCALC 134 (H) 12/24/2017 0818   Hepatic Function Panel     Component Value Date/Time   PROT 7.2 12/24/2017 0818   ALBUMIN 4.0 10/18/2015 1508   AST 12 12/24/2017 0818   ALT 11 12/24/2017 0818   ALKPHOS 69 10/18/2015 1508   BILITOT 0.3 12/24/2017 0818      Component Value Date/Time   TSH 3.21 12/24/2017 0818   TSH 3.24 10/01/2016 1643   TSH 6.04 (H) 07/23/2016 1109   ECG  shows NSR with a rate of 78 BPM. INDIRECT CALORIMETER done today shows a VO2 of 342 and a REE of 2378.  Her calculated basal metabolic rate is 5102 thus her basal metabolic rate is better than expected.  OBESITY BEHAVIORAL INTERVENTION VISIT  Today's visit was # 1  Starting weight: 235 lbs Starting date: 11/30/2018 Today's weight : Weight: 235 lb (106.6 kg)  Today's date: 11/30/2018 Total lbs lost to date: 0  ASK: We discussed the diagnosis of  obesity with Maureen Chatters today and Shalena agreed to give [name] permission to discuss obesity behavioral modification therapy today.  ASSESS: Daja has the diagnosis of obesity and her BMI today is 41.6. Tracey is in the action stage of change.   ADVISE: Kenecia was educated on the multiple health risks of obesity as well as the benefit of weight loss to improve her health. She was advised of the need for long term treatment and the importance of lifestyle modifications to improve her current health and to decrease her risk of future health problems.  AGREE: Multiple dietary modification options and treatment options were discussed and Krystyn agreed to follow the recommendations documented in the above note.  ARRANGE: Revia was educated on the importance of frequent visits to treat obesity as outlined per CMS and USPSTF guidelines and agreed to schedule her next follow up appointment today.  I, Marcille Blanco, am acting as transcriptionist for Starlyn Skeans, MD  I have reviewed the above documentation for accuracy and completeness, and I agree with the above. -Dennard Nip, MD

## 2018-12-01 LAB — CBC WITH DIFFERENTIAL/PLATELET
BASOS: 0 %
Basophils Absolute: 0 10*3/uL (ref 0.0–0.2)
EOS (ABSOLUTE): 0.1 10*3/uL (ref 0.0–0.4)
EOS: 1 %
Hematocrit: 39.1 % (ref 34.0–46.6)
Hemoglobin: 13.6 g/dL (ref 11.1–15.9)
IMMATURE GRANS (ABS): 0 10*3/uL (ref 0.0–0.1)
IMMATURE GRANULOCYTES: 0 %
LYMPHS: 35 %
Lymphocytes Absolute: 3.4 10*3/uL — ABNORMAL HIGH (ref 0.7–3.1)
MCH: 30.2 pg (ref 26.6–33.0)
MCHC: 34.8 g/dL (ref 31.5–35.7)
MCV: 87 fL (ref 79–97)
Monocytes Absolute: 0.5 10*3/uL (ref 0.1–0.9)
Monocytes: 6 %
NEUTROS PCT: 58 %
Neutrophils Absolute: 5.7 10*3/uL (ref 1.4–7.0)
PLATELETS: 303 10*3/uL (ref 150–450)
RBC: 4.51 x10E6/uL (ref 3.77–5.28)
RDW: 12.9 % (ref 11.7–15.4)
WBC: 9.8 10*3/uL (ref 3.4–10.8)

## 2018-12-01 LAB — T3: T3 TOTAL: 126 ng/dL (ref 71–180)

## 2018-12-01 LAB — T4, FREE: Free T4: 0.84 ng/dL (ref 0.82–1.77)

## 2018-12-01 LAB — VITAMIN B12: Vitamin B-12: 431 pg/mL (ref 232–1245)

## 2018-12-01 LAB — COMPREHENSIVE METABOLIC PANEL
A/G RATIO: 1.6 (ref 1.2–2.2)
ALK PHOS: 82 IU/L (ref 39–117)
ALT: 15 IU/L (ref 0–32)
AST: 13 IU/L (ref 0–40)
Albumin: 4.2 g/dL (ref 3.5–5.5)
BUN/Creatinine Ratio: 21 (ref 9–23)
BUN: 12 mg/dL (ref 6–24)
Bilirubin Total: 0.2 mg/dL (ref 0.0–1.2)
CHLORIDE: 101 mmol/L (ref 96–106)
CO2: 23 mmol/L (ref 20–29)
Calcium: 8.9 mg/dL (ref 8.7–10.2)
Creatinine, Ser: 0.57 mg/dL (ref 0.57–1.00)
GFR calc Af Amer: 129 mL/min/{1.73_m2} (ref >59)
GFR calc non Af Amer: 112 mL/min/{1.73_m2} (ref >59)
GLOBULIN, TOTAL: 2.6 g/dL (ref 1.5–4.5)
Glucose: 92 mg/dL (ref 65–99)
POTASSIUM: 4.3 mmol/L (ref 3.5–5.2)
SODIUM: 140 mmol/L (ref 134–144)
Total Protein: 6.8 g/dL (ref 6.0–8.5)

## 2018-12-01 LAB — LIPID PANEL
CHOLESTEROL TOTAL: 189 mg/dL (ref 100–199)
Chol/HDL Ratio: 4.4 ratio (ref 0.0–4.4)
HDL: 43 mg/dL (ref >39)
LDL Calculated: 114 mg/dL — ABNORMAL HIGH (ref 0–99)
TRIGLYCERIDES: 161 mg/dL — AB (ref 0–149)
VLDL CHOLESTEROL CAL: 32 mg/dL (ref 5–40)

## 2018-12-01 LAB — INSULIN, RANDOM: INSULIN: 14.5 u[IU]/mL (ref 2.6–24.9)

## 2018-12-01 LAB — HEMOGLOBIN A1C
Est. average glucose Bld gHb Est-mCnc: 126 mg/dL
HEMOGLOBIN A1C: 6 % — AB (ref 4.8–5.6)

## 2018-12-01 LAB — VITAMIN D 25 HYDROXY (VIT D DEFICIENCY, FRACTURES): Vit D, 25-Hydroxy: 7.8 ng/mL — ABNORMAL LOW (ref 30.0–100.0)

## 2018-12-01 LAB — TSH: TSH: 4.28 u[IU]/mL (ref 0.450–4.500)

## 2018-12-01 LAB — FOLATE: FOLATE: 8.9 ng/mL (ref >3.0)

## 2018-12-14 ENCOUNTER — Ambulatory Visit (INDEPENDENT_AMBULATORY_CARE_PROVIDER_SITE_OTHER): Payer: 59 | Admitting: Family Medicine

## 2018-12-14 ENCOUNTER — Encounter (INDEPENDENT_AMBULATORY_CARE_PROVIDER_SITE_OTHER): Payer: Self-pay | Admitting: Family Medicine

## 2018-12-14 VITALS — BP 124/76 | HR 75 | Temp 97.9°F | Ht 63.0 in | Wt 229.0 lb

## 2018-12-14 DIAGNOSIS — R7303 Prediabetes: Secondary | ICD-10-CM | POA: Diagnosis not present

## 2018-12-14 DIAGNOSIS — Z6841 Body Mass Index (BMI) 40.0 and over, adult: Secondary | ICD-10-CM

## 2018-12-14 DIAGNOSIS — Z9189 Other specified personal risk factors, not elsewhere classified: Secondary | ICD-10-CM | POA: Diagnosis not present

## 2018-12-14 DIAGNOSIS — E782 Mixed hyperlipidemia: Secondary | ICD-10-CM

## 2018-12-14 DIAGNOSIS — E559 Vitamin D deficiency, unspecified: Secondary | ICD-10-CM | POA: Diagnosis not present

## 2018-12-14 MED ORDER — VITAMIN D (ERGOCALCIFEROL) 1.25 MG (50000 UNIT) PO CAPS: 50000.0000 [IU] | ORAL_CAPSULE | ORAL | 0 refills | Status: DC

## 2018-12-14 MED FILL — VIT D2 1.25 MG (50,000 UNIT: 1.25 MG | 28 days supply | Qty: 4 | Fill #0

## 2018-12-15 NOTE — Progress Notes (Signed)
Office: [phone]  /  Fax: [phone]   HPI:   Chief Complaint: OBESITY [name] is here to discuss her progress with her obesity treatment plan. She is on the Category 3 plan and is following her eating plan approximately 90 % of the time. She states she is exercising 0 minutes 0 times per week. [name] did well with weight loss on her Category 3 plan. She struggled to eat all of her food for breakfast and had meal challenges with her sister's big health scare.  Her weight is 229 lb (103.9 kg) today and has had a weight loss of 6 pounds over a period of 2 weeks since her last visit. She has lost 6 lbs since starting treatment with [name].  Hyperlipidemia (Mixed) [name] has a new diagnosis of hyperlipidemia and would like to try diet control to improve her cholesterol levels with intensive lifestyle modification including a low saturated fat diet, exercise and weight loss. She denies any chest pain.  Vitamin D deficiency [name] has a diagnosis of vitamin D deficiency. She is not currently taking vit D and has been on prescription vitamin D in the past. She admits fatigue.   Pre-Diabetes [name] has a new diagnosis of pre-diabetes based on her elevated Hgb A1c and was informed this puts her at greater risk of developing diabetes. Her A1c was 6.0 on 11/30/17. She has a strong family history of uncontrolled diabetes. [name] did well on her diet prescription and weight loss. She is not taking metformin currently and continues to work on diet and exercise to decrease risk of diabetes. She admits polyphagia.  At risk for diabetes [name] is at higher than average risk for developing diabetes due to her pre-diabetes and obesity. She currently denies polyuria or polydipsia.  ASSESSMENT AND PLAN:  Mixed hyperlipidemia  Vitamin D deficiency - Plan: Vitamin D, Ergocalciferol, (DRISDOL) 1.25 MG (50000 UT) CAPS capsule  Prediabetes  At risk for diabetes mellitus  Class 3 severe obesity with  serious comorbidity and body mass index (BMI) of 40.0 to 44.9 in adult, unspecified obesity type ([name])  PLAN:  Hyperlipidemia [name] was informed of the American Heart Association Guidelines emphasizing intensive lifestyle modifications as the first line treatment for hyperlipidemia. We discussed many lifestyle modifications today in depth, and [name] will continue to work on decreasing saturated fats such as fatty red meat, butter and many fried foods. She will also increase vegetables and lean protein in her diet and continue to work on exercise and weight loss efforts. Ahlam agrees to continue with diet and exercise and we will recheck her labs in 3 months. She agrees to follow up in 2 weeks.  Vitamin D Deficiency [name] was informed that low vitamin D levels contributes to fatigue and are associated with obesity, breast, and colon cancer. She agrees to start to take prescription Vit D @50 ,000 IU every week #4 with no refills and will follow up for routine testing of vitamin D, at least 2-3 times per year. She was informed of the risk of over-replacement of vitamin D and agrees to not increase her dose unless she discusses this with [name] first. Basha agrees to follow up at the agreed upon time.  Pre-Diabetes [name] will continue to work on weight loss, exercise, and decreasing simple carbohydrates in her diet to help decrease the risk of diabetes. We discussed metformin including benefits and risks. She was informed that eating too many simple carbohydrates or too many calories at one sitting increases the likelihood of GI  side effects. [name] deferred metformin for now and a prescription was not written today. [name] agreed to continue her diet and weight loss and we will recheck labs in 3 months. She agreed to follow up with [name] as directed to monitor her progress.  Diabetes risk counseling [name] was given extended (30 minutes) diabetes prevention counseling today. She is 48 y.o. female  and has risk factors for diabetes including pre-diabetes and obesity. We discussed intensive lifestyle modifications today with an emphasis on weight loss as well as increasing exercise and decreasing simple carbohydrates in her diet.  Obesity [name] is currently in the action stage of change. As such, her goal is to continue with weight loss efforts. She has agreed to follow the Category 3 plan. [name] has been instructed to work up to a goal of 150 minutes of combined cardio and strengthening exercise per week for weight loss and overall health benefits. We discussed the following Behavioral Modification Strategies today: increasing lean protein intake, decreasing simple carbohydrates, and work on meal planning and easy cooking plans.  [name] has agreed to follow up with our clinic in 2 weeks. She was informed of the importance of frequent follow up visits to maximize her success with intensive lifestyle modifications for her multiple health conditions.  ALLERGIES: Allergies  Allergen Reactions  . Adhesive [Tape] Rash  . Latex Rash    MEDICATIONS: Current Outpatient Medications on File Prior to Visit  Medication Sig Dispense Refill  . albuterol (PROVENTIL HFA;VENTOLIN HFA) 108 (90 Base) MCG/ACT inhaler Inhale 2 puffs into the lungs every 6 (six) hours as needed for wheezing. 1 Inhaler 11  . ALPRAZolam (XANAX) 0.25 MG tablet Take 1 tablet (0.25 mg total) by mouth daily as needed for anxiety. 30 tablet 1  . amphetamine-dextroamphetamine (ADDERALL) 20 MG tablet Take 1 tablet (20 mg total) by mouth daily. 30 tablet 0  . furosemide (LASIX) 20 MG tablet Take 2 tablets (40 mg total) by mouth daily as needed (swelling). 30 tablet 2  . ibuprofen (ADVIL,MOTRIN) 200 MG tablet Take 400 mg by mouth 2 (two) times daily as needed for headache or moderate pain.    . metoprolol tartrate (LOPRESSOR) 100 MG tablet Take 1 tablet (100 mg total) by mouth 2 (two) times daily. 60 tablet 11  . nitroGLYCERIN  (NITROSTAT) 0.4 MG SL tablet Place 1 tablet (0.4 mg total) under the tongue every 5 (five) minutes as needed for chest pain. 25 tablet 3   No current facility-administered medications on file prior to visit.     PAST MEDICAL HISTORY: Past Medical History:  Diagnosis Date  . Anxiety   . Asthma   . Back pain   . Chest pain   . Chronic CHF ([place])   . Dyspnea   . Echocardiogram 10/25/2015   EF 60-65, normal wall motion, grade 1 diastolic dysfunction, GLS -19.1%  . Edema of left lower extremity   . Fracture dislocation of elbow joint 01/15/2013   right  . GERD (gastroesophageal reflux disease)    Omeprazole as needed, none in 6 mos.  . Hyperlipidemia   . Hypertension   . Joint pain   . Obesity   . Palpitations   . Prediabetes   . R/L Cardiac catheterization 08/18/2018   Normal coronary arteries, mean RA 6, mean PA 17, PCWP 10  . Seborrheic dermatitis   . Sleep apnea    uses CPAP "most nights"    PAST SURGICAL HISTORY: Past Surgical History:  Procedure Laterality Date  . CESAREAN  SECTION  1998; 10/10/2000   x 2  . KNEE ARTHROSCOPY Left 1991   also in 2019  . LAPAROSCOPIC ASSISTED VAGINAL HYSTERECTOMY  11/02/2006  . LAPAROSCOPIC LYSIS OF ADHESIONS  03/06/2005  . RADIAL HEAD ARTHROPLASTY Right 01/17/2013   Procedure: RIGHT RADIAL HEAD ARTHROPLASTY OPEN REDUCTION INTERNAL FIXATION CORONOID ;  Surgeon: Tennis Must, MD;  Location: [facility];  Service: Orthopedics;  Laterality: Right;  . RIGHT/LEFT HEART CATH AND CORONARY ANGIOGRAPHY N/A 08/18/2018   Procedure: RIGHT/LEFT HEART CATH AND CORONARY ANGIOGRAPHY;  Surgeon: Nelva Bush, MD;  Location: [facility];  Service: Cardiovascular;  Laterality: N/A;  . TUBAL LIGATION  03/06/2005    SOCIAL HISTORY: Social History   Tobacco Use  . Smoking status: Never Smoker  . Smokeless tobacco: Never Used  Substance Use Topics  . Alcohol use: No  . Drug use: No    FAMILY HISTORY: Family History  Problem  Relation Age of Onset  . Diabetes Sister   . Heart disease Father 74       MI--1st Dx CAD. Died2that time  . Sudden death Father   . Diabetes Mother   . Hypertension Mother   . Heart disease Mother   . Obesity Mother   . Heart disease Maternal Uncle   . Ovarian cancer Maternal Grandmother   . Diabetes Maternal Grandmother   . Heart disease Maternal Grandmother        Event organiser  . Diabetes Paternal Grandmother   . Liver disease Paternal Aunt   . Heart disease Maternal Grandfather     ROS: Review of Systems  Constitutional: Positive for malaise/fatigue and weight loss.  Cardiovascular: Negative for chest pain.  Genitourinary:       Negative for polyuria.  Endo/Heme/Allergies: Negative for polydipsia.       Positive for polyphagia.    PHYSICAL EXAM: Blood pressure 124/76, pulse 75, temperature 97.9 F (36.6 C), temperature source Oral, height 5\' 3"  (1.6 m), weight 229 lb (103.9 kg), SpO2 98 %. Body mass index is 40.57 kg/m. Physical Exam Vitals signs reviewed.  Constitutional:      Appearance: Normal appearance. She is obese.  Cardiovascular:     Rate and Rhythm: Normal rate.  Pulmonary:     Effort: Pulmonary effort is normal.  Musculoskeletal: Normal range of motion.  Skin:    General: Skin is warm and dry.  Neurological:     Mental Status: She is alert and oriented to person, place, and time.  Psychiatric:        Mood and Affect: Mood normal.        Behavior: Behavior normal.     RECENT LABS AND TESTS: BMET    Component Value Date/Time   NA 140 11/30/2018 1047   K 4.3 11/30/2018 1047   CL 101 11/30/2018 1047   CO2 23 11/30/2018 1047   GLUCOSE 92 11/30/2018 1047   GLUCOSE 97 12/24/2017 0818   BUN 12 11/30/2018 1047   CREATININE 0.57 11/30/2018 1047   CREATININE 0.63 12/24/2017 0818   CALCIUM 8.9 11/30/2018 1047   GFRNONAA 112 11/30/2018 1047   GFRNONAA 109 12/24/2017 0818   GFRAA 129 11/30/2018 1047   GFRAA 126 12/24/2017 0818   Lab  Results  Component Value Date   HGBA1C 6.0 (H) 11/30/2018   Lab Results  Component Value Date   INSULIN 14.5 11/30/2018   CBC    Component Value Date/Time   WBC 9.8 11/30/2018 1047   WBC 9.6 12/24/2017 0818  RBC 4.51 11/30/2018 1047   RBC 5.06 12/24/2017 0818   HGB 13.6 11/30/2018 1047   HCT 39.1 11/30/2018 1047   PLT 303 11/30/2018 1047   MCV 87 11/30/2018 1047   MCH 30.2 11/30/2018 1047   MCH 29.2 12/24/2017 0818   MCHC 34.8 11/30/2018 1047   MCHC 33.4 12/24/2017 0818   RDW 12.9 11/30/2018 1047   LYMPHSABS 3.4 (H) 11/30/2018 1047   MONOABS 690 07/23/2016 1109   EOSABS 0.1 11/30/2018 1047   BASOSABS 0.0 11/30/2018 1047   Iron/TIBC/Ferritin/ %Sat No results found for: IRON, TIBC, FERRITIN, IRONPCTSAT Lipid Panel     Component Value Date/Time   CHOL 189 11/30/2018 1047   TRIG 161 (H) 11/30/2018 1047   HDL 43 11/30/2018 1047   CHOLHDL 4.4 11/30/2018 1047   CHOLHDL 4.2 12/24/2017 0818   VLDL 22 03/11/2015 0847   LDLCALC 114 (H) 11/30/2018 1047   LDLCALC 134 (H) 12/24/2017 0818   Hepatic Function Panel     Component Value Date/Time   PROT 6.8 11/30/2018 1047   ALBUMIN 4.2 11/30/2018 1047   AST 13 11/30/2018 1047   ALT 15 11/30/2018 1047   ALKPHOS 82 11/30/2018 1047   BILITOT 0.2 11/30/2018 1047      Component Value Date/Time   TSH 4.280 11/30/2018 1047   TSH 3.21 12/24/2017 0818   TSH 3.24 10/01/2016 1643   Results for QUANA, CHAMBERLAIN (MRN [id number]) as of 12/15/2018 11:03  Ref. Range 11/30/2018 10:47  Vitamin D, 25-Hydroxy Latest Ref Range: 30.0 - 100.0 ng/mL 7.8 (L)    OBESITY BEHAVIORAL INTERVENTION VISIT  Today's visit was # 2   Starting weight: 235 lbs Starting date: 11/30/2018 Today's weight : Weight: 229 lb (103.9 kg)  Today's date: 12/14/2018 Total lbs lost to date: 6  ASK: We discussed the diagnosis of obesity with [name] today and [name] agreed to give [name] permission to discuss obesity behavioral modification therapy  today.  ASSESS: [name] has the diagnosis of obesity and her BMI today is 40.5. [name] is in the action stage of change.   ADVISE: [name] was educated on the multiple health risks of obesity as well as the benefit of weight loss to improve her health. She was advised of the need for long term treatment and the importance of lifestyle modifications to improve her current health and to decrease her risk of future health problems.  AGREE: Multiple dietary modification options and treatment options were discussed and [name] agreed to follow the recommendations documented in the above note.  ARRANGE: [name] was educated on the importance of frequent visits to treat obesity as outlined per CMS and USPSTF guidelines and agreed to schedule her next follow up appointment today.  IMarcille Blanco, CMA, am acting as transcriptionist for Starlyn Skeans, MD  I have reviewed the above documentation for accuracy and completeness, and I agree with the above. -Dennard Nip, MD

## 2018-12-19 ENCOUNTER — Encounter (INDEPENDENT_AMBULATORY_CARE_PROVIDER_SITE_OTHER): Payer: Self-pay | Admitting: Family Medicine

## 2018-12-20 NOTE — Telephone Encounter (Signed)
This patient was given hummus and carrots? Please look into this.

## 2018-12-26 ENCOUNTER — Encounter (INDEPENDENT_AMBULATORY_CARE_PROVIDER_SITE_OTHER): Payer: Self-pay | Admitting: Family Medicine

## 2018-12-28 ENCOUNTER — Ambulatory Visit (INDEPENDENT_AMBULATORY_CARE_PROVIDER_SITE_OTHER): Payer: 59 | Admitting: Family Medicine

## 2018-12-28 ENCOUNTER — Encounter (INDEPENDENT_AMBULATORY_CARE_PROVIDER_SITE_OTHER): Payer: Self-pay | Admitting: Family Medicine

## 2018-12-28 VITALS — BP 120/72 | HR 74 | Temp 98.1°F | Ht 63.0 in | Wt 224.0 lb

## 2018-12-28 DIAGNOSIS — R7303 Prediabetes: Secondary | ICD-10-CM

## 2018-12-28 DIAGNOSIS — Z6839 Body mass index (BMI) 39.0-39.9, adult: Secondary | ICD-10-CM | POA: Diagnosis not present

## 2018-12-28 NOTE — Progress Notes (Signed)
Office: [phone]  /  Fax: [phone]   HPI:   Chief Complaint: OBESITY [name] is here to discuss her progress with her obesity treatment plan. She is on the Category 3 plan and is following her eating plan approximately 100% of the time. She states she is exercising 0 minutes 0 times per week. [name] is eating all food most days, although she does not always have time for snacks. She struggles with eating out and making healthy choices at restaurants. She eats out after church on Sundays.  Her weight is 224 lb (101.6 kg) today and has had a weight loss of 5 pounds over a period of 2 weeks since her last visit. She has lost 11 lbs since starting treatment with [name].  Pre-Diabetes [name] has a diagnosis of prediabetes based on her elevated HgbA1c and was informed this puts her at greater risk of developing diabetes. She is not taking metformin currently and continues to work on diet and exercise to decrease risk of diabetes. She denies polyphagia.  ASSESSMENT AND PLAN:  Prediabetes  Class 2 severe obesity with serious comorbidity and body mass index (BMI) of 39.0 to 39.9 in adult, unspecified obesity type [name] [employer]  PLAN:  Pre-Diabetes [name] will continue to work on weight loss, exercise, and decreasing simple carbohydrates in her diet to help decrease the risk of diabetes.  [name] is not on metformin for now and a prescription was not written today. Sivan agreed to follow-up with [name] as directed to monitor her progress.  I spent > than 50% of the 15 minute visit on counseling as documented in the note.  Obesity [name] is currently in the action stage of change. As such, her goal is to continue with weight loss efforts. She has agreed to follow the Category 3 plan +350-500 calories and 35 grams of protein daily at lunch. [name] has not been prescribed exercise at this time.  We discussed the following Behavioral Modification Strategies today: increasing lean protein intake,  work on meal planning and easy cooking plans, and planning for success.  [name] has agreed to follow up with our clinic in 2 weeks. She was informed of the importance of frequent follow up visits to maximize her success with intensive lifestyle modifications for her multiple health conditions.  ALLERGIES: Allergies  Allergen Reactions  . Adhesive [Tape] Rash  . Latex Rash    MEDICATIONS: Current Outpatient Medications on File Prior to Visit  Medication Sig Dispense Refill  . albuterol (PROVENTIL HFA;VENTOLIN HFA) 108 (90 Base) MCG/ACT inhaler Inhale 2 puffs into the lungs every 6 (six) hours as needed for wheezing. 1 Inhaler 11  . ALPRAZolam (XANAX) 0.25 MG tablet Take 1 tablet (0.25 mg total) by mouth daily as needed for anxiety. 30 tablet 1  . amphetamine-dextroamphetamine (ADDERALL) 20 MG tablet Take 1 tablet (20 mg total) by mouth daily. 30 tablet 0  . furosemide (LASIX) 20 MG tablet Take 2 tablets (40 mg total) by mouth daily as needed (swelling). 30 tablet 2  . ibuprofen (ADVIL,MOTRIN) 200 MG tablet Take 400 mg by mouth 2 (two) times daily as needed for headache or moderate pain.    . metoprolol tartrate (LOPRESSOR) 100 MG tablet Take 1 tablet (100 mg total) by mouth 2 (two) times daily. 60 tablet 11  . nitroGLYCERIN (NITROSTAT) 0.4 MG SL tablet Place 1 tablet (0.4 mg total) under the tongue every 5 (five) minutes as needed for chest pain. 25 tablet 3  . Vitamin D, Ergocalciferol, (DRISDOL) 1.25 MG (50000  UT) CAPS capsule Take 1 capsule (50,000 Units total) by mouth every 7 (seven) days. 4 capsule 0   No current facility-administered medications on file prior to visit.     PAST MEDICAL HISTORY: Past Medical History:  Diagnosis Date  . Anxiety   . Asthma   . Back pain   . Chest pain   . Chronic CHF ([place])   . Dyspnea   . Echocardiogram 10/25/2015   EF 60-65, normal wall motion, grade 1 diastolic dysfunction, GLS -19.1%  . Edema of left lower extremity   . Fracture  dislocation of elbow joint 01/15/2013   right  . GERD (gastroesophageal reflux disease)    Omeprazole as needed, none in 6 mos.  . Hyperlipidemia   . Hypertension   . Joint pain   . Obesity   . Palpitations   . Prediabetes   . R/L Cardiac catheterization 08/18/2018   Normal coronary arteries, mean RA 6, mean PA 17, PCWP 10  . Seborrheic dermatitis   . Sleep apnea    uses CPAP "most nights"    PAST SURGICAL HISTORY: Past Surgical History:  Procedure Laterality Date  . [place]; 10/10/2000   x 2  . KNEE ARTHROSCOPY Left 1991   also in 2019  . LAPAROSCOPIC ASSISTED VAGINAL HYSTERECTOMY  11/02/2006  . LAPAROSCOPIC LYSIS OF ADHESIONS  03/06/2005  . RADIAL HEAD ARTHROPLASTY Right 01/17/2013   Procedure: RIGHT RADIAL HEAD ARTHROPLASTY OPEN REDUCTION INTERNAL FIXATION CORONOID ;  Surgeon: Tennis Must, MD;  Location: [facility];  Service: Orthopedics;  Laterality: Right;  . RIGHT/LEFT HEART CATH AND CORONARY ANGIOGRAPHY N/A 08/18/2018   Procedure: RIGHT/LEFT HEART CATH AND CORONARY ANGIOGRAPHY;  Surgeon: Nelva Bush, MD;  Location: [facility];  Service: Cardiovascular;  Laterality: N/A;  . TUBAL LIGATION  03/06/2005    SOCIAL HISTORY: Social History   Tobacco Use  . Smoking status: Never Smoker  . Smokeless tobacco: Never Used  Substance Use Topics  . Alcohol use: No  . Drug use: No    FAMILY HISTORY: Family History  Problem Relation Age of Onset  . Diabetes Sister   . Heart disease Father 64       MI--1st Dx CAD. Died2that time  . Sudden death Father   . Diabetes Mother   . Hypertension Mother   . Heart disease Mother   . Obesity Mother   . Heart disease Maternal Uncle   . Ovarian cancer Maternal Grandmother   . Diabetes Maternal Grandmother   . Heart disease Maternal Grandmother        Event organiser  . Diabetes Paternal Grandmother   . Liver disease Paternal Aunt   . Heart disease Maternal Grandfather    ROS: Review of  Systems  Constitutional: Positive for weight loss.  Endo/Heme/Allergies:       Negative for polyphagia.   PHYSICAL EXAM: Blood pressure 120/72, pulse 74, temperature 98.1 F (36.7 C), temperature source Oral, height 5\' 3"  (1.6 m), weight 224 lb (101.6 kg), SpO2 95 %. Body mass index is 39.68 kg/m. Physical Exam Vitals signs reviewed.  Constitutional:      Appearance: Normal appearance. She is obese.  Cardiovascular:     Rate and Rhythm: Normal rate.     Pulses: Normal pulses.  Pulmonary:     Effort: Pulmonary effort is normal.     Breath sounds: Normal breath sounds.  Musculoskeletal: Normal range of motion.  Skin:    General: Skin is warm and  dry.  Neurological:     Mental Status: She is alert and oriented to person, place, and time.  Psychiatric:        Behavior: Behavior normal.   RECENT LABS AND TESTS: BMET    Component Value Date/Time   NA 140 11/30/2018 1047   K 4.3 11/30/2018 1047   CL 101 11/30/2018 1047   CO2 23 11/30/2018 1047   GLUCOSE 92 11/30/2018 1047   GLUCOSE 97 12/24/2017 0818   BUN 12 11/30/2018 1047   CREATININE 0.57 11/30/2018 1047   CREATININE 0.63 12/24/2017 0818   CALCIUM 8.9 11/30/2018 1047   GFRNONAA 112 11/30/2018 1047   GFRNONAA 109 12/24/2017 0818   GFRAA 129 11/30/2018 1047   GFRAA 126 12/24/2017 0818   Lab Results  Component Value Date   HGBA1C 6.0 (H) 11/30/2018   Lab Results  Component Value Date   INSULIN 14.5 11/30/2018   CBC    Component Value Date/Time   WBC 9.8 11/30/2018 1047   WBC 9.6 12/24/2017 0818   RBC 4.51 11/30/2018 1047   RBC 5.06 12/24/2017 0818   HGB 13.6 11/30/2018 1047   HCT 39.1 11/30/2018 1047   PLT 303 11/30/2018 1047   MCV 87 11/30/2018 1047   MCH 30.2 11/30/2018 1047   MCH 29.2 12/24/2017 0818   MCHC 34.8 11/30/2018 1047   MCHC 33.4 12/24/2017 0818   RDW 12.9 11/30/2018 1047   LYMPHSABS 3.4 (H) 11/30/2018 1047   MONOABS 690 07/23/2016 1109   EOSABS 0.1 11/30/2018 1047   BASOSABS 0.0  11/30/2018 1047   Iron/TIBC/Ferritin/ %Sat No results found for: IRON, TIBC, FERRITIN, IRONPCTSAT Lipid Panel     Component Value Date/Time   CHOL 189 11/30/2018 1047   TRIG 161 (H) 11/30/2018 1047   HDL 43 11/30/2018 1047   CHOLHDL 4.4 11/30/2018 1047   CHOLHDL 4.2 12/24/2017 0818   VLDL 22 03/11/2015 0847   LDLCALC 114 (H) 11/30/2018 1047   LDLCALC 134 (H) 12/24/2017 0818   Hepatic Function Panel     Component Value Date/Time   PROT 6.8 11/30/2018 1047   ALBUMIN 4.2 11/30/2018 1047   AST 13 11/30/2018 1047   ALT 15 11/30/2018 1047   ALKPHOS 82 11/30/2018 1047   BILITOT 0.2 11/30/2018 1047      Component Value Date/Time   TSH 4.280 11/30/2018 1047   TSH 3.21 12/24/2017 0818   TSH 3.24 10/01/2016 1643   Results for CEASIA, ELWELL (MRN [id number]) as of 12/28/2018 12:49  Ref. Range 11/30/2018 10:47  Vitamin D, 25-Hydroxy Latest Ref Range: 30.0 - 100.0 ng/mL 7.8 (L)   OBESITY BEHAVIORAL INTERVENTION VISIT  Today's visit was #3  Starting weight: 235 lbs Starting date: 11/30/2018 Today's weight: 224 lbs Today's date: 12/28/2018 Total lbs lost to date: 65  ASK: We discussed the diagnosis of obesity with [name] today and [name] agreed to give [name] permission to discuss obesity behavioral modification therapy today.  ASSESS: [name] has the diagnosis of obesity and her BMI today is 39.68. [name] is in the action stage of change.   ADVISE: [name] was educated on the multiple health risks of obesity as well as the benefit of weight loss to improve her health. She was advised of the need for long term treatment and the importance of lifestyle modifications to improve her current health and to decrease her risk of future health problems.  AGREE: Multiple dietary modification options and treatment options were discussed and  [name] agreed to follow the recommendations  documented in the above note.  ARRANGE: [name] was educated on the importance of  frequent visits to treat obesity as outlined per CMS and USPSTF guidelines and agreed to schedule her next follow up appointment today.  IMichaelene Song, am acting as Location manager for [employer], FNP-C.  I have reviewed the above documentation for accuracy and completeness, and I agree with the above.  - Lauralei Clouse, FNP-C.

## 2018-12-29 ENCOUNTER — Encounter (INDEPENDENT_AMBULATORY_CARE_PROVIDER_SITE_OTHER): Payer: Self-pay | Admitting: Family Medicine

## 2018-12-29 DIAGNOSIS — R7303 Prediabetes: Secondary | ICD-10-CM | POA: Insufficient documentation

## 2019-01-04 ENCOUNTER — Other Ambulatory Visit: Payer: Self-pay | Admitting: Family Medicine

## 2019-01-04 NOTE — Telephone Encounter (Signed)
Pt requesting refill on Adderall      LOV: 12/29/17  LRF:   11/17/18

## 2019-01-04 NOTE — Telephone Encounter (Signed)
[name] needs a refill on her adderall, she has an appointment on the 28th, can this go ahead and be refilled? [facility]

## 2019-01-05 MED ORDER — AMPHETAMINE-DEXTROAMPHETAMINE 20 MG PO TABS: 20.0000 mg | ORAL_TABLET | Freq: Every day | ORAL | 0 refills | Status: DC

## 2019-01-05 MED FILL — DEXTROAMP-AMPHETAMIN 20 MG: 20 | 30 days supply | Qty: 30 | Fill #0

## 2019-01-11 ENCOUNTER — Ambulatory Visit (INDEPENDENT_AMBULATORY_CARE_PROVIDER_SITE_OTHER): Payer: 59 | Admitting: Family Medicine

## 2019-01-11 ENCOUNTER — Encounter (INDEPENDENT_AMBULATORY_CARE_PROVIDER_SITE_OTHER): Payer: Self-pay | Admitting: Family Medicine

## 2019-01-11 VITALS — BP 127/77 | HR 79 | Temp 98.1°F | Ht 63.0 in | Wt 224.0 lb

## 2019-01-11 DIAGNOSIS — Z6839 Body mass index (BMI) 39.0-39.9, adult: Secondary | ICD-10-CM

## 2019-01-11 DIAGNOSIS — R7303 Prediabetes: Secondary | ICD-10-CM | POA: Diagnosis not present

## 2019-01-11 DIAGNOSIS — E559 Vitamin D deficiency, unspecified: Secondary | ICD-10-CM

## 2019-01-11 DIAGNOSIS — Z9189 Other specified personal risk factors, not elsewhere classified: Secondary | ICD-10-CM

## 2019-01-11 MED ORDER — VITAMIN D (ERGOCALCIFEROL) 1.25 MG (50000 UNIT) PO CAPS: 50000.0000 [IU] | ORAL_CAPSULE | ORAL | 0 refills | Status: DC

## 2019-01-11 MED FILL — VIT D2 1.25 MG (50,000 UNIT: 1.25 MG | 28 days supply | Qty: 4 | Fill #0

## 2019-01-11 NOTE — Progress Notes (Signed)
Office: [phone]  /  Fax: [phone]   HPI:   Chief Complaint: OBESITY [name] is here to discuss her progress with her obesity treatment plan. She is on the Category 3 plan and is following her eating plan approximately 95 % of the time. She states she is exercising 0 minutes 0 times per week. [name] is currently frustrated with the lack of weight loss this visit. She is sticking to the plan well. She has occasional hunger when she does not eat all her protein at meals.  Her weight is 224 lb (101.6 kg) today and has not lost weight since her last visit. She has lost 11 lbs since starting treatment with [name].  Vitamin D deficiency [name] has a diagnosis of vitamin D deficiency. She is currently taking vit D and her last level was 7.8 on 11/30/18, but she is not at goal. She admits fatigue and denies nausea, vomiting, or muscle weakness.  At risk for osteopenia and osteoporosis [name] is at higher risk of osteopenia and osteoporosis due to vitamin D deficiency.   Pre-Diabetes [name] has a diagnosis of prediabetes based on her elevated Hgb A1c of 6.0 on 11/30/18 and was informed this puts her at greater risk of developing diabetes. She is not taking metformin currently and continues to work on diet and exercise to decrease risk of diabetes. She denies polyphagia.  ASSESSMENT AND PLAN:  Vitamin D deficiency - Plan: Vitamin D, Ergocalciferol, (DRISDOL) 1.25 MG (50000 UT) CAPS capsule  Prediabetes  At risk for osteoporosis  Class 2 severe obesity with serious comorbidity and body mass index (BMI) of 39.0 to 39.9 in adult, unspecified obesity type ([place])  PLAN:  Vitamin D Deficiency Robert was informed that low vitamin D levels contributes to fatigue and are associated with obesity, breast, and colon cancer. She agrees to continue to take prescription Vit D @50 ,000 IU every week #4 with no refills and will follow up for routine testing of vitamin D, at least 2-3 times per year.  She was informed of the risk of over-replacement of vitamin D and agrees to not increase her dose unless she discusses this with [name] first. [name] agrees to follow up in 2 to 3 weeks.  At risk for osteopenia and osteoporosis [name] was given extended (15 minutes) osteoporosis prevention counseling today. [name] is at risk for osteopenia and osteoporosis due to her vitamin D deficiency. She was encouraged to take her vitamin D and follow her higher calcium diet and increase strengthening exercise to help strengthen her bones and decrease her risk of osteopenia and osteoporosis.  Pre-Diabetes [name] will continue to work on weight loss, exercise, and decreasing simple carbohydrates in her diet to help decrease the risk of diabetes.  [name] agreed to continue her meal plan and she agreed to follow up with [name] as directed to monitor her progress.  Obesity [name] is currently in the action stage of change. As such, her goal is to continue with weight loss efforts. She has agreed to follow the Category 3 plan. [name] was encouraged to increase protein in snacks and to decrease carbs. We discussed the following Behavioral Modification Strategies today: increasing lean protein intake, better snacking options, and planning for success. I provided encouragement and advised her that she is making good progress.   [name] has agreed to follow up with our clinic in 2 to 3 weeks. She was informed of the importance of frequent follow up visits to maximize her success with intensive lifestyle modifications for her  multiple health conditions.  ALLERGIES: Allergies  Allergen Reactions  . Adhesive [Tape] Rash  . Latex Rash    MEDICATIONS: Current Outpatient Medications on File Prior to Visit  Medication Sig Dispense Refill  . albuterol (PROVENTIL HFA;VENTOLIN HFA) 108 (90 Base) MCG/ACT inhaler Inhale 2 puffs into the lungs every 6 (six) hours as needed for wheezing. 1 Inhaler 11  . ALPRAZolam (XANAX)  0.25 MG tablet Take 1 tablet (0.25 mg total) by mouth daily as needed for anxiety. 30 tablet 1  . amphetamine-dextroamphetamine (ADDERALL) 20 MG tablet Take 1 tablet (20 mg total) by mouth daily. 30 tablet 0  . furosemide (LASIX) 20 MG tablet Take 2 tablets (40 mg total) by mouth daily as needed (swelling). 30 tablet 2  . ibuprofen (ADVIL,MOTRIN) 200 MG tablet Take 400 mg by mouth 2 (two) times daily as needed for headache or moderate pain.    . metoprolol tartrate (LOPRESSOR) 100 MG tablet Take 1 tablet (100 mg total) by mouth 2 (two) times daily. 60 tablet 11  . nitroGLYCERIN (NITROSTAT) 0.4 MG SL tablet Place 1 tablet (0.4 mg total) under the tongue every 5 (five) minutes as needed for chest pain. 25 tablet 3   No current facility-administered medications on file prior to visit.     PAST MEDICAL HISTORY: Past Medical History:  Diagnosis Date  . Anxiety   . Asthma   . Back pain   . Chest pain   . Chronic CHF ([place])   . Dyspnea   . Echocardiogram 10/25/2015   EF 60-65, normal wall motion, grade 1 diastolic dysfunction, GLS -19.1%  . Edema of left lower extremity   . Fracture dislocation of elbow joint 01/15/2013   right  . GERD (gastroesophageal reflux disease)    Omeprazole as needed, none in 6 mos.  . Hyperlipidemia   . Hypertension   . Joint pain   . Obesity   . Palpitations   . Prediabetes   . R/L Cardiac catheterization 08/18/2018   Normal coronary arteries, mean RA 6, mean PA 17, PCWP 10  . Seborrheic dermatitis   . Sleep apnea    uses CPAP "most nights"    PAST SURGICAL HISTORY: Past Surgical History:  Procedure Laterality Date  . [place]; 10/10/2000   x 2  . KNEE ARTHROSCOPY Left 1991   also in 2019  . LAPAROSCOPIC ASSISTED VAGINAL HYSTERECTOMY  11/02/2006  . LAPAROSCOPIC LYSIS OF ADHESIONS  03/06/2005  . RADIAL HEAD ARTHROPLASTY Right 01/17/2013   Procedure: RIGHT RADIAL HEAD ARTHROPLASTY OPEN REDUCTION INTERNAL FIXATION CORONOID ;  Surgeon: Tennis Must, MD;  Location: [facility];  Service: Orthopedics;  Laterality: Right;  . RIGHT/LEFT HEART CATH AND CORONARY ANGIOGRAPHY N/A 08/18/2018   Procedure: RIGHT/LEFT HEART CATH AND CORONARY ANGIOGRAPHY;  Surgeon: Nelva Bush, MD;  Location: [facility];  Service: Cardiovascular;  Laterality: N/A;  . TUBAL LIGATION  03/06/2005    SOCIAL HISTORY: Social History   Tobacco Use  . Smoking status: Never Smoker  . Smokeless tobacco: Never Used  Substance Use Topics  . Alcohol use: No  . Drug use: No    FAMILY HISTORY: Family History  Problem Relation Age of Onset  . Diabetes Sister   . Heart disease Father 56       MI--1st Dx CAD. Died2that time  . Sudden death Father   . Diabetes Mother   . Hypertension Mother   . Heart disease Mother   . Obesity Mother   .  Heart disease Maternal Uncle   . Ovarian cancer Maternal Grandmother   . Diabetes Maternal Grandmother   . Heart disease Maternal Grandmother        Event organiser  . Diabetes Paternal Grandmother   . Liver disease Paternal Aunt   . Heart disease Maternal Grandfather     ROS: Review of Systems  Constitutional: Negative for weight loss.  Gastrointestinal: Negative for nausea and vomiting.  Musculoskeletal:       Negative for muscle weakness.  Endo/Heme/Allergies:       Negative for polyphagia.    PHYSICAL EXAM: Blood pressure 127/77, pulse 79, temperature 98.1 F (36.7 C), height 5\' 3"  (1.6 m), weight 224 lb (101.6 kg), SpO2 98 %. Body mass index is 39.68 kg/m. Physical Exam Vitals signs reviewed.  Constitutional:      Appearance: Normal appearance. She is obese.  Cardiovascular:     Rate and Rhythm: Normal rate.  Pulmonary:     Effort: Pulmonary effort is normal.  Musculoskeletal: Normal range of motion.  Skin:    General: Skin is warm and dry.  Neurological:     Mental Status: She is alert and oriented to person, place, and time.  Psychiatric:        Mood and Affect: Mood  normal.        Behavior: Behavior normal.     RECENT LABS AND TESTS: BMET    Component Value Date/Time   NA 140 11/30/2018 1047   K 4.3 11/30/2018 1047   CL 101 11/30/2018 1047   CO2 23 11/30/2018 1047   GLUCOSE 92 11/30/2018 1047   GLUCOSE 97 12/24/2017 0818   BUN 12 11/30/2018 1047   CREATININE 0.57 11/30/2018 1047   CREATININE 0.63 12/24/2017 0818   CALCIUM 8.9 11/30/2018 1047   GFRNONAA 112 11/30/2018 1047   GFRNONAA 109 12/24/2017 0818   GFRAA 129 11/30/2018 1047   GFRAA 126 12/24/2017 0818   Lab Results  Component Value Date   HGBA1C 6.0 (H) 11/30/2018   Lab Results  Component Value Date   INSULIN 14.5 11/30/2018   CBC    Component Value Date/Time   WBC 9.8 11/30/2018 1047   WBC 9.6 12/24/2017 0818   RBC 4.51 11/30/2018 1047   RBC 5.06 12/24/2017 0818   HGB 13.6 11/30/2018 1047   HCT 39.1 11/30/2018 1047   PLT 303 11/30/2018 1047   MCV 87 11/30/2018 1047   MCH 30.2 11/30/2018 1047   MCH 29.2 12/24/2017 0818   MCHC 34.8 11/30/2018 1047   MCHC 33.4 12/24/2017 0818   RDW 12.9 11/30/2018 1047   LYMPHSABS 3.4 (H) 11/30/2018 1047   MONOABS 690 07/23/2016 1109   EOSABS 0.1 11/30/2018 1047   BASOSABS 0.0 11/30/2018 1047   Iron/TIBC/Ferritin/ %Sat No results found for: IRON, TIBC, FERRITIN, IRONPCTSAT Lipid Panel     Component Value Date/Time   CHOL 189 11/30/2018 1047   TRIG 161 (H) 11/30/2018 1047   HDL 43 11/30/2018 1047   CHOLHDL 4.4 11/30/2018 1047   CHOLHDL 4.2 12/24/2017 0818   VLDL 22 03/11/2015 0847   LDLCALC 114 (H) 11/30/2018 1047   LDLCALC 134 (H) 12/24/2017 0818   Hepatic Function Panel     Component Value Date/Time   PROT 6.8 11/30/2018 1047   ALBUMIN 4.2 11/30/2018 1047   AST 13 11/30/2018 1047   ALT 15 11/30/2018 1047   ALKPHOS 82 11/30/2018 1047   BILITOT 0.2 11/30/2018 1047      Component Value Date/Time   TSH 4.280  11/30/2018 1047   TSH 3.21 12/24/2017 0818   TSH 3.24 10/01/2016 1643   Results for DANNAH, RYLES  (MRN [id number]) as of 01/11/2019 16:07  Ref. Range 11/30/2018 10:47  Vitamin D, 25-Hydroxy Latest Ref Range: 30.0 - 100.0 ng/mL 7.8 (L)   OBESITY BEHAVIORAL INTERVENTION VISIT  Today's visit was # 4   Starting weight: 235 lbs Starting date: 11/30/18 Today's weight : Weight: 224 lb (101.6 kg)  Today's date: 01/11/2019 Total lbs lost to date: 11    01/11/2019  Height 5\' 3"  (1.6 m)  Weight 224 lb (101.6 kg)  BMI (Calculated) 39.69  BLOOD PRESSURE - SYSTOLIC 559  BLOOD PRESSURE - DIASTOLIC 77   Body Fat % 74.1 %  Total Body Water (lbs) 89.4 lbs    ASK: We discussed the diagnosis of obesity with [name] today and [name] agreed to give [name] permission to discuss obesity behavioral modification therapy today.  ASSESS: [name] has the diagnosis of obesity and her BMI today is 36.69. [name] is in the action stage of change.   ADVISE: [name] was educated on the multiple health risks of obesity as well as the benefit of weight loss to improve her health. She was advised of the need for long term treatment and the importance of lifestyle modifications to improve her current health and to decrease her risk of future health problems.  AGREE: Multiple dietary modification options and treatment options were discussed and [name] agreed to follow the recommendations documented in the above note.  ARRANGE: [name] was educated on the importance of frequent visits to treat obesity as outlined per CMS and USPSTF guidelines and agreed to schedule her next follow up appointment today.  Lenward Chancellor, CMA, am acting as Location manager for [employer], FNP-C.  I have reviewed the above documentation for accuracy and completeness, and I agree with the above.  - Dawn Whitmire, FNP-C.

## 2019-01-13 ENCOUNTER — Ambulatory Visit (INDEPENDENT_AMBULATORY_CARE_PROVIDER_SITE_OTHER): Payer: 59 | Admitting: Family Medicine

## 2019-01-13 ENCOUNTER — Encounter: Payer: Self-pay | Admitting: Family Medicine

## 2019-01-13 VITALS — BP 110/70 | HR 102 | Temp 97.4°F | Resp 16 | Ht 63.0 in | Wt 227.0 lb

## 2019-01-13 DIAGNOSIS — I10 Essential (primary) hypertension: Secondary | ICD-10-CM

## 2019-01-13 DIAGNOSIS — F900 Attention-deficit hyperactivity disorder, predominantly inattentive type: Secondary | ICD-10-CM | POA: Diagnosis not present

## 2019-01-13 DIAGNOSIS — R7303 Prediabetes: Secondary | ICD-10-CM | POA: Diagnosis not present

## 2019-01-13 MED ORDER — FUROSEMIDE 20 MG PO TABS: 40.0000 mg | ORAL_TABLET | Freq: Every day | ORAL | 2 refills | Status: DC | PRN

## 2019-01-13 MED ORDER — AMPHETAMINE-DEXTROAMPHET ER 20 MG PO CP24: 20.0000 mg | ORAL_CAPSULE | ORAL | 0 refills | Status: DC

## 2019-01-13 MED FILL — FUROSEMIDE 20 MG TABS: 20 | 15 days supply | Qty: 30 | Fill #0

## 2019-01-13 MED FILL — ADDERALL XR 20 MG CAP SA: 20 | 30 days supply | Qty: 30 | Fill #0

## 2019-01-13 NOTE — Progress Notes (Signed)
Subjective:    Patient ID: [name], female    DOB: 07-29-1971, 48 y.o.   MRN: [id number]  HPI Patient was previously seeing my partner for ADD.  My partner has since retired.  She is here today for refill on her Adderall.  Originally she was prescribed Adderall 20 mg twice daily however she was seeing cardiology for palpitations and they recommended discontinuing the Adderall.  The patient compromised and decreased her Adderall to 20 mg once daily.  Without the medication she is unable to focus at work.  She is unable to complete assigned task in a timely manner.  She makes careless errors.  She does not believe that she will be able to function without the medication and continue doing her current job.  She is easily distracted.  She denies any anxiety due to the medication.  She denies any insomnia however the medication does tend to wear off too quickly.  She can definitely feel when the medicine wears off.  She is also taking metoprolol supposed to be on 100 mg twice daily for palpitations.  However she frequently forgets to take her evening dose and therefore is only taking 100 mg once a day.  She does notice palpitations particularly at worse at work and in the afternoons.  She also has a history of prediabetes.  Most recent hemoglobin A1c was 6.0.  She is going to a nutritionist.  They are currently working with her on dietary changes.  She is not exercising due to pain in her knee.  She has a history of diastolic dysfunction.  Blood pressure today is well controlled at 110/70. Past Medical History:  Diagnosis Date  . Anxiety   . Asthma   . Back pain   . Chest pain   . Chronic CHF ([place])   . Dyspnea   . Echocardiogram 10/25/2015   EF 60-65, normal wall motion, grade 1 diastolic dysfunction, GLS -19.1%  . Edema of left lower extremity   . Fracture dislocation of elbow joint 01/15/2013   right  . GERD (gastroesophageal reflux disease)    Omeprazole as needed, none in 6 mos.  .  Hyperlipidemia   . Hypertension   . Joint pain   . Obesity   . Palpitations   . Prediabetes   . R/L Cardiac catheterization 08/18/2018   Normal coronary arteries, mean RA 6, mean PA 17, PCWP 10  . Seborrheic dermatitis   . Sleep apnea    uses CPAP "most nights"   Past Surgical History:  Procedure Laterality Date  . [place]; 10/10/2000   x 2  . KNEE ARTHROSCOPY Left 1991   also in 2019  . LAPAROSCOPIC ASSISTED VAGINAL HYSTERECTOMY  11/02/2006  . LAPAROSCOPIC LYSIS OF ADHESIONS  03/06/2005  . RADIAL HEAD ARTHROPLASTY Right 01/17/2013   Procedure: RIGHT RADIAL HEAD ARTHROPLASTY OPEN REDUCTION INTERNAL FIXATION CORONOID ;  Surgeon: Tennis Must, MD;  Location: [facility];  Service: Orthopedics;  Laterality: Right;  . RIGHT/LEFT HEART CATH AND CORONARY ANGIOGRAPHY N/A 08/18/2018   Procedure: RIGHT/LEFT HEART CATH AND CORONARY ANGIOGRAPHY;  Surgeon: Nelva Bush, MD;  Location: [facility];  Service: Cardiovascular;  Laterality: N/A;  . TUBAL LIGATION  03/06/2005   Current Outpatient Medications on File Prior to Visit  Medication Sig Dispense Refill  . albuterol (PROVENTIL HFA;VENTOLIN HFA) 108 (90 Base) MCG/ACT inhaler Inhale 2 puffs into the lungs every 6 (six) hours as needed for wheezing. 1 Inhaler 11  .  ALPRAZolam (XANAX) 0.25 MG tablet Take 1 tablet (0.25 mg total) by mouth daily as needed for anxiety. 30 tablet 1  . amphetamine-dextroamphetamine (ADDERALL) 20 MG tablet Take 1 tablet (20 mg total) by mouth daily. 30 tablet 0  . furosemide (LASIX) 20 MG tablet Take 2 tablets (40 mg total) by mouth daily as needed (swelling). 30 tablet 2  . ibuprofen (ADVIL,MOTRIN) 200 MG tablet Take 400 mg by mouth 2 (two) times daily as needed for headache or moderate pain.    . metoprolol tartrate (LOPRESSOR) 100 MG tablet Take 1 tablet (100 mg total) by mouth 2 (two) times daily. 60 tablet 11  . nitroGLYCERIN (NITROSTAT) 0.4 MG SL tablet Place 1 tablet (0.4 mg  total) under the tongue every 5 (five) minutes as needed for chest pain. 25 tablet 3  . Vitamin D, Ergocalciferol, (DRISDOL) 1.25 MG (50000 UT) CAPS capsule Take 1 capsule (50,000 Units total) by mouth every 7 (seven) days. 4 capsule 0   No current facility-administered medications on file prior to visit.    Allergies  Allergen Reactions  . Adhesive [Tape] Rash  . Latex Rash   Social History   Socioeconomic History  . Marital status: Married    Spouse name: Fahmida Jurich  . Number of children: 2  . Years of education: Not on file  . Highest education level: Not on file  Occupational History  . Occupation: CMA    Employer: [employer]  . Financial resource strain: Not on file  . Food insecurity:    Worry: Not on file    Inability: Not on file  . Transportation needs:    Medical: Not on file    Non-medical: Not on file  Tobacco Use  . Smoking status: Never Smoker  . Smokeless tobacco: Never Used  Substance and Sexual Activity  . Alcohol use: No  . Drug use: No  . Sexual activity: Not on file  Lifestyle  . Physical activity:    Days per week: Not on file    Minutes per session: Not on file  . Stress: Not on file  Relationships  . Social connections:    Talks on phone: Not on file    Gets together: Not on file    Attends religious service: Not on file    Active member of club or organization: Not on file    Attends meetings of clubs or organizations: Not on file    Relationship status: Not on file  . Intimate partner violence:    Fear of current or ex partner: Not on file    Emotionally abused: Not on file    Physically abused: Not on file    Forced sexual activity: Not on file  Other Topics Concern  . Not on file  Social History Narrative  . Not on file     Review of Systems  All other systems reviewed and are negative.      Objective:   Physical Exam Vitals signs reviewed.  Constitutional:      Appearance: She is obese.    Cardiovascular:     Rate and Rhythm: Normal rate and regular rhythm.     Pulses: Normal pulses.     Heart sounds: Normal heart sounds. No murmur. No friction rub. No gallop.   Pulmonary:     Effort: No respiratory distress.     Breath sounds: Normal breath sounds. No stridor. No wheezing, rhonchi or rales.  Chest:     Chest wall:  No tenderness.  Abdominal:     General: Bowel sounds are normal. There is no distension.     Palpations: Abdomen is soft.     Tenderness: There is no abdominal tenderness. There is no right CVA tenderness, left CVA tenderness, guarding or rebound.  Musculoskeletal:     Right lower leg: No edema.     Left lower leg: No edema.  Neurological:     Mental Status: She is alert.      Physical exam is significant for mild tachycardia     Assessment & Plan:  Prediabetes  Essential hypertension  Attention deficit hyperactivity disorder (ADHD), predominantly inattentive type  I have recommended switching Adderall to extended release 20 mg once daily.  I believe that the immediate release Adderall likely exacerbates her palpitations and also has a shorter half-life and therefore does not last as long.  By switching to the equivalent dose but with an extended relief hopefully the palpitations will improve and she will see a better duration of treatment with less of a "crash".  Also explained to the patient that if she is only taking metoprolol once a day, for 12 hours, she is essentially on no medication and this is likely exacerbating her palpitations.  Therefore I recommended that she either take the medication twice a day as directed or discussed with her cardiologist switching to an extended release that would be easier for her to take.  Patient has prediabetes.  She is currently working with a nutritionist and a bariatric specialist to better manage her blood sugars.  I will defer to their judgment.  Her blood pressure today is well controlled.  No further changes  are necessary.  I did refill her Lasix which she uses sparingly for leg swelling

## 2019-01-25 ENCOUNTER — Other Ambulatory Visit: Payer: Self-pay | Admitting: Family Medicine

## 2019-01-25 DIAGNOSIS — F411 Generalized anxiety disorder: Secondary | ICD-10-CM

## 2019-01-25 NOTE — Telephone Encounter (Signed)
Pt is requesting refill on Xanax   LOV: 01/13/19  LRF: 10/01/2016

## 2019-01-26 MED ORDER — ALPRAZOLAM 0.25 MG PO TABS: 0.2500 mg | ORAL_TABLET | Freq: Every day | ORAL | 2 refills | Status: DC | PRN

## 2019-01-26 MED FILL — ALPRAZolam 0.25 MG TABS: 0.25 | 30 days supply | Qty: 30 | Fill #0

## 2019-01-26 NOTE — Addendum Note (Signed)
Addended by: Shary Decamp B on: 01/26/2019 10:42 AM   Modules accepted: Orders

## 2019-01-26 NOTE — Telephone Encounter (Signed)
Re-send

## 2019-02-01 ENCOUNTER — Encounter (INDEPENDENT_AMBULATORY_CARE_PROVIDER_SITE_OTHER): Payer: Self-pay | Admitting: Family Medicine

## 2019-02-01 ENCOUNTER — Ambulatory Visit (INDEPENDENT_AMBULATORY_CARE_PROVIDER_SITE_OTHER): Payer: 59 | Admitting: Family Medicine

## 2019-02-01 ENCOUNTER — Other Ambulatory Visit: Payer: Self-pay

## 2019-02-01 ENCOUNTER — Encounter (INDEPENDENT_AMBULATORY_CARE_PROVIDER_SITE_OTHER): Payer: Self-pay

## 2019-02-01 VITALS — BP 117/74 | HR 76 | Ht 63.0 in | Wt 219.0 lb

## 2019-02-01 DIAGNOSIS — Z6838 Body mass index (BMI) 38.0-38.9, adult: Secondary | ICD-10-CM

## 2019-02-01 DIAGNOSIS — E559 Vitamin D deficiency, unspecified: Secondary | ICD-10-CM

## 2019-02-01 DIAGNOSIS — Z711 Person with feared health complaint in whom no diagnosis is made: Secondary | ICD-10-CM

## 2019-02-01 DIAGNOSIS — Z9189 Other specified personal risk factors, not elsewhere classified: Secondary | ICD-10-CM

## 2019-02-01 MED ORDER — VITAMIN D (ERGOCALCIFEROL) 1.25 MG (50000 UNIT) PO CAPS: 50000.0000 [IU] | ORAL_CAPSULE | ORAL | 0 refills | Status: DC

## 2019-02-01 NOTE — Progress Notes (Signed)
Office: [phone]  /  Fax: [phone]   HPI:   Chief Complaint: OBESITY [name] is here to discuss her progress with her obesity treatment plan. She is on the Category 3 plan and is following her eating plan approximately 90 to 95 % of the time. She states she is exercising 0 minutes 0 times per week. [name] continues to do well with weight loss on her Category 3 plan. Her hunger is controlled and she is doing well with meal planning.  Her weight is 219 lb (99.3 kg) today and has had a weight loss of 5 pounds over a period of 3 weeks since her last visit. She has lost 16 lbs since starting treatment with [name].  Vitamin D Deficiency [name] has a diagnosis of vitamin D deficiency. She is currently stable on vit D. [name] denies nausea, vomiting, or muscle weakness.  At risk for osteopenia and osteoporosis [name] is at higher risk of osteopenia and osteoporosis due to vitamin D deficiency.   Worried Well [name] works in [employer] and is at higher risk of infection. She has questions about social distancing.  ASSESSMENT AND PLAN:  Vitamin D deficiency - Plan: Vitamin D, Ergocalciferol, (DRISDOL) 1.25 MG (50000 UT) CAPS capsule  Worried well  At risk for osteoporosis  Class 2 severe obesity with serious comorbidity and body mass index (BMI) of 38.0 to 38.9 in adult, unspecified obesity type ([place])  PLAN:  Vitamin D Deficiency [name] was informed that low vitamin D levels contribute to fatigue and are associated with obesity, breast, and colon cancer. [name] agrees to continue to take prescription Vit D @50 ,000 IU every week #4 with no refills and will follow up for routine testing of vitamin D, at least 2-3 times per year. She was informed of the risk of over-replacement of vitamin D and agrees to not increase her dose unless she discusses this with [name] first. [name] agrees to follow up in 2 to 3 weeks as directed.  At risk for osteopenia and osteoporosis March was given  extended (15 minutes) osteoporosis prevention counseling today. [name] is at risk for osteopenia and osteoporosis due to her vitamin D deficiency. She was encouraged to take her vitamin D and follow her higher calcium diet and increase strengthening exercise to help strengthen her bones and decrease her risk of osteopenia and osteoporosis.  Worried Well [name] was educated on the need for social distancing, no travel, and hand washing/hand sanitizing. We discussed symptoms of COVID19 and we discussed the possible need for sequestration and how to deal with this if it happens. Pt was offered guidance and reassurance.  Obesity [name] is currently in the action stage of change. As such, her goal is to continue with weight loss efforts. She has agreed to follow the Category 3 plan. [name] has been instructed to work up to a goal of 150 minutes of combined cardio and strengthening exercise per week for weight loss and overall health benefits. We discussed the following Behavioral Modification Strategies today: increasing lean protein intake, decreasing simple carbohydrates, work on meal planning and easy cooking plans, emotional eating strategies, ways to avoid boredom eating, keeping healthy foods in the home, better snacking choices, and ways to avoid night time snacking.  [name] has agreed to follow up with our clinic in 2 to 3 weeks. She was informed of the importance of frequent follow up visits to maximize her success with intensive lifestyle modifications for her multiple health conditions.  ALLERGIES: Allergies  Allergen Reactions  .  Adhesive [Tape] Rash  . Latex Rash    MEDICATIONS: Current Outpatient Medications on File Prior to Visit  Medication Sig Dispense Refill  . albuterol (PROVENTIL HFA;VENTOLIN HFA) 108 (90 Base) MCG/ACT inhaler Inhale 2 puffs into the lungs every 6 (six) hours as needed for wheezing. 1 Inhaler 11  . ALPRAZolam (XANAX) 0.25 MG tablet Take 1 tablet (0.25 mg  total) by mouth daily as needed for anxiety. 30 tablet 2  . amphetamine-dextroamphetamine (ADDERALL XR) 20 MG 24 hr capsule Take 1 capsule (20 mg total) by mouth every morning. 30 capsule 0  . furosemide (LASIX) 20 MG tablet Take 2 tablets (40 mg total) by mouth daily as needed (swelling). 30 tablet 2  . ibuprofen (ADVIL,MOTRIN) 200 MG tablet Take 400 mg by mouth 2 (two) times daily as needed for headache or moderate pain.    . metoprolol tartrate (LOPRESSOR) 100 MG tablet Take 1 tablet (100 mg total) by mouth 2 (two) times daily. 60 tablet 11  . nitroGLYCERIN (NITROSTAT) 0.4 MG SL tablet Place 1 tablet (0.4 mg total) under the tongue every 5 (five) minutes as needed for chest pain. 25 tablet 3  . Vitamin D, Ergocalciferol, (DRISDOL) 1.25 MG (50000 UT) CAPS capsule Take 1 capsule (50,000 Units total) by mouth every 7 (seven) days. 4 capsule 0   No current facility-administered medications on file prior to visit.     PAST MEDICAL HISTORY: Past Medical History:  Diagnosis Date  . Anxiety   . Asthma   . Back pain   . Chest pain   . Chronic CHF ([place])   . Dyspnea   . Echocardiogram 10/25/2015   EF 60-65, normal wall motion, grade 1 diastolic dysfunction, GLS -19.1%  . Edema of left lower extremity   . Fracture dislocation of elbow joint 01/15/2013   right  . GERD (gastroesophageal reflux disease)    Omeprazole as needed, none in 6 mos.  . Hyperlipidemia   . Hypertension   . Joint pain   . Obesity   . Palpitations   . Prediabetes   . R/L Cardiac catheterization 08/18/2018   Normal coronary arteries, mean RA 6, mean PA 17, PCWP 10  . Seborrheic dermatitis   . Sleep apnea    uses CPAP "most nights"    PAST SURGICAL HISTORY: Past Surgical History:  Procedure Laterality Date  . [place]; 10/10/2000   x 2  . KNEE ARTHROSCOPY Left 1991   also in 2019  . LAPAROSCOPIC ASSISTED VAGINAL HYSTERECTOMY  11/02/2006  . LAPAROSCOPIC LYSIS OF ADHESIONS  03/06/2005  . RADIAL HEAD  ARTHROPLASTY Right 01/17/2013   Procedure: RIGHT RADIAL HEAD ARTHROPLASTY OPEN REDUCTION INTERNAL FIXATION CORONOID ;  Surgeon: Tennis Must, MD;  Location: [facility];  Service: Orthopedics;  Laterality: Right;  . RIGHT/LEFT HEART CATH AND CORONARY ANGIOGRAPHY N/A 08/18/2018   Procedure: RIGHT/LEFT HEART CATH AND CORONARY ANGIOGRAPHY;  Surgeon: Nelva Bush, MD;  Location: [facility];  Service: Cardiovascular;  Laterality: N/A;  . TUBAL LIGATION  03/06/2005    SOCIAL HISTORY: Social History   Tobacco Use  . Smoking status: Never Smoker  . Smokeless tobacco: Never Used  Substance Use Topics  . Alcohol use: No  . Drug use: No    FAMILY HISTORY: Family History  Problem Relation Age of Onset  . Diabetes Sister   . Heart disease Father 26       MI--1st Dx CAD. Died2that time  . Sudden death Father   .  Diabetes Mother   . Hypertension Mother   . Heart disease Mother   . Obesity Mother   . Heart disease Maternal Uncle   . Ovarian cancer Maternal Grandmother   . Diabetes Maternal Grandmother   . Heart disease Maternal Grandmother        Event organiser  . Diabetes Paternal Grandmother   . Liver disease Paternal Aunt   . Heart disease Maternal Grandfather    ROS: Review of Systems  Constitutional: Positive for weight loss.  Gastrointestinal: Negative for nausea and vomiting.  Musculoskeletal:       Negative for muscle weakness.   PHYSICAL EXAM: Blood pressure 117/74, pulse 76, height 5\' 3"  (1.6 m), weight 219 lb (99.3 kg), SpO2 98 %. Body mass index is 38.79 kg/m. Physical Exam Vitals signs reviewed.  Constitutional:      Appearance: Normal appearance. She is obese.  Cardiovascular:     Rate and Rhythm: Normal rate.  Pulmonary:     Effort: Pulmonary effort is normal.  Musculoskeletal: Normal range of motion.  Skin:    General: Skin is warm and dry.  Neurological:     Mental Status: She is alert and oriented to person, place, and time.   Psychiatric:        Mood and Affect: Mood normal.        Behavior: Behavior normal.    RECENT LABS AND TESTS: BMET    Component Value Date/Time   NA 140 11/30/2018 1047   K 4.3 11/30/2018 1047   CL 101 11/30/2018 1047   CO2 23 11/30/2018 1047   GLUCOSE 92 11/30/2018 1047   GLUCOSE 97 12/24/2017 0818   BUN 12 11/30/2018 1047   CREATININE 0.57 11/30/2018 1047   CREATININE 0.63 12/24/2017 0818   CALCIUM 8.9 11/30/2018 1047   GFRNONAA 112 11/30/2018 1047   GFRNONAA 109 12/24/2017 0818   GFRAA 129 11/30/2018 1047   GFRAA 126 12/24/2017 0818   Lab Results  Component Value Date   HGBA1C 6.0 (H) 11/30/2018   Lab Results  Component Value Date   INSULIN 14.5 11/30/2018   CBC    Component Value Date/Time   WBC 9.8 11/30/2018 1047   WBC 9.6 12/24/2017 0818   RBC 4.51 11/30/2018 1047   RBC 5.06 12/24/2017 0818   HGB 13.6 11/30/2018 1047   HCT 39.1 11/30/2018 1047   PLT 303 11/30/2018 1047   MCV 87 11/30/2018 1047   MCH 30.2 11/30/2018 1047   MCH 29.2 12/24/2017 0818   MCHC 34.8 11/30/2018 1047   MCHC 33.4 12/24/2017 0818   RDW 12.9 11/30/2018 1047   LYMPHSABS 3.4 (H) 11/30/2018 1047   MONOABS 690 07/23/2016 1109   EOSABS 0.1 11/30/2018 1047   BASOSABS 0.0 11/30/2018 1047   Iron/TIBC/Ferritin/ %Sat No results found for: IRON, TIBC, FERRITIN, IRONPCTSAT Lipid Panel     Component Value Date/Time   CHOL 189 11/30/2018 1047   TRIG 161 (H) 11/30/2018 1047   HDL 43 11/30/2018 1047   CHOLHDL 4.4 11/30/2018 1047   CHOLHDL 4.2 12/24/2017 0818   VLDL 22 03/11/2015 0847   LDLCALC 114 (H) 11/30/2018 1047   LDLCALC 134 (H) 12/24/2017 0818   Hepatic Function Panel     Component Value Date/Time   PROT 6.8 11/30/2018 1047   ALBUMIN 4.2 11/30/2018 1047   AST 13 11/30/2018 1047   ALT 15 11/30/2018 1047   ALKPHOS 82 11/30/2018 1047   BILITOT 0.2 11/30/2018 1047      Component Value Date/Time  TSH 4.280 11/30/2018 1047   TSH 3.21 12/24/2017 0818   TSH 3.24 10/01/2016  1643   Results for TEKOA, HAMOR (MRN [id number]) as of 02/01/2019 12:40  Ref. Range 11/30/2018 10:47  Vitamin D, 25-Hydroxy Latest Ref Range: 30.0 - 100.0 ng/mL 7.8 (L)   OBESITY BEHAVIORAL INTERVENTION VISIT  Today's visit was # 5   Starting weight: 235 lbs Starting date: 11/30/18 Today's weight : Weight: 219 lb (99.3 kg)  Today's date: 02/01/2019 Total lbs lost to date: 16    02/01/2019  Height 5\' 3"  (1.6 m)  Weight 219 lb (99.3 kg)  BMI (Calculated) 38.8  BLOOD PRESSURE - SYSTOLIC 812  BLOOD PRESSURE - DIASTOLIC 74   Body Fat % 75.1 %  Total Body Water (lbs) 88.8 lbs   ASK: We discussed the diagnosis of obesity with Maureen Chatters today and Jovie agreed to give [name] permission to discuss obesity behavioral modification therapy today.  ASSESS: Brailey has the diagnosis of obesity and her BMI today is 38.8. Tomekia is in the action stage of change.   ADVISE: Jolynn was educated on the multiple health risks of obesity as well as the benefit of weight loss to improve her health. She was advised of the need for long term treatment and the importance of lifestyle modifications to improve her current health and to decrease her risk of future health problems.  AGREE: Multiple dietary modification options and treatment options were discussed and Tiegan agreed to follow the recommendations documented in the above note.  ARRANGE: Rhandi was educated on the importance of frequent visits to treat obesity as outlined per CMS and USPSTF guidelines and agreed to schedule her next follow up appointment today.  IMarcille Blanco, CMA, am acting as transcriptionist for Starlyn Skeans, MD  I have reviewed the above documentation for accuracy and completeness, and I agree with the above. -Dennard Nip, MD

## 2019-02-02 MED FILL — VIT D2 1.25 MG (50,000 UNIT: 1.25 MG | 28 days supply | Qty: 4 | Fill #0

## 2019-02-06 DIAGNOSIS — L218 Other seborrheic dermatitis: Secondary | ICD-10-CM | POA: Diagnosis not present

## 2019-02-07 ENCOUNTER — Encounter (INDEPENDENT_AMBULATORY_CARE_PROVIDER_SITE_OTHER): Payer: Self-pay

## 2019-02-07 MED FILL — FLUOCINONIDE 0.05 % SOLN: 0.05 | 14 days supply | Qty: 60 | Fill #0

## 2019-02-22 ENCOUNTER — Ambulatory Visit (INDEPENDENT_AMBULATORY_CARE_PROVIDER_SITE_OTHER): Payer: 59 | Admitting: Family Medicine

## 2019-02-22 ENCOUNTER — Encounter (INDEPENDENT_AMBULATORY_CARE_PROVIDER_SITE_OTHER): Payer: Self-pay | Admitting: Family Medicine

## 2019-02-22 ENCOUNTER — Other Ambulatory Visit: Payer: Self-pay

## 2019-02-22 DIAGNOSIS — Z6838 Body mass index (BMI) 38.0-38.9, adult: Secondary | ICD-10-CM

## 2019-02-22 DIAGNOSIS — E559 Vitamin D deficiency, unspecified: Secondary | ICD-10-CM

## 2019-02-22 MED ORDER — VITAMIN D (ERGOCALCIFEROL) 1.25 MG (50000 UNIT) PO CAPS: 50000.0000 [IU] | ORAL_CAPSULE | ORAL | 0 refills | Status: DC

## 2019-02-22 MED FILL — VIT D2 1.25 MG (50,000 UNIT: 1.25 MG | 28 days supply | Qty: 4 | Fill #0

## 2019-02-22 NOTE — Progress Notes (Signed)
Office: [phone]  /  Fax: [phone] TeleHealth Visit:  [name] has verbally consented to this TeleHealth visit today. The patient is located at work, the provider is located at the [employer] [facility] office. The participants in this visit include the listed provider and patient. The visit was conducted today via Webex.  HPI:   Chief Complaint: OBESITY [name] is here to discuss her progress with her obesity treatment plan. She is on the Category 3 plan and is following her eating plan approximately 80 % of the time. She states she is walking 25 to 30 minutes 2 to 3 times per week. [name] feels that she is doing well with her Category 3 plan and has lost about 1 to 2 pounds. She is doing more portion control and smarter choices, but may not be eating all of her protein or vegetables.  We were unable to weigh the patient today for this TeleHealth visit. She feels as if she has lost weight since her last visit. She has lost 16 lbs since starting treatment with [name].  Vitamin D Deficiency [name] has a diagnosis of vitamin D deficiency. She is currently stable on vit D, but is not yet at goal. [name] denies nausea, vomiting, or muscle weakness.  ASSESSMENT AND PLAN:  Vitamin D deficiency - Plan: Vitamin D, Ergocalciferol, (DRISDOL) 1.25 MG (50000 UT) CAPS capsule  Class 2 severe obesity with serious comorbidity and body mass index (BMI) of 38.0 to 38.9 in adult, unspecified obesity type ([name])  PLAN:  Vitamin D Deficiency [name] was informed that low vitamin D levels contribute to fatigue and are associated with obesity, breast, and colon cancer. Lynora agrees to continue to take prescription Vit D @50 ,000 IU every week #4 with no refills and will follow up for routine testing of vitamin D, at least 2-3 times per year. She was informed of the risk of over-replacement of vitamin D and agrees to not increase her dose unless she discusses this with [name] first. [name]  agrees to follow up in 3 weeks as directed.  Obesity [name] is currently in the action stage of change. As such, her goal is to continue with weight loss efforts. She has agreed to follow the Category 3 plan. [name] has been instructed to work up to a goal of 150 minutes of combined cardio and strengthening exercise per week for weight loss and overall health benefits. We discussed the following Behavioral Modification Strategies today: increasing lean protein intake, increasing vegetables, keeping healthy foods in the home, better snacking choices, and work on meal planning and easy cooking plans.  [name] has agreed to follow up with our clinic in 3 weeks. She was informed of the importance of frequent follow up visits to maximize her success with intensive lifestyle modifications for her multiple health conditions.  ALLERGIES: Allergies  Allergen Reactions  . Adhesive [Tape] Rash  . Latex Rash    MEDICATIONS: Current Outpatient Medications on File Prior to Visit  Medication Sig Dispense Refill  . albuterol (PROVENTIL HFA;VENTOLIN HFA) 108 (90 Base) MCG/ACT inhaler Inhale 2 puffs into the lungs every 6 (six) hours as needed for wheezing. 1 Inhaler 11  . ALPRAZolam (XANAX) 0.25 MG tablet Take 1 tablet (0.25 mg total) by mouth daily as needed for anxiety. 30 tablet 2  . amphetamine-dextroamphetamine (ADDERALL XR) 20 MG 24 hr capsule Take 1 capsule (20 mg total) by mouth every morning. 30 capsule 0  . furosemide (LASIX) 20 MG tablet Take 2 tablets (40  mg total) by mouth daily as needed (swelling). 30 tablet 2  . ibuprofen (ADVIL,MOTRIN) 200 MG tablet Take 400 mg by mouth 2 (two) times daily as needed for headache or moderate pain.    . metoprolol tartrate (LOPRESSOR) 100 MG tablet Take 1 tablet (100 mg total) by mouth 2 (two) times daily. 60 tablet 11  . nitroGLYCERIN (NITROSTAT) 0.4 MG SL tablet Place 1 tablet (0.4 mg total) under the tongue every 5 (five) minutes as needed for chest  pain. 25 tablet 3   No current facility-administered medications on file prior to visit.     PAST MEDICAL HISTORY: Past Medical History:  Diagnosis Date  . Anxiety   . Asthma   . Back pain   . Chest pain   . Chronic CHF ([place])   . Dyspnea   . Echocardiogram 10/25/2015   EF 60-65, normal wall motion, grade 1 diastolic dysfunction, GLS -19.1%  . Edema of left lower extremity   . Fracture dislocation of elbow joint 01/15/2013   right  . GERD (gastroesophageal reflux disease)    Omeprazole as needed, none in 6 mos.  . Hyperlipidemia   . Hypertension   . Joint pain   . Obesity   . Palpitations   . Prediabetes   . R/L Cardiac catheterization 08/18/2018   Normal coronary arteries, mean RA 6, mean PA 17, PCWP 10  . Seborrheic dermatitis   . Sleep apnea    uses CPAP "most nights"    PAST SURGICAL HISTORY: Past Surgical History:  Procedure Laterality Date  . [place]; 10/10/2000   x 2  . KNEE ARTHROSCOPY Left 1991   also in 2019  . LAPAROSCOPIC ASSISTED VAGINAL HYSTERECTOMY  11/02/2006  . LAPAROSCOPIC LYSIS OF ADHESIONS  03/06/2005  . RADIAL HEAD ARTHROPLASTY Right 01/17/2013   Procedure: RIGHT RADIAL HEAD ARTHROPLASTY OPEN REDUCTION INTERNAL FIXATION CORONOID ;  Surgeon: Tennis Must, MD;  Location: [facility];  Service: Orthopedics;  Laterality: Right;  . RIGHT/LEFT HEART CATH AND CORONARY ANGIOGRAPHY N/A 08/18/2018   Procedure: RIGHT/LEFT HEART CATH AND CORONARY ANGIOGRAPHY;  Surgeon: Nelva Bush, MD;  Location: [facility];  Service: Cardiovascular;  Laterality: N/A;  . TUBAL LIGATION  03/06/2005    SOCIAL HISTORY: Social History   Tobacco Use  . Smoking status: Never Smoker  . Smokeless tobacco: Never Used  Substance Use Topics  . Alcohol use: No  . Drug use: No    FAMILY HISTORY: Family History  Problem Relation Age of Onset  . Diabetes Sister   . Heart disease Father 74       MI--1st Dx CAD. Died2that time  . Sudden  death Father   . Diabetes Mother   . Hypertension Mother   . Heart disease Mother   . Obesity Mother   . Heart disease Maternal Uncle   . Ovarian cancer Maternal Grandmother   . Diabetes Maternal Grandmother   . Heart disease Maternal Grandmother        Event organiser  . Diabetes Paternal Grandmother   . Liver disease Paternal Aunt   . Heart disease Maternal Grandfather     ROS: Review of Systems  Gastrointestinal: Negative for nausea and vomiting.  Musculoskeletal:       Negative for muscle weakness.    PHYSICAL EXAM: Pt in no acute distress  RECENT LABS AND TESTS: BMET    Component Value Date/Time   NA 140 11/30/2018 1047   K 4.3 11/30/2018 1047  CL 101 11/30/2018 1047   CO2 23 11/30/2018 1047   GLUCOSE 92 11/30/2018 1047   GLUCOSE 97 12/24/2017 0818   BUN 12 11/30/2018 1047   CREATININE 0.57 11/30/2018 1047   CREATININE 0.63 12/24/2017 0818   CALCIUM 8.9 11/30/2018 1047   GFRNONAA 112 11/30/2018 1047   GFRNONAA 109 12/24/2017 0818   GFRAA 129 11/30/2018 1047   GFRAA 126 12/24/2017 0818   Lab Results  Component Value Date   HGBA1C 6.0 (H) 11/30/2018   Lab Results  Component Value Date   INSULIN 14.5 11/30/2018   CBC    Component Value Date/Time   WBC 9.8 11/30/2018 1047   WBC 9.6 12/24/2017 0818   RBC 4.51 11/30/2018 1047   RBC 5.06 12/24/2017 0818   HGB 13.6 11/30/2018 1047   HCT 39.1 11/30/2018 1047   PLT 303 11/30/2018 1047   MCV 87 11/30/2018 1047   MCH 30.2 11/30/2018 1047   MCH 29.2 12/24/2017 0818   MCHC 34.8 11/30/2018 1047   MCHC 33.4 12/24/2017 0818   RDW 12.9 11/30/2018 1047   LYMPHSABS 3.4 (H) 11/30/2018 1047   MONOABS 690 07/23/2016 1109   EOSABS 0.1 11/30/2018 1047   BASOSABS 0.0 11/30/2018 1047   Iron/TIBC/Ferritin/ %Sat No results found for: IRON, TIBC, FERRITIN, IRONPCTSAT Lipid Panel     Component Value Date/Time   CHOL 189 11/30/2018 1047   TRIG 161 (H) 11/30/2018 1047   HDL 43 11/30/2018 1047   CHOLHDL 4.4  11/30/2018 1047   CHOLHDL 4.2 12/24/2017 0818   VLDL 22 03/11/2015 0847   LDLCALC 114 (H) 11/30/2018 1047   LDLCALC 134 (H) 12/24/2017 0818   Hepatic Function Panel     Component Value Date/Time   PROT 6.8 11/30/2018 1047   ALBUMIN 4.2 11/30/2018 1047   AST 13 11/30/2018 1047   ALT 15 11/30/2018 1047   ALKPHOS 82 11/30/2018 1047   BILITOT 0.2 11/30/2018 1047      Component Value Date/Time   TSH 4.280 11/30/2018 1047   TSH 3.21 12/24/2017 0818   TSH 3.24 10/01/2016 1643   Results for LEAHMARIE, GASIOROWSKI (MRN [id number]) as of 02/22/2019 11:02  Ref. Range 11/30/2018 10:47  Vitamin D, 25-Hydroxy Latest Ref Range: 30.0 - 100.0 ng/mL 7.8 (L)     I, Marcille Blanco, CMA, am acting as transcriptionist for Starlyn Skeans, MD I have reviewed the above documentation for accuracy and completeness, and I agree with the above. -Dennard Nip, MD

## 2019-02-28 ENCOUNTER — Other Ambulatory Visit: Payer: Self-pay | Admitting: Family Medicine

## 2019-02-28 MED ORDER — AMPHETAMINE-DEXTROAMPHET ER 20 MG PO CP24: 20.0000 mg | ORAL_CAPSULE | ORAL | 0 refills | Status: DC

## 2019-02-28 MED FILL — ADDERALL XR 20 MG CAP SA: 20 | 30 days supply | Qty: 30 | Fill #0

## 2019-02-28 NOTE — Telephone Encounter (Signed)
Pt requesting refill on Adderall      LOV: 01/13/19  LRF:   01/13/19

## 2019-03-14 ENCOUNTER — Other Ambulatory Visit: Payer: Self-pay

## 2019-03-14 ENCOUNTER — Encounter (INDEPENDENT_AMBULATORY_CARE_PROVIDER_SITE_OTHER): Payer: Self-pay | Admitting: Family Medicine

## 2019-03-14 ENCOUNTER — Ambulatory Visit (INDEPENDENT_AMBULATORY_CARE_PROVIDER_SITE_OTHER): Payer: 59 | Admitting: Family Medicine

## 2019-03-14 DIAGNOSIS — E559 Vitamin D deficiency, unspecified: Secondary | ICD-10-CM

## 2019-03-14 DIAGNOSIS — Z6838 Body mass index (BMI) 38.0-38.9, adult: Secondary | ICD-10-CM

## 2019-03-14 MED ORDER — VITAMIN D (ERGOCALCIFEROL) 1.25 MG (50000 UNIT) PO CAPS: 50000.0000 [IU] | ORAL_CAPSULE | ORAL | 0 refills | Status: DC

## 2019-03-14 NOTE — Progress Notes (Signed)
Office: [phone]  /  Fax: [phone] TeleHealth Visit:  [name] has verbally consented to this TeleHealth visit today. The patient is located at home, the provider is located at the [employer] [facility] office. The participants in this visit include the listed provider and patient. Prerna was unable to use Webex today and the telehealth visit was conducted via telephone.  HPI:   Chief Complaint: OBESITY [name] is here to discuss her progress with her obesity treatment plan. She is on the Category 3 plan and is following her eating plan approximately 90 % of the time. She states she is walking 1 mile 20 to 25 minutes 3 times per week. Shanikia feels that she has done well maintaining her weight. She is still eating most of her Category 3 plan, but she has increased snacking and temptations from the family.  We were unable to weigh the patient today for this TeleHealth visit. She feels as if she has maintained weight since her last visit. She has lost 16 lbs since starting treatment with [name].  Vitamin D Deficiency [name] has a diagnosis of vitamin D deficiency. She is currently stable on vit D, but is not yet at goal. [name] notes that her fatigue is improving and she denies nausea, vomiting, or muscle weakness.  ASSESSMENT AND PLAN:  Vitamin D deficiency - Plan: Vitamin D, Ergocalciferol, (DRISDOL) 1.25 MG (50000 UT) CAPS capsule  Class 2 severe obesity with serious comorbidity and body mass index (BMI) of 38.0 to 38.9 in adult, unspecified obesity type ([place])  PLAN:  Vitamin D Deficiency [name] was informed that low vitamin D levels contribute to fatigue and are associated with obesity, breast, and colon cancer. Karlina agrees to continue to take prescription Vit D @50 ,000 IU every week #4 with no refills and will follow up for routine testing of vitamin D, at least 2-3 times per year. She was informed of the risk of over-replacement of vitamin D and agrees to not  increase her dose unless she discusses this with [name] first. [name] agrees to follow up in 2 weeks as directed.  Obesity [name] is currently in the action stage of change. As such, her goal is to continue with weight loss efforts. She has agreed to follow the Category 3 plan. [name] has been instructed to work up to a goal of 150 minutes of combined cardio and strengthening exercise per week for weight loss and overall health benefits. We discussed the following Behavioral Modification Strategies today: increasing lean protein intake, increasing vegetables, dealing with family or coworker sabotage, emotional eating strategies, no skipping meals, and ways to avoid boredom eating.  Jin has agreed to follow up with our clinic in 2 weeks. She was informed of the importance of frequent follow up visits to maximize her success with intensive lifestyle modifications for her multiple health conditions.  ALLERGIES: Allergies  Allergen Reactions  . Adhesive [Tape] Rash  . Latex Rash    MEDICATIONS: Current Outpatient Medications on File Prior to Visit  Medication Sig Dispense Refill  . albuterol (PROVENTIL HFA;VENTOLIN HFA) 108 (90 Base) MCG/ACT inhaler Inhale 2 puffs into the lungs every 6 (six) hours as needed for wheezing. 1 Inhaler 11  . ALPRAZolam (XANAX) 0.25 MG tablet Take 1 tablet (0.25 mg total) by mouth daily as needed for anxiety. 30 tablet 2  . amphetamine-dextroamphetamine (ADDERALL XR) 20 MG 24 hr capsule Take 1 capsule (20 mg total) by mouth every morning. 30 capsule 0  . furosemide (LASIX) 20  MG tablet Take 2 tablets (40 mg total) by mouth daily as needed (swelling). 30 tablet 2  . ibuprofen (ADVIL,MOTRIN) 200 MG tablet Take 400 mg by mouth 2 (two) times daily as needed for headache or moderate pain.    . metoprolol tartrate (LOPRESSOR) 100 MG tablet Take 1 tablet (100 mg total) by mouth 2 (two) times daily. 60 tablet 11  . nitroGLYCERIN (NITROSTAT) 0.4 MG SL tablet Place 1  tablet (0.4 mg total) under the tongue every 5 (five) minutes as needed for chest pain. 25 tablet 3  . Vitamin D, Ergocalciferol, (DRISDOL) 1.25 MG (50000 UT) CAPS capsule Take 1 capsule (50,000 Units total) by mouth every 7 (seven) days. 4 capsule 0   No current facility-administered medications on file prior to visit.     PAST MEDICAL HISTORY: Past Medical History:  Diagnosis Date  . Anxiety   . Asthma   . Back pain   . Chest pain   . Chronic CHF ([place])   . Dyspnea   . Echocardiogram 10/25/2015   EF 60-65, normal wall motion, grade 1 diastolic dysfunction, GLS -19.1%  . Edema of left lower extremity   . Fracture dislocation of elbow joint 01/15/2013   right  . GERD (gastroesophageal reflux disease)    Omeprazole as needed, none in 6 mos.  . Hyperlipidemia   . Hypertension   . Joint pain   . Obesity   . Palpitations   . Prediabetes   . R/L Cardiac catheterization 08/18/2018   Normal coronary arteries, mean RA 6, mean PA 17, PCWP 10  . Seborrheic dermatitis   . Sleep apnea    uses CPAP "most nights"    PAST SURGICAL HISTORY: Past Surgical History:  Procedure Laterality Date  . [place]; 10/10/2000   x 2  . KNEE ARTHROSCOPY Left 1991   also in 2019  . LAPAROSCOPIC ASSISTED VAGINAL HYSTERECTOMY  11/02/2006  . LAPAROSCOPIC LYSIS OF ADHESIONS  03/06/2005  . RADIAL HEAD ARTHROPLASTY Right 01/17/2013   Procedure: RIGHT RADIAL HEAD ARTHROPLASTY OPEN REDUCTION INTERNAL FIXATION CORONOID ;  Surgeon: Tennis Must, MD;  Location: [facility];  Service: Orthopedics;  Laterality: Right;  . RIGHT/LEFT HEART CATH AND CORONARY ANGIOGRAPHY N/A 08/18/2018   Procedure: RIGHT/LEFT HEART CATH AND CORONARY ANGIOGRAPHY;  Surgeon: Nelva Bush, MD;  Location: [facility];  Service: Cardiovascular;  Laterality: N/A;  . TUBAL LIGATION  03/06/2005    SOCIAL HISTORY: Social History   Tobacco Use  . Smoking status: Never Smoker  . Smokeless tobacco: Never  Used  Substance Use Topics  . Alcohol use: No  . Drug use: No    FAMILY HISTORY: Family History  Problem Relation Age of Onset  . Diabetes Sister   . Heart disease Father 82       MI--1st Dx CAD. Died2that time  . Sudden death Father   . Diabetes Mother   . Hypertension Mother   . Heart disease Mother   . Obesity Mother   . Heart disease Maternal Uncle   . Ovarian cancer Maternal Grandmother   . Diabetes Maternal Grandmother   . Heart disease Maternal Grandmother        Event organiser  . Diabetes Paternal Grandmother   . Liver disease Paternal Aunt   . Heart disease Maternal Grandfather     ROS: Review of Systems  Constitutional: Positive for malaise/fatigue.  Gastrointestinal: Negative for nausea and vomiting.  Musculoskeletal:       Negative  for muscle weakness.    PHYSICAL EXAM: Pt in no acute distress  RECENT LABS AND TESTS: BMET    Component Value Date/Time   NA 140 11/30/2018 1047   K 4.3 11/30/2018 1047   CL 101 11/30/2018 1047   CO2 23 11/30/2018 1047   GLUCOSE 92 11/30/2018 1047   GLUCOSE 97 12/24/2017 0818   BUN 12 11/30/2018 1047   CREATININE 0.57 11/30/2018 1047   CREATININE 0.63 12/24/2017 0818   CALCIUM 8.9 11/30/2018 1047   GFRNONAA 112 11/30/2018 1047   GFRNONAA 109 12/24/2017 0818   GFRAA 129 11/30/2018 1047   GFRAA 126 12/24/2017 0818   Lab Results  Component Value Date   HGBA1C 6.0 (H) 11/30/2018   Lab Results  Component Value Date   INSULIN 14.5 11/30/2018   CBC    Component Value Date/Time   WBC 9.8 11/30/2018 1047   WBC 9.6 12/24/2017 0818   RBC 4.51 11/30/2018 1047   RBC 5.06 12/24/2017 0818   HGB 13.6 11/30/2018 1047   HCT 39.1 11/30/2018 1047   PLT 303 11/30/2018 1047   MCV 87 11/30/2018 1047   MCH 30.2 11/30/2018 1047   MCH 29.2 12/24/2017 0818   MCHC 34.8 11/30/2018 1047   MCHC 33.4 12/24/2017 0818   RDW 12.9 11/30/2018 1047   LYMPHSABS 3.4 (H) 11/30/2018 1047   MONOABS 690 07/23/2016 1109   EOSABS 0.1  11/30/2018 1047   BASOSABS 0.0 11/30/2018 1047   Iron/TIBC/Ferritin/ %Sat No results found for: IRON, TIBC, FERRITIN, IRONPCTSAT Lipid Panel     Component Value Date/Time   CHOL 189 11/30/2018 1047   TRIG 161 (H) 11/30/2018 1047   HDL 43 11/30/2018 1047   CHOLHDL 4.4 11/30/2018 1047   CHOLHDL 4.2 12/24/2017 0818   VLDL 22 03/11/2015 0847   LDLCALC 114 (H) 11/30/2018 1047   LDLCALC 134 (H) 12/24/2017 0818   Hepatic Function Panel     Component Value Date/Time   PROT 6.8 11/30/2018 1047   ALBUMIN 4.2 11/30/2018 1047   AST 13 11/30/2018 1047   ALT 15 11/30/2018 1047   ALKPHOS 82 11/30/2018 1047   BILITOT 0.2 11/30/2018 1047      Component Value Date/Time   TSH 4.280 11/30/2018 1047   TSH 3.21 12/24/2017 0818   TSH 3.24 10/01/2016 1643    Results for ROSEMARIA, INABINET (MRN [id number]) as of 03/14/2019 15:45  Ref. Range 11/30/2018 10:47  Vitamin D, 25-Hydroxy Latest Ref Range: 30.0 - 100.0 ng/mL 7.8 (L)    I, Marcille Blanco, CMA, am acting as transcriptionist for Starlyn Skeans, MD I have reviewed the above documentation for accuracy and completeness, and I agree with the above. -Dennard Nip, MD

## 2019-03-28 ENCOUNTER — Encounter (INDEPENDENT_AMBULATORY_CARE_PROVIDER_SITE_OTHER): Payer: Self-pay | Admitting: Family Medicine

## 2019-03-28 ENCOUNTER — Ambulatory Visit (INDEPENDENT_AMBULATORY_CARE_PROVIDER_SITE_OTHER): Payer: 59 | Admitting: Family Medicine

## 2019-03-28 ENCOUNTER — Other Ambulatory Visit: Payer: Self-pay

## 2019-03-28 DIAGNOSIS — K5909 Other constipation: Secondary | ICD-10-CM

## 2019-03-28 DIAGNOSIS — Z6838 Body mass index (BMI) 38.0-38.9, adult: Secondary | ICD-10-CM | POA: Diagnosis not present

## 2019-03-28 DIAGNOSIS — R7303 Prediabetes: Secondary | ICD-10-CM | POA: Diagnosis not present

## 2019-03-28 MED ORDER — METFORMIN HCL 500 MG PO TABS: 500.0000 mg | ORAL_TABLET | Freq: Every day | ORAL | 0 refills | Status: DC

## 2019-03-28 MED FILL — metFORMIN HCL 500 MG TABS: 500 | 30 days supply | Qty: 30 | Fill #0

## 2019-03-29 NOTE — Progress Notes (Signed)
Office: [phone]  /  Fax: [phone] TeleHealth Visit:  [name] has verbally consented to this TeleHealth visit today. The patient is located at home, the provider is located at the [employer] [facility] office. The participants in this visit include the listed provider and patient. The visit was conducted today via Webex.  HPI:   Chief Complaint: OBESITY [name] is here to discuss her progress with her obesity treatment plan. She is on the Category 3 plan and is following her eating plan approximately 90 to 95 % of the time. She states she is walking 1 mile for 25 minutes 4 to 5 times per week. Cherry states that she continues to do well maintaining her weight since her last visit. Her hunger is starting to be a problem though. She is walking most days of the week for exercise.  We were unable to weigh the patient today for this TeleHealth visit. She feels as if she has gained weight since her last visit. She has lost 16 lbs since starting treatment with [name].  Pre-Diabetes [name] has a diagnosis of pre-diabetes based on her elevated Hgb A1c and was informed this puts her at greater risk of developing diabetes. She is struggling to follow her diet prescription due to increased polyphagia, which is worse in the afternoon, and for the last month. [name] is not taking metformin currently and continues to work on diet and exercise to decrease risk of diabetes. She denies hypoglycemia.   Constipation [name] notes that BM are less frequent in the last 2 weeks. She had 1 episode of intense straining. She has taken Miralax in the past, but not recently.  ASSESSMENT AND PLAN:  Prediabetes - Plan: metFORMIN (GLUCOPHAGE) 500 MG tablet  Other constipation  Class 2 severe obesity with serious comorbidity and body mass index (BMI) of 38.0 to 38.9 in adult, unspecified obesity type [facility])  PLAN:  Pre-Diabetes [name] will continue to work on weight loss, exercise, and  decreasing simple carbohydrates in her diet to help decrease the risk of diabetes. She was informed that eating too many simple carbohydrates or too many calories at one sitting increases the likelihood of GI side effects. [name] agreed to start metformin 500 mg qAM #30 with no refills and a prescription was written today. [name] agreed to follow up with [name] as directed to monitor her progress in 2 weeks.   Constipation [name] was informed that decreased bowel movement frequency is normal while losing weight, but stools should not be hard or painful. She was advised to increase her H20 intake and work on increasing her fiber intake. High fiber foods were discussed today. [name] agrees to restart OTC Miralax 17 gm/day and she will follow up at the agreed upon time.  Obesity [name] is currently in the action stage of change. As such, her goal is to continue with weight loss efforts. She has agreed to follow the Category 3 plan. Zulema has been instructed to work up to a goal of 150 minutes of combined cardio and strengthening exercise per week for weight loss and overall health benefits. We discussed the following Behavioral Modification Strategies today: increasing vegetables, increasing fiber rich foods, increase H2O intake, and work on meal planning and easy cooking plans.  [name] has agreed to follow up with our clinic in 2 weeks. She was informed of the importance of frequent follow up visits to maximize her success with intensive lifestyle modifications for her multiple health conditions.  ALLERGIES: Allergies  Allergen Reactions  .  Adhesive [Tape] Rash  . Latex Rash    MEDICATIONS: Current Outpatient Medications on File Prior to Visit  Medication Sig Dispense Refill  . albuterol (PROVENTIL HFA;VENTOLIN HFA) 108 (90 Base) MCG/ACT inhaler Inhale 2 puffs into the lungs every 6 (six) hours as needed for wheezing. 1 Inhaler 11  . ALPRAZolam (XANAX) 0.25 MG tablet Take 1 tablet (0.25 mg  total) by mouth daily as needed for anxiety. 30 tablet 2  . amphetamine-dextroamphetamine (ADDERALL XR) 20 MG 24 hr capsule Take 1 capsule (20 mg total) by mouth every morning. 30 capsule 0  . furosemide (LASIX) 20 MG tablet Take 2 tablets (40 mg total) by mouth daily as needed (swelling). 30 tablet 2  . ibuprofen (ADVIL,MOTRIN) 200 MG tablet Take 400 mg by mouth 2 (two) times daily as needed for headache or moderate pain.    . metoprolol tartrate (LOPRESSOR) 100 MG tablet Take 1 tablet (100 mg total) by mouth 2 (two) times daily. 60 tablet 11  . nitroGLYCERIN (NITROSTAT) 0.4 MG SL tablet Place 1 tablet (0.4 mg total) under the tongue every 5 (five) minutes as needed for chest pain. 25 tablet 3  . Vitamin D, Ergocalciferol, (DRISDOL) 1.25 MG (50000 UT) CAPS capsule Take 1 capsule (50,000 Units total) by mouth every 7 (seven) days. 4 capsule 0   No current facility-administered medications on file prior to visit.     PAST MEDICAL HISTORY: Past Medical History:  Diagnosis Date  . Anxiety   . Asthma   . Back pain   . Chest pain   . Chronic CHF ([place])   . Dyspnea   . Echocardiogram 10/25/2015   EF 60-65, normal wall motion, grade 1 diastolic dysfunction, GLS -19.1%  . Edema of left lower extremity   . Fracture dislocation of elbow joint 01/15/2013   right  . GERD (gastroesophageal reflux disease)    Omeprazole as needed, none in 6 mos.  . Hyperlipidemia   . Hypertension   . Joint pain   . Obesity   . Palpitations   . Prediabetes   . R/L Cardiac catheterization 08/18/2018   Normal coronary arteries, mean RA 6, mean PA 17, PCWP 10  . Seborrheic dermatitis   . Sleep apnea    uses CPAP "most nights"    PAST SURGICAL HISTORY: Past Surgical History:  Procedure Laterality Date  . [place]; 10/10/2000   x 2  . KNEE ARTHROSCOPY Left 1991   also in 2019  . LAPAROSCOPIC ASSISTED VAGINAL HYSTERECTOMY  11/02/2006  . LAPAROSCOPIC LYSIS OF ADHESIONS  03/06/2005  . RADIAL HEAD  ARTHROPLASTY Right 01/17/2013   Procedure: RIGHT RADIAL HEAD ARTHROPLASTY OPEN REDUCTION INTERNAL FIXATION CORONOID ;  Surgeon: Tennis Must, MD;  Location: [place];  Service: Orthopedics;  Laterality: Right;  . RIGHT/LEFT HEART CATH AND CORONARY ANGIOGRAPHY N/A 08/18/2018   Procedure: RIGHT/LEFT HEART CATH AND CORONARY ANGIOGRAPHY;  Surgeon: Nelva Bush, MD;  Location: [place] [facility];  Service: Cardiovascular;  Laterality: N/A;  . TUBAL LIGATION  03/06/2005    SOCIAL HISTORY: Social History   Tobacco Use  . Smoking status: Never Smoker  . Smokeless tobacco: Never Used  Substance Use Topics  . Alcohol use: No  . Drug use: No    FAMILY HISTORY: Family History  Problem Relation Age of Onset  . Diabetes Sister   . Heart disease Father 26       MI--1st Dx CAD. Died2that time  . Sudden death Father   .  Diabetes Mother   . Hypertension Mother   . Heart disease Mother   . Obesity Mother   . Heart disease Maternal Uncle   . Ovarian cancer Maternal Grandmother   . Diabetes Maternal Grandmother   . Heart disease Maternal Grandmother        Event organiser  . Diabetes Paternal Grandmother   . Liver disease Paternal Aunt   . Heart disease Maternal Grandfather     ROS: Review of Systems  Gastrointestinal: Positive for constipation.  Endo/Heme/Allergies:       Negative for hypoglycemia.    PHYSICAL EXAM: Pt in no acute distress  RECENT LABS AND TESTS: BMET    Component Value Date/Time   NA 140 11/30/2018 1047   K 4.3 11/30/2018 1047   CL 101 11/30/2018 1047   CO2 23 11/30/2018 1047   GLUCOSE 92 11/30/2018 1047   GLUCOSE 97 12/24/2017 0818   BUN 12 11/30/2018 1047   CREATININE 0.57 11/30/2018 1047   CREATININE 0.63 12/24/2017 0818   CALCIUM 8.9 11/30/2018 1047   GFRNONAA 112 11/30/2018 1047   GFRNONAA 109 12/24/2017 0818   GFRAA 129 11/30/2018 1047   GFRAA 126 12/24/2017 0818   Lab Results  Component Value Date   HGBA1C 6.0 (H)  11/30/2018   Lab Results  Component Value Date   INSULIN 14.5 11/30/2018   CBC    Component Value Date/Time   WBC 9.8 11/30/2018 1047   WBC 9.6 12/24/2017 0818   RBC 4.51 11/30/2018 1047   RBC 5.06 12/24/2017 0818   HGB 13.6 11/30/2018 1047   HCT 39.1 11/30/2018 1047   PLT 303 11/30/2018 1047   MCV 87 11/30/2018 1047   MCH 30.2 11/30/2018 1047   MCH 29.2 12/24/2017 0818   MCHC 34.8 11/30/2018 1047   MCHC 33.4 12/24/2017 0818   RDW 12.9 11/30/2018 1047   LYMPHSABS 3.4 (H) 11/30/2018 1047   MONOABS 690 07/23/2016 1109   EOSABS 0.1 11/30/2018 1047   BASOSABS 0.0 11/30/2018 1047   Iron/TIBC/Ferritin/ %Sat No results found for: IRON, TIBC, FERRITIN, IRONPCTSAT Lipid Panel     Component Value Date/Time   CHOL 189 11/30/2018 1047   TRIG 161 (H) 11/30/2018 1047   HDL 43 11/30/2018 1047   CHOLHDL 4.4 11/30/2018 1047   CHOLHDL 4.2 12/24/2017 0818   VLDL 22 03/11/2015 0847   LDLCALC 114 (H) 11/30/2018 1047   LDLCALC 134 (H) 12/24/2017 0818   Hepatic Function Panel     Component Value Date/Time   PROT 6.8 11/30/2018 1047   ALBUMIN 4.2 11/30/2018 1047   AST 13 11/30/2018 1047   ALT 15 11/30/2018 1047   ALKPHOS 82 11/30/2018 1047   BILITOT 0.2 11/30/2018 1047      Component Value Date/Time   TSH 4.280 11/30/2018 1047   TSH 3.21 12/24/2017 0818   TSH 3.24 10/01/2016 1643    Results for NYESHIA, MYSLIWIEC (MRN [id number]) as of 03/29/2019 10:45  Ref. Range 11/30/2018 10:47  Vitamin D, 25-Hydroxy Latest Ref Range: 30.0 - 100.0 ng/mL 7.8 (L)    I, Marcille Blanco, CMA, am acting as transcriptionist for Starlyn Skeans, MD I have reviewed the above documentation for accuracy and completeness, and I agree with the above. -Dennard Nip, MD

## 2019-04-07 ENCOUNTER — Other Ambulatory Visit: Payer: Self-pay | Admitting: Family Medicine

## 2019-04-07 MED ORDER — AMPHETAMINE-DEXTROAMPHET ER 20 MG PO CP24: 20.0000 mg | ORAL_CAPSULE | ORAL | 0 refills | Status: DC

## 2019-04-07 MED FILL — ADDERALL XR 20 MG CAP SA: 20 | 30 days supply | Qty: 30 | Fill #0

## 2019-04-07 MED FILL — VIT D2 1.25 MG (50,000 UNIT: 1.25 MG | 28 days supply | Qty: 4 | Fill #0

## 2019-04-07 NOTE — Telephone Encounter (Signed)
Pt requesting refill on Adderall      LOV:03/28/19  LRF:   02/28/19

## 2019-04-12 ENCOUNTER — Encounter (INDEPENDENT_AMBULATORY_CARE_PROVIDER_SITE_OTHER): Payer: Self-pay | Admitting: Family Medicine

## 2019-04-12 ENCOUNTER — Ambulatory Visit (INDEPENDENT_AMBULATORY_CARE_PROVIDER_SITE_OTHER): Payer: 59 | Admitting: Family Medicine

## 2019-04-12 ENCOUNTER — Other Ambulatory Visit: Payer: Self-pay

## 2019-04-12 DIAGNOSIS — Z6838 Body mass index (BMI) 38.0-38.9, adult: Secondary | ICD-10-CM | POA: Diagnosis not present

## 2019-04-12 DIAGNOSIS — R7303 Prediabetes: Secondary | ICD-10-CM

## 2019-04-13 NOTE — Progress Notes (Signed)
Office: [phone]  /  Fax: [phone] TeleHealth Visit:  [name] has verbally consented to this TeleHealth visit today. The patient is located at work, the provider is located at the [employer] and [facility] office. The participants in this visit include the listed provider and patient and any and all parties involved. The visit was conducted today via telephone. [name] was unable to use realtime audiovisual technology today and the telehealth visit was conducted via telephone.  HPI:   Chief Complaint: OBESITY [name] is here to discuss her progress with her obesity treatment plan. She is on the Category 3 plan and is following her eating plan approximately 90 % of the time. She states she is exercising 0 minutes 0 times per week. [name] continues to do well with weight loss on her Category 3 plan. Hunger has improved overall, but she sometimes misses some meals, especially due to her work requirements. As a side note, two fellow employees were diagnosed with COVID-19 this week, so she may be quarantined. We were unable to weigh the patient today for this TeleHealth visit. She feels as if she has lost weight since her last visit. She has lost 18 lbs since starting treatment with [name].  Pre-Diabetes [name] has a diagnosis of prediabetes based on her elevated Hgb A1c and was informed this puts her at greater risk of developing diabetes. She is doing well on metformin. She had GI upset for the first two to three days, but it resolved. She is trying to follow her plan and she notes decreased polyphagia. [name] continues to work on diet and exercise to decrease risk of diabetes.   ASSESSMENT AND PLAN:  Prediabetes  Class 2 severe obesity with serious comorbidity and body mass index (BMI) of 38.0 to 38.9 in adult, unspecified obesity type [facility])  PLAN:  Pre-Diabetes [name] will continue to work on weight loss, exercise, and decreasing simple carbohydrates in her diet to help  decrease the risk of diabetes. We dicussed metformin including benefits and risks. She was informed that eating too many simple carbohydrates or too many calories at one sitting increases the likelihood of GI side effects. [name] will continue metformin for now and a prescription was not written today. [name] agreed to follow up with [name] as directed to monitor her progress.  I spent > than 50% of the 15 minute visit on counseling as documented in the note.  Obesity [name] is currently in the action stage of change. As such, her goal is to continue with weight loss efforts She has agreed to follow the Category 3 plan [name] has been instructed to work up to a goal of 150 minutes of combined cardio and strengthening exercise per week for weight loss and overall health benefits. We discussed the following Behavioral Modification Strategies today: no skipping meals, work on meal planning and easy cooking plans and decrease liquid calories  Sani has agreed to follow up with our clinic in 2 weeks. She was informed of the importance of frequent follow up visits to maximize her success with intensive lifestyle modifications for her multiple health conditions.  ALLERGIES: Allergies  Allergen Reactions  . Adhesive [Tape] Rash  . Latex Rash    MEDICATIONS: Current Outpatient Medications on File Prior to Visit  Medication Sig Dispense Refill  . albuterol (PROVENTIL HFA;VENTOLIN HFA) 108 (90 Base) MCG/ACT inhaler Inhale 2 puffs into the lungs every 6 (six) hours as needed for wheezing. 1 Inhaler 11  . ALPRAZolam (XANAX) 0.25 MG tablet Take  1 tablet (0.25 mg total) by mouth daily as needed for anxiety. 30 tablet 2  . amphetamine-dextroamphetamine (ADDERALL XR) 20 MG 24 hr capsule Take 1 capsule (20 mg total) by mouth every morning. 30 capsule 0  . furosemide (LASIX) 20 MG tablet Take 2 tablets (40 mg total) by mouth daily as needed (swelling). 30 tablet 2  . ibuprofen (ADVIL,MOTRIN) 200 MG tablet  Take 400 mg by mouth 2 (two) times daily as needed for headache or moderate pain.    . metFORMIN (GLUCOPHAGE) 500 MG tablet Take 1 tablet (500 mg total) by mouth daily with breakfast. 30 tablet 0  . metoprolol tartrate (LOPRESSOR) 100 MG tablet Take 1 tablet (100 mg total) by mouth 2 (two) times daily. 60 tablet 11  . nitroGLYCERIN (NITROSTAT) 0.4 MG SL tablet Place 1 tablet (0.4 mg total) under the tongue every 5 (five) minutes as needed for chest pain. 25 tablet 3  . Vitamin D, Ergocalciferol, (DRISDOL) 1.25 MG (50000 UT) CAPS capsule Take 1 capsule (50,000 Units total) by mouth every 7 (seven) days. 4 capsule 0   No current facility-administered medications on file prior to visit.     PAST MEDICAL HISTORY: Past Medical History:  Diagnosis Date  . Anxiety   . Asthma   . Back pain   . Chest pain   . Chronic CHF ([place])   . Dyspnea   . Echocardiogram 10/25/2015   EF 60-65, normal wall motion, grade 1 diastolic dysfunction, GLS -19.1%  . Edema of left lower extremity   . Fracture dislocation of elbow joint 01/15/2013   right  . GERD (gastroesophageal reflux disease)    Omeprazole as needed, none in 6 mos.  . Hyperlipidemia   . Hypertension   . Joint pain   . Obesity   . Palpitations   . Prediabetes   . R/L Cardiac catheterization 08/18/2018   Normal coronary arteries, mean RA 6, mean PA 17, PCWP 10  . Seborrheic dermatitis   . Sleep apnea    uses CPAP "most nights"    PAST SURGICAL HISTORY: Past Surgical History:  Procedure Laterality Date  . [place]; 10/10/2000   x 2  . KNEE ARTHROSCOPY Left 1991   also in 2019  . LAPAROSCOPIC ASSISTED VAGINAL HYSTERECTOMY  11/02/2006  . LAPAROSCOPIC LYSIS OF ADHESIONS  03/06/2005  . RADIAL HEAD ARTHROPLASTY Right 01/17/2013   Procedure: RIGHT RADIAL HEAD ARTHROPLASTY OPEN REDUCTION INTERNAL FIXATION CORONOID ;  Surgeon: Tennis Must, MD;  Location: [place];  Service: Orthopedics;  Laterality: Right;  .  RIGHT/LEFT HEART CATH AND CORONARY ANGIOGRAPHY N/A 08/18/2018   Procedure: RIGHT/LEFT HEART CATH AND CORONARY ANGIOGRAPHY;  Surgeon: Nelva Bush, MD;  Location: [place] [facility];  Service: Cardiovascular;  Laterality: N/A;  . TUBAL LIGATION  03/06/2005    SOCIAL HISTORY: Social History   Tobacco Use  . Smoking status: Never Smoker  . Smokeless tobacco: Never Used  Substance Use Topics  . Alcohol use: No  . Drug use: No    FAMILY HISTORY: Family History  Problem Relation Age of Onset  . Diabetes Sister   . Heart disease Father 68       MI--1st Dx CAD. Died2that time  . Sudden death Father   . Diabetes Mother   . Hypertension Mother   . Heart disease Mother   . Obesity Mother   . Heart disease Maternal Uncle   . Ovarian cancer Maternal Grandmother   . Diabetes Maternal Grandmother   .  Heart disease Maternal Grandmother        Event organiser  . Diabetes Paternal Grandmother   . Liver disease Paternal Aunt   . Heart disease Maternal Grandfather     ROS: Review of Systems  Constitutional: Positive for weight loss.  Endo/Heme/Allergies:       Positive for polyphagia    PHYSICAL EXAM: Pt in no acute distress  RECENT LABS AND TESTS: BMET    Component Value Date/Time   NA 140 11/30/2018 1047   K 4.3 11/30/2018 1047   CL 101 11/30/2018 1047   CO2 23 11/30/2018 1047   GLUCOSE 92 11/30/2018 1047   GLUCOSE 97 12/24/2017 0818   BUN 12 11/30/2018 1047   CREATININE 0.57 11/30/2018 1047   CREATININE 0.63 12/24/2017 0818   CALCIUM 8.9 11/30/2018 1047   GFRNONAA 112 11/30/2018 1047   GFRNONAA 109 12/24/2017 0818   GFRAA 129 11/30/2018 1047   GFRAA 126 12/24/2017 0818   Lab Results  Component Value Date   HGBA1C 6.0 (H) 11/30/2018   Lab Results  Component Value Date   INSULIN 14.5 11/30/2018   CBC    Component Value Date/Time   WBC 9.8 11/30/2018 1047   WBC 9.6 12/24/2017 0818   RBC 4.51 11/30/2018 1047   RBC 5.06 12/24/2017 0818   HGB 13.6  11/30/2018 1047   HCT 39.1 11/30/2018 1047   PLT 303 11/30/2018 1047   MCV 87 11/30/2018 1047   MCH 30.2 11/30/2018 1047   MCH 29.2 12/24/2017 0818   MCHC 34.8 11/30/2018 1047   MCHC 33.4 12/24/2017 0818   RDW 12.9 11/30/2018 1047   LYMPHSABS 3.4 (H) 11/30/2018 1047   MONOABS 690 07/23/2016 1109   EOSABS 0.1 11/30/2018 1047   BASOSABS 0.0 11/30/2018 1047   Iron/TIBC/Ferritin/ %Sat No results found for: IRON, TIBC, FERRITIN, IRONPCTSAT Lipid Panel     Component Value Date/Time   CHOL 189 11/30/2018 1047   TRIG 161 (H) 11/30/2018 1047   HDL 43 11/30/2018 1047   CHOLHDL 4.4 11/30/2018 1047   CHOLHDL 4.2 12/24/2017 0818   VLDL 22 03/11/2015 0847   LDLCALC 114 (H) 11/30/2018 1047   LDLCALC 134 (H) 12/24/2017 0818   Hepatic Function Panel     Component Value Date/Time   PROT 6.8 11/30/2018 1047   ALBUMIN 4.2 11/30/2018 1047   AST 13 11/30/2018 1047   ALT 15 11/30/2018 1047   ALKPHOS 82 11/30/2018 1047   BILITOT 0.2 11/30/2018 1047      Component Value Date/Time   TSH 4.280 11/30/2018 1047   TSH 3.21 12/24/2017 0818   TSH 3.24 10/01/2016 1643     Ref. Range 11/30/2018 10:47  Vitamin D, 25-Hydroxy Latest Ref Range: 30.0 - 100.0 ng/mL 7.8 (L)    I, Doreene Nest, am acting as transcriptionist for Dennard Nip, MD I have reviewed the above documentation for accuracy and completeness, and I agree with the above. -Dennard Nip, MD

## 2019-04-26 ENCOUNTER — Encounter (INDEPENDENT_AMBULATORY_CARE_PROVIDER_SITE_OTHER): Payer: Self-pay | Admitting: Family Medicine

## 2019-04-26 ENCOUNTER — Ambulatory Visit (INDEPENDENT_AMBULATORY_CARE_PROVIDER_SITE_OTHER): Payer: 59 | Admitting: Family Medicine

## 2019-04-26 ENCOUNTER — Other Ambulatory Visit: Payer: Self-pay

## 2019-04-26 DIAGNOSIS — Z6838 Body mass index (BMI) 38.0-38.9, adult: Secondary | ICD-10-CM | POA: Diagnosis not present

## 2019-04-26 DIAGNOSIS — R7303 Prediabetes: Secondary | ICD-10-CM | POA: Diagnosis not present

## 2019-04-26 DIAGNOSIS — E559 Vitamin D deficiency, unspecified: Secondary | ICD-10-CM | POA: Diagnosis not present

## 2019-04-26 MED ORDER — METFORMIN HCL 500 MG PO TABS: 500.0000 mg | ORAL_TABLET | Freq: Every day | ORAL | 0 refills | Status: DC

## 2019-04-26 MED ORDER — VITAMIN D (ERGOCALCIFEROL) 1.25 MG (50000 UNIT) PO CAPS: 50000.0000 [IU] | ORAL_CAPSULE | ORAL | 0 refills | Status: DC

## 2019-04-26 MED FILL — metFORMIN HCL 500 MG TABS: 500 | 30 days supply | Qty: 30 | Fill #0

## 2019-05-01 MED FILL — VIT D2 1.25 MG (50,000 UNIT: 1.25 MG | 28 days supply | Qty: 4 | Fill #0

## 2019-05-02 NOTE — Progress Notes (Signed)
Office: [phone]  /  Fax: [phone] TeleHealth Visit:  [name] has verbally consented to this TeleHealth visit today. The patient is located at work, the provider is located at the [employer] [facility] office. The participants in this visit include the listed provider and patient. The visit was conducted today via Webex.  HPI:   Chief Complaint: OBESITY [name] is here to discuss her progress with her obesity treatment plan. She is on the Category 3 plan and is following her eating plan approximately 90 % of the time. She states she is walking 25 to 30 minutes 2 to 3 times per week. [name] continues to do well maintaining her weight. She weighs 214 pounds today. [name] notes reduced polyphagia with metformin and is doing well with her plan, except that she is very bored with lunch options.  We were unable to weigh the patient today for this TeleHealth visit. She feels as if she has maintained weight since her last visit. She has lost 16 lbs since starting treatment with [name].  Pre-Diabetes [name] has a diagnosis of pre-diabetes based on her elevated Hgb A1c and was informed this puts her at greater risk of developing diabetes. She was started on metformin and notes reduced polyphagia. She is doing better on her diet prescription. [name] continues to work on diet and exercise to decrease risk of diabetes. She denies nausea, vomiting, or hypoglycemia.   Vitamin D Deficiency [name] has a diagnosis of vitamin D deficiency. She is currently stable on vit D. [name] denies nausea, vomiting, or muscle weakness.  ASSESSMENT AND PLAN:  Vitamin D deficiency - Plan: Vitamin D, Ergocalciferol, (DRISDOL) 1.25 MG (50000 UT) CAPS capsule  Prediabetes - Plan: metFORMIN (GLUCOPHAGE) 500 MG tablet  Class 2 severe obesity with serious comorbidity and body mass index (BMI) of 38.0 to 38.9 in adult, unspecified obesity type [facility])  PLAN:  Pre-Diabetes [name] will continue to work  on weight loss, exercise, and decreasing simple carbohydrates in her diet to help decrease the risk of diabetes. She was informed that eating too many simple carbohydrates or too many calories at one sitting increases the likelihood of GI side effects. [name] agreed to continue metformin 500 mg qAM #30 with no refills and a prescription was written today. [name] agreed to follow up with [name] as directed to monitor her progress in 2 weeks.  Vitamin D Deficiency [name] was informed that low vitamin D levels contribute to fatigue and are associated with obesity, breast, and colon cancer. [name] agrees to continue to take prescription Vit D @50 ,000 IU every week #4 with no refills and will follow up for routine testing of vitamin D, at least 2-3 times per year. She was informed of the risk of over-replacement of vitamin D and agrees to not increase her dose unless she discusses this with [name] first. [name] agrees to follow up in 2 weeks as directed.  Obesity [name] is currently in the action stage of change. As such, her goal is to continue with weight loss efforts. She has agreed to follow the Category 3 plan and keep a food journal with 300 to 450 calories and 30+ grams of protein for lunch. [name] has been instructed to work up to a goal of 150 minutes of combined cardio and strengthening exercise per week for weight loss and overall health benefits. We discussed the following Behavioral Modification Strategies today: increasing lean protein intake, decreasing simple carbohydrates, and work on meal planning and easy cooking plans.  [name]  has agreed to follow up with our clinic in 2 weeks. She was informed of the importance of frequent follow up visits to maximize her success with intensive lifestyle modifications for her multiple health conditions.  ALLERGIES: Allergies  Allergen Reactions  . Adhesive [Tape] Rash  . Latex Rash    MEDICATIONS: Current Outpatient Medications on File Prior to  Visit  Medication Sig Dispense Refill  . albuterol (PROVENTIL HFA;VENTOLIN HFA) 108 (90 Base) MCG/ACT inhaler Inhale 2 puffs into the lungs every 6 (six) hours as needed for wheezing. 1 Inhaler 11  . ALPRAZolam (XANAX) 0.25 MG tablet Take 1 tablet (0.25 mg total) by mouth daily as needed for anxiety. 30 tablet 2  . amphetamine-dextroamphetamine (ADDERALL XR) 20 MG 24 hr capsule Take 1 capsule (20 mg total) by mouth every morning. 30 capsule 0  . furosemide (LASIX) 20 MG tablet Take 2 tablets (40 mg total) by mouth daily as needed (swelling). 30 tablet 2  . ibuprofen (ADVIL,MOTRIN) 200 MG tablet Take 400 mg by mouth 2 (two) times daily as needed for headache or moderate pain.    . metoprolol tartrate (LOPRESSOR) 100 MG tablet Take 1 tablet (100 mg total) by mouth 2 (two) times daily. 60 tablet 11  . nitroGLYCERIN (NITROSTAT) 0.4 MG SL tablet Place 1 tablet (0.4 mg total) under the tongue every 5 (five) minutes as needed for chest pain. 25 tablet 3   No current facility-administered medications on file prior to visit.     PAST MEDICAL HISTORY: Past Medical History:  Diagnosis Date  . Anxiety   . Asthma   . Back pain   . Chest pain   . Chronic CHF ([place])   . Dyspnea   . Echocardiogram 10/25/2015   EF 60-65, normal wall motion, grade 1 diastolic dysfunction, GLS -19.1%  . Edema of left lower extremity   . Fracture dislocation of elbow joint 01/15/2013   right  . GERD (gastroesophageal reflux disease)    Omeprazole as needed, none in 6 mos.  . Hyperlipidemia   . Hypertension   . Joint pain   . Obesity   . Palpitations   . Prediabetes   . R/L Cardiac catheterization 08/18/2018   Normal coronary arteries, mean RA 6, mean PA 17, PCWP 10  . Seborrheic dermatitis   . Sleep apnea    uses CPAP "most nights"    PAST SURGICAL HISTORY: Past Surgical History:  Procedure Laterality Date  . [place]; 10/10/2000   x 2  . KNEE ARTHROSCOPY Left 1991   also in 2019  .  LAPAROSCOPIC ASSISTED VAGINAL HYSTERECTOMY  11/02/2006  . LAPAROSCOPIC LYSIS OF ADHESIONS  03/06/2005  . RADIAL HEAD ARTHROPLASTY Right 01/17/2013   Procedure: RIGHT RADIAL HEAD ARTHROPLASTY OPEN REDUCTION INTERNAL FIXATION CORONOID ;  Surgeon: Tennis Must, MD;  Location: [place];  Service: Orthopedics;  Laterality: Right;  . RIGHT/LEFT HEART CATH AND CORONARY ANGIOGRAPHY N/A 08/18/2018   Procedure: RIGHT/LEFT HEART CATH AND CORONARY ANGIOGRAPHY;  Surgeon: Nelva Bush, MD;  Location: [place] [facility];  Service: Cardiovascular;  Laterality: N/A;  . TUBAL LIGATION  03/06/2005    SOCIAL HISTORY: Social History   Tobacco Use  . Smoking status: Never Smoker  . Smokeless tobacco: Never Used  Substance Use Topics  . Alcohol use: No  . Drug use: No    FAMILY HISTORY: Family History  Problem Relation Age of Onset  . Diabetes Sister   . Heart disease Father 65  MI--1st Dx CAD. Died2that time  . Sudden death Father   . Diabetes Mother   . Hypertension Mother   . Heart disease Mother   . Obesity Mother   . Heart disease Maternal Uncle   . Ovarian cancer Maternal Grandmother   . Diabetes Maternal Grandmother   . Heart disease Maternal Grandmother        Event organiser  . Diabetes Paternal Grandmother   . Liver disease Paternal Aunt   . Heart disease Maternal Grandfather     ROS: Review of Systems  Gastrointestinal: Negative for nausea and vomiting.  Musculoskeletal:       Negative for muscle weakness.  Endo/Heme/Allergies:       Positive for polyphagia.    PHYSICAL EXAM: Pt in no acute distress  RECENT LABS AND TESTS: BMET    Component Value Date/Time   NA 140 11/30/2018 1047   K 4.3 11/30/2018 1047   CL 101 11/30/2018 1047   CO2 23 11/30/2018 1047   GLUCOSE 92 11/30/2018 1047   GLUCOSE 97 12/24/2017 0818   BUN 12 11/30/2018 1047   CREATININE 0.57 11/30/2018 1047   CREATININE 0.63 12/24/2017 0818   CALCIUM 8.9 11/30/2018 1047    GFRNONAA 112 11/30/2018 1047   GFRNONAA 109 12/24/2017 0818   GFRAA 129 11/30/2018 1047   GFRAA 126 12/24/2017 0818   Lab Results  Component Value Date   HGBA1C 6.0 (H) 11/30/2018   Lab Results  Component Value Date   INSULIN 14.5 11/30/2018   CBC    Component Value Date/Time   WBC 9.8 11/30/2018 1047   WBC 9.6 12/24/2017 0818   RBC 4.51 11/30/2018 1047   RBC 5.06 12/24/2017 0818   HGB 13.6 11/30/2018 1047   HCT 39.1 11/30/2018 1047   PLT 303 11/30/2018 1047   MCV 87 11/30/2018 1047   MCH 30.2 11/30/2018 1047   MCH 29.2 12/24/2017 0818   MCHC 34.8 11/30/2018 1047   MCHC 33.4 12/24/2017 0818   RDW 12.9 11/30/2018 1047   LYMPHSABS 3.4 (H) 11/30/2018 1047   MONOABS 690 07/23/2016 1109   EOSABS 0.1 11/30/2018 1047   BASOSABS 0.0 11/30/2018 1047   Iron/TIBC/Ferritin/ %Sat No results found for: IRON, TIBC, FERRITIN, IRONPCTSAT Lipid Panel     Component Value Date/Time   CHOL 189 11/30/2018 1047   TRIG 161 (H) 11/30/2018 1047   HDL 43 11/30/2018 1047   CHOLHDL 4.4 11/30/2018 1047   CHOLHDL 4.2 12/24/2017 0818   VLDL 22 03/11/2015 0847   LDLCALC 114 (H) 11/30/2018 1047   LDLCALC 134 (H) 12/24/2017 0818   Hepatic Function Panel     Component Value Date/Time   PROT 6.8 11/30/2018 1047   ALBUMIN 4.2 11/30/2018 1047   AST 13 11/30/2018 1047   ALT 15 11/30/2018 1047   ALKPHOS 82 11/30/2018 1047   BILITOT 0.2 11/30/2018 1047      Component Value Date/Time   TSH 4.280 11/30/2018 1047   TSH 3.21 12/24/2017 0818   TSH 3.24 10/01/2016 1643   Results for [place], [place] (MRN [id number]) as of 05/02/2019 12:44  Ref. Range 11/30/2018 10:47  Vitamin D, 25-Hydroxy Latest Ref Range: 30.0 - 100.0 ng/mL 7.8 (L)     I, Marcille Blanco, CMA, am acting as transcriptionist for Starlyn Skeans, MD I have reviewed the above documentation for accuracy and completeness, and I agree with the above. -Dennard Nip, MD

## 2019-05-17 ENCOUNTER — Other Ambulatory Visit: Payer: Self-pay

## 2019-05-17 ENCOUNTER — Encounter (INDEPENDENT_AMBULATORY_CARE_PROVIDER_SITE_OTHER): Payer: Self-pay | Admitting: Family Medicine

## 2019-05-17 ENCOUNTER — Telehealth (INDEPENDENT_AMBULATORY_CARE_PROVIDER_SITE_OTHER): Payer: 59 | Admitting: Family Medicine

## 2019-05-17 DIAGNOSIS — R7303 Prediabetes: Secondary | ICD-10-CM

## 2019-05-17 DIAGNOSIS — E559 Vitamin D deficiency, unspecified: Secondary | ICD-10-CM

## 2019-05-17 DIAGNOSIS — Z6838 Body mass index (BMI) 38.0-38.9, adult: Secondary | ICD-10-CM

## 2019-05-17 MED ORDER — VITAMIN D (ERGOCALCIFEROL) 1.25 MG (50000 UNIT) PO CAPS: 50000.0000 [IU] | ORAL_CAPSULE | ORAL | 0 refills | Status: DC

## 2019-05-18 NOTE — Progress Notes (Signed)
Office: [phone]  /  Fax: [phone] TeleHealth Visit:  [name] has verbally consented to this TeleHealth visit today. The patient is located at home, the provider is located at the [facility] office. The participants in this visit include the listed provider and patient. The visit was conducted today via [employer].  HPI:   Chief Complaint: OBESITY [name] is here to discuss her progress with her obesity treatment plan. She is on the Category 3 plan and is following her eating plan approximately 60% of the time. She states she is walking 25-30 minutes 3 times per week. [name] has done well maintaining her weight. She went to the beach for vacation and did some celebration eating and drinking and thinks she may have gained but now she is back on track. We were unable to weigh the patient today for this TeleHealth visit. She feels as if she has lost 2 lbs since her last visit. She has lost 16 lbs since starting treatment with [name].  Vitamin D deficiency [name] has a diagnosis of Vitamin D deficiency. She is currently stable on prescription Vit D and denies nausea, vomiting or muscle weakness.  Pre-Diabetes [name] has a diagnosis of prediabetes based on her elevated Hgb A1c and was informed this puts her at greater risk of developing diabetes. She is stable on metformin and diet currently and continues to work on diet and exercise to decrease risk of diabetes. She denies nausea, vomiting, or hypoglycemia.  ASSESSMENT AND PLAN:  Prediabetes  Vitamin D deficiency - Plan: Vitamin D, Ergocalciferol, (DRISDOL) 1.25 MG (50000 UT) CAPS capsule  Class 2 severe obesity with serious comorbidity and body mass index (BMI) of 38.0 to 38.9 in adult, unspecified obesity type ([place])  PLAN:  Vitamin D Deficiency [name] was informed that low Vitamin D levels contributes to fatigue and are associated with obesity, breast, and colon cancer. She agrees to continue to take  prescription Vit D @ 50,000 IU every week #4 with 0 refills and will follow-up for routine testing of Vitamin D at her next office visit. She was informed of the risk of over-replacement of Vitamin D and agrees to not increase her dose unless she discusses this with [name] first. [name] agrees to follow-up with our clinic in 3 weeks.  Pre-Diabetes [name] will continue to work on weight loss, exercise, and decreasing simple carbohydrates in her diet to help decrease the risk of diabetes. We dicussed metformin including benefits and risks. She was informed that eating too many simple carbohydrates or too many calories at one sitting increases the likelihood of GI side effects. [name] was given a refill on her metformin for 1 month and will have labs checked at her next office visit.  I spent > than 50% of the 25 minute visit on counseling as documented in the note.  Obesity [name] is currently in the action stage of change. As such, her goal is to continue with weight loss efforts. She has agreed to follow the Category 3 plan. [name] has been instructed to work up to a goal of 150 minutes of combined cardio and strengthening exercise per week for weight loss and overall health benefits. We discussed the following Behavioral Modification Strategies today: increasing lean protein intake, work on meal planning and easy cooking plans, and holiday eating strategies.  [name] has agreed to follow-up with our clinic in 3 weeks (IO). She was informed of the importance of frequent follow-up visits to maximize her success with intensive lifestyle  modifications for her multiple health conditions.  ALLERGIES: Allergies  Allergen Reactions   Adhesive [Tape] Rash   Latex Rash    MEDICATIONS: Current Outpatient Medications on File Prior to Visit  Medication Sig Dispense Refill   albuterol (PROVENTIL HFA;VENTOLIN HFA) 108 (90 Base) MCG/ACT inhaler Inhale 2 puffs into the lungs every 6 (six) hours as  needed for wheezing. 1 Inhaler 11   ALPRAZolam (XANAX) 0.25 MG tablet Take 1 tablet (0.25 mg total) by mouth daily as needed for anxiety. 30 tablet 2   amphetamine-dextroamphetamine (ADDERALL XR) 20 MG 24 hr capsule Take 1 capsule (20 mg total) by mouth every morning. 30 capsule 0   furosemide (LASIX) 20 MG tablet Take 2 tablets (40 mg total) by mouth daily as needed (swelling). 30 tablet 2   ibuprofen (ADVIL,MOTRIN) 200 MG tablet Take 400 mg by mouth 2 (two) times daily as needed for headache or moderate pain.     metFORMIN (GLUCOPHAGE) 500 MG tablet Take 1 tablet (500 mg total) by mouth daily with breakfast. 30 tablet 0   metoprolol tartrate (LOPRESSOR) 100 MG tablet Take 1 tablet (100 mg total) by mouth 2 (two) times daily. 60 tablet 11   nitroGLYCERIN (NITROSTAT) 0.4 MG SL tablet Place 1 tablet (0.4 mg total) under the tongue every 5 (five) minutes as needed for chest pain. 25 tablet 3   No current facility-administered medications on file prior to visit.     PAST MEDICAL HISTORY: Past Medical History:  Diagnosis Date   Anxiety    Asthma    Back pain    Chest pain    Chronic CHF (HCC)    Dyspnea    Echocardiogram 10/25/2015   EF 60-65, normal wall motion, grade 1 diastolic dysfunction, GLS -19.1%   Edema of left lower extremity    Fracture dislocation of elbow joint 01/15/2013   right   GERD (gastroesophageal reflux disease)    Omeprazole as needed, none in 6 mos.   Hyperlipidemia    Hypertension    Joint pain    Obesity    Palpitations    Prediabetes    R/L Cardiac catheterization 08/18/2018   Normal coronary arteries, mean RA 6, mean PA 17, PCWP 10   Seborrheic dermatitis    Sleep apnea    uses CPAP "most nights"    PAST SURGICAL HISTORY: Past Surgical History:  Procedure Laterality Date   CESAREAN SECTION  1998; 10/10/2000   x 2   KNEE ARTHROSCOPY Left 1991   also in 2019   [place]  11/02/2006    LAPAROSCOPIC LYSIS OF ADHESIONS  03/06/2005   RADIAL HEAD ARTHROPLASTY Right 01/17/2013   Procedure: RIGHT RADIAL HEAD ARTHROPLASTY OPEN REDUCTION INTERNAL FIXATION CORONOID ;  Surgeon: Tennis Must, MD;  Location: [facility];  Service: Orthopedics;  Laterality: Right;   RIGHT/LEFT HEART CATH AND CORONARY ANGIOGRAPHY N/A 08/18/2018   Procedure: RIGHT/LEFT HEART CATH AND CORONARY ANGIOGRAPHY;  Surgeon: Nelva Bush, MD;  Location: [facility];  Service: Cardiovascular;  Laterality: N/A;   TUBAL LIGATION  03/06/2005    SOCIAL HISTORY: Social History   Tobacco Use   Smoking status: Never Smoker   Smokeless tobacco: Never Used  Substance Use Topics   Alcohol use: No   Drug use: No    FAMILY HISTORY: Family History  Problem Relation Age of Onset   Diabetes Sister    Heart disease Father 73       MI--1st Dx CAD. Died2that  time   Sudden death Father    Diabetes Mother    Hypertension Mother    Heart disease Mother    Obesity Mother    Heart disease Maternal Uncle    Ovarian cancer Maternal Grandmother    Diabetes Maternal Grandmother    Heart disease Maternal Grandmother        Great Grandmother   Diabetes Paternal Grandmother    Liver disease Paternal Aunt    Heart disease Maternal Grandfather    ROS: Review of Systems  Gastrointestinal: Negative for nausea and vomiting.  Musculoskeletal:       Negative for muscle weakness.  Endo/Heme/Allergies:       Negative for hypoglycemia.   PHYSICAL EXAM: Pt in no acute distress  RECENT LABS AND TESTS: BMET    Component Value Date/Time   NA 140 11/30/2018 1047   K 4.3 11/30/2018 1047   CL 101 11/30/2018 1047   CO2 23 11/30/2018 1047   GLUCOSE 92 11/30/2018 1047   GLUCOSE 97 12/24/2017 0818   BUN 12 11/30/2018 1047   CREATININE 0.57 11/30/2018 1047   CREATININE 0.63 12/24/2017 0818   CALCIUM 8.9 11/30/2018 1047   GFRNONAA 112 11/30/2018 1047   GFRNONAA 109 12/24/2017 0818    GFRAA 129 11/30/2018 1047   GFRAA 126 12/24/2017 0818   Lab Results  Component Value Date   HGBA1C 6.0 (H) 11/30/2018   Lab Results  Component Value Date   INSULIN 14.5 11/30/2018   CBC    Component Value Date/Time   WBC 9.8 11/30/2018 1047   WBC 9.6 12/24/2017 0818   RBC 4.51 11/30/2018 1047   RBC 5.06 12/24/2017 0818   HGB 13.6 11/30/2018 1047   HCT 39.1 11/30/2018 1047   PLT 303 11/30/2018 1047   MCV 87 11/30/2018 1047   MCH 30.2 11/30/2018 1047   MCH 29.2 12/24/2017 0818   MCHC 34.8 11/30/2018 1047   MCHC 33.4 12/24/2017 0818   RDW 12.9 11/30/2018 1047   LYMPHSABS 3.4 (H) 11/30/2018 1047   MONOABS 690 07/23/2016 1109   EOSABS 0.1 11/30/2018 1047   BASOSABS 0.0 11/30/2018 1047   Iron/TIBC/Ferritin/ %Sat No results found for: IRON, TIBC, FERRITIN, IRONPCTSAT Lipid Panel     Component Value Date/Time   CHOL 189 11/30/2018 1047   TRIG 161 (H) 11/30/2018 1047   HDL 43 11/30/2018 1047   CHOLHDL 4.4 11/30/2018 1047   CHOLHDL 4.2 12/24/2017 0818   VLDL 22 03/11/2015 0847   LDLCALC 114 (H) 11/30/2018 1047   LDLCALC 134 (H) 12/24/2017 0818   Hepatic Function Panel     Component Value Date/Time   PROT 6.8 11/30/2018 1047   ALBUMIN 4.2 11/30/2018 1047   AST 13 11/30/2018 1047   ALT 15 11/30/2018 1047   ALKPHOS 82 11/30/2018 1047   BILITOT 0.2 11/30/2018 1047      Component Value Date/Time   TSH 4.280 11/30/2018 1047   TSH 3.21 12/24/2017 0818   TSH 3.24 10/01/2016 1643   Results for Alison, Jenkins (MRN [id number]) as of 05/18/2019 14:19  Ref. Range 11/30/2018 10:47  Vitamin D, 25-Hydroxy Latest Ref Range: 30.0 - 100.0 ng/mL 7.8 (L)   I, Michaelene Song, am acting as Location manager for Dennard Nip, MD I have reviewed the above documentation for accuracy and completeness, and I agree with the above. -Dennard Nip, MD

## 2019-05-23 ENCOUNTER — Other Ambulatory Visit: Payer: Self-pay | Admitting: Family Medicine

## 2019-05-23 NOTE — Telephone Encounter (Signed)
Ok to refill??  Last office visit 01/13/2019.  Last refill 04/07/2019.

## 2019-05-25 MED ORDER — AMPHETAMINE-DEXTROAMPHET ER 20 MG PO CP24: 20.0000 mg | ORAL_CAPSULE | ORAL | 0 refills | Status: DC

## 2019-05-25 MED FILL — ADDERALL XR 20 MG CAP SA: 20 | 30 days supply | Qty: 30 | Fill #0

## 2019-06-06 ENCOUNTER — Ambulatory Visit (INDEPENDENT_AMBULATORY_CARE_PROVIDER_SITE_OTHER): Payer: 59 | Admitting: Family Medicine

## 2019-06-06 ENCOUNTER — Encounter (INDEPENDENT_AMBULATORY_CARE_PROVIDER_SITE_OTHER): Payer: Self-pay | Admitting: Family Medicine

## 2019-06-06 ENCOUNTER — Other Ambulatory Visit: Payer: Self-pay

## 2019-06-06 VITALS — BP 128/83 | HR 90 | Temp 97.8°F | Ht 63.0 in | Wt 205.0 lb

## 2019-06-06 DIAGNOSIS — R7303 Prediabetes: Secondary | ICD-10-CM | POA: Diagnosis not present

## 2019-06-06 DIAGNOSIS — E559 Vitamin D deficiency, unspecified: Secondary | ICD-10-CM

## 2019-06-06 DIAGNOSIS — Z9189 Other specified personal risk factors, not elsewhere classified: Secondary | ICD-10-CM | POA: Diagnosis not present

## 2019-06-06 DIAGNOSIS — Z6836 Body mass index (BMI) 36.0-36.9, adult: Secondary | ICD-10-CM | POA: Diagnosis not present

## 2019-06-06 MED ORDER — VITAMIN D (ERGOCALCIFEROL) 1.25 MG (50000 UNIT) PO CAPS: 50000.0000 [IU] | ORAL_CAPSULE | ORAL | 0 refills | Status: DC

## 2019-06-06 MED ORDER — METFORMIN HCL 500 MG PO TABS: 500.0000 mg | ORAL_TABLET | Freq: Every day | ORAL | 0 refills | Status: DC

## 2019-06-06 MED FILL — VIT D2 1.25 MG (50,000 UNIT: 1.25 MG | 28 days supply | Qty: 4 | Fill #0

## 2019-06-06 MED FILL — metFORMIN HCL 500 MG TABS: 500 | 30 days supply | Qty: 30 | Fill #0

## 2019-06-06 NOTE — Progress Notes (Signed)
Office: [phone]  /  Fax: [phone]   HPI:   Chief Complaint: OBESITY [name] is here to discuss her progress with her obesity treatment plan. She is on the Category 3 plan and is following her eating plan approximately 85 to 90 % of the time. She states she is walking 15 minutes 3 to 4 times per week. [name] has done well on her Category 3 plan. She has lost 14 pounds since her last in office visit 4 months ago due to [place]. She has had some increased stress eating with family stressors, but has done well minimizing this behavior.  Her weight is 205 lb (93 kg) today and has had a weight loss of 14 pounds over a period of 4 months since her last visit. She has lost 30 lbs since starting treatment with [name].  Vitamin D Deficiency [name] has a diagnosis of vitamin D deficiency. She is currently stable on vit D, but is not yet at goal. She is due for labs. [name] denies nausea, vomiting, or muscle weakness.  Pre-Diabetes [name] has a diagnosis of pre-diabetes based on her elevated Hgb A1c and was informed this puts her at greater risk of developing diabetes. She is stable on metformin currently and continues to work on diet and exercise to decrease risk of diabetes. [name] is due for labs. She denies nausea, vomiting, or hypoglycemia.   At risk for osteopenia and osteoporosis [name] is at higher risk of osteopenia and osteoporosis due to vitamin D deficiency.   ASSESSMENT AND PLAN:  Vitamin D deficiency - Plan: Vitamin D, Ergocalciferol, (DRISDOL) 1.25 MG (50000 UT) CAPS capsule  Prediabetes - Plan: metFORMIN (GLUCOPHAGE) 500 MG tablet  At risk for osteoporosis  Class 2 severe obesity with serious comorbidity and body mass index (BMI) of 36.0 to 36.9 in adult, unspecified obesity type ([name])  PLAN:  Vitamin D Deficiency [name] was informed that low vitamin D levels contribute to fatigue and are associated with obesity, breast, and colon cancer. [name] agrees to continue  to take prescription Vit D @50 ,000 IU every week #4 with no refills and will follow up for routine testing of vitamin D, at least 2-3 times per year. She was informed of the risk of over-replacement of vitamin D and agrees to not increase her dose unless she discusses this with [name] first. We will check labs in 2 weeks and [name] agrees to follow up in 2 to 3 weeks as directed.  At risk for osteopenia and osteoporosis [name] was given extended (15 minutes) osteoporosis prevention counseling today. [name] is at risk for osteopenia and osteoporosis due to her vitamin D deficiency. She was encouraged to take her vitamin D and follow her higher calcium diet and increase strengthening exercise to help strengthen her bones and decrease her risk of osteopenia and osteoporosis.  Pre-Diabetes [name] will continue to work on weight loss, exercise, and decreasing simple carbohydrates in her diet to help decrease the risk of diabetes. She was informed that eating too many simple carbohydrates or too many calories at one sitting increases the likelihood of GI side effects. [name] agreed to continue metformin 500 mg qAM #30 with no refills and a prescription was written today. [name] agreed to follow up with [name] as directed to monitor her progress in 2 to 3 weeks for a fasting appointment, as we will order labs then.   Obesity [name] is currently in the action stage of change. As such, her goal is to continue with weight loss efforts.  She has agreed to follow the Category 3 plan. [name] has been instructed to work up to a goal of 150 minutes of combined cardio and strengthening exercise per week for weight loss and overall health benefits. We discussed the following Behavioral Modification Strategies today: increasing lean protein intake, decreasing simple carbohydrates, keeping healthy foods in the home, and emotional eating strategies.  [name] has agreed to follow up with our clinic in 2 to 3 weeks. She was  informed of the importance of frequent follow up visits to maximize her success with intensive lifestyle modifications for her multiple health conditions.  ALLERGIES: Allergies  Allergen Reactions  . Adhesive [Tape] Rash  . Latex Rash    MEDICATIONS: Current Outpatient Medications on File Prior to Visit  Medication Sig Dispense Refill  . albuterol (PROVENTIL HFA;VENTOLIN HFA) 108 (90 Base) MCG/ACT inhaler Inhale 2 puffs into the lungs every 6 (six) hours as needed for wheezing. 1 Inhaler 11  . ALPRAZolam (XANAX) 0.25 MG tablet Take 1 tablet (0.25 mg total) by mouth daily as needed for anxiety. 30 tablet 2  . amphetamine-dextroamphetamine (ADDERALL XR) 20 MG 24 hr capsule Take 1 capsule (20 mg total) by mouth every morning. 30 capsule 0  . furosemide (LASIX) 20 MG tablet Take 2 tablets (40 mg total) by mouth daily as needed (swelling). 30 tablet 2  . ibuprofen (ADVIL,MOTRIN) 200 MG tablet Take 400 mg by mouth 2 (two) times daily as needed for headache or moderate pain.    . nitroGLYCERIN (NITROSTAT) 0.4 MG SL tablet Place 1 tablet (0.4 mg total) under the tongue every 5 (five) minutes as needed for chest pain. 25 tablet 3   No current facility-administered medications on file prior to visit.     PAST MEDICAL HISTORY: Past Medical History:  Diagnosis Date  . Anxiety   . Asthma   . Back pain   . Chest pain   . Chronic CHF ([place])   . Dyspnea   . Echocardiogram 10/25/2015   EF 60-65, normal wall motion, grade 1 diastolic dysfunction, GLS -19.1%  . Edema of left lower extremity   . Fracture dislocation of elbow joint 01/15/2013   right  . GERD (gastroesophageal reflux disease)    Omeprazole as needed, none in 6 mos.  . Hyperlipidemia   . Hypertension   . Joint pain   . Obesity   . Palpitations   . Prediabetes   . R/L Cardiac catheterization 08/18/2018   Normal coronary arteries, mean RA 6, mean PA 17, PCWP 10  . Seborrheic dermatitis   . Sleep apnea    uses CPAP "most nights"     PAST SURGICAL HISTORY: Past Surgical History:  Procedure Laterality Date  . [place]; 10/10/2000   x 2  . KNEE ARTHROSCOPY Left 1991   also in 2019  . LAPAROSCOPIC ASSISTED VAGINAL HYSTERECTOMY  11/02/2006  . LAPAROSCOPIC LYSIS OF ADHESIONS  03/06/2005  . RADIAL HEAD ARTHROPLASTY Right 01/17/2013   Procedure: RIGHT RADIAL HEAD ARTHROPLASTY OPEN REDUCTION INTERNAL FIXATION CORONOID ;  Surgeon: Tennis Must, MD;  Location: [facility];  Service: Orthopedics;  Laterality: Right;  . RIGHT/LEFT HEART CATH AND CORONARY ANGIOGRAPHY N/A 08/18/2018   Procedure: RIGHT/LEFT HEART CATH AND CORONARY ANGIOGRAPHY;  Surgeon: Nelva Bush, MD;  Location: [facility];  Service: Cardiovascular;  Laterality: N/A;  . TUBAL LIGATION  03/06/2005    SOCIAL HISTORY: Social History   Tobacco Use  . Smoking status: Never Smoker  . Smokeless  tobacco: Never Used  Substance Use Topics  . Alcohol use: No  . Drug use: No    FAMILY HISTORY: Family History  Problem Relation Age of Onset  . Diabetes Sister   . Heart disease Father 81       MI--1st Dx CAD. Died2that time  . Sudden death Father   . Diabetes Mother   . Hypertension Mother   . Heart disease Mother   . Obesity Mother   . Heart disease Maternal Uncle   . Ovarian cancer Maternal Grandmother   . Diabetes Maternal Grandmother   . Heart disease Maternal Grandmother        Event organiser  . Diabetes Paternal Grandmother   . Liver disease Paternal Aunt   . Heart disease Maternal Grandfather     ROS: Review of Systems  Constitutional: Positive for weight loss.  Gastrointestinal: Negative for nausea and vomiting.  Musculoskeletal:       Negative for muscle weakness.  Endo/Heme/Allergies:       Negative for hypoglycemia.    PHYSICAL EXAM: Blood pressure 128/83, pulse 90, temperature 97.8 F (36.6 C), temperature source Oral, height 5\' 3"  (1.6 m), weight 205 lb (93 kg), SpO2 97 %. Body mass index  is 36.31 kg/m. Physical Exam Vitals signs reviewed.  Constitutional:      Appearance: Normal appearance. She is obese.  Cardiovascular:     Rate and Rhythm: Normal rate.  Pulmonary:     Effort: Pulmonary effort is normal.  Musculoskeletal: Normal range of motion.  Skin:    General: Skin is warm and dry.  Neurological:     Mental Status: She is alert and oriented to person, place, and time.  Psychiatric:        Mood and Affect: Mood normal.        Behavior: Behavior normal.     RECENT LABS AND TESTS: BMET    Component Value Date/Time   NA 140 11/30/2018 1047   K 4.3 11/30/2018 1047   CL 101 11/30/2018 1047   CO2 23 11/30/2018 1047   GLUCOSE 92 11/30/2018 1047   GLUCOSE 97 12/24/2017 0818   BUN 12 11/30/2018 1047   CREATININE 0.57 11/30/2018 1047   CREATININE 0.63 12/24/2017 0818   CALCIUM 8.9 11/30/2018 1047   GFRNONAA 112 11/30/2018 1047   GFRNONAA 109 12/24/2017 0818   GFRAA 129 11/30/2018 1047   GFRAA 126 12/24/2017 0818   Lab Results  Component Value Date   HGBA1C 6.0 (H) 11/30/2018   Lab Results  Component Value Date   INSULIN 14.5 11/30/2018   CBC    Component Value Date/Time   WBC 9.8 11/30/2018 1047   WBC 9.6 12/24/2017 0818   RBC 4.51 11/30/2018 1047   RBC 5.06 12/24/2017 0818   HGB 13.6 11/30/2018 1047   HCT 39.1 11/30/2018 1047   PLT 303 11/30/2018 1047   MCV 87 11/30/2018 1047   MCH 30.2 11/30/2018 1047   MCH 29.2 12/24/2017 0818   MCHC 34.8 11/30/2018 1047   MCHC 33.4 12/24/2017 0818   RDW 12.9 11/30/2018 1047   LYMPHSABS 3.4 (H) 11/30/2018 1047   MONOABS 690 07/23/2016 1109   EOSABS 0.1 11/30/2018 1047   BASOSABS 0.0 11/30/2018 1047   Iron/TIBC/Ferritin/ %Sat No results found for: IRON, TIBC, FERRITIN, IRONPCTSAT Lipid Panel     Component Value Date/Time   CHOL 189 11/30/2018 1047   TRIG 161 (H) 11/30/2018 1047   HDL 43 11/30/2018 1047   CHOLHDL 4.4 11/30/2018 1047  CHOLHDL 4.2 12/24/2017 0818   VLDL 22 03/11/2015 0847    LDLCALC 114 (H) 11/30/2018 1047   LDLCALC 134 (H) 12/24/2017 0818   Hepatic Function Panel     Component Value Date/Time   PROT 6.8 11/30/2018 1047   ALBUMIN 4.2 11/30/2018 1047   AST 13 11/30/2018 1047   ALT 15 11/30/2018 1047   ALKPHOS 82 11/30/2018 1047   BILITOT 0.2 11/30/2018 1047      Component Value Date/Time   TSH 4.280 11/30/2018 1047   TSH 3.21 12/24/2017 0818   TSH 3.24 10/01/2016 1643   Results for EWELINA, NAVES (MRN [id number]) as of 06/06/2019 14:47  Ref. Range 11/30/2018 10:47  Vitamin D, 25-Hydroxy Latest Ref Range: 30.0 - 100.0 ng/mL 7.8 (L)     OBESITY BEHAVIORAL INTERVENTION VISIT  Today's visit was # 12   Starting weight: 235 lbs Starting date: 11/30/18 Today's weight : Weight: 205 lb (93 kg)  Today's date: 06/06/2019 Total lbs lost to date: 30    06/06/2019  Height 5\' 3"  (1.6 m)  Weight 205 lb (93 kg)  BMI (Calculated) 36.32  BLOOD PRESSURE - SYSTOLIC 382  BLOOD PRESSURE - DIASTOLIC 83   Body Fat % 43 %  Total Body Water (lbs) 89 lbs    ASK: We discussed the diagnosis of obesity with Maureen Chatters today and Ayako agreed to give [name] permission to discuss obesity behavioral modification therapy today.  ASSESS: Concha has the diagnosis of obesity and her BMI today is 36.32. Nataley is in the action stage of change.   ADVISE: Jenai was educated on the multiple health risks of obesity as well as the benefit of weight loss to improve her health. She was advised of the need for long term treatment and the importance of lifestyle modifications to improve her current health and to decrease her risk of future health problems.  AGREE: Multiple dietary modification options and treatment options were discussed and Nasim agreed to follow the recommendations documented in the above note.  ARRANGE: Clarie was educated on the importance of frequent visits to treat obesity as outlined per CMS and USPSTF guidelines and agreed to schedule her next  follow up appointment today.  IMarcille Blanco, CMA, am acting as transcriptionist for Starlyn Skeans, MD I have reviewed the above documentation for accuracy and completeness, and I agree with the above. -Dennard Nip, MD

## 2019-06-20 ENCOUNTER — Telehealth (INDEPENDENT_AMBULATORY_CARE_PROVIDER_SITE_OTHER): Payer: 59 | Admitting: Cardiovascular Disease

## 2019-06-20 ENCOUNTER — Other Ambulatory Visit: Payer: Self-pay

## 2019-06-20 VITALS — BP 146/94 | HR 82 | Temp 97.8°F | Ht 62.0 in | Wt 205.0 lb

## 2019-06-20 DIAGNOSIS — R000 Tachycardia, unspecified: Secondary | ICD-10-CM

## 2019-06-20 DIAGNOSIS — Z7189 Other specified counseling: Secondary | ICD-10-CM

## 2019-06-20 DIAGNOSIS — I5189 Other ill-defined heart diseases: Secondary | ICD-10-CM | POA: Diagnosis not present

## 2019-06-20 MED ORDER — METOPROLOL TARTRATE 25 MG PO TABS: 25.0000 mg | ORAL_TABLET | Freq: Two times a day (BID) | ORAL | 3 refills | Status: DC

## 2019-06-20 MED FILL — METOPROLOL TARTRATE 25 MG T: 25 | 90 days supply | Qty: 180 | Fill #0

## 2019-06-20 NOTE — Patient Instructions (Signed)
Medication Instructions:  Your physician recommends that you continue on your current medications as directed. Please refer to the Current Medication list given to you today. Resume your Metoprolol 25 mg twice daily today - a new prescription has been sent to your pharmacy  If you need a refill on your cardiac medications before your next appointment, please call your pharmacy.    Lab work: None Ordered    Testing/Procedures: None Ordered   Follow-Up: At [facility], you and your health needs are our priority.  As part of our continuing mission to provide you with exceptional heart care, we have created designated Provider Care Teams.  These Care Teams include your primary Cardiologist (physician) and Advanced Practice Providers (APPs -  Physician Assistants and Nurse Practitioners) who all work together to provide you with the care you need, when you need it. You will need a follow up appointment in:  6 months.  Please call our office 2 months in advance to schedule this appointment.  You may see Mertie Moores, MD or one of the following Advanced Practice Providers on your designated Care Team: Richardson Dopp, PA-C [place], [place] . Daune Perch, NP

## 2019-06-20 NOTE — Progress Notes (Signed)
Virtual Visit via Telephone Note   This visit type was conducted due to national recommendations for restrictions regarding the COVID-19 Pandemic (e.g. social distancing) in an effort to limit this patient's exposure and mitigate transmission in our community.  Due to her co-morbid illnesses, this patient is at least at moderate risk for complications without adequate follow up.  This format is felt to be most appropriate for this patient at this time.  The patient did not have access to video technology/had technical difficulties with video requiring transitioning to audio format only (telephone).  All issues noted in this document were discussed and addressed.  No physical exam could be performed with this format.  Please refer to the patient's chart for her  consent to telehealth for [facility].   Date:  06/20/2019   ID:  [name], DOB [birth year], MRN [id number]  Patient Location: Home Provider Location: Home  PCP:  Susy Frizzle, MD  Cardiologist:  Mertie Moores, MD  Electrophysiologist:  None   Problem List 1.  Diastolic dysfunction 2. Hypertension 3. Obesity 4. Palpitations  5. Obstructive sleep apnea - has not been using her CPAP, Needs to see a pulmonologist to get another mask.    [name] is a 48 y.o. female who presents for  Further evaluation of her shortness breath and palpitations. Has had palpitations for several months.   Seem to be worse at night , while watching TV.  Also has dyspnea - primarily at rest.   Not related to lying down. Has DOE Works out at [employer] , has been going for the past month. Has had shortness of breath since getting back to the gym.    Was given lasix 20 as needed - takes it once every 1-2 months.   Seems to need to take the lasix after she has eaten more salty foods than she should .    She knows that she should limit salt intake but has not made any real effort.   Has lost 4 lbs since Jan. 1.  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    Husband is a Hotel manager.   2 boys at home,  Age 26 and 63.   Sept. 7, 2017: [name] is seen for a follow up visit This past Saturday ,  Has had some hot flashes / sweats  HR has been very high for the past several weeks.  (is on adderall)  No diarrhea. No weight loss.   Has not been using her CPAP mask - ends up taking the CPAP mask off after 15 minutes.   Oct. 9, 2017:  [name] is seen back for follow up of Her chronic diastolic congestive heart failure. She was seen in Sept.  We stopped the Bystolic and started Metoprolol 50 BID  Has been checking BP , Now works at [employer].   Mar 17, 2018: [name] is seen back today for further evaluation of her sinus tachycardia and diastolic heart failure. Had partial left knee replacement [name] )  Occasional episodes of shortness of breath.   Is walking at lunch - gets a little short of breath  Is making progress with her walking   Wt today is 228 lbs.    Evaluation Performed:  Follow-Up Visit  Chief Complaint:  Dyspnea   Aug. 4, 2020    [name] is a 48 y.o. female with  Hx of tachycardia and chest  pain Cath in Oct. 2019 showed normal cors,  Has hx of diastolic CHF, OSA, d-dimer Under lots of stress with home life  Has hx of sinus tach and HTN Has not refilled her metoprolol   Wt today is 205 lbs  No cp recently . Has lost 30 lbs since January .  [facility] the [facility] Has minimal swelling in left foot.    Is walking regularly .     The patient does not have symptoms concerning for COVID-19 infection (fever, chills, cough, or new shortness of breath).    Past Medical History:  Diagnosis Date  . Anxiety   . Asthma   . Back pain   . Chest pain   . Chronic CHF ([place])   . Dyspnea   . Echocardiogram 10/25/2015   EF 60-65, normal wall motion, grade 1 diastolic dysfunction, GLS -19.1%  . Edema of  left lower extremity   . Fracture dislocation of elbow joint 01/15/2013   right  . GERD (gastroesophageal reflux disease)    Omeprazole as needed, none in 6 mos.  . Hyperlipidemia   . Hypertension   . Joint pain   . Obesity   . Palpitations   . Prediabetes   . R/L Cardiac catheterization 08/18/2018   Normal coronary arteries, mean RA 6, mean PA 17, PCWP 10  . Seborrheic dermatitis   . Sleep apnea    uses CPAP "most nights"   Past Surgical History:  Procedure Laterality Date  . [place]; 10/10/2000   x 2  . KNEE ARTHROSCOPY Left 1991   also in 2019  . LAPAROSCOPIC ASSISTED VAGINAL HYSTERECTOMY  11/02/2006  . LAPAROSCOPIC LYSIS OF ADHESIONS  03/06/2005  . RADIAL HEAD ARTHROPLASTY Right 01/17/2013   Procedure: RIGHT RADIAL HEAD ARTHROPLASTY OPEN REDUCTION INTERNAL FIXATION CORONOID ;  Surgeon: Tennis Must, MD;  Location: [place];  Service: Orthopedics;  Laterality: Right;  . RIGHT/LEFT HEART CATH AND CORONARY ANGIOGRAPHY N/A 08/18/2018   Procedure: RIGHT/LEFT HEART CATH AND CORONARY ANGIOGRAPHY;  Surgeon: Nelva Bush, MD;  Location: [place] [facility];  Service: Cardiovascular;  Laterality: N/A;  . TUBAL LIGATION  03/06/2005     Current Meds  Medication Sig  . albuterol (PROVENTIL HFA;VENTOLIN HFA) 108 (90 Base) MCG/ACT inhaler Inhale 2 puffs into the lungs every 6 (six) hours as needed for wheezing.  Marland Kitchen ALPRAZolam (XANAX) 0.25 MG tablet Take 1 tablet (0.25 mg total) by mouth daily as needed for anxiety.  Marland Kitchen amphetamine-dextroamphetamine (ADDERALL XR) 20 MG 24 hr capsule Take 1 capsule (20 mg total) by mouth every morning.  . furosemide (LASIX) 20 MG tablet Take 2 tablets (40 mg total) by mouth daily as needed (swelling).  Marland Kitchen ibuprofen (ADVIL,MOTRIN) 200 MG tablet Take 400 mg by mouth 2 (two) times daily as needed for headache or moderate pain.  . metFORMIN (GLUCOPHAGE) 500 MG tablet Take 1 tablet (500 mg total) by mouth daily with breakfast.  .  nitroGLYCERIN (NITROSTAT) 0.4 MG SL tablet Place 1 tablet (0.4 mg total) under the tongue every 5 (five) minutes as needed for chest pain.  . Vitamin D, Ergocalciferol, (DRISDOL) 1.25 MG (50000 UT) CAPS capsule Take 1 capsule (50,000 Units total) by mouth every 7 (seven) days.     Allergies:   Adhesive [tape] and Latex   Social History   Tobacco Use  . Smoking status: Never Smoker  . Smokeless tobacco: Never Used  Substance Use Topics  . Alcohol use: No  .  Drug use: No     Family Hx: The patient's family history includes Diabetes in her maternal grandmother, mother, paternal grandmother, and sister; Heart disease in her maternal grandfather, maternal grandmother, maternal uncle, and mother; Heart disease (age of onset: 38) in her father; Hypertension in her mother; Liver disease in her paternal aunt; Obesity in her mother; Ovarian cancer in her maternal grandmother; Sudden death in her father.  ROS:   Please see the history of present illness.     All other systems reviewed and are negative.   Prior CV studies:   The following studies were reviewed today:    Labs/Other Tests and Data Reviewed:    EKG:  No ECG reviewed.  Recent Labs: 11/30/2018: ALT 15; BUN 12; Creatinine, Ser 0.57; Hemoglobin 13.6; Platelets 303; Potassium 4.3; Sodium 140; TSH 4.280   Recent Lipid Panel Lab Results  Component Value Date/Time   CHOL 189 11/30/2018 10:47 AM   TRIG 161 (H) 11/30/2018 10:47 AM   HDL 43 11/30/2018 10:47 AM   CHOLHDL 4.4 11/30/2018 10:47 AM   CHOLHDL 4.2 12/24/2017 08:18 AM   LDLCALC 114 (H) 11/30/2018 10:47 AM   LDLCALC 134 (H) 12/24/2017 08:18 AM    Wt Readings from Last 3 Encounters:  06/20/19 205 lb (93 kg)  06/06/19 205 lb (93 kg)  02/01/19 219 lb (99.3 kg)     Objective:    Vital Signs:  BP (!) 146/94 (BP Location: Right Arm, Patient Position: Sitting, Cuff Size: Normal)   Pulse 82   Temp 97.8 F (36.6 C)   Ht 5\' 2"  (1.575 m)   Wt 205 lb (93 kg)   BMI  37.49 kg/m    No exam available,  Telephone visit   ASSESSMENT & PLAN:    1. Chest pain :   No further episodes of CP.  Continue with exercise program  2.  Sinus tachycardia:   Refill metoprolol at 25 mg po BID.   Needs to exercise .  Follow up in 6 months   3.  Chronic diastolic congestive heart failure: Continue current medications.  I have encouraged her to continue with her weight loss program.  COVID-19 Education: The signs and symptoms of COVID-19 were discussed with the patient and how to seek care for testing (follow up with PCP or arrange E-visit).  The importance of social distancing was discussed today.  Time:   Today, I have spent  24  minutes with the patient with telehealth technology discussing the above problems.     Medication Adjustments/Labs and Tests Ordered: Current medicines are reviewed at length with the patient today.  Concerns regarding medicines are outlined above.   Tests Ordered: No orders of the defined types were placed in this encounter.   Medication Changes: No orders of the defined types were placed in this encounter.   Follow Up:  In Person in 6 month(s)  Signed, Mertie Moores, MD  06/20/2019 9:00 AM    [place]

## 2019-06-23 ENCOUNTER — Other Ambulatory Visit: Payer: Self-pay | Admitting: Family Medicine

## 2019-06-23 MED ORDER — FUROSEMIDE 20 MG PO TABS: 40.0000 mg | ORAL_TABLET | Freq: Every day | ORAL | 2 refills | Status: DC | PRN

## 2019-06-23 MED FILL — FUROSEMIDE 20 MG TABS: 20 | 30 days supply | Qty: 60 | Fill #0

## 2019-06-27 ENCOUNTER — Other Ambulatory Visit: Payer: Self-pay

## 2019-06-27 ENCOUNTER — Encounter (INDEPENDENT_AMBULATORY_CARE_PROVIDER_SITE_OTHER): Payer: Self-pay | Admitting: Family Medicine

## 2019-06-27 ENCOUNTER — Ambulatory Visit (INDEPENDENT_AMBULATORY_CARE_PROVIDER_SITE_OTHER): Payer: 59 | Admitting: Family Medicine

## 2019-06-27 VITALS — BP 132/83 | HR 79 | Temp 98.2°F | Ht 63.0 in | Wt 204.0 lb

## 2019-06-27 DIAGNOSIS — Z9189 Other specified personal risk factors, not elsewhere classified: Secondary | ICD-10-CM | POA: Diagnosis not present

## 2019-06-27 DIAGNOSIS — Z6836 Body mass index (BMI) 36.0-36.9, adult: Secondary | ICD-10-CM

## 2019-06-27 DIAGNOSIS — R7303 Prediabetes: Secondary | ICD-10-CM | POA: Diagnosis not present

## 2019-06-27 DIAGNOSIS — E559 Vitamin D deficiency, unspecified: Secondary | ICD-10-CM | POA: Diagnosis not present

## 2019-06-27 DIAGNOSIS — E7849 Other hyperlipidemia: Secondary | ICD-10-CM | POA: Diagnosis not present

## 2019-06-27 MED ORDER — METFORMIN HCL 500 MG PO TABS: 500.0000 mg | ORAL_TABLET | Freq: Two times a day (BID) | ORAL | 0 refills | Status: DC

## 2019-06-27 MED ORDER — VITAMIN D (ERGOCALCIFEROL) 1.25 MG (50000 UNIT) PO CAPS: 50000.0000 [IU] | ORAL_CAPSULE | ORAL | 0 refills | Status: DC

## 2019-06-28 LAB — COMPREHENSIVE METABOLIC PANEL
ALT: 13 IU/L (ref 0–32)
AST: 13 IU/L (ref 0–40)
Albumin/Globulin Ratio: 1.5 (ref 1.2–2.2)
Albumin: 4 g/dL (ref 3.8–4.8)
Alkaline Phosphatase: 64 IU/L (ref 39–117)
BUN/Creatinine Ratio: 25 — ABNORMAL HIGH (ref 9–23)
BUN: 14 mg/dL (ref 6–24)
Bilirubin Total: <0.2 mg/dL (ref 0.0–1.2)
CO2: 25 mmol/L (ref 20–29)
Calcium: 9.6 mg/dL (ref 8.7–10.2)
Chloride: 99 mmol/L (ref 96–106)
Creatinine, Ser: 0.57 mg/dL (ref 0.57–1.00)
GFR calc Af Amer: 129 mL/min/{1.73_m2} (ref >59)
GFR calc non Af Amer: 112 mL/min/{1.73_m2} (ref >59)
Globulin, Total: 2.6 g/dL (ref 1.5–4.5)
Glucose: 89 mg/dL (ref 65–99)
Potassium: 4.3 mmol/L (ref 3.5–5.2)
Sodium: 138 mmol/L (ref 134–144)
Total Protein: 6.6 g/dL (ref 6.0–8.5)

## 2019-06-28 LAB — HEMOGLOBIN A1C
Est. average glucose Bld gHb Est-mCnc: 108 mg/dL
Hgb A1c MFr Bld: 5.4 % (ref 4.8–5.6)

## 2019-06-28 LAB — LIPID PANEL WITH LDL/HDL RATIO
Cholesterol, Total: 181 mg/dL (ref 100–199)
HDL: 48 mg/dL (ref >39)
LDL Calculated: 106 mg/dL — ABNORMAL HIGH (ref 0–99)
LDl/HDL Ratio: 2.2 ratio (ref 0.0–3.2)
Triglycerides: 135 mg/dL (ref 0–149)
VLDL Cholesterol Cal: 27 mg/dL (ref 5–40)

## 2019-06-28 LAB — VITAMIN D 25 HYDROXY (VIT D DEFICIENCY, FRACTURES): Vit D, 25-Hydroxy: 41.5 ng/mL (ref 30.0–100.0)

## 2019-06-28 LAB — INSULIN, RANDOM: INSULIN: 12.5 u[IU]/mL (ref 2.6–24.9)

## 2019-06-28 MED FILL — VIT D2 1.25 MG (50,000 UNIT: 1.25 MG | 28 days supply | Qty: 4 | Fill #0

## 2019-06-28 NOTE — Progress Notes (Signed)
Office: [phone]  /  Fax: [phone]   HPI:   Chief Complaint: OBESITY [name] is here to discuss her progress with her obesity treatment plan. She is on the Category 3 plan and is following her eating plan approximately 90 to 95 % of the time. She states she is walking 25 minutes 4 times per week. [name] continues to lose weight on Category 3, but she is struggling with hunger issues, especially in the afternoons. [name] has increased stress eating due to increased stress with her son. Her weight is 204 lb (92.5 kg) today and she has had a weight loss of 1 pound over a period of 3 weeks since her last visit. She has lost 31 lbs since starting treatment with [name].  Pre-Diabetes [name] has a diagnosis of prediabetes based on her elevated Hgb A1c and was informed this puts her at greater risk of developing diabetes. [name] is stable on metformin and diet, but she notes increased polyphagia in the afternoons. She continues to work on diet and exercise to decrease risk of diabetes.   At risk for diabetes [name] is at higher than average risk for developing diabetes due to her obesity and prediabetes. She currently denies polyuria or polydipsia.  Vitamin D deficiency [name] has a diagnosis of vitamin D deficiency. She is stable on vitamin D, but she is not yet at goal. [name] denies nausea, vomiting or muscle weakness.  ASSESSMENT AND PLAN:  Prediabetes - Plan: Comprehensive metabolic panel, Hemoglobin A1c, Insulin, random, metFORMIN (GLUCOPHAGE) 500 MG tablet  Vitamin D deficiency - Plan: VITAMIN D 25 Hydroxy (Vit-D Deficiency, Fractures), Vitamin D, Ergocalciferol, (DRISDOL) 1.25 MG (50000 UT) CAPS capsule  Other hyperlipidemia - Plan: Lipid Panel With LDL/HDL Ratio  At risk for diabetes mellitus  Class 2 severe obesity with serious comorbidity and body mass index (BMI) of 36.0 to 36.9 in adult, unspecified obesity type [facility])  PLAN:  Pre-Diabetes [name] will continue to  work on weight loss, exercise, and decreasing simple carbohydrates in her diet to help decrease the risk of diabetes. We dicussed metformin including benefits and risks. She was informed that eating too many simple carbohydrates or too many calories at one sitting increases the likelihood of GI side effects. We will check labs today and [name] agrees to increase metformin to 500 mg two times daily #60 with no refills and follow up with [name] as directed to monitor her progress.  Diabetes risk counseling [name] was given extended (15 minutes) diabetes prevention counseling today. She is 48 y.o. female and has risk factors for diabetes including obesity and prediabetes. We discussed intensive lifestyle modifications today with an emphasis on weight loss as well as increasing exercise and decreasing simple carbohydrates in her diet.  Vitamin D Deficiency [name] was informed that low vitamin D levels contributes to fatigue and are associated with obesity, breast, and colon cancer. She agrees to continue to take prescription Vit D @50 ,000 IU every week #4 with no refills and will follow up for routine testing of vitamin D, at least 2-3 times per year. She was informed of the risk of over-replacement of vitamin D and agrees to not increase her dose unless she discusses this with [name] first. We will check labs and [name] agrees to follow up as directed.  Obesity [name] is currently in the action stage of change. As such, her goal is to continue with weight loss efforts She has agreed to follow the Category 3 plan [name] has been instructed to work up  to a goal of 150 minutes of combined cardio and strengthening exercise per week for weight loss and overall health benefits. We discussed the following Behavioral Modification Strategies today: increasing lean protein intake, decreasing simple carbohydrates , increasing vegetables and work on meal planning and easy cooking plans   [name] has agreed to follow  up with our clinic in 2 weeks. She was informed of the importance of frequent follow up visits to maximize her success with intensive lifestyle modifications for her multiple health conditions.  ALLERGIES: Allergies  Allergen Reactions  . Adhesive [Tape] Rash  . Latex Rash    MEDICATIONS: Current Outpatient Medications on File Prior to Visit  Medication Sig Dispense Refill  . albuterol (PROVENTIL HFA;VENTOLIN HFA) 108 (90 Base) MCG/ACT inhaler Inhale 2 puffs into the lungs every 6 (six) hours as needed for wheezing. 1 Inhaler 11  . ALPRAZolam (XANAX) 0.25 MG tablet Take 1 tablet (0.25 mg total) by mouth daily as needed for anxiety. 30 tablet 2  . amphetamine-dextroamphetamine (ADDERALL XR) 20 MG 24 hr capsule Take 1 capsule (20 mg total) by mouth every morning. 30 capsule 0  . furosemide (LASIX) 20 MG tablet Take 2 tablets (40 mg total) by mouth daily as needed (swelling). 60 tablet 2  . ibuprofen (ADVIL,MOTRIN) 200 MG tablet Take 400 mg by mouth 2 (two) times daily as needed for headache or moderate pain.    . metoprolol tartrate (LOPRESSOR) 25 MG tablet Take 1 tablet (25 mg total) by mouth 2 (two) times daily. 180 tablet 3  . nitroGLYCERIN (NITROSTAT) 0.4 MG SL tablet Place 1 tablet (0.4 mg total) under the tongue every 5 (five) minutes as needed for chest pain. 25 tablet 3   No current facility-administered medications on file prior to visit.     PAST MEDICAL HISTORY: Past Medical History:  Diagnosis Date  . Anxiety   . Asthma   . Back pain   . Chest pain   . Chronic CHF ([place])   . Dyspnea   . Echocardiogram 10/25/2015   EF 60-65, normal wall motion, grade 1 diastolic dysfunction, GLS -19.1%  . Edema of left lower extremity   . Fracture dislocation of elbow joint 01/15/2013   right  . GERD (gastroesophageal reflux disease)    Omeprazole as needed, none in 6 mos.  . Hyperlipidemia   . Hypertension   . Joint pain   . Obesity   . Palpitations   . Prediabetes   . R/L Cardiac  catheterization 08/18/2018   Normal coronary arteries, mean RA 6, mean PA 17, PCWP 10  . Seborrheic dermatitis   . Sleep apnea    uses CPAP "most nights"    PAST SURGICAL HISTORY: Past Surgical History:  Procedure Laterality Date  . [place]; 10/10/2000   x 2  . KNEE ARTHROSCOPY Left 1991   also in 2019  . LAPAROSCOPIC ASSISTED VAGINAL HYSTERECTOMY  11/02/2006  . LAPAROSCOPIC LYSIS OF ADHESIONS  03/06/2005  . RADIAL HEAD ARTHROPLASTY Right 01/17/2013   Procedure: RIGHT RADIAL HEAD ARTHROPLASTY OPEN REDUCTION INTERNAL FIXATION CORONOID ;  Surgeon: Tennis Must, MD;  Location: [place];  Service: Orthopedics;  Laterality: Right;  . RIGHT/LEFT HEART CATH AND CORONARY ANGIOGRAPHY N/A 08/18/2018   Procedure: RIGHT/LEFT HEART CATH AND CORONARY ANGIOGRAPHY;  Surgeon: Nelva Bush, MD;  Location: [place] [facility];  Service: Cardiovascular;  Laterality: N/A;  . TUBAL LIGATION  03/06/2005    SOCIAL HISTORY: Social History   Tobacco Use  .  Smoking status: Never Smoker  . Smokeless tobacco: Never Used  Substance Use Topics  . Alcohol use: No  . Drug use: No    FAMILY HISTORY: Family History  Problem Relation Age of Onset  . Diabetes Sister   . Heart disease Father 65       MI--1st Dx CAD. Died2that time  . Sudden death Father   . Diabetes Mother   . Hypertension Mother   . Heart disease Mother   . Obesity Mother   . Heart disease Maternal Uncle   . Ovarian cancer Maternal Grandmother   . Diabetes Maternal Grandmother   . Heart disease Maternal Grandmother        Event organiser  . Diabetes Paternal Grandmother   . Liver disease Paternal Aunt   . Heart disease Maternal Grandfather     ROS: Review of Systems  Gastrointestinal: Negative for nausea and vomiting.  Genitourinary: Negative for frequency.  Musculoskeletal:       Negative for muscle weakness  Endo/Heme/Allergies: Negative for polydipsia.       Positive for polyphagia     PHYSICAL EXAM: Blood pressure 132/83, pulse 79, temperature 98.2 F (36.8 C), temperature source Oral, height 5\' 3"  (1.6 m), weight 204 lb (92.5 kg), SpO2 98 %. Body mass index is 36.14 kg/m. Physical Exam Vitals signs reviewed.  Constitutional:      Appearance: Normal appearance. She is well-developed. She is obese.  Cardiovascular:     Rate and Rhythm: Normal rate.  Pulmonary:     Effort: Pulmonary effort is normal.  Musculoskeletal: Normal range of motion.  Skin:    General: Skin is warm and dry.  Neurological:     Mental Status: She is alert and oriented to person, place, and time.  Psychiatric:        Mood and Affect: Mood normal.        Behavior: Behavior normal.     RECENT LABS AND TESTS: BMET    Component Value Date/Time   NA 138 06/27/2019 1015   K 4.3 06/27/2019 1015   CL 99 06/27/2019 1015   CO2 25 06/27/2019 1015   GLUCOSE 89 06/27/2019 1015   GLUCOSE 97 12/24/2017 0818   BUN 14 06/27/2019 1015   CREATININE 0.57 06/27/2019 1015   CREATININE 0.63 12/24/2017 0818   CALCIUM 9.6 06/27/2019 1015   GFRNONAA 112 06/27/2019 1015   GFRNONAA 109 12/24/2017 0818   GFRAA 129 06/27/2019 1015   GFRAA 126 12/24/2017 0818   Lab Results  Component Value Date   HGBA1C 5.4 06/27/2019   HGBA1C 6.0 (H) 11/30/2018   Lab Results  Component Value Date   INSULIN 12.5 06/27/2019   INSULIN 14.5 11/30/2018   CBC    Component Value Date/Time   WBC 9.8 11/30/2018 1047   WBC 9.6 12/24/2017 0818   RBC 4.51 11/30/2018 1047   RBC 5.06 12/24/2017 0818   HGB 13.6 11/30/2018 1047   HCT 39.1 11/30/2018 1047   PLT 303 11/30/2018 1047   MCV 87 11/30/2018 1047   MCH 30.2 11/30/2018 1047   MCH 29.2 12/24/2017 0818   MCHC 34.8 11/30/2018 1047   MCHC 33.4 12/24/2017 0818   RDW 12.9 11/30/2018 1047   LYMPHSABS 3.4 (H) 11/30/2018 1047   MONOABS 690 07/23/2016 1109   EOSABS 0.1 11/30/2018 1047   BASOSABS 0.0 11/30/2018 1047   Iron/TIBC/Ferritin/ %Sat No results found for:  IRON, TIBC, FERRITIN, IRONPCTSAT Lipid Panel     Component Value Date/Time   CHOL 181  06/27/2019 1015   TRIG 135 06/27/2019 1015   HDL 48 06/27/2019 1015   CHOLHDL 4.4 11/30/2018 1047   CHOLHDL 4.2 12/24/2017 0818   VLDL 22 03/11/2015 0847   LDLCALC 106 (H) 06/27/2019 1015   LDLCALC 134 (H) 12/24/2017 0818   Hepatic Function Panel     Component Value Date/Time   PROT 6.6 06/27/2019 1015   ALBUMIN 4.0 06/27/2019 1015   AST 13 06/27/2019 1015   ALT 13 06/27/2019 1015   ALKPHOS 64 06/27/2019 1015   BILITOT <0.2 06/27/2019 1015      Component Value Date/Time   TSH 4.280 11/30/2018 1047   TSH 3.21 12/24/2017 0818   TSH 3.24 10/01/2016 1643     Ref. Range 11/30/2018 10:47  Vitamin D, 25-Hydroxy Latest Ref Range: 30.0 - 100.0 ng/mL 7.8 (L)    OBESITY BEHAVIORAL INTERVENTION VISIT  Today's visit was # 13   Starting weight: 235 lbs Starting date: 11/30/2018 Today's weight : 204 lbs Today's date: 06/27/2019 Total lbs lost to date: 31    06/27/2019  Height 5\' 3"  (1.6 m)  Weight 204 lb (92.5 kg)  BMI (Calculated) 36.15  BLOOD PRESSURE - SYSTOLIC 517  BLOOD PRESSURE - DIASTOLIC 83   Body Fat % 61.6 %  Total Body Water (lbs) 88.2 lbs    ASK: We discussed the diagnosis of obesity with [name] today and [name] agreed to give [name] permission to discuss obesity behavioral modification therapy today.  ASSESS: [name] has the diagnosis of obesity and her BMI today is 36.15 [name] is in the action stage of change   ADVISE: [name] was educated on the multiple health risks of obesity as well as the benefit of weight loss to improve her health. She was advised of the need for long term treatment and the importance of lifestyle modifications to improve her current health and to decrease her risk of future health problems.  AGREE: Multiple dietary modification options and treatment options were discussed and  [name] agreed to follow the recommendations documented in the  above note.  ARRANGE: [name] was educated on the importance of frequent visits to treat obesity as outlined per CMS and USPSTF guidelines and agreed to schedule her next follow up appointment today.  I, Doreene Nest, am acting as transcriptionist for Dennard Nip, MD I have reviewed the above documentation for accuracy and completeness, and I agree with the above. -Dennard Nip, MD

## 2019-06-29 ENCOUNTER — Telehealth: Payer: Self-pay | Admitting: Family Medicine

## 2019-06-29 ENCOUNTER — Other Ambulatory Visit: Payer: Self-pay | Admitting: Family Medicine

## 2019-06-29 MED ORDER — AMPHETAMINE-DEXTROAMPHET ER 25 MG PO CP24: 25.0000 mg | ORAL_CAPSULE | ORAL | 0 refills | Status: DC

## 2019-06-29 MED FILL — ADDERALL XR 25 MG CAPSULE: 25 | 30 days supply | Qty: 30 | Fill #0

## 2019-06-29 NOTE — Telephone Encounter (Signed)
Pt aware.

## 2019-06-29 NOTE — Telephone Encounter (Signed)
Pt is needing a refill on her Adderall and was wondering if you could increase the dose. She has a lot going on and is having a very hard time concentrating at work. She will come in for an ov if needed.

## 2019-06-29 NOTE — Telephone Encounter (Signed)
Will increase to adderall xr 25 mg poqam.

## 2019-06-30 MED FILL — metFORMIN HCL 500 MG TABS: 500 | 30 days supply | Qty: 60 | Fill #0

## 2019-07-11 ENCOUNTER — Ambulatory Visit (INDEPENDENT_AMBULATORY_CARE_PROVIDER_SITE_OTHER): Payer: 59 | Admitting: Family Medicine

## 2019-07-11 ENCOUNTER — Encounter (INDEPENDENT_AMBULATORY_CARE_PROVIDER_SITE_OTHER): Payer: Self-pay | Admitting: Family Medicine

## 2019-07-11 ENCOUNTER — Other Ambulatory Visit: Payer: Self-pay

## 2019-07-11 VITALS — BP 133/82 | HR 88 | Temp 98.0°F | Ht 63.0 in | Wt 200.0 lb

## 2019-07-11 DIAGNOSIS — Z9189 Other specified personal risk factors, not elsewhere classified: Secondary | ICD-10-CM | POA: Diagnosis not present

## 2019-07-11 DIAGNOSIS — Z6835 Body mass index (BMI) 35.0-35.9, adult: Secondary | ICD-10-CM

## 2019-07-11 DIAGNOSIS — E559 Vitamin D deficiency, unspecified: Secondary | ICD-10-CM | POA: Diagnosis not present

## 2019-07-11 DIAGNOSIS — R7303 Prediabetes: Secondary | ICD-10-CM

## 2019-07-12 NOTE — Progress Notes (Signed)
Office: [phone]  /  Fax: [phone]   HPI:   Chief Complaint: OBESITY [name] is here to discuss her progress with her obesity treatment plan. She is on the Category 3 plan and is following her eating plan approximately 90-95 % of the time. She states she is walking for 25 minutes 3 times per week. [name] continues to do well with her Category 3 plan. She still struggles to eat all her protein at dinner. Her hunger has improved and she is happy overall with her plan.  Her weight is 200 lb (90.7 kg) today and has had a weight loss of 4 pounds over a period of 2 weeks since her last visit. She has lost 35 lbs since starting treatment with [name].  Pre-Diabetes [name] has a diagnosis of pre-diabetes based on her elevated Hgb A1c and was informed this puts her at greater risk of developing diabetes. Last A1c greatly improved from 6.0 to 5.4. She denies nausea or vomiting, and notes decreased polyphagia. She is taking metformin currently and continues to work on diet and exercise to decrease risk of diabetes.   At risk for diabetes [name] is at higher than average risk for developing diabetes due to her obesity and pre-diabetes. She currently denies polyuria or polydipsia.  Vitamin D Deficiency [name] has a diagnosis of vitamin D deficiency. Her Vit D level has greatly improved on prescription Vit D, but level is not yet at goal. She notes fatigue is improving and denies nausea, vomiting or muscle weakness.  ASSESSMENT AND PLAN:  Prediabetes  Vitamin D deficiency  At risk for diabetes mellitus  Class 2 severe obesity with serious comorbidity and body mass index (BMI) of 35.0 to 35.9 in adult, unspecified obesity type [name])  PLAN:  Pre-Diabetes [name] will continue to work on weight loss, exercise, and decreasing simple carbohydrates in her diet to help decrease the risk of diabetes. We dicussed metformin including benefits and risks. She was informed that eating too many simple  carbohydrates or too many calories at one sitting increases the likelihood of GI side effects. [name] agrees to continue taking metformin and will continue with diet. We will recheck labs in 3 months. [name] agrees to follow up with our clinic in 2 to 3 weeks as directed to monitor her progress.  Diabetes risk counseling [name] was given extended (15 minutes) diabetes prevention counseling today. She is 48 y.o. female and has risk factors for diabetes including obesity and pre-diabetes. We discussed intensive lifestyle modifications today with an emphasis on weight loss as well as increasing exercise and decreasing simple carbohydrates in her diet.  Vitamin D Deficiency [name] was informed that low vitamin D levels contributes to fatigue and are associated with obesity, breast, and colon cancer. [name] agrees to continue taking prescription Vit D 50,000 IU every week, as is. She will follow up for routine testing of vitamin D, at least 2-3 times per year. She was informed of the risk of over-replacement of vitamin D and agrees to not increase her dose unless she discusses this with [name] first. We will recheck labs in 3 months. [name] agrees to follow up with our clinic in 2 to 3 weeks.  Obesity [name] is currently in the action stage of change. As such, her goal is to continue with weight loss efforts She has agreed to follow the Category 3 plan + lean meat equivalents [name] has been instructed to work up to a goal of 150 minutes of combined cardio and strengthening exercise  per week for weight loss and overall health benefits. We discussed the following Behavioral Modification Strategies today: increasing lean protein intake, decreasing simple carbohydrates, and better snacking choices    [name] has agreed to follow up with our clinic in 2 to 3 weeks. She was informed of the importance of frequent follow up visits to maximize her success with intensive lifestyle modifications for her  multiple health conditions.  ALLERGIES: Allergies  Allergen Reactions  . Adhesive [Tape] Rash  . Latex Rash    MEDICATIONS: Current Outpatient Medications on File Prior to Visit  Medication Sig Dispense Refill  . albuterol (PROVENTIL HFA;VENTOLIN HFA) 108 (90 Base) MCG/ACT inhaler Inhale 2 puffs into the lungs every 6 (six) hours as needed for wheezing. 1 Inhaler 11  . ALPRAZolam (XANAX) 0.25 MG tablet Take 1 tablet (0.25 mg total) by mouth daily as needed for anxiety. 30 tablet 2  . amphetamine-dextroamphetamine (ADDERALL XR) 25 MG 24 hr capsule Take 1 capsule by mouth every morning. 30 capsule 0  . furosemide (LASIX) 20 MG tablet Take 2 tablets (40 mg total) by mouth daily as needed (swelling). 60 tablet 2  . ibuprofen (ADVIL,MOTRIN) 200 MG tablet Take 400 mg by mouth 2 (two) times daily as needed for headache or moderate pain.    . metFORMIN (GLUCOPHAGE) 500 MG tablet Take 1 tablet (500 mg total) by mouth 2 (two) times daily with a meal. 60 tablet 0  . metoprolol tartrate (LOPRESSOR) 25 MG tablet Take 1 tablet (25 mg total) by mouth 2 (two) times daily. 180 tablet 3  . nitroGLYCERIN (NITROSTAT) 0.4 MG SL tablet Place 1 tablet (0.4 mg total) under the tongue every 5 (five) minutes as needed for chest pain. 25 tablet 3  . Vitamin D, Ergocalciferol, (DRISDOL) 1.25 MG (50000 UT) CAPS capsule Take 1 capsule (50,000 Units total) by mouth every 7 (seven) days. 4 capsule 0   No current facility-administered medications on file prior to visit.     PAST MEDICAL HISTORY: Past Medical History:  Diagnosis Date  . Anxiety   . Asthma   . Back pain   . Chest pain   . Chronic CHF ([place])   . Dyspnea   . Echocardiogram 10/25/2015   EF 60-65, normal wall motion, grade 1 diastolic dysfunction, GLS -19.1%  . Edema of left lower extremity   . Fracture dislocation of elbow joint 01/15/2013   right  . GERD (gastroesophageal reflux disease)    Omeprazole as needed, none in 6 mos.  . Hyperlipidemia    . Hypertension   . Joint pain   . Obesity   . Palpitations   . Prediabetes   . R/L Cardiac catheterization 08/18/2018   Normal coronary arteries, mean RA 6, mean PA 17, PCWP 10  . Seborrheic dermatitis   . Sleep apnea    uses CPAP "most nights"    PAST SURGICAL HISTORY: Past Surgical History:  Procedure Laterality Date  . [place]; 10/10/2000   x 2  . KNEE ARTHROSCOPY Left 1991   also in 2019  . LAPAROSCOPIC ASSISTED VAGINAL HYSTERECTOMY  11/02/2006  . LAPAROSCOPIC LYSIS OF ADHESIONS  03/06/2005  . RADIAL HEAD ARTHROPLASTY Right 01/17/2013   Procedure: RIGHT RADIAL HEAD ARTHROPLASTY OPEN REDUCTION INTERNAL FIXATION CORONOID ;  Surgeon: Tennis Must, MD;  Location: [facility];  Service: Orthopedics;  Laterality: Right;  . RIGHT/LEFT HEART CATH AND CORONARY ANGIOGRAPHY N/A 08/18/2018   Procedure: RIGHT/LEFT HEART CATH AND CORONARY ANGIOGRAPHY;  Surgeon: End,  Harrell Gave, MD;  Location: [facility];  Service: Cardiovascular;  Laterality: N/A;  . TUBAL LIGATION  03/06/2005    SOCIAL HISTORY: Social History   Tobacco Use  . Smoking status: Never Smoker  . Smokeless tobacco: Never Used  Substance Use Topics  . Alcohol use: No  . Drug use: No    FAMILY HISTORY: Family History  Problem Relation Age of Onset  . Diabetes Sister   . Heart disease Father 33       MI--1st Dx CAD. Died2that time  . Sudden death Father   . Diabetes Mother   . Hypertension Mother   . Heart disease Mother   . Obesity Mother   . Heart disease Maternal Uncle   . Ovarian cancer Maternal Grandmother   . Diabetes Maternal Grandmother   . Heart disease Maternal Grandmother        Event organiser  . Diabetes Paternal Grandmother   . Liver disease Paternal Aunt   . Heart disease Maternal Grandfather     ROS: Review of Systems  Constitutional: Positive for malaise/fatigue and weight loss.  Gastrointestinal: Negative for nausea and vomiting.  Genitourinary:  Negative for frequency.  Musculoskeletal:       Negative muscle weakness  Endo/Heme/Allergies: Negative for polydipsia.       Positive polyphagia    PHYSICAL EXAM: Blood pressure 133/82, pulse 88, temperature 98 F (36.7 C), temperature source Oral, height 5\' 3"  (1.6 m), weight 200 lb (90.7 kg), SpO2 98 %. Body mass index is 35.43 kg/m. Physical Exam Vitals signs reviewed.  Constitutional:      Appearance: Normal appearance. She is obese.  Cardiovascular:     Rate and Rhythm: Normal rate.     Pulses: Normal pulses.  Pulmonary:     Effort: Pulmonary effort is normal.     Breath sounds: Normal breath sounds.  Musculoskeletal: Normal range of motion.  Skin:    General: Skin is warm and dry.  Neurological:     Mental Status: She is alert and oriented to person, place, and time.  Psychiatric:        Mood and Affect: Mood normal.        Behavior: Behavior normal.     RECENT LABS AND TESTS: BMET    Component Value Date/Time   NA 138 06/27/2019 1015   K 4.3 06/27/2019 1015   CL 99 06/27/2019 1015   CO2 25 06/27/2019 1015   GLUCOSE 89 06/27/2019 1015   GLUCOSE 97 12/24/2017 0818   BUN 14 06/27/2019 1015   CREATININE 0.57 06/27/2019 1015   CREATININE 0.63 12/24/2017 0818   CALCIUM 9.6 06/27/2019 1015   GFRNONAA 112 06/27/2019 1015   GFRNONAA 109 12/24/2017 0818   GFRAA 129 06/27/2019 1015   GFRAA 126 12/24/2017 0818   Lab Results  Component Value Date   HGBA1C 5.4 06/27/2019   HGBA1C 6.0 (H) 11/30/2018   Lab Results  Component Value Date   INSULIN 12.5 06/27/2019   INSULIN 14.5 11/30/2018   CBC    Component Value Date/Time   WBC 9.8 11/30/2018 1047   WBC 9.6 12/24/2017 0818   RBC 4.51 11/30/2018 1047   RBC 5.06 12/24/2017 0818   HGB 13.6 11/30/2018 1047   HCT 39.1 11/30/2018 1047   PLT 303 11/30/2018 1047   MCV 87 11/30/2018 1047   MCH 30.2 11/30/2018 1047   MCH 29.2 12/24/2017 0818   MCHC 34.8 11/30/2018 1047   MCHC 33.4 12/24/2017 0818   RDW 12.9  11/30/2018 1047   LYMPHSABS 3.4 (H) 11/30/2018 1047   MONOABS 690 07/23/2016 1109   EOSABS 0.1 11/30/2018 1047   BASOSABS 0.0 11/30/2018 1047   Iron/TIBC/Ferritin/ %Sat No results found for: IRON, TIBC, FERRITIN, IRONPCTSAT Lipid Panel     Component Value Date/Time   CHOL 181 06/27/2019 1015   TRIG 135 06/27/2019 1015   HDL 48 06/27/2019 1015   CHOLHDL 4.4 11/30/2018 1047   CHOLHDL 4.2 12/24/2017 0818   VLDL 22 03/11/2015 0847   LDLCALC 106 (H) 06/27/2019 1015   LDLCALC 134 (H) 12/24/2017 0818   Hepatic Function Panel     Component Value Date/Time   PROT 6.6 06/27/2019 1015   ALBUMIN 4.0 06/27/2019 1015   AST 13 06/27/2019 1015   ALT 13 06/27/2019 1015   ALKPHOS 64 06/27/2019 1015   BILITOT <0.2 06/27/2019 1015      Component Value Date/Time   TSH 4.280 11/30/2018 1047   TSH 3.21 12/24/2017 0818   TSH 3.24 10/01/2016 1643      OBESITY BEHAVIORAL INTERVENTION VISIT  Today's visit was # 14   Starting weight: 235 lbs Starting date: 11/30/2018 Today's weight : 200 lbs Today's date: 07/11/2019 Total lbs lost to date: 33    ASK: We discussed the diagnosis of obesity with Maureen Chatters today and Annai agreed to give [name] permission to discuss obesity behavioral modification therapy today.  ASSESS: Anycia has the diagnosis of obesity and her BMI today is 35.44 Dynasia is in the action stage of change   ADVISE: Pauline was educated on the multiple health risks of obesity as well as the benefit of weight loss to improve her health. She was advised of the need for long term treatment and the importance of lifestyle modifications to improve her current health and to decrease her risk of future health problems.  AGREE: Multiple dietary modification options and treatment options were discussed and  Chirstine agreed to follow the recommendations documented in the above note.  ARRANGE: Yvon was educated on the importance of frequent visits to treat obesity as  outlined per CMS and USPSTF guidelines and agreed to schedule her next follow up appointment today.  I, Trixie Dredge, am acting as transcriptionist for Dennard Nip, MD  I have reviewed the above documentation for accuracy and completeness, and I agree with the above. -Dennard Nip, MD

## 2019-07-18 ENCOUNTER — Encounter: Payer: Self-pay | Admitting: Family Medicine

## 2019-07-27 ENCOUNTER — Other Ambulatory Visit: Payer: Self-pay

## 2019-07-27 ENCOUNTER — Ambulatory Visit (INDEPENDENT_AMBULATORY_CARE_PROVIDER_SITE_OTHER): Payer: 59 | Admitting: Family Medicine

## 2019-07-27 ENCOUNTER — Encounter (INDEPENDENT_AMBULATORY_CARE_PROVIDER_SITE_OTHER): Payer: Self-pay | Admitting: Family Medicine

## 2019-07-27 VITALS — BP 116/76 | HR 85 | Temp 98.4°F | Ht 63.0 in | Wt 199.0 lb

## 2019-07-27 DIAGNOSIS — K5909 Other constipation: Secondary | ICD-10-CM | POA: Diagnosis not present

## 2019-07-27 DIAGNOSIS — Z9189 Other specified personal risk factors, not elsewhere classified: Secondary | ICD-10-CM | POA: Diagnosis not present

## 2019-07-27 DIAGNOSIS — R7303 Prediabetes: Secondary | ICD-10-CM

## 2019-07-27 DIAGNOSIS — E559 Vitamin D deficiency, unspecified: Secondary | ICD-10-CM | POA: Diagnosis not present

## 2019-07-27 DIAGNOSIS — Z6835 Body mass index (BMI) 35.0-35.9, adult: Secondary | ICD-10-CM | POA: Diagnosis not present

## 2019-07-27 MED ORDER — VITAMIN D (ERGOCALCIFEROL) 1.25 MG (50000 UNIT) PO CAPS: 50000.0000 [IU] | ORAL_CAPSULE | ORAL | 0 refills | Status: DC

## 2019-07-27 MED ORDER — METFORMIN HCL 500 MG PO TABS: 500.0000 mg | ORAL_TABLET | Freq: Two times a day (BID) | ORAL | 0 refills | Status: DC

## 2019-07-27 MED FILL — VIT D2 1.25 MG (50,000 UNIT: 1.25 MG | 28 days supply | Qty: 4 | Fill #0

## 2019-07-27 MED FILL — metFORMIN HCL 500 MG TABS: 500 | 30 days supply | Qty: 60 | Fill #0

## 2019-07-31 NOTE — Progress Notes (Signed)
Office: [phone]  /  Fax: [phone]   HPI:   Chief Complaint: OBESITY [name] is here to discuss her progress with her obesity treatment plan. She is on the Category 3 plan and is following her eating plan approximately 90 to 95 % of the time. She states she is walking 20 to 25 minutes 3 to 4 times per week. [name] continues to do well with weight loss, and she is now under 200 pounds, which makes her happy. She states hunger is controlled and she is happy with her plan. Her weight is 199 lb (90.3 kg) today and has had a weight loss of 1 pound over a period of 2 weeks since her last visit. She has lost 36 lbs since starting treatment with [name].  Pre-Diabetes [name] has a diagnosis of prediabetes based on her elevated Hgb A1c and was informed this puts her at greater risk of developing diabetes. She is stable on metformin and diet. Her A1c is improving. She continues to work on diet and exercise to decrease risk of diabetes. She denies nausea or hypoglycemia.  At risk for diabetes [name] is at higher than average risk for developing diabetes due to her obesity and prediabetes. She currently denies polyuria or polydipsia.  Vitamin D deficiency [name] has a diagnosis of vitamin D deficiency. She is stable on vit D, but she is not at goal. [name] denies nausea, vomiting or muscle weakness.  Constipation [name] notes increased abdominal cramping and less frequent BMs. She has taken Miralax, but not regularly.   ASSESSMENT AND PLAN:  Prediabetes - Plan: metFORMIN (GLUCOPHAGE) 500 MG tablet  Vitamin D deficiency - Plan: Vitamin D, Ergocalciferol, (DRISDOL) 1.25 MG (50000 UT) CAPS capsule  Other constipation  At risk for diabetes mellitus  Class 2 severe obesity with serious comorbidity and body mass index (BMI) of 35.0 to 35.9 in adult, unspecified obesity type [name])  PLAN:  Pre-Diabetes [name] will continue to work on weight loss, exercise, and decreasing simple  carbohydrates in her diet to help decrease the risk of diabetes. We dicussed metformin including benefits and risks. She was informed that eating too many simple carbohydrates or too many calories at one sitting increases the likelihood of GI side effects. Sha agreed to continue metformin 500 mg two times daily with a meal #60 with no refills and follow up with [name] as directed to monitor her progress.  Diabetes risk counseling [name] was given extended (15 minutes) diabetes prevention counseling today. She is 48 y.o. female and has risk factors for diabetes including obesity and prediabetes. We discussed intensive lifestyle modifications today with an emphasis on weight loss as well as increasing exercise and decreasing simple carbohydrates in her diet.  Vitamin D Deficiency [name] was informed that low vitamin D levels contributes to fatigue and are associated with obesity, breast, and colon cancer. [name] agrees to continue to take prescription Vit D @50 ,000 IU every week #4 with no refills and she will follow up for routine testing of vitamin D, at least 2-3 times per year. She was informed of the risk of over-replacement of vitamin D and agrees to not increase her dose unless she discusses this with [name] first. [name] agrees to follow up with our clinic in 2 to 3 weeks.  Constipation [name] was informed decrease bowel movement frequency is normal while losing weight, but stools should not be hard or painful. She was advised to increase her H20 intake and increase Miralax to two times daily until the next  BM, and then daily thereafter. If no improvement, we will refer to GI for evaluation.   Obesity [name] is currently in the action stage of change. As such, her goal is to continue with weight loss efforts She has agreed to follow the Category 3 plan [name] has been instructed to work up to a goal of 150 minutes of combined cardio and strengthening exercise per week for weight loss and  overall health benefits. We discussed the following Behavioral Modification Strategies today: increasing lean protein intake and decreasing simple carbohydrates   [name] has agreed to follow up with our clinic in 2 to 3 weeks. She was informed of the importance of frequent follow up visits to maximize her success with intensive lifestyle modifications for her multiple health conditions.  ALLERGIES: Allergies  Allergen Reactions   Adhesive [Tape] Rash   Latex Rash    MEDICATIONS: Current Outpatient Medications on File Prior to Visit  Medication Sig Dispense Refill   albuterol (PROVENTIL HFA;VENTOLIN HFA) 108 (90 Base) MCG/ACT inhaler Inhale 2 puffs into the lungs every 6 (six) hours as needed for wheezing. 1 Inhaler 11   ALPRAZolam (XANAX) 0.25 MG tablet Take 1 tablet (0.25 mg total) by mouth daily as needed for anxiety. 30 tablet 2   amphetamine-dextroamphetamine (ADDERALL XR) 25 MG 24 hr capsule Take 1 capsule by mouth every morning. 30 capsule 0   furosemide (LASIX) 20 MG tablet Take 2 tablets (40 mg total) by mouth daily as needed (swelling). 60 tablet 2   ibuprofen (ADVIL,MOTRIN) 200 MG tablet Take 400 mg by mouth 2 (two) times daily as needed for headache or moderate pain.     metoprolol tartrate (LOPRESSOR) 25 MG tablet Take 1 tablet (25 mg total) by mouth 2 (two) times daily. 180 tablet 3   nitroGLYCERIN (NITROSTAT) 0.4 MG SL tablet Place 1 tablet (0.4 mg total) under the tongue every 5 (five) minutes as needed for chest pain. 25 tablet 3   No current facility-administered medications on file prior to visit.     PAST MEDICAL HISTORY: Past Medical History:  Diagnosis Date   Anxiety    Asthma    Back pain    Chest pain    Chronic CHF ([facility])    Dyspnea    Echocardiogram 10/25/2015   EF 60-65, normal wall motion, grade 1 diastolic dysfunction, GLS -19.1%   Edema of left lower extremity    Fracture dislocation of elbow joint 01/15/2013   right   GERD  (gastroesophageal reflux disease)    Omeprazole as needed, none in 6 mos.   Hyperlipidemia    Hypertension    Joint pain    Obesity    Palpitations    Prediabetes    R/L Cardiac catheterization 08/18/2018   Normal coronary arteries, mean RA 6, mean PA 17, PCWP 10   Seborrheic dermatitis    Sleep apnea    uses CPAP "most nights"    PAST SURGICAL HISTORY: Past Surgical History:  Procedure Laterality Date   CESAREAN SECTION  1998; 10/10/2000   x 2   KNEE ARTHROSCOPY Left 1991   also in 2019   [place]  11/02/2006   LAPAROSCOPIC LYSIS OF ADHESIONS  03/06/2005   RADIAL HEAD ARTHROPLASTY Right 01/17/2013   Procedure: RIGHT RADIAL HEAD ARTHROPLASTY OPEN REDUCTION INTERNAL FIXATION CORONOID ;  Surgeon: Tennis Must, MD;  Location: [facility];  Service: Orthopedics;  Laterality: Right;   RIGHT/LEFT HEART CATH AND CORONARY ANGIOGRAPHY N/A 08/18/2018  Procedure: RIGHT/LEFT HEART CATH AND CORONARY ANGIOGRAPHY;  Surgeon: Nelva Bush, MD;  Location: [facility];  Service: Cardiovascular;  Laterality: N/A;   TUBAL LIGATION  03/06/2005    SOCIAL HISTORY: Social History   Tobacco Use   Smoking status: Never Smoker   Smokeless tobacco: Never Used  Substance Use Topics   Alcohol use: No   Drug use: No    FAMILY HISTORY: Family History  Problem Relation Age of Onset   Diabetes Sister    Heart disease Father 59       MI--1st Dx CAD. Died2that time   Sudden death Father    Diabetes Mother    Hypertension Mother    Heart disease Mother    Obesity Mother    Heart disease Maternal Uncle    Ovarian cancer Maternal Grandmother    Diabetes Maternal Grandmother    Heart disease Maternal Grandmother        Great Grandmother   Diabetes Paternal Grandmother    Liver disease Paternal Aunt    Heart disease Maternal Grandfather     ROS: Review of Systems  Constitutional: Positive for weight loss.   Gastrointestinal: Positive for constipation. Negative for nausea and vomiting.       Positive for abdominal cramping  Genitourinary: Negative for frequency.  Musculoskeletal:       Negative for muscle weakness  Endo/Heme/Allergies: Negative for polydipsia.       Negative for hypoglycemia    PHYSICAL EXAM: Blood pressure 116/76, pulse 85, temperature 98.4 F (36.9 C), temperature source Oral, height 5\' 3"  (1.6 m), weight 199 lb (90.3 kg), SpO2 98 %. Body mass index is 35.25 kg/m. Physical Exam Vitals signs reviewed.  Constitutional:      Appearance: Normal appearance. She is well-developed. She is obese.  Cardiovascular:     Rate and Rhythm: Normal rate.  Pulmonary:     Effort: Pulmonary effort is normal.  Musculoskeletal: Normal range of motion.  Skin:    General: Skin is warm and dry.  Neurological:     Mental Status: She is alert and oriented to person, place, and time.  Psychiatric:        Mood and Affect: Mood normal.        Behavior: Behavior normal.     RECENT LABS AND TESTS: BMET    Component Value Date/Time   NA 138 06/27/2019 1015   K 4.3 06/27/2019 1015   CL 99 06/27/2019 1015   CO2 25 06/27/2019 1015   GLUCOSE 89 06/27/2019 1015   GLUCOSE 97 12/24/2017 0818   BUN 14 06/27/2019 1015   CREATININE 0.57 06/27/2019 1015   CREATININE 0.63 12/24/2017 0818   CALCIUM 9.6 06/27/2019 1015   GFRNONAA 112 06/27/2019 1015   GFRNONAA 109 12/24/2017 0818   GFRAA 129 06/27/2019 1015   GFRAA 126 12/24/2017 0818   Lab Results  Component Value Date   HGBA1C 5.4 06/27/2019   HGBA1C 6.0 (H) 11/30/2018   Lab Results  Component Value Date   INSULIN 12.5 06/27/2019   INSULIN 14.5 11/30/2018   CBC    Component Value Date/Time   WBC 9.8 11/30/2018 1047   WBC 9.6 12/24/2017 0818   RBC 4.51 11/30/2018 1047   RBC 5.06 12/24/2017 0818   HGB 13.6 11/30/2018 1047   HCT 39.1 11/30/2018 1047   PLT 303 11/30/2018 1047   MCV 87 11/30/2018 1047   MCH 30.2 11/30/2018  1047   MCH 29.2 12/24/2017 0818   MCHC 34.8 11/30/2018 1047  MCHC 33.4 12/24/2017 0818   RDW 12.9 11/30/2018 1047   LYMPHSABS 3.4 (H) 11/30/2018 1047   MONOABS 690 07/23/2016 1109   EOSABS 0.1 11/30/2018 1047   BASOSABS 0.0 11/30/2018 1047   Iron/TIBC/Ferritin/ %Sat No results found for: IRON, TIBC, FERRITIN, IRONPCTSAT Lipid Panel     Component Value Date/Time   CHOL 181 06/27/2019 1015   TRIG 135 06/27/2019 1015   HDL 48 06/27/2019 1015   CHOLHDL 4.4 11/30/2018 1047   CHOLHDL 4.2 12/24/2017 0818   VLDL 22 03/11/2015 0847   LDLCALC 106 (H) 06/27/2019 1015   LDLCALC 134 (H) 12/24/2017 0818   Hepatic Function Panel     Component Value Date/Time   PROT 6.6 06/27/2019 1015   ALBUMIN 4.0 06/27/2019 1015   AST 13 06/27/2019 1015   ALT 13 06/27/2019 1015   ALKPHOS 64 06/27/2019 1015   BILITOT <0.2 06/27/2019 1015      Component Value Date/Time   TSH 4.280 11/30/2018 1047   TSH 3.21 12/24/2017 0818   TSH 3.24 10/01/2016 1643     Ref. Range 06/27/2019 10:15  Vitamin D, 25-Hydroxy Latest Ref Range: 30.0 - 100.0 ng/mL 41.5    OBESITY BEHAVIORAL INTERVENTION VISIT  Today's visit was # 15   Starting weight: 235 lbs Starting date: 11/30/2018 Today's weight : 199 lbs Today's date: 07/27/2019 Total lbs lost to date: 36    07/27/2019  Height 5\' 3"  (1.6 m)  Weight 199 lb (90.3 kg)  BMI (Calculated) 35.26  BLOOD PRESSURE - SYSTOLIC 99991111  BLOOD PRESSURE - DIASTOLIC 76   Body Fat % 123456 %  Total Body Water (lbs) 87.2 lbs    ASK: We discussed the diagnosis of obesity with [name] today and [name] agreed to give [name] permission to discuss obesity behavioral modification therapy today.  ASSESS: [name] has the diagnosis of obesity and her BMI today is 35.26 [name] is in the action stage of change   ADVISE: [name] was educated on the multiple health risks of obesity as well as the benefit of weight loss to improve her health. She was advised of the need for  long term treatment and the importance of lifestyle modifications to improve her current health and to decrease her risk of future health problems.  AGREE: Multiple dietary modification options and treatment options were discussed and  [name] agreed to follow the recommendations documented in the above note.  ARRANGE: [name] was educated on the importance of frequent visits to treat obesity as outlined per CMS and USPSTF guidelines and agreed to schedule her next follow up appointment today.  I, Doreene Nest, am acting as transcriptionist for Dennard Nip, MD  I have reviewed the above documentation for accuracy and completeness, and I agree with the above. -Dennard Nip, MD

## 2019-08-09 ENCOUNTER — Other Ambulatory Visit: Payer: Self-pay | Admitting: Family Medicine

## 2019-08-09 NOTE — Telephone Encounter (Signed)
Pt requesting refill on Adderall      LOV: 07/27/19  LRF:  06/29/19

## 2019-08-10 MED ORDER — AMPHETAMINE-DEXTROAMPHET ER 25 MG PO CP24: 25.0000 mg | ORAL_CAPSULE | ORAL | 0 refills | Status: DC

## 2019-08-10 MED FILL — ADDERALL XR 25 MG CAPSULE: 25 | 30 days supply | Qty: 30 | Fill #0

## 2019-08-17 ENCOUNTER — Other Ambulatory Visit: Payer: Self-pay

## 2019-08-17 ENCOUNTER — Encounter (INDEPENDENT_AMBULATORY_CARE_PROVIDER_SITE_OTHER): Payer: Self-pay | Admitting: Family Medicine

## 2019-08-17 ENCOUNTER — Ambulatory Visit (INDEPENDENT_AMBULATORY_CARE_PROVIDER_SITE_OTHER): Payer: 59 | Admitting: Family Medicine

## 2019-08-17 VITALS — BP 114/74 | HR 77 | Temp 98.0°F | Ht 63.0 in | Wt 200.0 lb

## 2019-08-17 DIAGNOSIS — Z9189 Other specified personal risk factors, not elsewhere classified: Secondary | ICD-10-CM | POA: Diagnosis not present

## 2019-08-17 DIAGNOSIS — R7303 Prediabetes: Secondary | ICD-10-CM | POA: Diagnosis not present

## 2019-08-17 DIAGNOSIS — E559 Vitamin D deficiency, unspecified: Secondary | ICD-10-CM

## 2019-08-17 DIAGNOSIS — Z6835 Body mass index (BMI) 35.0-35.9, adult: Secondary | ICD-10-CM

## 2019-08-17 MED ORDER — VITAMIN D (ERGOCALCIFEROL) 1.25 MG (50000 UNIT) PO CAPS: 50000.0000 [IU] | ORAL_CAPSULE | ORAL | 0 refills | Status: DC

## 2019-08-18 MED FILL — VIT D2 1.25 MG (50,000 UNIT: 1.25 MG | 28 days supply | Qty: 4 | Fill #0

## 2019-08-21 NOTE — Progress Notes (Signed)
Office: [phone]  /  Fax: [phone]   HPI:   Chief Complaint: OBESITY [name] is here to discuss her progress with her obesity treatment plan. She is on the Category 3 plan and is following her eating plan approximately 80 % of the time. She states she is walking 20 to 30 minutes 1 time per week. [name] has been on vacation and she did some celebration eating, but she has gotten back on track since then. She is working on meal planning and she is getting a bit bored. Her weight is 200 lb (90.7 kg) today and has had a weight gain of 1 pound over a period of 3 weeks since her last visit. She has lost 35 lbs since starting treatment with [name].  Vitamin D deficiency [name] has a diagnosis of vitamin D deficiency. [name] is stable on vit D. Her last vitamin D level was at 41.5 and was not yet at goal. She denies nausea, vomiting or muscle weakness.  At risk for osteopenia and osteoporosis [name] is at higher risk of osteopenia and osteoporosis due to vitamin D deficiency.   Pre-Diabetes [name] has a diagnosis of prediabetes based on her elevated Hgb A1c and was informed this puts her at greater risk of developing diabetes. Her last A1c was at 5.4 She is taking metformin currently. [name] is working on diet and weight loss to decrease the risk of diabetes. She denies nausea, vomiting or hypoglycemia.  ASSESSMENT AND PLAN:  Vitamin D deficiency - Plan: Vitamin D, Ergocalciferol, (DRISDOL) 1.25 MG (50000 UT) CAPS capsule  Prediabetes  At risk for osteoporosis  Class 2 severe obesity with serious comorbidity and body mass index (BMI) of 35.0 to 35.9 in adult, unspecified obesity type ([place])  PLAN:  Vitamin D Deficiency [name] was informed that low vitamin D levels contributes to fatigue and are associated with obesity, breast, and colon cancer. [name] agrees to continue to take prescription Vit D @50 ,000 IU every week #4 with no refills and she will follow up for routine  testing of vitamin D, at least 2-3 times per year. She was informed of the risk of over-replacement of vitamin D and agrees to not increase her dose unless she discusses this with [name] first. We will check labs in 1 month and [name] agrees to follow up as directed.  At risk for osteopenia and osteoporosis [name] was given extended  (15 minutes) osteoporosis prevention counseling today. [name] is at risk for osteopenia and osteoporosis due to her vitamin D deficiency. She was encouraged to take her vitamin D and follow her higher calcium diet and increase strengthening exercise to help strengthen her bones and decrease her risk of osteopenia and osteoporosis.  Pre-Diabetes [name] will continue to work on weight loss, exercise, and decreasing simple carbohydrates in her diet to help decrease the risk of diabetes. We dicussed metformin including benefits and risks. She was informed that eating too many simple carbohydrates or too many calories at one sitting increases the likelihood of GI side effects. We will recheck labs in 1 month and [name] will continue metformin and follow up with [name] as directed to monitor her progress.  Obesity [name] is currently in the action stage of change. As such, her goal is to continue with weight loss efforts She has agreed to follow the Category 3 plan [name] has been instructed to work up to a goal of 150 minutes of combined cardio and strengthening exercise per week for weight loss and overall health benefits. We  discussed the following Behavioral Modification Strategies today: increasing lean protein intake, work on meal planning and easy cooking plans and travel eating strategies Recipes were given to patient today, to help with meal planning.   [name] has agreed to follow up with our clinic in 2 to 3 weeks. She was informed of the importance of frequent follow up visits to maximize her success with intensive lifestyle modifications for her multiple health  conditions.  ALLERGIES: Allergies  Allergen Reactions  . Adhesive [Tape] Rash  . Latex Rash    MEDICATIONS: Current Outpatient Medications on File Prior to Visit  Medication Sig Dispense Refill  . albuterol (PROVENTIL HFA;VENTOLIN HFA) 108 (90 Base) MCG/ACT inhaler Inhale 2 puffs into the lungs every 6 (six) hours as needed for wheezing. 1 Inhaler 11  . ALPRAZolam (XANAX) 0.25 MG tablet Take 1 tablet (0.25 mg total) by mouth daily as needed for anxiety. 30 tablet 2  . amphetamine-dextroamphetamine (ADDERALL XR) 25 MG 24 hr capsule Take 1 capsule by mouth every morning. 30 capsule 0  . furosemide (LASIX) 20 MG tablet Take 2 tablets (40 mg total) by mouth daily as needed (swelling). 60 tablet 2  . ibuprofen (ADVIL,MOTRIN) 200 MG tablet Take 400 mg by mouth 2 (two) times daily as needed for headache or moderate pain.    . metFORMIN (GLUCOPHAGE) 500 MG tablet Take 1 tablet (500 mg total) by mouth 2 (two) times daily with a meal. 60 tablet 0  . metoprolol tartrate (LOPRESSOR) 25 MG tablet Take 1 tablet (25 mg total) by mouth 2 (two) times daily. 180 tablet 3  . nitroGLYCERIN (NITROSTAT) 0.4 MG SL tablet Place 1 tablet (0.4 mg total) under the tongue every 5 (five) minutes as needed for chest pain. 25 tablet 3   No current facility-administered medications on file prior to visit.     PAST MEDICAL HISTORY: Past Medical History:  Diagnosis Date  . Anxiety   . Asthma   . Back pain   . Chest pain   . Chronic CHF ([place])   . Dyspnea   . Echocardiogram 10/25/2015   EF 60-65, normal wall motion, grade 1 diastolic dysfunction, GLS -19.1%  . Edema of left lower extremity   . Fracture dislocation of elbow joint 01/15/2013   right  . GERD (gastroesophageal reflux disease)    Omeprazole as needed, none in 6 mos.  . Hyperlipidemia   . Hypertension   . Joint pain   . Obesity   . Palpitations   . Prediabetes   . R/L Cardiac catheterization 08/18/2018   Normal coronary arteries, mean RA 6, mean  PA 17, PCWP 10  . Seborrheic dermatitis   . Sleep apnea    uses CPAP "most nights"    PAST SURGICAL HISTORY: Past Surgical History:  Procedure Laterality Date  . [place]; 10/10/2000   x 2  . KNEE ARTHROSCOPY Left 1991   also in 2019  . LAPAROSCOPIC ASSISTED VAGINAL HYSTERECTOMY  11/02/2006  . LAPAROSCOPIC LYSIS OF ADHESIONS  03/06/2005  . RADIAL HEAD ARTHROPLASTY Right 01/17/2013   Procedure: RIGHT RADIAL HEAD ARTHROPLASTY OPEN REDUCTION INTERNAL FIXATION CORONOID ;  Surgeon: Tennis Must, MD;  Location: [facility];  Service: Orthopedics;  Laterality: Right;  . RIGHT/LEFT HEART CATH AND CORONARY ANGIOGRAPHY N/A 08/18/2018   Procedure: RIGHT/LEFT HEART CATH AND CORONARY ANGIOGRAPHY;  Surgeon: Nelva Bush, MD;  Location: [facility];  Service: Cardiovascular;  Laterality: N/A;  . TUBAL LIGATION  03/06/2005  SOCIAL HISTORY: Social History   Tobacco Use  . Smoking status: Never Smoker  . Smokeless tobacco: Never Used  Substance Use Topics  . Alcohol use: No  . Drug use: No    FAMILY HISTORY: Family History  Problem Relation Age of Onset  . Diabetes Sister   . Heart disease Father 10       MI--1st Dx CAD. Died2that time  . Sudden death Father   . Diabetes Mother   . Hypertension Mother   . Heart disease Mother   . Obesity Mother   . Heart disease Maternal Uncle   . Ovarian cancer Maternal Grandmother   . Diabetes Maternal Grandmother   . Heart disease Maternal Grandmother        Event organiser  . Diabetes Paternal Grandmother   . Liver disease Paternal Aunt   . Heart disease Maternal Grandfather     ROS: Review of Systems  Constitutional: Negative for weight loss.  Gastrointestinal: Negative for nausea and vomiting.  Musculoskeletal:       Negative for muscle weakness  Endo/Heme/Allergies:       Negative for hypoglycemia    PHYSICAL EXAM: Blood pressure 114/74, pulse 77, temperature 98 F (36.7 C), temperature  source Oral, height 5\' 3"  (1.6 m), weight 200 lb (90.7 kg), SpO2 (!) 9 %. Body mass index is 35.43 kg/m. Physical Exam Vitals signs reviewed.  Constitutional:      Appearance: Normal appearance. She is well-developed. She is obese.  Cardiovascular:     Rate and Rhythm: Normal rate.  Pulmonary:     Effort: Pulmonary effort is normal.  Musculoskeletal: Normal range of motion.  Skin:    General: Skin is warm and dry.  Neurological:     Mental Status: She is alert and oriented to person, place, and time.  Psychiatric:        Mood and Affect: Mood normal.        Behavior: Behavior normal.     RECENT LABS AND TESTS: BMET    Component Value Date/Time   NA 138 06/27/2019 1015   K 4.3 06/27/2019 1015   CL 99 06/27/2019 1015   CO2 25 06/27/2019 1015   GLUCOSE 89 06/27/2019 1015   GLUCOSE 97 12/24/2017 0818   BUN 14 06/27/2019 1015   CREATININE 0.57 06/27/2019 1015   CREATININE 0.63 12/24/2017 0818   CALCIUM 9.6 06/27/2019 1015   GFRNONAA 112 06/27/2019 1015   GFRNONAA 109 12/24/2017 0818   GFRAA 129 06/27/2019 1015   GFRAA 126 12/24/2017 0818   Lab Results  Component Value Date   HGBA1C 5.4 06/27/2019   HGBA1C 6.0 (H) 11/30/2018   Lab Results  Component Value Date   INSULIN 12.5 06/27/2019   INSULIN 14.5 11/30/2018   CBC    Component Value Date/Time   WBC 9.8 11/30/2018 1047   WBC 9.6 12/24/2017 0818   RBC 4.51 11/30/2018 1047   RBC 5.06 12/24/2017 0818   HGB 13.6 11/30/2018 1047   HCT 39.1 11/30/2018 1047   PLT 303 11/30/2018 1047   MCV 87 11/30/2018 1047   MCH 30.2 11/30/2018 1047   MCH 29.2 12/24/2017 0818   MCHC 34.8 11/30/2018 1047   MCHC 33.4 12/24/2017 0818   RDW 12.9 11/30/2018 1047   LYMPHSABS 3.4 (H) 11/30/2018 1047   MONOABS 690 07/23/2016 1109   EOSABS 0.1 11/30/2018 1047   BASOSABS 0.0 11/30/2018 1047   Iron/TIBC/Ferritin/ %Sat No results found for: IRON, TIBC, FERRITIN, IRONPCTSAT Lipid Panel  Component Value Date/Time   CHOL 181  06/27/2019 1015   TRIG 135 06/27/2019 1015   HDL 48 06/27/2019 1015   CHOLHDL 4.4 11/30/2018 1047   CHOLHDL 4.2 12/24/2017 0818   VLDL 22 03/11/2015 0847   LDLCALC 106 (H) 06/27/2019 1015   LDLCALC 134 (H) 12/24/2017 0818   Hepatic Function Panel     Component Value Date/Time   PROT 6.6 06/27/2019 1015   ALBUMIN 4.0 06/27/2019 1015   AST 13 06/27/2019 1015   ALT 13 06/27/2019 1015   ALKPHOS 64 06/27/2019 1015   BILITOT <0.2 06/27/2019 1015      Component Value Date/Time   TSH 4.280 11/30/2018 1047   TSH 3.21 12/24/2017 0818   TSH 3.24 10/01/2016 1643     Ref. Range 06/27/2019 10:15  Vitamin D, 25-Hydroxy Latest Ref Range: 30.0 - 100.0 ng/mL 41.5    OBESITY BEHAVIORAL INTERVENTION VISIT  Today's visit was # 16   Starting weight: 235 lbs Starting date: 11/30/2018 Today's weight : 200 lbs Today's date: 08/17/2019 Total lbs lost to date: 35    08/17/2019  Height 5\' 3"  (1.6 m)  Weight 200 lb (90.7 kg)  BMI (Calculated) 35.44  BLOOD PRESSURE - SYSTOLIC 99991111  BLOOD PRESSURE - DIASTOLIC 74   Body Fat % [id number] %  Total Body Water (lbs) 86.4 lbs    ASK: We discussed the diagnosis of obesity with [name] today and [name] agreed to give [name] permission to discuss obesity behavioral modification therapy today.  ASSESS: [name] has the diagnosis of obesity and her BMI today is 34.44 [name] is in the action stage of change   ADVISE: [name] was educated on the multiple health risks of obesity as well as the benefit of weight loss to improve her health. She was advised of the need for long term treatment and the importance of lifestyle modifications to improve her current health and to decrease her risk of future health problems.  AGREE: Multiple dietary modification options and treatment options were discussed and  [name] agreed to follow the recommendations documented in the above note.  ARRANGE: [name] was educated on the importance of frequent visits to treat  obesity as outlined per CMS and USPSTF guidelines and agreed to schedule her next follow up appointment today.  I, Doreene Nest, am acting as transcriptionist for Dennard Nip, MD  I have reviewed the above documentation for accuracy and completeness, and I agree with the above. -Dennard Nip, MD

## 2019-09-07 ENCOUNTER — Ambulatory Visit (INDEPENDENT_AMBULATORY_CARE_PROVIDER_SITE_OTHER): Payer: 59 | Admitting: Family Medicine

## 2019-09-07 ENCOUNTER — Encounter (INDEPENDENT_AMBULATORY_CARE_PROVIDER_SITE_OTHER): Payer: Self-pay | Admitting: Family Medicine

## 2019-09-07 ENCOUNTER — Other Ambulatory Visit: Payer: Self-pay

## 2019-09-07 VITALS — BP 106/69 | HR 68 | Temp 97.5°F | Ht 63.0 in | Wt 197.0 lb

## 2019-09-07 DIAGNOSIS — Z6835 Body mass index (BMI) 35.0-35.9, adult: Secondary | ICD-10-CM

## 2019-09-07 DIAGNOSIS — E66812 Obesity, class 2: Secondary | ICD-10-CM

## 2019-09-07 DIAGNOSIS — I10 Essential (primary) hypertension: Secondary | ICD-10-CM | POA: Diagnosis not present

## 2019-09-07 DIAGNOSIS — Z9189 Other specified personal risk factors, not elsewhere classified: Secondary | ICD-10-CM | POA: Diagnosis not present

## 2019-09-07 DIAGNOSIS — R7303 Prediabetes: Secondary | ICD-10-CM

## 2019-09-07 MED ORDER — METFORMIN HCL 500 MG PO TABS: 500.0000 mg | ORAL_TABLET | Freq: Two times a day (BID) | ORAL | 0 refills | Status: DC

## 2019-09-07 MED FILL — metFORMIN HCL 500 MG TABS: 500 | 30 days supply | Qty: 60 | Fill #0

## 2019-09-10 NOTE — Progress Notes (Signed)
Office: [phone]  /  Fax: [phone]   HPI:   Chief Complaint: OBESITY [name] is here to discuss her progress with her obesity treatment plan. She is on the Category 3 plan and is following her eating plan approximately 90-95 % of the time. She states she is walking for 20-25 minutes 5 times per week. [name] continues to do well with weight loss. She is doing well with meal planning and dealing with sabotage. Her hunger is controlled when she takes her metformin regularly.  Her weight is 197 lb (89.4 kg) today and has had a weight loss of 3 pounds over a period of 3 weeks since her last visit. She has lost 38 lbs since starting treatment with [name].  Pre-Diabetes [name] has a diagnosis of pre-diabetes based on her elevated Hgb A1c and was informed this puts her at greater risk of developing diabetes. Her A1c is well controlled on metformin. She denies nausea, vomiting, or hypoglycemia. She is doing well with diet and exercise to decrease risk of diabetes.   At risk for diabetes [name] is at higher than average risk for developing diabetes due to her obesity and pre-diabetes. She currently denies polyuria or polydipsia.  Hypertension [name] is a 48 y.o. female with hypertension. [name]'s blood pressure is well controlled with diet, weight loss, and medications. She denies headaches, chest pain, or dizziness. She is working on weight loss to help control her blood pressure with the goal of decreasing her risk of heart attack and stroke.  ASSESSMENT AND PLAN:  Prediabetes - Plan: metFORMIN (GLUCOPHAGE) 500 MG tablet  Essential hypertension  At risk for diabetes mellitus  Class 2 severe obesity with serious comorbidity and body mass index (BMI) of 35.0 to 35.9 in adult, unspecified obesity type [facility]  PLAN:  Pre-Diabetes [name] will continue to work on weight loss, exercise, and decreasing simple carbohydrates in her diet to help decrease the risk of diabetes. We  dicussed metformin including benefits and risks. She was informed that eating too many simple carbohydrates or too many calories at one sitting increases the likelihood of GI side effects. [name] agrees to continue taking metformin 500 mg PO BID #60 and we will refill for 1 month. [name] agrees to follow up with our clinic in 2 to 3 weeks as directed to monitor her progress.  Diabetes risk counseling [name] was given extended (15 minutes) diabetes prevention counseling today. She is 48 y.o. female and has risk factors for diabetes including obesity and pre-diabetes. We discussed intensive lifestyle modifications today with an emphasis on weight loss as well as increasing exercise and decreasing simple carbohydrates in her diet.  Hypertension We discussed sodium restriction, working on healthy weight loss, and a regular exercise program as the means to achieve improved blood pressure control. [name] agreed with this plan and agreed to follow up as directed. We will continue to monitor her blood pressure as well as her progress with the above lifestyle modifications. [name] agrees to continue her medications, diet, and exercise, and will watch for signs of hypotension as she continues her lifestyle modifications. [name] agrees to follow up with our clinic in 2 to 3 weeks.  Obesity [name] is currently in the action stage of change. As such, her goal is to continue with weight loss efforts She has agreed to follow the Category 3 plan [name] has been instructed to work up to a goal of 150 minutes of combined cardio and strengthening exercise per week for weight loss and  overall health benefits. We discussed the following Behavioral Modification Strategies today: increasing lean protein intake and decreasing simple carbohydrates    [name] has agreed to follow up with our clinic in 2 to 3 weeks. She was informed of the importance of frequent follow up visits to maximize her success with intensive  lifestyle modifications for her multiple health conditions.  ALLERGIES: Allergies  Allergen Reactions  . Adhesive [Tape] Rash  . Latex Rash    MEDICATIONS: Current Outpatient Medications on File Prior to Visit  Medication Sig Dispense Refill  . albuterol (PROVENTIL HFA;VENTOLIN HFA) 108 (90 Base) MCG/ACT inhaler Inhale 2 puffs into the lungs every 6 (six) hours as needed for wheezing. 1 Inhaler 11  . ALPRAZolam (XANAX) 0.25 MG tablet Take 1 tablet (0.25 mg total) by mouth daily as needed for anxiety. 30 tablet 2  . amphetamine-dextroamphetamine (ADDERALL XR) 25 MG 24 hr capsule Take 1 capsule by mouth every morning. 30 capsule 0  . furosemide (LASIX) 20 MG tablet Take 2 tablets (40 mg total) by mouth daily as needed (swelling). 60 tablet 2  . ibuprofen (ADVIL,MOTRIN) 200 MG tablet Take 400 mg by mouth 2 (two) times daily as needed for headache or moderate pain.    . metoprolol tartrate (LOPRESSOR) 25 MG tablet Take 1 tablet (25 mg total) by mouth 2 (two) times daily. 180 tablet 3  . nitroGLYCERIN (NITROSTAT) 0.4 MG SL tablet Place 1 tablet (0.4 mg total) under the tongue every 5 (five) minutes as needed for chest pain. 25 tablet 3  . Vitamin D, Ergocalciferol, (DRISDOL) 1.25 MG (50000 UT) CAPS capsule Take 1 capsule (50,000 Units total) by mouth every 7 (seven) days. 4 capsule 0   No current facility-administered medications on file prior to visit.     PAST MEDICAL HISTORY: Past Medical History:  Diagnosis Date  . Anxiety   . Asthma   . Back pain   . Chest pain   . Chronic CHF ([place])   . Dyspnea   . Echocardiogram 10/25/2015   EF 60-65, normal wall motion, grade 1 diastolic dysfunction, GLS -19.1%  . Edema of left lower extremity   . Fracture dislocation of elbow joint 01/15/2013   right  . GERD (gastroesophageal reflux disease)    Omeprazole as needed, none in 6 mos.  . Hyperlipidemia   . Hypertension   . Joint pain   . Obesity   . Palpitations   . Prediabetes   . R/L  Cardiac catheterization 08/18/2018   Normal coronary arteries, mean RA 6, mean PA 17, PCWP 10  . Seborrheic dermatitis   . Sleep apnea    uses CPAP "most nights"    PAST SURGICAL HISTORY: Past Surgical History:  Procedure Laterality Date  . [place]; 10/10/2000   x 2  . KNEE ARTHROSCOPY Left 1991   also in 2019  . LAPAROSCOPIC ASSISTED VAGINAL HYSTERECTOMY  11/02/2006  . LAPAROSCOPIC LYSIS OF ADHESIONS  03/06/2005  . RADIAL HEAD ARTHROPLASTY Right 01/17/2013   Procedure: RIGHT RADIAL HEAD ARTHROPLASTY OPEN REDUCTION INTERNAL FIXATION CORONOID ;  Surgeon: Tennis Must, MD;  Location: [facility];  Service: Orthopedics;  Laterality: Right;  . RIGHT/LEFT HEART CATH AND CORONARY ANGIOGRAPHY N/A 08/18/2018   Procedure: RIGHT/LEFT HEART CATH AND CORONARY ANGIOGRAPHY;  Surgeon: Nelva Bush, MD;  Location: [facility];  Service: Cardiovascular;  Laterality: N/A;  . TUBAL LIGATION  03/06/2005    SOCIAL HISTORY: Social History   Tobacco Use  . Smoking  status: Never Smoker  . Smokeless tobacco: Never Used  Substance Use Topics  . Alcohol use: No  . Drug use: No    FAMILY HISTORY: Family History  Problem Relation Age of Onset  . Diabetes Sister   . Heart disease Father 29       MI--1st Dx CAD. Died2that time  . Sudden death Father   . Diabetes Mother   . Hypertension Mother   . Heart disease Mother   . Obesity Mother   . Heart disease Maternal Uncle   . Ovarian cancer Maternal Grandmother   . Diabetes Maternal Grandmother   . Heart disease Maternal Grandmother        Event organiser  . Diabetes Paternal Grandmother   . Liver disease Paternal Aunt   . Heart disease Maternal Grandfather     ROS: Review of Systems  Constitutional: Positive for weight loss.  Cardiovascular: Negative for chest pain.  Gastrointestinal: Negative for nausea and vomiting.  Genitourinary: Negative for frequency.  Neurological: Negative for dizziness and  headaches.  Endo/Heme/Allergies: Negative for polydipsia.       Negative hypoglycemia    PHYSICAL EXAM: Blood pressure 106/69, pulse 68, temperature (!) 97.5 F (36.4 C), temperature source Oral, height 5\' 3"  (1.6 m), weight 197 lb (89.4 kg), SpO2 98 %. Body mass index is 34.9 kg/m. Physical Exam Vitals signs reviewed.  Constitutional:      Appearance: Normal appearance. She is obese.  Cardiovascular:     Rate and Rhythm: Normal rate.     Pulses: Normal pulses.  Pulmonary:     Effort: Pulmonary effort is normal.     Breath sounds: Normal breath sounds.  Musculoskeletal: Normal range of motion.  Skin:    General: Skin is warm and dry.  Neurological:     Mental Status: She is alert and oriented to person, place, and time.  Psychiatric:        Mood and Affect: Mood normal.        Behavior: Behavior normal.     RECENT LABS AND TESTS: BMET    Component Value Date/Time   NA 138 06/27/2019 1015   K 4.3 06/27/2019 1015   CL 99 06/27/2019 1015   CO2 25 06/27/2019 1015   GLUCOSE 89 06/27/2019 1015   GLUCOSE 97 12/24/2017 0818   BUN 14 06/27/2019 1015   CREATININE 0.57 06/27/2019 1015   CREATININE 0.63 12/24/2017 0818   CALCIUM 9.6 06/27/2019 1015   GFRNONAA 112 06/27/2019 1015   GFRNONAA 109 12/24/2017 0818   GFRAA 129 06/27/2019 1015   GFRAA 126 12/24/2017 0818   Lab Results  Component Value Date   HGBA1C 5.4 06/27/2019   HGBA1C 6.0 (H) 11/30/2018   Lab Results  Component Value Date   INSULIN 12.5 06/27/2019   INSULIN 14.5 11/30/2018   CBC    Component Value Date/Time   WBC 9.8 11/30/2018 1047   WBC 9.6 12/24/2017 0818   RBC 4.51 11/30/2018 1047   RBC 5.06 12/24/2017 0818   HGB 13.6 11/30/2018 1047   HCT 39.1 11/30/2018 1047   PLT 303 11/30/2018 1047   MCV 87 11/30/2018 1047   MCH 30.2 11/30/2018 1047   MCH 29.2 12/24/2017 0818   MCHC 34.8 11/30/2018 1047   MCHC 33.4 12/24/2017 0818   RDW 12.9 11/30/2018 1047   LYMPHSABS 3.4 (H) 11/30/2018 1047    MONOABS 690 07/23/2016 1109   EOSABS 0.1 11/30/2018 1047   BASOSABS 0.0 11/30/2018 1047   Iron/TIBC/Ferritin/ %Sat No results  found for: IRON, TIBC, FERRITIN, IRONPCTSAT Lipid Panel     Component Value Date/Time   CHOL 181 06/27/2019 1015   TRIG 135 06/27/2019 1015   HDL 48 06/27/2019 1015   CHOLHDL 4.4 11/30/2018 1047   CHOLHDL 4.2 12/24/2017 0818   VLDL 22 03/11/2015 0847   LDLCALC 106 (H) 06/27/2019 1015   LDLCALC 134 (H) 12/24/2017 0818   Hepatic Function Panel     Component Value Date/Time   PROT 6.6 06/27/2019 1015   ALBUMIN 4.0 06/27/2019 1015   AST 13 06/27/2019 1015   ALT 13 06/27/2019 1015   ALKPHOS 64 06/27/2019 1015   BILITOT <0.2 06/27/2019 1015      Component Value Date/Time   TSH 4.280 11/30/2018 1047   TSH 3.21 12/24/2017 0818   TSH 3.24 10/01/2016 1643      OBESITY BEHAVIORAL INTERVENTION VISIT  Today's visit was # 41   Starting weight: 235 lbs Starting date: 11/30/2018 Today's weight : 197 lbs Today's date: 09/07/2019 Total lbs lost to date: 18    ASK: We discussed the diagnosis of obesity with [name] today and [name] agreed to give [name] permission to discuss obesity behavioral modification therapy today.  ASSESS: [name] has the diagnosis of obesity and her BMI today is 34.91 [name] is in the action stage of change   ADVISE: [name] was educated on the multiple health risks of obesity as well as the benefit of weight loss to improve her health. She was advised of the need for long term treatment and the importance of lifestyle modifications to improve her current health and to decrease her risk of future health problems.  AGREE: Multiple dietary modification options and treatment options were discussed and  [name] agreed to follow the recommendations documented in the above note.  ARRANGE: [name] was educated on the importance of frequent visits to treat obesity as outlined per CMS and USPSTF guidelines and agreed to  schedule her next follow up appointment today.  I, Trixie Dredge, am acting as transcriptionist for Dennard Nip, MD  I have reviewed the above documentation for accuracy and completeness, and I agree with the above. -Dennard Nip, MD

## 2019-09-26 ENCOUNTER — Other Ambulatory Visit: Payer: Self-pay | Admitting: Family Medicine

## 2019-09-26 MED ORDER — AMPHETAMINE-DEXTROAMPHET ER 25 MG PO CP24: 25.0000 mg | ORAL_CAPSULE | ORAL | 0 refills | Status: DC

## 2019-09-26 MED FILL — ADDERALL XR 25 MG CAPSULE: 25 | 30 days supply | Qty: 30 | Fill #0

## 2019-09-26 NOTE — Telephone Encounter (Signed)
Pt requesting refill on Adderall      LOV: 01/13/19  LRF:   08/10/19

## 2019-09-28 ENCOUNTER — Encounter (INDEPENDENT_AMBULATORY_CARE_PROVIDER_SITE_OTHER): Payer: Self-pay | Admitting: Family Medicine

## 2019-09-28 ENCOUNTER — Ambulatory Visit (INDEPENDENT_AMBULATORY_CARE_PROVIDER_SITE_OTHER): Payer: 59 | Admitting: Family Medicine

## 2019-09-28 ENCOUNTER — Other Ambulatory Visit: Payer: Self-pay

## 2019-09-28 VITALS — BP 105/79 | HR 79 | Temp 97.9°F | Ht 63.0 in | Wt 198.0 lb

## 2019-09-28 DIAGNOSIS — Z6835 Body mass index (BMI) 35.0-35.9, adult: Secondary | ICD-10-CM

## 2019-09-28 DIAGNOSIS — Z9189 Other specified personal risk factors, not elsewhere classified: Secondary | ICD-10-CM

## 2019-09-28 DIAGNOSIS — E559 Vitamin D deficiency, unspecified: Secondary | ICD-10-CM | POA: Diagnosis not present

## 2019-09-28 DIAGNOSIS — F3289 Other specified depressive episodes: Secondary | ICD-10-CM | POA: Diagnosis not present

## 2019-09-28 MED ORDER — TOPIRAMATE 50 MG PO TABS: 50.0000 mg | ORAL_TABLET | Freq: Every day | ORAL | 0 refills | Status: DC

## 2019-09-28 MED ORDER — VITAMIN D (ERGOCALCIFEROL) 1.25 MG (50000 UNIT) PO CAPS: 50000.0000 [IU] | ORAL_CAPSULE | ORAL | 0 refills | Status: DC

## 2019-09-28 MED FILL — VIT D2 1.25 MG (50,000 UNIT: 1.25 MG | 28 days supply | Qty: 4 | Fill #0

## 2019-09-28 MED FILL — TOPIRAMATE 50 MG TABLET: 50 | 30 days supply | Qty: 30 | Fill #0

## 2019-09-28 NOTE — Progress Notes (Signed)
Office: [phone]  /  Fax: [phone]   HPI:   Chief Complaint: OBESITY [name] is here to discuss her progress with her obesity treatment plan. She is on the Category 3 plan and is following her eating plan approximately 80-85% of the time. She states she is walking 1 mile 20 minutes 2-3 times per week. [place] notes increased stress, especially with family stress. She expected to gain weight, but was happy she only gained 1 lb. She is uncertain of her Thanksgiving plans, but concerned about further weight gain. Her weight is 198 lb (89.8 kg) today and has had a weight gain of 1 lb since her last visit. She has lost 37 lbs since starting treatment with [name].  Depression, Other [name] is struggling with emotional eating and using food for comfort to the extent that it is negatively impacting her health. She often snacks when she is not hungry. [name] sometimes feels she is out of control and then feels guilty that she made poor food choices. She has been working on behavior modification techniques to help reduce her emotional eating and has been somewhat successful. [name] notes increased stress eating, worse in the last 2 weeks, and she has increased comfort eating. She denies history of nephrolithiasis. She shows no sign of suicidal or homicidal ideations.  Depression screen [facility] 2/9 11/30/2018 12/29/2017  Decreased Interest 3 0  Down, Depressed, Hopeless 2 0  PHQ - 2 Score 5 0  Altered sleeping 0 -  Tired, decreased energy 2 -  Change in appetite 0 -  Feeling bad or failure about yourself  2 -  Trouble concentrating 2 -  Moving slowly or fidgety/restless 3 -  Suicidal thoughts 0 -  PHQ-9 Score 14 -  Difficult doing work/chores Somewhat difficult -   Vitamin D deficiency [name] has a diagnosis of Vitamin D deficiency, which is not yet at goal. She is currently stable on prescription Vit D and denies nausea, vomiting or muscle weakness.  At risk for cardiovascular disease  [name] is at a higher than average risk for cardiovascular disease due to obesity. She currently denies any chest pain.  ASSESSMENT AND PLAN:  Vitamin D deficiency - Plan: Vitamin D, Ergocalciferol, (DRISDOL) 1.25 MG (50000 UT) CAPS capsule  Other depression - with emotional eating - Plan: topiramate (TOPAMAX) 50 MG tablet  At risk for heart disease  Class 2 severe obesity with serious comorbidity and body mass index (BMI) of 35.0 to 35.9 in adult, unspecified obesity type ([name])  PLAN:  Depression, Other We discussed behavior modification techniques today to help [name] deal with her emotional eating and depression. [place] will start Topamax 50 mg QHS #30 with 0 refills and agrees to follow-up with our clinic in 3 weeks.  Vitamin D Deficiency [name] was informed that low Vitamin D levels contributes to fatigue and are associated with obesity, breast, and colon cancer. She agrees to continue to take prescription Vit D @ 50,000 IU every week #4 with 0 refills and will follow-up for routine testing of Vitamin D, at least 2-3 times per year. She was informed of the risk of over-replacement of Vitamin D and agrees to not increase her dose unless she discusses this with [name] first. [name] agrees to follow-up with our clinic in 3 weeks.  Cardiovascular risk counseling [name] was given extended (15 minutes) coronary artery disease prevention counseling today. She is 48 y.o. female and has risk factors for heart disease including obesity. We discussed intensive lifestyle modifications today with  an emphasis on specific weight loss instructions and strategies. Pt was also informed of the importance of increasing exercise and decreasing saturated fats to help prevent heart disease.  Obesity [name] is currently in the action stage of change. As such, her goal is to continue with weight loss efforts. She has agreed to follow the Category 3 plan. [name] has been instructed to work up to a goal of  150 minutes of combined cardio and strengthening exercise per week for weight loss and overall health benefits. We discussed the following Behavioral Modification Strategies today: increasing lean protein intake and holiday eating strategies.  [name] has agreed to follow-up with our clinic in 3 weeks. She was informed of the importance of frequent follow-up visits to maximize her success with intensive lifestyle modifications for her multiple health conditions.  ALLERGIES: Allergies  Allergen Reactions  . Adhesive [Tape] Rash  . Latex Rash    MEDICATIONS: Current Outpatient Medications on File Prior to Visit  Medication Sig Dispense Refill  . albuterol (PROVENTIL HFA;VENTOLIN HFA) 108 (90 Base) MCG/ACT inhaler Inhale 2 puffs into the lungs every 6 (six) hours as needed for wheezing. 1 Inhaler 11  . ALPRAZolam (XANAX) 0.25 MG tablet Take 1 tablet (0.25 mg total) by mouth daily as needed for anxiety. 30 tablet 2  . amphetamine-dextroamphetamine (ADDERALL XR) 25 MG 24 hr capsule Take 1 capsule by mouth every morning. 30 capsule 0  . furosemide (LASIX) 20 MG tablet Take 2 tablets (40 mg total) by mouth daily as needed (swelling). 60 tablet 2  . ibuprofen (ADVIL,MOTRIN) 200 MG tablet Take 400 mg by mouth 2 (two) times daily as needed for headache or moderate pain.    . metFORMIN (GLUCOPHAGE) 500 MG tablet Take 1 tablet (500 mg total) by mouth 2 (two) times daily with a meal. 60 tablet 0  . metoprolol tartrate (LOPRESSOR) 25 MG tablet Take 1 tablet (25 mg total) by mouth 2 (two) times daily. 180 tablet 3  . nitroGLYCERIN (NITROSTAT) 0.4 MG SL tablet Place 1 tablet (0.4 mg total) under the tongue every 5 (five) minutes as needed for chest pain. 25 tablet 3   No current facility-administered medications on file prior to visit.     PAST MEDICAL HISTORY: Past Medical History:  Diagnosis Date  . Anxiety   . Asthma   . Back pain   . Chest pain   . Chronic CHF ([employer])   . Dyspnea   .  Echocardiogram 10/25/2015   EF 60-65, normal wall motion, grade 1 diastolic dysfunction, GLS -19.1%  . Edema of left lower extremity   . Fracture dislocation of elbow joint 01/15/2013   right  . GERD (gastroesophageal reflux disease)    Omeprazole as needed, none in 6 mos.  . Hyperlipidemia   . Hypertension   . Joint pain   . Obesity   . Palpitations   . Prediabetes   . R/L Cardiac catheterization 08/18/2018   Normal coronary arteries, mean RA 6, mean PA 17, PCWP 10  . Seborrheic dermatitis   . Sleep apnea    uses CPAP "most nights"    PAST SURGICAL HISTORY: Past Surgical History:  Procedure Laterality Date  . [place]; 10/10/2000   x 2  . KNEE ARTHROSCOPY Left 1991   also in 2019  . LAPAROSCOPIC ASSISTED VAGINAL HYSTERECTOMY  11/02/2006  . LAPAROSCOPIC LYSIS OF ADHESIONS  03/06/2005  . RADIAL HEAD ARTHROPLASTY Right 01/17/2013   Procedure: RIGHT RADIAL HEAD ARTHROPLASTY OPEN REDUCTION INTERNAL FIXATION  CORONOID ;  Surgeon: Tennis Must, MD;  Location: [place];  Service: Orthopedics;  Laterality: Right;  . RIGHT/LEFT HEART CATH AND CORONARY ANGIOGRAPHY N/A 08/18/2018   Procedure: RIGHT/LEFT HEART CATH AND CORONARY ANGIOGRAPHY;  Surgeon: Nelva Bush, MD;  Location: [place] [facility];  Service: Cardiovascular;  Laterality: N/A;  . TUBAL LIGATION  03/06/2005    SOCIAL HISTORY: Social History   Tobacco Use  . Smoking status: Never Smoker  . Smokeless tobacco: Never Used  Substance Use Topics  . Alcohol use: No  . Drug use: No    FAMILY HISTORY: Family History  Problem Relation Age of Onset  . Diabetes Sister   . Heart disease Father 58       MI--1st Dx CAD. Died2that time  . Sudden death Father   . Diabetes Mother   . Hypertension Mother   . Heart disease Mother   . Obesity Mother   . Heart disease Maternal Uncle   . Ovarian cancer Maternal Grandmother   . Diabetes Maternal Grandmother   . Heart disease Maternal Grandmother         Event organiser  . Diabetes Paternal Grandmother   . Liver disease Paternal Aunt   . Heart disease Maternal Grandfather    ROS: Review of Systems  Gastrointestinal: Negative for nausea and vomiting.  Musculoskeletal:       Negative for muscle weakness.  Psychiatric/Behavioral: Positive for depression. Negative for suicidal ideas.       Negative for homicidal ideas.   PHYSICAL EXAM: Blood pressure 105/79, pulse 79, temperature 97.9 F (36.6 C), temperature source Oral, height 5\' 3"  (1.6 m), weight 198 lb (89.8 kg), SpO2 98 %. Body mass index is 35.07 kg/m. Physical Exam Vitals signs reviewed.  Constitutional:      Appearance: Normal appearance. She is obese.  Cardiovascular:     Rate and Rhythm: Normal rate.     Pulses: Normal pulses.  Pulmonary:     Effort: Pulmonary effort is normal.     Breath sounds: Normal breath sounds.  Musculoskeletal: Normal range of motion.  Skin:    General: Skin is warm and dry.  Neurological:     Mental Status: She is alert and oriented to person, place, and time.  Psychiatric:        Behavior: Behavior normal.   RECENT LABS AND TESTS: BMET    Component Value Date/Time   NA 138 06/27/2019 1015   K 4.3 06/27/2019 1015   CL 99 06/27/2019 1015   CO2 25 06/27/2019 1015   GLUCOSE 89 06/27/2019 1015   GLUCOSE 97 12/24/2017 0818   BUN 14 06/27/2019 1015   CREATININE 0.57 06/27/2019 1015   CREATININE 0.63 12/24/2017 0818   CALCIUM 9.6 06/27/2019 1015   GFRNONAA 112 06/27/2019 1015   GFRNONAA 109 12/24/2017 0818   GFRAA 129 06/27/2019 1015   GFRAA 126 12/24/2017 0818   Lab Results  Component Value Date   HGBA1C 5.4 06/27/2019   HGBA1C 6.0 (H) 11/30/2018   Lab Results  Component Value Date   INSULIN 12.5 06/27/2019   INSULIN 14.5 11/30/2018   CBC    Component Value Date/Time   WBC 9.8 11/30/2018 1047   WBC 9.6 12/24/2017 0818   RBC 4.51 11/30/2018 1047   RBC 5.06 12/24/2017 0818   HGB 13.6 11/30/2018 1047   HCT 39.1  11/30/2018 1047   PLT 303 11/30/2018 1047   MCV 87 11/30/2018 1047   MCH 30.2 11/30/2018 1047  MCH 29.2 12/24/2017 0818   MCHC 34.8 11/30/2018 1047   MCHC 33.4 12/24/2017 0818   RDW 12.9 11/30/2018 1047   LYMPHSABS 3.4 (H) 11/30/2018 1047   MONOABS 690 07/23/2016 1109   EOSABS 0.1 11/30/2018 1047   BASOSABS 0.0 11/30/2018 1047   Iron/TIBC/Ferritin/ %Sat No results found for: IRON, TIBC, FERRITIN, IRONPCTSAT Lipid Panel     Component Value Date/Time   CHOL 181 06/27/2019 1015   TRIG 135 06/27/2019 1015   HDL 48 06/27/2019 1015   CHOLHDL 4.4 11/30/2018 1047   CHOLHDL 4.2 12/24/2017 0818   VLDL 22 03/11/2015 0847   LDLCALC 106 (H) 06/27/2019 1015   LDLCALC 134 (H) 12/24/2017 0818   Hepatic Function Panel     Component Value Date/Time   PROT 6.6 06/27/2019 1015   ALBUMIN 4.0 06/27/2019 1015   AST 13 06/27/2019 1015   ALT 13 06/27/2019 1015   ALKPHOS 64 06/27/2019 1015   BILITOT <0.2 06/27/2019 1015      Component Value Date/Time   TSH 4.280 11/30/2018 1047   TSH 3.21 12/24/2017 0818   TSH 3.24 10/01/2016 1643   Results for ATHANASIA, KRIETE (MRN [id number]) as of 09/28/2019 11:37  Ref. Range 06/27/2019 10:15  Vitamin D, 25-Hydroxy Latest Ref Range: 30.0 - 100.0 ng/mL 41.5   OBESITY BEHAVIORAL INTERVENTION VISIT  Today's visit was #18  Starting weight: 235 lbs Starting date: 11/30/2018 Today's weight: 198 lbs  Today's date: 09/28/2019 Total lbs lost to date: 37 lbs     09/28/2019  Height 5\' 3"  (1.6 m)  Weight 198 lb (89.8 kg)  BMI (Calculated) 35.08  BLOOD PRESSURE - SYSTOLIC [id number]  BLOOD PRESSURE - DIASTOLIC 79   Body Fat % 0000000 %  Total Body Water (lbs) 86.8 lbs   ASK: We discussed the diagnosis of obesity with Maureen Chatters today and [place] agreed to give [name] permission to discuss obesity behavioral modification therapy today.  ASSESS: Inola has the diagnosis of obesity and her BMI today is 35.1. [place] is in the action stage of change.    ADVISE: Jalynn was educated on the multiple health risks of obesity as well as the benefit of weight loss to improve her health. She was advised of the need for long term treatment and the importance of lifestyle modifications to improve her current health and to decrease her risk of future health problems.  AGREE: Multiple dietary modification options and treatment options were discussed and  Bleu agreed to follow the recommendations documented in the above note.  ARRANGE: Lorryn was educated on the importance of frequent visits to treat obesity as outlined per CMS and USPSTF guidelines and agreed to schedule her next follow up appointment today.  I, Michaelene Song, am acting as Location manager for Dennard Nip, MD  I have reviewed the above documentation for accuracy and completeness, and I agree with the above. -Dennard Nip, MD

## 2019-10-16 ENCOUNTER — Other Ambulatory Visit: Payer: Self-pay | Admitting: Family Medicine

## 2019-10-16 ENCOUNTER — Telehealth: Payer: Self-pay | Admitting: Family Medicine

## 2019-10-16 MED ORDER — ALPRAZOLAM 0.5 MG PO TABS: 0.5000 mg | ORAL_TABLET | Freq: Three times a day (TID) | ORAL | 0 refills | Status: DC | PRN

## 2019-10-16 MED FILL — ALPRAZolam 0.5 MG TABS: 0.5 | 15 days supply | Qty: 45 | Fill #0

## 2019-10-16 NOTE — Telephone Encounter (Signed)
Pt called and is going through some family issues right now and would like to know if we could increase her Xanax dosing?  Her last refill was 01/26/19  .25mg  / #30

## 2019-10-16 NOTE — Telephone Encounter (Signed)
I will increase her Xanax to 0.5 mg.  45 tabs.

## 2019-10-16 NOTE — Telephone Encounter (Signed)
Pt aware.

## 2019-10-19 ENCOUNTER — Ambulatory Visit (INDEPENDENT_AMBULATORY_CARE_PROVIDER_SITE_OTHER): Payer: 59 | Admitting: Family Medicine

## 2019-10-19 ENCOUNTER — Other Ambulatory Visit: Payer: Self-pay

## 2019-10-19 ENCOUNTER — Encounter (INDEPENDENT_AMBULATORY_CARE_PROVIDER_SITE_OTHER): Payer: Self-pay | Admitting: Family Medicine

## 2019-10-19 VITALS — BP 113/76 | HR 88 | Temp 98.4°F | Ht 63.0 in | Wt 193.0 lb

## 2019-10-19 DIAGNOSIS — Z6834 Body mass index (BMI) 34.0-34.9, adult: Secondary | ICD-10-CM

## 2019-10-19 DIAGNOSIS — F3289 Other specified depressive episodes: Secondary | ICD-10-CM | POA: Diagnosis not present

## 2019-10-19 DIAGNOSIS — Z9189 Other specified personal risk factors, not elsewhere classified: Secondary | ICD-10-CM

## 2019-10-19 DIAGNOSIS — E559 Vitamin D deficiency, unspecified: Secondary | ICD-10-CM | POA: Diagnosis not present

## 2019-10-19 DIAGNOSIS — E669 Obesity, unspecified: Secondary | ICD-10-CM | POA: Diagnosis not present

## 2019-10-19 MED ORDER — VITAMIN D (ERGOCALCIFEROL) 1.25 MG (50000 UNIT) PO CAPS: 50000.0000 [IU] | ORAL_CAPSULE | ORAL | 0 refills | Status: DC

## 2019-10-19 NOTE — Progress Notes (Signed)
Office: [phone]  /  Fax: [phone]   HPI:   Chief Complaint: OBESITY [name] is here to discuss her progress with her obesity treatment plan. She is on the Category 3 plan and is following her eating plan approximately 80% of the time. She states she is walking 20-25 minutes 4 times per week. [name] continues to do well on her Category 3 plan. She indulged over Thanksgiving but did well with PC. She is happy with her eating plan.  Her weight is 193 lb (87.5 kg) today and has had a weight loss of 5 pounds over a period of 3 weeks since her last visit. She has lost 42 lbs since starting treatment with [name].  Vitamin D deficiency [name] has a diagnosis of Vitamin D deficiency. She is currently stable on prescription Vit D and denies nausea, vomiting or muscle weakness.  At risk for osteopenia and osteoporosis [name] is at higher risk of osteopenia and osteoporosis due to Vitamin D deficiency.   Emotional Eating [name] started Topamax 50 mg 1/2 pill and felt it caused dizziness and numbness so she stopped and symptoms improved. She restarted it and symptoms returned, but much less. She did well with decreased emotional eating.  ASSESSMENT AND PLAN:  Vitamin D deficiency - Plan: Vitamin D, Ergocalciferol, (DRISDOL) 1.25 MG (50000 UT) CAPS capsule  Other depression, emotional eating  At risk for osteoporosis  Class 1 obesity with serious comorbidity and body mass index (BMI) of 34.0 to 34.9 in adult, unspecified obesity type  PLAN:  Vitamin D Deficiency [name] was informed that low Vitamin D levels contributes to fatigue and are associated with obesity, breast, and colon cancer. She agrees to continue to take prescription Vit D @ 50,000 IU every week #4 with 0 refills and will follow-up for routine testing of Vitamin D at her next visit. She was informed of the risk of over-replacement of Vitamin D and agrees to not increase her dose unless she discusses this with [name] first.  [name] agrees to follow-up with our clinic in 4 weeks.  At risk for osteopenia and osteoporosis [name] was given extended  (15 minutes) osteoporosis prevention counseling today. [name] is at risk for osteopenia and osteoporosis due to her Vitamin D deficiency. She was encouraged to take her Vitamin D and follow her higher calcium diet and increase strengthening exercise to help strengthen her bones and decrease her risk of osteopenia and osteoporosis.  Emotional Eating [name] will continue Topamax as is. She will follow-up as directed to monitor her progress.  Obesity [name] is currently in the action stage of change. As such, her goal is to continue with weight loss efforts. She has agreed to follow the Category 3 plan.  [name] has been instructed to work up to a goal of 150 minutes of combined cardio and strengthening exercise per week for weight loss and overall health benefits. We discussed the following Behavioral Modification Stratagies today: holiday eating strategies and celebration eating strategies.  [name] has agreed to follow-up with our clinic in 4 weeks. She was informed of the importance of frequent follow-up visits to maximize her success with intensive lifestyle modifications for her multiple health conditions.  ALLERGIES: Allergies  Allergen Reactions   Adhesive [Tape] Rash   Latex Rash    MEDICATIONS: Current Outpatient Medications on File Prior to Visit  Medication Sig Dispense Refill   albuterol (PROVENTIL HFA;VENTOLIN HFA) 108 (90 Base) MCG/ACT inhaler Inhale 2 puffs into the lungs every 6 (six) hours as needed for  wheezing. 1 Inhaler 11   ALPRAZolam (XANAX) 0.5 MG tablet Take 1 tablet (0.5 mg total) by mouth 3 (three) times daily as needed. 45 tablet 0   amphetamine-dextroamphetamine (ADDERALL XR) 25 MG 24 hr capsule Take 1 capsule by mouth every morning. 30 capsule 0   furosemide (LASIX) 20 MG tablet Take 2 tablets (40 mg total) by mouth daily as  needed (swelling). 60 tablet 2   ibuprofen (ADVIL,MOTRIN) 200 MG tablet Take 400 mg by mouth 2 (two) times daily as needed for headache or moderate pain.     metFORMIN (GLUCOPHAGE) 500 MG tablet Take 1 tablet (500 mg total) by mouth 2 (two) times daily with a meal. 60 tablet 0   metoprolol tartrate (LOPRESSOR) 25 MG tablet Take 1 tablet (25 mg total) by mouth 2 (two) times daily. 180 tablet 3   nitroGLYCERIN (NITROSTAT) 0.4 MG SL tablet Place 1 tablet (0.4 mg total) under the tongue every 5 (five) minutes as needed for chest pain. 25 tablet 3   topiramate (TOPAMAX) 50 MG tablet Take 1 tablet (50 mg total) by mouth at bedtime. 30 tablet 0   Vitamin D, Ergocalciferol, (DRISDOL) 1.25 MG (50000 UT) CAPS capsule Take 1 capsule (50,000 Units total) by mouth every 7 (seven) days. 4 capsule 0   No current facility-administered medications on file prior to visit.     PAST MEDICAL HISTORY: Past Medical History:  Diagnosis Date   Anxiety    Asthma    Back pain    Chest pain    Chronic CHF (HCC)    Dyspnea    Echocardiogram 10/25/2015   EF 60-65, normal wall motion, grade 1 diastolic dysfunction, GLS -19.1%   Edema of left lower extremity    Fracture dislocation of elbow joint 01/15/2013   right   GERD (gastroesophageal reflux disease)    Omeprazole as needed, none in 6 mos.   Hyperlipidemia    Hypertension    Joint pain    Obesity    Palpitations    Prediabetes    R/L Cardiac catheterization 08/18/2018   Normal coronary arteries, mean RA 6, mean PA 17, PCWP 10   Seborrheic dermatitis    Sleep apnea    uses CPAP "most nights"    PAST SURGICAL HISTORY: Past Surgical History:  Procedure Laterality Date   CESAREAN SECTION  1998; 10/10/2000   x 2   KNEE ARTHROSCOPY Left 1991   also in 2019   [employer]  11/02/2006   LAPAROSCOPIC LYSIS OF ADHESIONS  03/06/2005   RADIAL HEAD ARTHROPLASTY Right 01/17/2013   Procedure: RIGHT  RADIAL HEAD ARTHROPLASTY OPEN REDUCTION INTERNAL FIXATION CORONOID ;  Surgeon: Tennis Must, MD;  Location: [facility];  Service: Orthopedics;  Laterality: Right;   RIGHT/LEFT HEART CATH AND CORONARY ANGIOGRAPHY N/A 08/18/2018   Procedure: RIGHT/LEFT HEART CATH AND CORONARY ANGIOGRAPHY;  Surgeon: Nelva Bush, MD;  Location: [facility];  Service: Cardiovascular;  Laterality: N/A;   TUBAL LIGATION  03/06/2005    SOCIAL HISTORY: Social History   Tobacco Use   Smoking status: Never Smoker   Smokeless tobacco: Never Used  Substance Use Topics   Alcohol use: No   Drug use: No    FAMILY HISTORY: Family History  Problem Relation Age of Onset   Diabetes Sister    Heart disease Father 37       MI--1st Dx CAD. Died2that time   Sudden death Father    Diabetes Mother  Hypertension Mother    Heart disease Mother    Obesity Mother    Heart disease Maternal Uncle    Ovarian cancer Maternal Grandmother    Diabetes Maternal Grandmother    Heart disease Maternal Grandmother        Great Grandmother   Diabetes Paternal Grandmother    Liver disease Paternal Aunt    Heart disease Maternal Grandfather    ROS: Review of Systems  Gastrointestinal: Negative for nausea and vomiting.  Musculoskeletal:       Negative for muscle weakness.  Psychiatric/Behavioral:       Positive for emotional eating   PHYSICAL EXAM: Blood pressure 113/76, pulse 88, temperature 98.4 F (36.9 C), temperature source Oral, height 5\' 3"  (1.6 m), weight 193 lb (87.5 kg), SpO2 99 %. Body mass index is 34.19 kg/m. Physical Exam Vitals signs reviewed.  Constitutional:      Appearance: Normal appearance. She is obese.  Cardiovascular:     Rate and Rhythm: Normal rate.     Pulses: Normal pulses.  Pulmonary:     Effort: Pulmonary effort is normal.     Breath sounds: Normal breath sounds.  Musculoskeletal: Normal range of motion.  Skin:    General: Skin is warm and  dry.  Neurological:     Mental Status: She is alert and oriented to person, place, and time.  Psychiatric:        Behavior: Behavior normal.   RECENT LABS AND TESTS: BMET    Component Value Date/Time   NA 138 06/27/2019 1015   K 4.3 06/27/2019 1015   CL 99 06/27/2019 1015   CO2 25 06/27/2019 1015   GLUCOSE 89 06/27/2019 1015   GLUCOSE 97 12/24/2017 0818   BUN 14 06/27/2019 1015   CREATININE 0.57 06/27/2019 1015   CREATININE 0.63 12/24/2017 0818   CALCIUM 9.6 06/27/2019 1015   GFRNONAA 112 06/27/2019 1015   GFRNONAA 109 12/24/2017 0818   GFRAA 129 06/27/2019 1015   GFRAA 126 12/24/2017 0818   Lab Results  Component Value Date   HGBA1C 5.4 06/27/2019   HGBA1C 6.0 (H) 11/30/2018   Lab Results  Component Value Date   INSULIN 12.5 06/27/2019   INSULIN 14.5 11/30/2018   CBC    Component Value Date/Time   WBC 9.8 11/30/2018 1047   WBC 9.6 12/24/2017 0818   RBC 4.51 11/30/2018 1047   RBC 5.06 12/24/2017 0818   HGB 13.6 11/30/2018 1047   HCT 39.1 11/30/2018 1047   PLT 303 11/30/2018 1047   MCV 87 11/30/2018 1047   MCH 30.2 11/30/2018 1047   MCH 29.2 12/24/2017 0818   MCHC 34.8 11/30/2018 1047   MCHC 33.4 12/24/2017 0818   RDW 12.9 11/30/2018 1047   LYMPHSABS 3.4 (H) 11/30/2018 1047   MONOABS 690 07/23/2016 1109   EOSABS 0.1 11/30/2018 1047   BASOSABS 0.0 11/30/2018 1047   Iron/TIBC/Ferritin/ %Sat No results found for: IRON, TIBC, FERRITIN, IRONPCTSAT Lipid Panel     Component Value Date/Time   CHOL 181 06/27/2019 1015   TRIG 135 06/27/2019 1015   HDL 48 06/27/2019 1015   CHOLHDL 4.4 11/30/2018 1047   CHOLHDL 4.2 12/24/2017 0818   VLDL 22 03/11/2015 0847   LDLCALC 106 (H) 06/27/2019 1015   LDLCALC 134 (H) 12/24/2017 0818   Hepatic Function Panel     Component Value Date/Time   PROT 6.6 06/27/2019 1015   ALBUMIN 4.0 06/27/2019 1015   AST 13 06/27/2019 1015   ALT 13 06/27/2019 1015  ALKPHOS 64 06/27/2019 1015   BILITOT <0.2 06/27/2019 1015        Component Value Date/Time   TSH 4.280 11/30/2018 1047   TSH 3.21 12/24/2017 0818   TSH 3.24 10/01/2016 1643   Results for AAYLAH, WALEGA (MRN [id number]) as of 10/19/2019 12:41  Ref. Range 06/27/2019 10:15  Vitamin D, 25-Hydroxy Latest Ref Range: 30.0 - 100.0 ng/mL 41.5   OBESITY BEHAVIORAL INTERVENTION VISIT  Today's visit was #19  Starting weight: 235 lbs Starting date: 11/30/2018 Today's weight: 193 lbd  Today's date: 10/19/2019 Total lbs lost to date: 42     10/19/2019  Height 5\' 3"  (1.6 m)  Weight 193 lb (87.5 kg)  BMI (Calculated) 34.2  BLOOD PRESSURE - SYSTOLIC [id number]  BLOOD PRESSURE - DIASTOLIC 76   Body Fat % [id number] %  Total Body Water (lbs) 83.6 lbs   ASK: We discussed the diagnosis of obesity with [name] today and [name] agreed to give [name] permission to discuss obesity behavioral modification therapy today.  ASSESS: [name] has the diagnosis of obesity and her BMI today is 34.3. [name] is in the action stage of change.   ADVISE: [name] was educated on the multiple health risks of obesity as well as the benefit of weight loss to improve her health. She was advised of the need for long term treatment and the importance of lifestyle modifications to improve her current health and to decrease her risk of future health problems.  AGREE: Multiple dietary modification options and treatment options were discussed and  [name] agreed to follow the recommendations documented in the above note.  ARRANGE: [name] was educated on the importance of frequent visits to treat obesity as outlined per CMS and USPSTF guidelines and agreed to schedule her next follow up appointment today.  I, Michaelene Song, am acting as Location manager for Dennard Nip, MD  I have reviewed the above documentation for accuracy and completeness, and I agree with the above. -Dennard Nip, MD

## 2019-10-20 MED FILL — VIT D2 1.25 MG (50,000 UNIT: 1.25 MG | 28 days supply | Qty: 4 | Fill #0

## 2019-11-13 MED FILL — FUROSEMIDE 20 MG TABS: 20 | 30 days supply | Qty: 60 | Fill #1

## 2019-11-24 ENCOUNTER — Other Ambulatory Visit: Payer: Self-pay | Admitting: Family Medicine

## 2019-11-24 MED ORDER — AMPHETAMINE-DEXTROAMPHET ER 25 MG PO CP24: 25.0000 mg | ORAL_CAPSULE | ORAL | 0 refills | Status: DC

## 2019-11-24 MED FILL — ADDERALL XR 25 MG CAPSULE: 25 | 30 days supply | Qty: 30 | Fill #0

## 2019-11-24 NOTE — Telephone Encounter (Signed)
Ok to refill??  Last office visit 10/19/2019.  Last refill 09/26/2019.

## 2019-11-27 ENCOUNTER — Other Ambulatory Visit: Payer: Self-pay

## 2019-11-27 ENCOUNTER — Ambulatory Visit (INDEPENDENT_AMBULATORY_CARE_PROVIDER_SITE_OTHER): Payer: 59 | Admitting: Family Medicine

## 2019-11-27 ENCOUNTER — Encounter (INDEPENDENT_AMBULATORY_CARE_PROVIDER_SITE_OTHER): Payer: Self-pay | Admitting: Family Medicine

## 2019-11-27 VITALS — BP 131/80 | HR 69 | Temp 97.5°F | Ht 63.0 in | Wt 201.0 lb

## 2019-11-27 DIAGNOSIS — R7303 Prediabetes: Secondary | ICD-10-CM | POA: Diagnosis not present

## 2019-11-27 DIAGNOSIS — E559 Vitamin D deficiency, unspecified: Secondary | ICD-10-CM | POA: Diagnosis not present

## 2019-11-27 DIAGNOSIS — Z9189 Other specified personal risk factors, not elsewhere classified: Secondary | ICD-10-CM

## 2019-11-27 DIAGNOSIS — F3289 Other specified depressive episodes: Secondary | ICD-10-CM

## 2019-11-27 DIAGNOSIS — Z6835 Body mass index (BMI) 35.0-35.9, adult: Secondary | ICD-10-CM | POA: Diagnosis not present

## 2019-11-27 MED ORDER — TOPIRAMATE 50 MG PO TABS: 50.0000 mg | ORAL_TABLET | Freq: Every day | ORAL | 0 refills | Status: DC

## 2019-11-27 MED ORDER — VITAMIN D (ERGOCALCIFEROL) 1.25 MG (50000 UNIT) PO CAPS: 50000.0000 [IU] | ORAL_CAPSULE | ORAL | 0 refills | Status: DC

## 2019-11-27 MED ORDER — METFORMIN HCL 500 MG PO TABS: 500.0000 mg | ORAL_TABLET | Freq: Two times a day (BID) | ORAL | 0 refills | Status: DC

## 2019-11-27 MED FILL — TOPIRAMATE 50 MG TABLET: 50 | 30 days supply | Qty: 30 | Fill #0

## 2019-11-27 MED FILL — metFORMIN HCL 500 MG TABS: 500 | 30 days supply | Qty: 60 | Fill #0

## 2019-11-27 MED FILL — VIT D2 1.25 MG (50,000 UNIT: 1.25 MG | 28 days supply | Qty: 4 | Fill #0

## 2019-11-28 LAB — COMPREHENSIVE METABOLIC PANEL
ALT: 7 IU/L (ref 0–32)
AST: 9 IU/L (ref 0–40)
Albumin/Globulin Ratio: 1.9 (ref 1.2–2.2)
Albumin: 4.5 g/dL (ref 3.8–4.8)
Alkaline Phosphatase: 73 IU/L (ref 39–117)
BUN/Creatinine Ratio: 21 (ref 9–23)
BUN: 13 mg/dL (ref 6–24)
Bilirubin Total: 0.3 mg/dL (ref 0.0–1.2)
CO2: 24 mmol/L (ref 20–29)
Calcium: 9.1 mg/dL (ref 8.7–10.2)
Chloride: 106 mmol/L (ref 96–106)
Creatinine, Ser: 0.62 mg/dL (ref 0.57–1.00)
GFR calc Af Amer: 125 mL/min/{1.73_m2} (ref >59)
GFR calc non Af Amer: 108 mL/min/{1.73_m2} (ref >59)
Globulin, Total: 2.4 g/dL (ref 1.5–4.5)
Glucose: 93 mg/dL (ref 65–99)
Potassium: 4.4 mmol/L (ref 3.5–5.2)
Sodium: 141 mmol/L (ref 134–144)
Total Protein: 6.9 g/dL (ref 6.0–8.5)

## 2019-11-28 LAB — INSULIN, RANDOM: INSULIN: 10.3 u[IU]/mL (ref 2.6–24.9)

## 2019-11-28 LAB — HEMOGLOBIN A1C
Est. average glucose Bld gHb Est-mCnc: 108 mg/dL
Hgb A1c MFr Bld: 5.4 % (ref 4.8–5.6)

## 2019-11-28 LAB — VITAMIN D 25 HYDROXY (VIT D DEFICIENCY, FRACTURES): Vit D, 25-Hydroxy: 31.5 ng/mL (ref 30.0–100.0)

## 2019-11-28 LAB — LIPID PANEL WITH LDL/HDL RATIO
Cholesterol, Total: 201 mg/dL — ABNORMAL HIGH (ref 100–199)
HDL: 46 mg/dL (ref >39)
LDL Chol Calc (NIH): 132 mg/dL — ABNORMAL HIGH (ref 0–99)
LDL/HDL Ratio: 2.9 ratio (ref 0.0–3.2)
Triglycerides: 130 mg/dL (ref 0–149)
VLDL Cholesterol Cal: 23 mg/dL (ref 5–40)

## 2019-11-29 NOTE — Progress Notes (Signed)
Chief Complaint:   OBESITY [name] is here to discuss her progress with her obesity treatment plan along with follow-up of her obesity related diagnoses. [name] is on the Category 3 Plan and states she is following her eating plan approximately 75% of the time. [name] states she is is walking for 20-25 minutes 2-3 times per week.  Today's visit was #: 20 Starting weight: 235 lbs Starting date: 11/30/2018 Today's weight: 201 lbs Today's date: 11/27/2019 Total lbs lost to date: 34 Total lbs lost since last in-office visit: 0  Interim History: [name] gained weight over the holidays. She did more celebration eating and increased eating out. She was surprised at her 8 lb weight gain, but she states she is ready to get back on track. She has questions about oatmeal and grapefruit.  Subjective:   1. Pre-diabetes [name]'s A1c had improved on metformin. She denies nausea or vomiting, and she is due for labs.  2. At risk for diabetes mellitus [name] is at higher than average risk for developing diabetes due to her obesity.   3. Other depression [name] is stable on Topamax. She has gotten off track with her diet, but her motivation to change has improved.  4. Vitamin D deficiency [name] is stable on Vit D, but she is at risk of over-replacement.  Assessment/Plan:   1. Pre-diabetes [name] will continue to work on weight loss, exercise, and decreasing simple carbohydrates to help decrease the risk of diabetes. We will check labs today, and we will refill metformin for 1 month.  - Comprehensive Metabolic Panel (CMET) - HgB A1c - Insulin, random - Lipid Panel With LDL/HDL Ratio - metFORMIN (GLUCOPHAGE) 500 MG tablet; Take 1 tablet (500 mg total) by mouth 2 (two) times daily with a meal.  Dispense: 60 tablet; Refill: 0  2. At risk for diabetes mellitus [name] was given approximately 15 minutes of diabetes education and counseling today. We discussed intensive  lifestyle modifications today with an emphasis on weight loss as well as increasing exercise and decreasing simple carbohydrates in her diet. We also reviewed medication options with an emphasis on risk versus benefit of those discussed.   3. Other depression Behavior modification techniques were discussed today to help [name] deal with her emotional/non-hunger eating behaviors. We will refill Topamax for 1 month. Orders and follow up as documented in patient record.    - topiramate (TOPAMAX) 50 MG tablet; Take 1 tablet (50 mg total) by mouth at bedtime.  Dispense: 30 tablet; Refill: 0  4. Vitamin D deficiency Low Vitamin D level contributes to fatigue and are associated with obesity, breast, and colon cancer. We will refill prescription Vit D for 1 month and we will check labs today. She will follow-up for routine testing of vitamin D, at least 2-3 times per year to avoid over-replacement.  - Vitamin D (25 hydroxy) - Vitamin D, Ergocalciferol, (DRISDOL) 1.25 MG (50000 UNIT) CAPS capsule; Take 1 capsule (50,000 Units total) by mouth every 7 (seven) days.  Dispense: 4 capsule; Refill: 0  Obesity [name] is currently in the action stage of change. As such, her goal is to continue with weight loss efforts. She has agreed to on the Category 3 Plan.   We discussed the following exercise goals today: For substantial health benefits, adults should do at least 150 minutes (2 hours and 30 minutes) a week of moderate-intensity, or 75 minutes (1 hour and 15 minutes) a week of vigorous-intensity aerobic physical activity, or an equivalent  combination of moderate- and vigorous-intensity aerobic activity. Aerobic activity should be performed in episodes of at least 10 minutes, and preferably, it should be spread throughout the week. Adults should also include muscle-strengthening activities that involve all major muscle groups on 2 or more days a week.  We discussed the following behavioral modification  strategies today: decreasing eating out and no skipping meals.  [name] has agreed to follow-up with our clinic in 2 to 3 weeks. She was informed of the importance of frequent follow-up visits to maximize her success with intensive lifestyle modifications for her multiple health conditions.   [name] was informed we would discuss her lab results at her next visit unless there is a critical issue that needs to be addressed sooner. [name] agreed to keep her next visit at the agreed upon time to discuss these results.  Objective:   Blood pressure 131/80, pulse 69, temperature (!) 97.5 F (36.4 C), temperature source Oral, height 5\' 3"  (1.6 m), weight 201 lb (91.2 kg), SpO2 100 %. Body mass index is 35.61 kg/m.  General: Cooperative, alert, well developed, in no acute distress. HEENT: Conjunctivae and lids unremarkable. Neck: No thyromegaly.  Cardiovascular: Regular rhythm.  Lungs: Normal work of breathing. Extremities: No edema.  Neurologic: No focal deficits.   Lab Results  Component Value Date   CREATININE 0.62 11/27/2019   BUN 13 11/27/2019   NA 141 11/27/2019   K 4.4 11/27/2019   CL 106 11/27/2019   CO2 24 11/27/2019   Lab Results  Component Value Date   ALT 7 11/27/2019   AST 9 11/27/2019   ALKPHOS 73 11/27/2019   BILITOT 0.3 11/27/2019   Lab Results  Component Value Date   HGBA1C 5.4 11/27/2019   HGBA1C 5.4 06/27/2019   HGBA1C 6.0 (H) 11/30/2018   Lab Results  Component Value Date   INSULIN 10.3 11/27/2019   INSULIN 12.5 06/27/2019   INSULIN 14.5 11/30/2018   Lab Results  Component Value Date   TSH 4.280 11/30/2018   Lab Results  Component Value Date   CHOL 201 (H) 11/27/2019   HDL 46 11/27/2019   LDLCALC 132 (H) 11/27/2019   TRIG 130 11/27/2019   CHOLHDL 4.4 11/30/2018   Lab Results  Component Value Date   WBC 9.8 11/30/2018   HGB 13.6 11/30/2018   HCT 39.1 11/30/2018   MCV 87 11/30/2018   PLT 303 11/30/2018   No results found for:  IRON, TIBC, FERRITIN  Attestation Statements:   Reviewed by clinician on day of visit: allergies, medications, problem list, medical history, surgical history, family history, social history, and previous encounter notes.   I, Trixie Dredge, am acting as transcriptionist for Dennard Nip, MD.  I have reviewed the above documentation for accuracy and completeness, and I agree with the above. -  Dennard Nip, MD

## 2019-12-01 ENCOUNTER — Other Ambulatory Visit: Payer: Self-pay

## 2019-12-01 ENCOUNTER — Encounter: Payer: Self-pay | Admitting: Cardiovascular Disease

## 2019-12-01 ENCOUNTER — Ambulatory Visit: Payer: 59 | Admitting: Cardiovascular Disease

## 2019-12-01 VITALS — BP 116/74 | HR 88 | Ht 63.0 in | Wt 203.0 lb

## 2019-12-01 DIAGNOSIS — I5032 Chronic diastolic (congestive) heart failure: Secondary | ICD-10-CM

## 2019-12-01 DIAGNOSIS — R000 Tachycardia, unspecified: Secondary | ICD-10-CM | POA: Diagnosis not present

## 2019-12-01 DIAGNOSIS — I493 Ventricular premature depolarization: Secondary | ICD-10-CM | POA: Diagnosis not present

## 2019-12-01 DIAGNOSIS — I10 Essential (primary) hypertension: Secondary | ICD-10-CM

## 2019-12-01 MED ORDER — METOPROLOL TARTRATE 25 MG PO TABS: 25.0000 mg | ORAL_TABLET | Freq: Two times a day (BID) | ORAL | 3 refills | Status: DC

## 2019-12-01 MED FILL — METOPROLOL TARTRATE 25 MG T: 25 | 90 days supply | Qty: 180 | Fill #0

## 2019-12-01 NOTE — Patient Instructions (Signed)
Medication Instructions:  Your physician recommends that you continue on your current medications as directed. Please refer to the Current Medication list given to you today. A Refill of your metoprolol has been sent to your pharmacy *If you need a refill on your cardiac medications before your next appointment, please call your pharmacy*   Lab Work: None Ordered If you have labs (blood work) drawn today and your tests are completely normal, you will receive your results only by: Marland Kitchen MyChart Message (if you have MyChart) OR . A paper copy in the mail If you have any lab test that is abnormal or we need to change your treatment, we will call you to review the results.   Testing/Procedures: None Ordered   Follow-Up: At [facility], you and your health needs are our priority.  As part of our continuing mission to provide you with exceptional heart care, we have created designated Provider Care Teams.  These Care Teams include your primary Cardiologist (physician) and Advanced Practice Providers (APPs -  Physician Assistants and Nurse Practitioners) who all work together to provide you with the care you need, when you need it.  Your next appointment:   1 year(s)  The format for your next appointment:   In Person  Provider:   You may see Mertie Moores, MD or one of the following Advanced Practice Providers on your designated Care Team:    Richardson Dopp, PA-C  [place], [place]  Daune Perch, [place]

## 2019-12-01 NOTE — Progress Notes (Signed)
Cardiology Office Note   Date:  12/01/2019   ID:  [name] 04-14-1971, MRN [id number]  PCP:  Alison Frizzle, MD  Cardiologist:   Alison Moores, MD   Chief Complaint  Patient presents with  . Hypertension  . Palpitations   Problem List 1.  Diastolic dysfunction 2. Hypertension 3. Obesity 4. Palpitations  5. Obstructive sleep apnea - has not been using her CPAP, Needs to see a pulmonologist to get another mask.    [name] is a 49 y.o. female who presents for  Further evaluation of her shortness breath and palpitations. Has had palpitations for several months.   Seem to be worse at night , while watching TV.  Also has dyspnea - primarily at rest.   Not related to lying down. Has DOE Works out at [employer] , has been going for the past month. Has had shortness of breath since getting back to the gym.    Was given lasix 20 as needed - takes it once every 1-2 months.   Seems to need to take the lasix after she has eaten more salty foods than she should .    She knows that she should limit salt intake but has not made any real effort.   Has lost 4 lbs since Jan. 1.  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    Husband is a Hotel manager.   2 boys at home,  Age 83 and 67.   Sept. 7, 2017: [name] is seen for a follow up visit This past Saturday ,  Has had some hot flashes / sweats  HR has been very high for the past several weeks.  (is on adderall)  No diarrhea. No weight loss.   Has not been using her CPAP mask - ends up taking the CPAP mask off after 15 minutes.   Oct. 9, 2017:  [name] is seen back for follow up of Her chronic diastolic congestive heart failure. She was seen in Sept.  We stopped the Bystolic and started Metoprolol 50 BID  Has been checking BP , Now works at [employer].   Mar 17, 2018: [name] is seen back today for further evaluation of her sinus tachycardia and diastolic heart  failure. Had partial left knee replacement [name] )  Occasional episodes of shortness of breath.   Is walking at lunch - gets a little short of breath  Is making progress with her walking   Wt today is 228 lbs.   December 01, 2019:  [name] is seen back today for follow-up of her palpitations and chronic diastolic congestive heart failure. Wt. Today is 203 lbs ( down 25 lbs from last visit)  Rare CP.   Some palpitations  Works at [employer] ( now owned by [employer])  Walking regularly ,   Working on diet .   Is having a new CPAP mask made.    Past Medical History:  Diagnosis Date  . Anxiety   . Asthma   . Back pain   . Chest pain   . Chronic CHF ([name])   . Dyspnea   . Echocardiogram 10/25/2015   EF 60-65, normal wall motion, grade 1 diastolic dysfunction, GLS -19.1%  . Edema of left lower extremity   . Fracture dislocation of elbow joint 01/15/2013   right  . GERD (gastroesophageal reflux disease)    Omeprazole as needed, none in 6 mos.  Marland Kitchen  Hyperlipidemia   . Hypertension   . Joint pain   . Obesity   . Palpitations   . Prediabetes   . R/L Cardiac catheterization 08/18/2018   Normal coronary arteries, mean RA 6, mean PA 17, PCWP 10  . Seborrheic dermatitis   . Sleep apnea    uses CPAP "most nights"    Past Surgical History:  Procedure Laterality Date  . [place]; 10/10/2000   x 2  . KNEE ARTHROSCOPY Left 1991   also in 2019  . LAPAROSCOPIC ASSISTED VAGINAL HYSTERECTOMY  11/02/2006  . LAPAROSCOPIC LYSIS OF ADHESIONS  03/06/2005  . RADIAL HEAD ARTHROPLASTY Right 01/17/2013   Procedure: RIGHT RADIAL HEAD ARTHROPLASTY OPEN REDUCTION INTERNAL FIXATION CORONOID ;  Surgeon: Alison Must, MD;  Location: [facility];  Service: Orthopedics;  Laterality: Right;  . RIGHT/LEFT HEART CATH AND CORONARY ANGIOGRAPHY N/A 08/18/2018   Procedure: RIGHT/LEFT HEART CATH AND CORONARY ANGIOGRAPHY;  Surgeon: Alison Bush, MD;  Location: [facility];  Service: Cardiovascular;  Laterality: N/A;  . TUBAL LIGATION  03/06/2005     Current Outpatient Medications  Medication Sig Dispense Refill  . albuterol (PROVENTIL HFA;VENTOLIN HFA) 108 (90 Base) MCG/ACT inhaler Inhale 2 puffs into the lungs every 6 (six) hours as needed for wheezing. 1 Inhaler 11  . ALPRAZolam (XANAX) 0.5 MG tablet Take 1 tablet (0.5 mg total) by mouth 3 (three) times daily as needed. 45 tablet 0  . amphetamine-dextroamphetamine (ADDERALL XR) 25 MG 24 hr capsule Take 1 capsule by mouth every morning. 30 capsule 0  . furosemide (LASIX) 20 MG tablet Take 2 tablets (40 mg total) by mouth daily as needed (swelling). 60 tablet 2  . ibuprofen (ADVIL,MOTRIN) 200 MG tablet Take 400 mg by mouth 2 (two) times daily as needed for headache or moderate pain.    . metFORMIN (GLUCOPHAGE) 500 MG tablet Take 1 tablet (500 mg total) by mouth 2 (two) times daily with a meal. 60 tablet 0  . metoprolol tartrate (LOPRESSOR) 25 MG tablet Take 1 tablet (25 mg total) by mouth 2 (two) times daily. 180 tablet 3  . nitroGLYCERIN (NITROSTAT) 0.4 MG SL tablet Place 1 tablet (0.4 mg total) under the tongue every 5 (five) minutes as needed for chest pain. 25 tablet 3  . topiramate (TOPAMAX) 50 MG tablet Take 1 tablet (50 mg total) by mouth at bedtime. 30 tablet 0  . Vitamin D, Ergocalciferol, (DRISDOL) 1.25 MG (50000 UNIT) CAPS capsule Take 1 capsule (50,000 Units total) by mouth every 7 (seven) days. 4 capsule 0   No current facility-administered medications for this visit.    Allergies:   Adhesive [tape] and Latex    Social History:  The patient  reports that she has never smoked. She has never used smokeless tobacco. She reports that she does not drink alcohol or use drugs.   Family History:  The patient's family history includes Diabetes in her maternal grandmother, mother, paternal grandmother, and sister; Heart disease in her maternal grandfather, maternal grandmother, maternal uncle, and  mother; Heart disease (age of onset: 90) in her father; Hypertension in her mother; Liver disease in her paternal aunt; Obesity in her mother; Ovarian cancer in her maternal grandmother; Sudden death in her father.    ROS:   Noted in current history, otherwise review of systems is negative.   Physical Exam: Blood pressure 116/74, pulse 88, height 5\' 3"  (1.6 m), weight 203 lb (92.1 kg), SpO2 99 %.  GEN:  Well nourished, well developed in no acute distress HEENT: Normal NECK: No JVD; No carotid bruits LYMPHATICS: No lymphadenopathy CARDIAC: RRR , no murmurs, rubs, gallops RESPIRATORY:  Clear to auscultation without rales, wheezing or rhonchi  ABDOMEN: Soft, non-tender, non-distended MUSCULOSKELETAL:  No edema; No deformity  SKIN: Warm and dry NEUROLOGIC:  Alert and oriented x 3   EKG:   December 01, 2019: Normal sinus rhythm at 88.  No ST or T wave changes.  Recent Labs: 11/27/2019: ALT 7; BUN 13; Creatinine, Ser 0.62; Potassium 4.4; Sodium 141    Lipid Panel    Component Value Date/Time   CHOL 201 (H) 11/27/2019 1221   TRIG 130 11/27/2019 1221   HDL 46 11/27/2019 1221   CHOLHDL 4.4 11/30/2018 1047   CHOLHDL 4.2 12/24/2017 0818   VLDL 22 03/11/2015 0847   LDLCALC 132 (H) 11/27/2019 1221   LDLCALC 134 (H) 12/24/2017 0818      Wt Readings from Last 3 Encounters:  12/01/19 203 lb (92.1 kg)  11/27/19 201 lb (91.2 kg)  10/19/19 193 lb (87.5 kg)      Other studies Reviewed: Additional studies/ records that were reviewed today include: . Review of the above records demonstrates:    ASSESSMENT AND PLAN:  1.   Palpitations-   [name] continues to do well.  Her palpitations are very minimal.  Continue current dose of metoprolol.   2.  Chronic diastolic congestive heart failure: Her episodes of shortness of breath have significant improved.  She is lost 25 pounds since I last saw her 2 years ago.  I encouraged her to continue with her weight loss program.  I will see her  again in 1 year.  Current medicines are reviewed at length with the patient today.  The patient does not have concerns regarding medicines.  The following changes have been made:  no change  Labs/ tests ordered today include:   Orders Placed This Encounter  Procedures  . EKG 12-Lead           Alison Moores, MD  12/01/2019 1:34 PM    [facility] [address], [place], [place]  [address] Phone: [phone]; Fax: [phone]

## 2019-12-11 ENCOUNTER — Encounter (INDEPENDENT_AMBULATORY_CARE_PROVIDER_SITE_OTHER): Payer: Self-pay

## 2019-12-13 ENCOUNTER — Other Ambulatory Visit: Payer: Self-pay

## 2019-12-13 ENCOUNTER — Encounter (INDEPENDENT_AMBULATORY_CARE_PROVIDER_SITE_OTHER): Payer: Self-pay | Admitting: Family Medicine

## 2019-12-13 ENCOUNTER — Ambulatory Visit (INDEPENDENT_AMBULATORY_CARE_PROVIDER_SITE_OTHER): Payer: 59 | Admitting: Family Medicine

## 2019-12-13 VITALS — BP 116/76 | HR 89 | Temp 98.2°F | Ht 63.0 in | Wt 197.0 lb

## 2019-12-13 DIAGNOSIS — E559 Vitamin D deficiency, unspecified: Secondary | ICD-10-CM

## 2019-12-13 DIAGNOSIS — E88819 Insulin resistance, unspecified: Secondary | ICD-10-CM

## 2019-12-13 DIAGNOSIS — Z6835 Body mass index (BMI) 35.0-35.9, adult: Secondary | ICD-10-CM | POA: Diagnosis not present

## 2019-12-13 DIAGNOSIS — E66812 Obesity, class 2: Secondary | ICD-10-CM

## 2019-12-13 DIAGNOSIS — Z9189 Other specified personal risk factors, not elsewhere classified: Secondary | ICD-10-CM

## 2019-12-13 DIAGNOSIS — E8881 Metabolic syndrome: Secondary | ICD-10-CM | POA: Diagnosis not present

## 2019-12-14 MED ORDER — VITAMIN D (ERGOCALCIFEROL) 1.25 MG (50000 UNIT) PO CAPS: 50000.0000 [IU] | ORAL_CAPSULE | ORAL | 0 refills | Status: DC

## 2019-12-14 NOTE — Progress Notes (Signed)
Chief Complaint:   OBESITY [name] is here to discuss her progress with her obesity treatment plan along with follow-up of her obesity related diagnoses. [name] is on the Category 3 Plan and states she is following her eating plan approximately 95% of the time. [name] states she is walking for 20-25 minutes 4 times per week.  Today's visit was #: 21 Starting weight: 235 lbs Starting date: 11/30/2018 Today's weight: 197 lbs Today's date: 12/13/2019 Total lbs lost to date: 38 Total lbs lost since last in-office visit: 4  Interim History: [name] continues to do well with Category 3 plan. Her hunger is controlled, but she struggles to eat all of her dinner protein some nights.  Subjective:   1. Vitamin D deficiency [name]'s Vit D level worsening, dropped from her summer high level. She is doing well remembering to take all of her Vit D doses. I discussed labs with the patient today.  2. Insulin resistance [name]'s A1c and glucose are within normal limits, and her fasting insulin has improved on her diet prescription. She notes decreased polyphagia. I discussed labs with the patient today.  3. At risk for malnutrition [name] is at increased risk for malnutrition due to protein malnutrition.   Assessment/Plan:   1. Vitamin D deficiency Low Vitamin D level contributes to fatigue and are associated with obesity, breast, and colon cancer. We will refill prescription Vitamin D for 1 month, and we will recheck labs in 3 month. [name] will follow-up for routine testing of Vitamin D, at least 2-3 times per year to avoid over-replacement.  - Vitamin D, Ergocalciferol, (DRISDOL) 1.25 MG (50000 UNIT) CAPS capsule; Take 1 capsule (50,000 Units total) by mouth every 7 (seven) days.  Dispense: 4 capsule; Refill: 0  2. Insulin resistance [name] will continue to work on weight loss, diet, exercise, and decreasing simple carbohydrates to help decrease the risk of diabetes. [name] agreed to  follow-up with [name] as directed to closely monitor her progress.  3. At risk for malnutrition [name] was given approximately 15 minutes of counseling today regarding prevention of malnutrition. [name] was advised to increase lean protein options in the evenings.   4. Class 2 severe obesity with serious comorbidity and body mass index (BMI) of 35.0 to 35.9 in adult, unspecified obesity type (HCC) [name] is currently in the action stage of change. As such, her goal is to continue with weight loss efforts. She has agreed to the Category 3 Plan, lean meat equivalents.   Exercise goals: As is  Behavioral modification strategies: increasing lean protein intake and decreasing simple carbohydrates.  [name] has agreed to follow-up with our clinic in 2 to 3 weeks. She was informed of the importance of frequent follow-up visits to maximize her success with intensive lifestyle modifications for her multiple health conditions.   Objective:   Blood pressure 116/76, pulse 89, temperature 98.2 F (36.8 C), temperature source Oral, height 5\' 3"  (1.6 m), weight 197 lb (89.4 kg), SpO2 99 %. Body mass index is 34.9 kg/m.  General: Cooperative, alert, well developed, in no acute distress. HEENT: Conjunctivae and lids unremarkable. Cardiovascular: Regular rhythm.  Lungs: Normal work of breathing. Neurologic: No focal deficits.   Lab Results  Component Value Date   CREATININE 0.62 11/27/2019   BUN 13 11/27/2019   NA 141 11/27/2019   K 4.4 11/27/2019   CL 106 11/27/2019   CO2 24 11/27/2019   Lab Results  Component Value Date   ALT 7 11/27/2019   AST  9 11/27/2019   ALKPHOS 73 11/27/2019   BILITOT 0.3 11/27/2019   Lab Results  Component Value Date   HGBA1C 5.4 11/27/2019   HGBA1C 5.4 06/27/2019   HGBA1C 6.0 (H) 11/30/2018   Lab Results  Component Value Date   INSULIN 10.3 11/27/2019   INSULIN 12.5 06/27/2019   INSULIN 14.5 11/30/2018   Lab Results  Component Value Date   TSH 4.280  11/30/2018   Lab Results  Component Value Date   CHOL 201 (H) 11/27/2019   HDL 46 11/27/2019   LDLCALC 132 (H) 11/27/2019   TRIG 130 11/27/2019   CHOLHDL 4.4 11/30/2018   Lab Results  Component Value Date   WBC 9.8 11/30/2018   HGB 13.6 11/30/2018   HCT 39.1 11/30/2018   MCV 87 11/30/2018   PLT 303 11/30/2018   No results found for: IRON, TIBC, FERRITIN  Attestation Statements:   Reviewed by clinician on day of visit: allergies, medications, problem list, medical history, surgical history, family history, social history, and previous encounter notes.   I, Trixie Dredge, am acting as transcriptionist for Dennard Nip, MD.  I have reviewed the above documentation for accuracy and completeness, and I agree with the above. -  Dennard Nip, MD

## 2019-12-19 MED FILL — VIT D2 1.25 MG (50,000 UNIT: 1.25 MG | 28 days supply | Qty: 4 | Fill #0

## 2020-01-01 ENCOUNTER — Other Ambulatory Visit: Payer: Self-pay

## 2020-01-01 ENCOUNTER — Encounter (INDEPENDENT_AMBULATORY_CARE_PROVIDER_SITE_OTHER): Payer: Self-pay | Admitting: Family Medicine

## 2020-01-01 ENCOUNTER — Ambulatory Visit (INDEPENDENT_AMBULATORY_CARE_PROVIDER_SITE_OTHER): Payer: 59 | Admitting: Family Medicine

## 2020-01-01 VITALS — BP 127/79 | HR 84 | Temp 98.1°F | Ht 63.0 in | Wt 203.0 lb

## 2020-01-01 DIAGNOSIS — Z6836 Body mass index (BMI) 36.0-36.9, adult: Secondary | ICD-10-CM

## 2020-01-01 DIAGNOSIS — E8881 Metabolic syndrome: Secondary | ICD-10-CM | POA: Diagnosis not present

## 2020-01-01 DIAGNOSIS — Z9189 Other specified personal risk factors, not elsewhere classified: Secondary | ICD-10-CM | POA: Diagnosis not present

## 2020-01-02 NOTE — Progress Notes (Signed)
Chief Complaint:   OBESITY [name] is here to discuss her progress with her obesity treatment plan along with follow-up of her obesity related diagnoses. [name] is on the Category 3 Plan and states she is following her eating plan approximately 90-95% of the time. [name] states she is walking for 20-25 minutes 5 times per week.  Today's visit was #: 22 Starting weight: 235 lbs Starting date: 11/30/2018 Today's weight: 203 lbs Today's date: 01/01/2020 Total lbs lost to date: 32 Total lbs lost since last in-office visit: 0  Interim History: Kalanni is struggling with weight loss. She hasn't been sleeping well and she has increased stress in her life. Overall she has done well with her Category 3 plan.  Subjective:   1. Insulin resistance [name] is tolerating metformin. She wonders if her metformin is worsening her restless leg syndrome.  2. At risk for medication nonadherence [name] is at a higher than average risk for medication non-adherence due to possible side effects.  Assessment/Plan:   1. Insulin resistance [name] will continue to work on weight loss, exercise, and decreasing simple carbohydrates to help decrease the risk of diabetes. [name] was advised that metformin is not likely worsening her restless leg syndrome, but could be depleting her B12. She was encouraged to take OTC B12 1,000 mg daily. [name] agreed to follow-up with [name] as directed to closely monitor her progress.  2. At risk for medication nonadherence [name] was given approximately 15 minutes of counseling today to help her avoid medication non-adherence. We discussed importance of taking medications at a similar time each day and the use of daily pill organizers to help improve medication adherence. We will continue to monitor.  Repetitive spaced learning was employed today to elicit superior memory formation and behavioral change.  3. Class 2 severe obesity with serious comorbidity and body mass  index (BMI) of 36.0 to 36.9 in adult, unspecified obesity type (HCC) [name] is currently in the action stage of change. As such, her goal is to continue with weight loss efforts. She has agreed to the Category 3 Plan.   [name] is to work on improving her sleep. She may need to discuss with her primary care physician if she is still struggling.  Exercise goals: [name] will continue her current exercise regimen as is.  Behavioral modification strategies: increasing lean protein intake and meal planning and cooking strategies.  [name] has agreed to follow-up with our clinic in 3 weeks. She was informed of the importance of frequent follow-up visits to maximize her success with intensive lifestyle modifications for her multiple health conditions.   Objective:   Blood pressure 127/79, pulse 84, temperature 98.1 F (36.7 C), temperature source Oral, height 5\' 3"  (1.6 m), weight 203 lb (92.1 kg), SpO2 98 %. Body mass index is 35.96 kg/m.  General: Cooperative, alert, well developed, in no acute distress. HEENT: Conjunctivae and lids unremarkable. Cardiovascular: Regular rhythm.  Lungs: Normal work of breathing. Neurologic: No focal deficits.   Lab Results  Component Value Date   CREATININE 0.62 11/27/2019   BUN 13 11/27/2019   NA 141 11/27/2019   K 4.4 11/27/2019   CL 106 11/27/2019   CO2 24 11/27/2019   Lab Results  Component Value Date   ALT 7 11/27/2019   AST 9 11/27/2019   ALKPHOS 73 11/27/2019   BILITOT 0.3 11/27/2019   Lab Results  Component Value Date   HGBA1C 5.4 11/27/2019   HGBA1C 5.4 06/27/2019   HGBA1C 6.0 (H) 11/30/2018  Lab Results  Component Value Date   INSULIN 10.3 11/27/2019   INSULIN 12.5 06/27/2019   INSULIN 14.5 11/30/2018   Lab Results  Component Value Date   TSH 4.280 11/30/2018   Lab Results  Component Value Date   CHOL 201 (H) 11/27/2019   HDL 46 11/27/2019   LDLCALC 132 (H) 11/27/2019   TRIG 130 11/27/2019   CHOLHDL 4.4 11/30/2018     Lab Results  Component Value Date   WBC 9.8 11/30/2018   HGB 13.6 11/30/2018   HCT 39.1 11/30/2018   MCV 87 11/30/2018   PLT 303 11/30/2018   No results found for: IRON, TIBC, FERRITIN  Attestation Statements:   Reviewed by clinician on day of visit: allergies, medications, problem list, medical history, surgical history, family history, social history, and previous encounter notes.   I, Trixie Dredge, am acting as transcriptionist for Dennard Nip, MD.  I have reviewed the above documentation for accuracy and completeness, and I agree with the above. -  Dennard Nip, MD

## 2020-01-03 ENCOUNTER — Encounter (INDEPENDENT_AMBULATORY_CARE_PROVIDER_SITE_OTHER): Payer: Self-pay | Admitting: Family Medicine

## 2020-01-03 NOTE — Telephone Encounter (Signed)
Please advise 

## 2020-01-08 ENCOUNTER — Other Ambulatory Visit: Payer: Self-pay | Admitting: Family Medicine

## 2020-01-09 MED ORDER — AMPHETAMINE-DEXTROAMPHET ER 25 MG PO CP24: 25.0000 mg | ORAL_CAPSULE | ORAL | 0 refills | Status: DC

## 2020-01-09 MED FILL — ADDERALL XR 25 MG CAPSULE: 25 | 30 days supply | Qty: 30 | Fill #0

## 2020-01-09 NOTE — Addendum Note (Signed)
Addended by: Jenna Luo T on: 01/09/2020 01:58 PM   Modules accepted: Orders

## 2020-01-09 NOTE — Telephone Encounter (Signed)
Pt scheduled for 01/18/20 - can you refill her rx?  (LOV - 01/14/20)

## 2020-01-09 NOTE — Telephone Encounter (Signed)
Ok to refill??  Last office visit 01/13/2019.  Last refill 11/24/2019.

## 2020-01-15 ENCOUNTER — Ambulatory Visit: Payer: 59 | Admitting: Cardiovascular Disease

## 2020-01-18 ENCOUNTER — Ambulatory Visit (INDEPENDENT_AMBULATORY_CARE_PROVIDER_SITE_OTHER): Payer: 59 | Admitting: Family Medicine

## 2020-01-18 ENCOUNTER — Encounter: Payer: Self-pay | Admitting: Family Medicine

## 2020-01-18 ENCOUNTER — Other Ambulatory Visit: Payer: Self-pay

## 2020-01-18 VITALS — BP 140/80 | HR 82 | Temp 96.8°F | Resp 14 | Ht 63.0 in | Wt 207.0 lb

## 2020-01-18 DIAGNOSIS — F900 Attention-deficit hyperactivity disorder, predominantly inattentive type: Secondary | ICD-10-CM | POA: Diagnosis not present

## 2020-01-18 DIAGNOSIS — H9193 Unspecified hearing loss, bilateral: Secondary | ICD-10-CM

## 2020-01-18 NOTE — Progress Notes (Signed)
Subjective:    Patient ID: [name], female    DOB: [birth year], 49 y.o.   MRN: [id number]  HPI  01/13/19 Patient was previously seeing my partner for ADD.  My partner has since retired.  She is here today for refill on her Adderall.  Originally she was prescribed Adderall 20 mg twice daily however she was seeing cardiology for palpitations and they recommended discontinuing the Adderall.  The patient compromised and decreased her Adderall to 20 mg once daily.  Without the medication she is unable to focus at work.  She is unable to complete assigned task in a timely manner.  She makes careless errors.  She does not believe that she will be able to function without the medication and continue doing her current job.  She is easily distracted.  She denies any anxiety due to the medication.  She denies any insomnia however the medication does tend to wear off too quickly.  She can definitely feel when the medicine wears off.  She is also taking metoprolol supposed to be on 100 mg twice daily for palpitations.  However she frequently forgets to take her evening dose and therefore is only taking 100 mg once a day.  She does notice palpitations particularly at worse at work and in the afternoons.  She also has a history of prediabetes.  Most recent hemoglobin A1c was 6.0.  She is going to a nutritionist.  They are currently working with her on dietary changes.  She is not exercising due to pain in her knee.  She has a history of diastolic dysfunction.  Blood pressure today is well controlled at 110/70.  At that time, my plan was: I have recommended switching Adderall to extended release 20 mg once daily.  I believe that the immediate release Adderall likely exacerbates her palpitations and also has a shorter half-life and therefore does not last as long.  By switching to the equivalent dose but with an extended relief hopefully the palpitations will improve and she will see a better duration of treatment  with less of a "crash".  Also explained to the patient that if she is only taking metoprolol once a day, for 12 hours, she is essentially on no medication and this is likely exacerbating her palpitations.  Therefore I recommended that she either take the medication twice a day as directed or discussed with her cardiologist switching to an extended release that would be easier for her to take.  Patient has prediabetes.  She is currently working with a nutritionist and a bariatric specialist to better manage her blood sugars.  I will defer to their judgment.  Her blood pressure today is well controlled.  No further changes are necessary.  I did refill her Lasix which she uses sparingly for leg swelling  01/18/20 Patient is a 49 year old Caucasian female who has a history of ADD.  She is here today for regular follow-up.  She also reports mild hearing loss.  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  She had a hearing screen performed today and she has hearing deficiency at low-frequency and high-frequency requiring 40 dB before she can hear the tones.  She denies any ear pain.  She denies any vertigo.  She does occasionally have tinnitus.  On examination today tympanic membranes bilaterally are healthy, pearly gray, with no middle ear effusion.  There is no puncture wounds or trauma  to the eardrum.  There are no obstructions in the auditory canal.  Patient denies any history of trauma to the ear canal.  She denies any noise damage.  She would like to see an audiologist for further evaluation because she is having a difficult time hearing at work.  She is currently on Adderall XR 25 mg p.o. every morning.  The medication seems to be working well.  She denies any insomnia.  She denies any palpitations.  She denies any chest pain or shortness of breath.  She is seeing a nutrition specialist who has her on Metformin as well as  Topamax to assist in weight loss.  We did discuss today possibly switching to Vyvanse for binge eating disorder as a means to also help curb appetite and facilitate weight loss.  However at the present time patient is hesitant to make any changes. Past Medical History:  Diagnosis Date  . Anxiety   . Asthma   . Back pain   . Chest pain   . Chronic CHF ([place])   . Dyspnea   . Echocardiogram 10/25/2015   EF 60-65, normal wall motion, grade 1 diastolic dysfunction, GLS -19.1%  . Edema of left lower extremity   . Fracture dislocation of elbow joint 01/15/2013   right  . GERD (gastroesophageal reflux disease)    Omeprazole as needed, none in 6 mos.  . Hyperlipidemia   . Hypertension   . Joint pain   . Obesity   . Palpitations   . Prediabetes   . R/L Cardiac catheterization 08/18/2018   Normal coronary arteries, mean RA 6, mean PA 17, PCWP 10  . Seborrheic dermatitis   . Sleep apnea    uses CPAP "most nights"   Past Surgical History:  Procedure Laterality Date  . [place]; 10/10/2000   x 2  . KNEE ARTHROSCOPY Left 1991   also in 2019  . LAPAROSCOPIC ASSISTED VAGINAL HYSTERECTOMY  11/02/2006  . LAPAROSCOPIC LYSIS OF ADHESIONS  03/06/2005  . RADIAL HEAD ARTHROPLASTY Right 01/17/2013   Procedure: RIGHT RADIAL HEAD ARTHROPLASTY OPEN REDUCTION INTERNAL FIXATION CORONOID ;  Surgeon: Tennis Must, MD;  Location: [facility];  Service: Orthopedics;  Laterality: Right;  . RIGHT/LEFT HEART CATH AND CORONARY ANGIOGRAPHY N/A 08/18/2018   Procedure: RIGHT/LEFT HEART CATH AND CORONARY ANGIOGRAPHY;  Surgeon: Nelva Bush, MD;  Location: [facility];  Service: Cardiovascular;  Laterality: N/A;  . TUBAL LIGATION  03/06/2005   Current Outpatient Medications on File Prior to Visit  Medication Sig Dispense Refill  . albuterol (PROVENTIL HFA;VENTOLIN HFA) 108 (90 Base) MCG/ACT inhaler Inhale 2 puffs into the lungs every 6 (six) hours as needed for wheezing. 1 Inhaler 11   . ALPRAZolam (XANAX) 0.5 MG tablet Take 1 tablet (0.5 mg total) by mouth 3 (three) times daily as needed. 45 tablet 0  . amphetamine-dextroamphetamine (ADDERALL XR) 25 MG 24 hr capsule Take 1 capsule by mouth every morning. 30 capsule 0  . furosemide (LASIX) 20 MG tablet Take 2 tablets (40 mg total) by mouth daily as needed (swelling). 60 tablet 2  . ibuprofen (ADVIL,MOTRIN) 200 MG tablet Take 400 mg by mouth 2 (two) times daily as needed for headache or moderate pain.    . metFORMIN (GLUCOPHAGE) 500 MG tablet Take 1 tablet (500 mg total) by mouth 2 (two) times daily with a meal. 60 tablet 0  . metoprolol tartrate (LOPRESSOR) 25 MG tablet Take 1 tablet (25 mg total) by mouth 2 (  two) times daily. 180 tablet 3  . nitroGLYCERIN (NITROSTAT) 0.4 MG SL tablet Place 1 tablet (0.4 mg total) under the tongue every 5 (five) minutes as needed for chest pain. 25 tablet 3  . topiramate (TOPAMAX) 50 MG tablet Take 1 tablet (50 mg total) by mouth at bedtime. 30 tablet 0  . Vitamin D, Ergocalciferol, (DRISDOL) 1.25 MG (50000 UNIT) CAPS capsule Take 1 capsule (50,000 Units total) by mouth every 7 (seven) days. 4 capsule 0   No current facility-administered medications on file prior to visit.   Allergies  Allergen Reactions  . Adhesive [Tape] Rash  . Latex Rash   Social History   Socioeconomic History  . Marital status: Married    Spouse name: Kapree Middlesworth  . Number of children: 2  . Years of education: Not on file  . Highest education level: Not on file  Occupational History  . Occupation: CMA    Employer: [employer]  Tobacco Use  . Smoking status: Never Smoker  . Smokeless tobacco: Never Used  Substance and Sexual Activity  . Alcohol use: No  . Drug use: No  . Sexual activity: Not on file  Other Topics Concern  . Not on file  Social History Narrative  . Not on file   Social Determinants of Health   Financial Resource Strain:   . Difficulty of Paying Living Expenses: Not on file  Food  Insecurity:   . Worried About Charity fundraiser in the Last Year: Not on file  . Ran Out of Food in the Last Year: Not on file  Transportation Needs:   . Lack of Transportation (Medical): Not on file  . Lack of Transportation (Non-Medical): Not on file  Physical Activity:   . Days of Exercise per Week: Not on file  . Minutes of Exercise per Session: Not on file  Stress:   . Feeling of Stress : Not on file  Social Connections:   . Frequency of Communication with Friends and Family: Not on file  . Frequency of Social Gatherings with Friends and Family: Not on file  . Attends Religious Services: Not on file  . Active Member of Clubs or Organizations: Not on file  . Attends Archivist Meetings: Not on file  . Marital Status: Not on file  Intimate Partner Violence:   . Fear of Current or Ex-Partner: Not on file  . Emotionally Abused: Not on file  . Physically Abused: Not on file  . Sexually Abused: Not on file     Review of Systems  All other systems reviewed and are negative.      Objective:   Physical Exam Vitals reviewed.  Constitutional:      Appearance: She is obese.  HENT:     Right Ear: Tympanic membrane and ear canal normal.     Left Ear: Tympanic membrane and ear canal normal.  Cardiovascular:     Rate and Rhythm: Normal rate and regular rhythm.     Pulses: Normal pulses.     Heart sounds: Normal heart sounds. No murmur. No friction rub. No gallop.   Pulmonary:     Effort: No respiratory distress.     Breath sounds: Normal breath sounds. No stridor. No wheezing, rhonchi or rales.  Chest:     Chest wall: No tenderness.  Abdominal:     General: Bowel sounds are normal. There is no distension.     Palpations: Abdomen is soft.     Tenderness: There is  no abdominal tenderness. There is no right CVA tenderness, left CVA tenderness, guarding or rebound.  Musculoskeletal:     Right lower leg: No edema.     Left lower leg: No edema.  Neurological:      Mental Status: She is alert.         Assessment & Plan:  Hearing loss associated with syndrome, bilateral - Plan: Ambulatory referral to Audiology  Attention deficit hyperactivity disorder (ADHD), predominantly inattentive type  Consult audiology regarding mild hearing loss in both ears.  At the present time I do not feel the patient would benefit from a hearing aid but I would like further evaluation to ensure that there is not an underlying cause for her hearing loss other than premature presbycusis.  Currently her ADHD is well controlled on Adderall XR 25 mg p.o. every morning.  I suggested switching to Vyvanse, 60 mg p.o. every morning to help manage ADHD as well as potentially help with appetite suppression and binge eating disorder.  Patient elects to continue her current medications at the present dosages although she will discuss this with her nutrition specialist.

## 2020-01-19 MED FILL — metFORMIN HCL 500 MG TABS: 500 | 30 days supply | Qty: 60 | Fill #0

## 2020-01-22 ENCOUNTER — Other Ambulatory Visit (INDEPENDENT_AMBULATORY_CARE_PROVIDER_SITE_OTHER): Payer: Self-pay

## 2020-01-22 ENCOUNTER — Encounter (INDEPENDENT_AMBULATORY_CARE_PROVIDER_SITE_OTHER): Payer: Self-pay | Admitting: Family Medicine

## 2020-01-22 DIAGNOSIS — E559 Vitamin D deficiency, unspecified: Secondary | ICD-10-CM

## 2020-01-22 DIAGNOSIS — R7303 Prediabetes: Secondary | ICD-10-CM

## 2020-01-22 MED ORDER — VITAMIN D (ERGOCALCIFEROL) 1.25 MG (50000 UNIT) PO CAPS: 50000.0000 [IU] | ORAL_CAPSULE | ORAL | 0 refills | Status: DC

## 2020-01-22 MED ORDER — METFORMIN HCL 500 MG PO TABS: 500.0000 mg | ORAL_TABLET | Freq: Two times a day (BID) | ORAL | 0 refills | Status: DC

## 2020-01-22 MED FILL — ADDERALL XR 25 MG CAPSULE: 25 | 30 days supply | Qty: 30 | Fill #0

## 2020-01-22 MED FILL — VIT D2 1.25 MG (50,000 UNIT: 1.25 MG | 28 days supply | Qty: 4 | Fill #0

## 2020-01-29 ENCOUNTER — Other Ambulatory Visit: Payer: Self-pay | Admitting: Family Medicine

## 2020-01-29 MED ORDER — METHYLPREDNISOLONE 4 MG PO TBPK: ORAL_TABLET | ORAL | 0 refills | Status: DC

## 2020-01-30 DIAGNOSIS — H1045 Other chronic allergic conjunctivitis: Secondary | ICD-10-CM | POA: Diagnosis not present

## 2020-01-30 MED FILL — PREDNISOLONE AC 1% EYE DROP: 1 | 14 days supply | Qty: 10 | Fill #0

## 2020-02-01 ENCOUNTER — Encounter (INDEPENDENT_AMBULATORY_CARE_PROVIDER_SITE_OTHER): Payer: Self-pay | Admitting: Family Medicine

## 2020-02-01 ENCOUNTER — Other Ambulatory Visit: Payer: Self-pay

## 2020-02-01 ENCOUNTER — Ambulatory Visit (INDEPENDENT_AMBULATORY_CARE_PROVIDER_SITE_OTHER): Payer: 59 | Admitting: Family Medicine

## 2020-02-01 VITALS — BP 135/84 | HR 82 | Temp 97.7°F | Ht 63.0 in | Wt 204.0 lb

## 2020-02-01 DIAGNOSIS — Z6836 Body mass index (BMI) 36.0-36.9, adult: Secondary | ICD-10-CM

## 2020-02-01 DIAGNOSIS — I10 Essential (primary) hypertension: Secondary | ICD-10-CM | POA: Diagnosis not present

## 2020-02-01 DIAGNOSIS — R7303 Prediabetes: Secondary | ICD-10-CM

## 2020-02-01 DIAGNOSIS — Z9189 Other specified personal risk factors, not elsewhere classified: Secondary | ICD-10-CM | POA: Diagnosis not present

## 2020-02-01 DIAGNOSIS — E559 Vitamin D deficiency, unspecified: Secondary | ICD-10-CM

## 2020-02-01 MED ORDER — VITAMIN D (ERGOCALCIFEROL) 1.25 MG (50000 UNIT) PO CAPS: 50000.0000 [IU] | ORAL_CAPSULE | ORAL | 0 refills | Status: DC

## 2020-02-02 NOTE — Progress Notes (Signed)
Chief Complaint:   OBESITY [name] is here to discuss her progress with her obesity treatment plan along with follow-up of her obesity related diagnoses. [name] is on the Category 3 Plan and states she is following her eating plan approximately 80-85% of the time. Taygan states she is walking for 20-25 minutes 4-5 times per week.  Today's visit was #: 23 Starting weight: 235 lbs Starting date: 11/30/2018 Today's weight: 204 lbs Today's date: 02/01/2020 Total lbs lost to date: 31 Total lbs lost since last in-office visit: 0  Interim History: [name] states "I am at a standstill". She feels most challenged with dinner. She is interested with modifying the eating plan. We discussed higher protein food options to help her achieve her Category goals.  Subjective:   1. Vitamin D deficiency [name] is currently on prescription Vit D.   2. Pre-diabetes [name] is tolerating metformin well, and she denies GI upset.  3. Essential hypertension [name] is currently on metoprolol 25 mg BID and furosemide 20 mg 2 tablets as needed for edema.  4. At risk for deficient intake of food The patient is at a higher than average risk of deficient intake of food due to decreased appetite.  Assessment/Plan:   1. Vitamin D deficiency Low Vitamin D level contributes to fatigue and are associated with obesity, breast, and colon cancer. We will refill prescription Vitamin D for 1 month. Adeliz will follow-up for routine testing of Vitamin D, at least 2-3 times per year to avoid over-replacement.  - Vitamin D, Ergocalciferol, (DRISDOL) 1.25 MG (50000 UNIT) CAPS capsule; Take 1 capsule (50,000 Units total) by mouth every 7 (seven) days.  Dispense: 4 capsule; Refill: 0  2. Pre-diabetes [name] will continue to work on weight loss, exercise, and decreasing simple carbohydrates to help decrease the risk of diabetes. [name] agreed to continue metformin 500 mg BID, and continue with Category 3 meal  plan.  3. Essential hypertension [name] is working on healthy weight loss and exercise to improve blood pressure control. We will watch for signs of hypotension as she continues her lifestyle modifications. [name] agreed to continue beta blocker and as needed diuretic.  4. At risk for deficient intake of food [name] was given approximately 15 minutes of deficit intake of food prevention counseling today. [name] is at risk for eating too few calories based on current food recall. She was encouraged to focus on meeting caloric and protein goals according to her recommended meal plan.   5. Class 2 severe obesity with serious comorbidity and body mass index (BMI) of 36.0 to 36.9 in adult, unspecified obesity type (HCC) [name] is currently in the action stage of change. As such, her goal is to continue with weight loss efforts. She has agreed to the Category 3 Plan and keeping a food journal and adhering to recommended goals of 400-600 calories and 40+ grams of protein at supper daily.   Exercise goals: As is.  Behavioral modification strategies: increasing lean protein intake.  [name] has agreed to follow-up with our clinic in 3 weeks. She was informed of the importance of frequent follow-up visits to maximize her success with intensive lifestyle modifications for her multiple health conditions.   Objective:   Blood pressure 135/84, pulse 82, temperature 97.7 F (36.5 C), temperature source Oral, height 5\' 3"  (1.6 m), weight 204 lb (92.5 kg), SpO2 99 %. Body mass index is 36.14 kg/m.  General: Cooperative, alert, well developed, in no acute distress. HEENT: Conjunctivae and lids unremarkable. Cardiovascular:  Regular rhythm.  Lungs: Normal work of breathing. Neurologic: No focal deficits.   Lab Results  Component Value Date   CREATININE 0.62 11/27/2019   BUN 13 11/27/2019   NA 141 11/27/2019   K 4.4 11/27/2019   CL 106 11/27/2019   CO2 24 11/27/2019   Lab Results  Component  Value Date   ALT 7 11/27/2019   AST 9 11/27/2019   ALKPHOS 73 11/27/2019   BILITOT 0.3 11/27/2019   Lab Results  Component Value Date   HGBA1C 5.4 11/27/2019   HGBA1C 5.4 06/27/2019   HGBA1C 6.0 (H) 11/30/2018   Lab Results  Component Value Date   INSULIN 10.3 11/27/2019   INSULIN 12.5 06/27/2019   INSULIN 14.5 11/30/2018   Lab Results  Component Value Date   TSH 4.280 11/30/2018   Lab Results  Component Value Date   CHOL 201 (H) 11/27/2019   HDL 46 11/27/2019   LDLCALC 132 (H) 11/27/2019   TRIG 130 11/27/2019   CHOLHDL 4.4 11/30/2018   Lab Results  Component Value Date   WBC 9.8 11/30/2018   HGB 13.6 11/30/2018   HCT 39.1 11/30/2018   MCV 87 11/30/2018   PLT 303 11/30/2018   No results found for: IRON, TIBC, FERRITIN  Attestation Statements:   Reviewed by clinician on day of visit: allergies, medications, problem list, medical history, surgical history, family history, social history, and previous encounter notes.   I, Trixie Dredge, am acting as transcriptionist for Dennard Nip, MD.  I have reviewed the above documentation for accuracy and completeness, and I agree with the above. -  Dennard Nip, MD

## 2020-02-12 DIAGNOSIS — G4733 Obstructive sleep apnea (adult) (pediatric): Secondary | ICD-10-CM | POA: Diagnosis not present

## 2020-02-13 MED FILL — METHYLPREDNISOLONE 4 MG TAB: 4 | 6 days supply | Qty: 21 | Fill #0

## 2020-02-19 DIAGNOSIS — H903 Sensorineural hearing loss, bilateral: Secondary | ICD-10-CM | POA: Diagnosis not present

## 2020-02-21 ENCOUNTER — Ambulatory Visit (INDEPENDENT_AMBULATORY_CARE_PROVIDER_SITE_OTHER): Payer: 59 | Admitting: Family Medicine

## 2020-02-28 ENCOUNTER — Encounter: Payer: Self-pay | Admitting: Family Medicine

## 2020-02-28 ENCOUNTER — Encounter (INDEPENDENT_AMBULATORY_CARE_PROVIDER_SITE_OTHER): Payer: Self-pay | Admitting: Family Medicine

## 2020-02-29 ENCOUNTER — Other Ambulatory Visit: Payer: Self-pay | Admitting: Family Medicine

## 2020-02-29 ENCOUNTER — Encounter: Payer: Self-pay | Admitting: Family Medicine

## 2020-02-29 MED ORDER — FUROSEMIDE 20 MG PO TABS: 40.0000 mg | ORAL_TABLET | Freq: Every day | ORAL | 2 refills | Status: DC | PRN

## 2020-02-29 MED ORDER — AMPHETAMINE-DEXTROAMPHET ER 25 MG PO CP24: 25.0000 mg | ORAL_CAPSULE | ORAL | 0 refills | Status: DC

## 2020-02-29 MED FILL — ADDERALL XR 25 MG CAPSULE: 25 | 30 days supply | Qty: 30 | Fill #0

## 2020-02-29 MED FILL — FUROSEMIDE 20 MG TABS: 20 | 30 days supply | Qty: 60 | Fill #0

## 2020-02-29 MED FILL — VIT D2 1.25 MG (50,000 UNIT: 1.25 MG | 28 days supply | Qty: 4 | Fill #0

## 2020-02-29 NOTE — Telephone Encounter (Signed)
Ok to refill??  Last office visit 01/18/2020.  Last refill 01/09/2020.

## 2020-03-12 ENCOUNTER — Encounter (INDEPENDENT_AMBULATORY_CARE_PROVIDER_SITE_OTHER): Payer: Self-pay | Admitting: Family Medicine

## 2020-03-12 ENCOUNTER — Ambulatory Visit (INDEPENDENT_AMBULATORY_CARE_PROVIDER_SITE_OTHER): Payer: 59 | Admitting: Family Medicine

## 2020-03-12 ENCOUNTER — Other Ambulatory Visit: Payer: Self-pay

## 2020-03-12 VITALS — BP 110/75 | HR 75 | Temp 97.8°F | Ht 63.0 in | Wt 204.0 lb

## 2020-03-12 DIAGNOSIS — R7303 Prediabetes: Secondary | ICD-10-CM

## 2020-03-12 DIAGNOSIS — Z9189 Other specified personal risk factors, not elsewhere classified: Secondary | ICD-10-CM | POA: Diagnosis not present

## 2020-03-12 DIAGNOSIS — E559 Vitamin D deficiency, unspecified: Secondary | ICD-10-CM | POA: Diagnosis not present

## 2020-03-12 DIAGNOSIS — Z6836 Body mass index (BMI) 36.0-36.9, adult: Secondary | ICD-10-CM | POA: Diagnosis not present

## 2020-03-12 MED ORDER — METFORMIN HCL 500 MG PO TABS: 500.0000 mg | ORAL_TABLET | Freq: Two times a day (BID) | ORAL | 0 refills | Status: DC

## 2020-03-12 MED ORDER — VITAMIN D (ERGOCALCIFEROL) 1.25 MG (50000 UNIT) PO CAPS: 50000.0000 [IU] | ORAL_CAPSULE | ORAL | 0 refills | Status: DC

## 2020-03-12 MED FILL — metFORMIN HCL 500 MG TABS: 500 | 30 days supply | Qty: 60 | Fill #0

## 2020-03-13 NOTE — Progress Notes (Signed)
Chief Complaint:   OBESITY [name] is here to discuss her progress with her obesity treatment plan along with follow-up of her obesity related diagnoses. [name] is on the Category 3 Plan and keeping a food journal and adhering to recommended goals of 400-600 calories and 40+ grams of protein at supper daily and states she is following her eating plan approximately 80% of the time. [name] states she is walking 1-2 miles for 20-45 minutes 4 times per week.  Today's visit was #: 24 Starting weight: 235 lbs Starting date: 11/30/2018 Today's weight: 204 lbs Today's date: 03/12/2020 Total lbs lost to date: 31 Total lbs lost since last in-office visit: 0  Interim History: [name] has done well maintaining her weight in the last month. She is about 10 lbs away from her lowest weight right before the holidays and she is still down 31 lbs from when she started.  Subjective:   1. Pre-diabetes [name] is tolerating metformin well, and she denies nausea or vomiting. She is doing well with diet and exercise, and her A1c is improving.  2. Vitamin D deficiency [name] is on Vit D, but her level is not yet at goal. She denies nausea, vomiting, or muscle weakness.  3. At high risk for impaired function of liver [name] is at risk for impaired function of liver due to decreased protein.   Assessment/Plan:   1. Pre-diabetes [name] will continue to work on weight loss, exercise, and decreasing simple carbohydrates to help decrease the risk of diabetes. We will refill metformin for 1 month.  - metFORMIN (GLUCOPHAGE) 500 MG tablet; Take 1 tablet (500 mg total) by mouth 2 (two) times daily with a meal.  Dispense: 60 tablet; Refill: 0  2. Vitamin D deficiency Low Vitamin D level contributes to fatigue and are associated with obesity, breast, and colon cancer. We will refill prescription Vitamin D for 1 month. [name] will follow-up for routine testing of Vitamin D, at least 2-3 times per year to  avoid over-replacement.  - Vitamin D, Ergocalciferol, (DRISDOL) 1.25 MG (50000 UNIT) CAPS capsule; Take 1 capsule (50,000 Units total) by mouth every 7 (seven) days.  Dispense: 4 capsule; Refill: 0  3. At high risk for impaired function of liver [name] was given approximately 15 minutes of counseling today regarding prevention of impaired liver function. [name] was educated about her risk of developing NASH or even liver failure and advised that the only proven treatment for NAFLD was weight loss of at least 5-10% of body weight.   4. Class 2 severe obesity with serious comorbidity and body mass index (BMI) of 36.0 to 36.9 in adult, unspecified obesity type (HCC) [name] is currently in the action stage of change. As such, her goal is to continue with weight loss efforts. She has agreed to the Category 3 Plan and keeping a food journal and adhering to recommended goals of 400-600 calories and 40+ grams of protein at supper daily.   Eating Out handout was given today.  Exercise goals: As is.  Behavioral modification strategies: increasing lean protein intake and meal planning and cooking strategies.  [name] has agreed to follow-up with our clinic in 3 weeks. She was informed of the importance of frequent follow-up visits to maximize her success with intensive lifestyle modifications for her multiple health conditions.   Objective:   Blood pressure 110/75, pulse 75, temperature 97.8 F (36.6 C), temperature source Oral, height 5\' 3"  (1.6 m), weight 204 lb (92.5 kg), SpO2 98 %. Body  mass index is 36.14 kg/m.  General: Cooperative, alert, well developed, in no acute distress. HEENT: Conjunctivae and lids unremarkable. Cardiovascular: Regular rhythm.  Lungs: Normal work of breathing. Neurologic: No focal deficits.   Lab Results  Component Value Date   CREATININE 0.62 11/27/2019   BUN 13 11/27/2019   NA 141 11/27/2019   K 4.4 11/27/2019   CL 106 11/27/2019   CO2 24 11/27/2019    Lab Results  Component Value Date   ALT 7 11/27/2019   AST 9 11/27/2019   ALKPHOS 73 11/27/2019   BILITOT 0.3 11/27/2019   Lab Results  Component Value Date   HGBA1C 5.4 11/27/2019   HGBA1C 5.4 06/27/2019   HGBA1C 6.0 (H) 11/30/2018   Lab Results  Component Value Date   INSULIN 10.3 11/27/2019   INSULIN 12.5 06/27/2019   INSULIN 14.5 11/30/2018   Lab Results  Component Value Date   TSH 4.280 11/30/2018   Lab Results  Component Value Date   CHOL 201 (H) 11/27/2019   HDL 46 11/27/2019   LDLCALC 132 (H) 11/27/2019   TRIG 130 11/27/2019   CHOLHDL 4.4 11/30/2018   Lab Results  Component Value Date   WBC 9.8 11/30/2018   HGB 13.6 11/30/2018   HCT 39.1 11/30/2018   MCV 87 11/30/2018   PLT 303 11/30/2018   No results found for: IRON, TIBC, FERRITIN   Attestation Statements:   Reviewed by clinician on day of visit: allergies, medications, problem list, medical history, surgical history, family history, social history, and previous encounter notes.   I, Trixie Dredge, am acting as transcriptionist for Dennard Nip, MD.  I have reviewed the above documentation for accuracy and completeness, and I agree with the above. -  Dennard Nip, MD

## 2020-03-22 MED FILL — VIT D2 1.25 MG (50,000 UNIT: 1.25 MG | 28 days supply | Qty: 4 | Fill #0

## 2020-04-11 ENCOUNTER — Ambulatory Visit (INDEPENDENT_AMBULATORY_CARE_PROVIDER_SITE_OTHER): Payer: 59 | Admitting: Family Medicine

## 2020-04-25 ENCOUNTER — Other Ambulatory Visit: Payer: Self-pay | Admitting: Family Medicine

## 2020-04-25 ENCOUNTER — Other Ambulatory Visit (INDEPENDENT_AMBULATORY_CARE_PROVIDER_SITE_OTHER): Payer: Self-pay | Admitting: Family Medicine

## 2020-04-25 DIAGNOSIS — E559 Vitamin D deficiency, unspecified: Secondary | ICD-10-CM

## 2020-04-25 MED ORDER — AMPHETAMINE-DEXTROAMPHET ER 25 MG PO CP24: 25.0000 mg | ORAL_CAPSULE | ORAL | 0 refills | Status: DC

## 2020-04-25 NOTE — Telephone Encounter (Signed)
Ok to refill??  Last office visit 01/18/2020.  Last refill 02/29/2020.

## 2020-05-07 ENCOUNTER — Other Ambulatory Visit: Payer: Self-pay

## 2020-05-07 ENCOUNTER — Ambulatory Visit (INDEPENDENT_AMBULATORY_CARE_PROVIDER_SITE_OTHER): Payer: 59 | Admitting: Adult Health

## 2020-05-07 ENCOUNTER — Encounter (INDEPENDENT_AMBULATORY_CARE_PROVIDER_SITE_OTHER): Payer: Self-pay | Admitting: Adult Health

## 2020-05-07 VITALS — BP 128/80 | HR 80 | Temp 97.8°F | Ht 63.0 in | Wt 208.0 lb

## 2020-05-07 DIAGNOSIS — E559 Vitamin D deficiency, unspecified: Secondary | ICD-10-CM | POA: Diagnosis not present

## 2020-05-07 DIAGNOSIS — Z6836 Body mass index (BMI) 36.0-36.9, adult: Secondary | ICD-10-CM | POA: Diagnosis not present

## 2020-05-07 DIAGNOSIS — Z9189 Other specified personal risk factors, not elsewhere classified: Secondary | ICD-10-CM

## 2020-05-07 DIAGNOSIS — R7303 Prediabetes: Secondary | ICD-10-CM | POA: Diagnosis not present

## 2020-05-07 MED ORDER — METFORMIN HCL 500 MG PO TABS: 500.0000 mg | ORAL_TABLET | Freq: Two times a day (BID) | ORAL | 0 refills | Status: DC

## 2020-05-07 MED ORDER — VITAMIN D (ERGOCALCIFEROL) 1.25 MG (50000 UNIT) PO CAPS: 50000.0000 [IU] | ORAL_CAPSULE | ORAL | 0 refills | Status: DC

## 2020-05-07 MED FILL — VIT D2 1.25 MG (50,000 UNIT: 1.25 MG | 28 days supply | Qty: 4 | Fill #0

## 2020-05-07 MED FILL — METFORMIN HCL 500 MG TABS: 500 | 30 days supply | Qty: 60 | Fill #0

## 2020-05-08 NOTE — Progress Notes (Signed)
Chief Complaint:   OBESITY [name] is here to discuss her progress with her obesity treatment plan along with follow-up of her obesity related diagnoses. [name] is on the Category 3 Plan and states she is following her eating plan approximately 85-90% of the time. [name] states she is walking 2 miles 20-25 minutes 5 times per week.  Today's visit was #: 25 Starting weight: 235 lbs Starting date: 11/30/2018 Today's weight: 208 lbs Today's date: 05/07/2020 Total lbs lost to date: 27 Total lbs lost since last in-office visit: 0  Interim History: [name] reports increased levels of stress, i.e., recent death in family, marital struggles, grown children at home, and a demanding work schedule. She feels that breakfast and dinner are fairly simple to follow, but dinner is more of a challenge to stay on plan.  Subjective:   Vitamin D deficiency. [name] is on prescription Vitamin D supplementation. Last Vitamin D was 31.5 on 11/27/2019.  Prediabetes. [name] has a diagnosis of prediabetes based on her elevated HgA1c and was informed this puts her at greater risk of developing diabetes. She continues to work on diet and exercise to decrease her risk of diabetes. She denies nausea or hypoglycemia. [name] is on metformin 500 mg BID and tolerating this well. She will experience late afternoon polyphagia if she forgets her second dose.   Lab Results  Component Value Date   HGBA1C 5.4 11/27/2019   Lab Results  Component Value Date   INSULIN 10.3 11/27/2019   INSULIN 12.5 06/27/2019   INSULIN 14.5 11/30/2018   At risk for heart disease. [name] is at a higher than average risk for cardiovascular disease due to hypertension, mixed hyperlipidemia, and obesity.   Assessment/Plan:   Vitamin D deficiency. Low Vitamin D level contributes to fatigue and are associated with obesity, breast, and colon cancer. She was given a refill on her Vitamin D, Ergocalciferol, (DRISDOL) 1.25 MG  (50000 UNIT) CAPS capsule every week #4 with 0 refills and will follow-up for routine testing of Vitamin D at her next office visit.  Prediabetes. [name] will continue to work on weight loss, exercise, and decreasing simple carbohydrates to help decrease the risk of diabetes. Refill was given for metFORMIN (GLUCOPHAGE) 500 MG tablet BID #60 with 0 refills. Will have labs checked at her next office visit.   At risk for heart disease. [name] was given approximately 15 minutes of coronary artery disease prevention counseling today. She is 49 y.o. female and has risk factors for heart disease including obesity. We discussed intensive lifestyle modifications today with an emphasis on specific weight loss instructions and strategies.   Repetitive spaced learning was employed today to elicit superior memory formation and behavioral change.  Class 2 severe obesity with serious comorbidity and body mass index (BMI) of 36.0 to 36.9 in adult, unspecified obesity type ([place]).  [name] is currently in the action stage of change. As such, her goal is to continue with weight loss efforts. She has agreed to the Category 3 Plan and will journal 400-600 calories and 40+ grams of protein at supper.   Handout was provided on Recipe Guide.  Exercise goals: [name] will continue her current exercise regimen.   Behavioral modification strategies: increasing lean protein intake, meal planning and cooking strategies and planning for success.  [name] has agreed to follow-up with our clinic in 3-4 weeks. She was informed of the importance of frequent follow-up visits to maximize her success with intensive lifestyle modifications for her multiple health  conditions.   Objective:   Blood pressure 128/80, pulse 80, temperature 97.8 F (36.6 C), temperature source Oral, height 5\' 3"  (1.6 m), weight 208 lb (94.3 kg), SpO2 99 %. Body mass index is 36.85 kg/m.  General: Cooperative, alert, well developed, in no acute  distress. HEENT: Conjunctivae and lids unremarkable. Cardiovascular: Regular rhythm.  Lungs: Normal work of breathing. Neurologic: No focal deficits.   Lab Results  Component Value Date   CREATININE 0.62 11/27/2019   BUN 13 11/27/2019   NA 141 11/27/2019   K 4.4 11/27/2019   CL 106 11/27/2019   CO2 24 11/27/2019   Lab Results  Component Value Date   ALT 7 11/27/2019   AST 9 11/27/2019   ALKPHOS 73 11/27/2019   BILITOT 0.3 11/27/2019   Lab Results  Component Value Date   HGBA1C 5.4 11/27/2019   HGBA1C 5.4 06/27/2019   HGBA1C 6.0 (H) 11/30/2018   Lab Results  Component Value Date   INSULIN 10.3 11/27/2019   INSULIN 12.5 06/27/2019   INSULIN 14.5 11/30/2018   Lab Results  Component Value Date   TSH 4.280 11/30/2018   Lab Results  Component Value Date   CHOL 201 (H) 11/27/2019   HDL 46 11/27/2019   LDLCALC 132 (H) 11/27/2019   TRIG 130 11/27/2019   CHOLHDL 4.4 11/30/2018   Lab Results  Component Value Date   WBC 9.8 11/30/2018   HGB 13.6 11/30/2018   HCT 39.1 11/30/2018   MCV 87 11/30/2018   PLT 303 11/30/2018   No results found for: IRON, TIBC, FERRITIN  Attestation Statements:   Reviewed by clinician on day of visit: allergies, medications, problem list, medical history, surgical history, family history, social history, and previous encounter notes.  I, Michaelene Song, am acting as Location manager for [employer], NP-C   I have reviewed the above documentation for accuracy and completeness, and I agree with the above. -  Esaw Grandchild, NP

## 2020-05-14 MED FILL — METOPROLOL TARTRATE 25 MG T: 25 | 90 days supply | Qty: 180 | Fill #1

## 2020-05-21 DIAGNOSIS — N390 Urinary tract infection, site not specified: Secondary | ICD-10-CM | POA: Diagnosis not present

## 2020-05-21 DIAGNOSIS — N760 Acute vaginitis: Secondary | ICD-10-CM | POA: Diagnosis not present

## 2020-05-21 DIAGNOSIS — Z90710 Acquired absence of both cervix and uterus: Secondary | ICD-10-CM | POA: Insufficient documentation

## 2020-05-21 DIAGNOSIS — N959 Unspecified menopausal and perimenopausal disorder: Secondary | ICD-10-CM | POA: Diagnosis not present

## 2020-05-21 DIAGNOSIS — N951 Menopausal and female climacteric states: Secondary | ICD-10-CM | POA: Diagnosis not present

## 2020-05-21 DIAGNOSIS — J45909 Unspecified asthma, uncomplicated: Secondary | ICD-10-CM | POA: Insufficient documentation

## 2020-05-22 MED FILL — metroNIDAZOLE 500 MG TABS: 500 | 7 days supply | Qty: 14 | Fill #0

## 2020-05-29 ENCOUNTER — Other Ambulatory Visit: Payer: Self-pay

## 2020-05-29 ENCOUNTER — Ambulatory Visit (INDEPENDENT_AMBULATORY_CARE_PROVIDER_SITE_OTHER): Payer: 59 | Admitting: Adult Health

## 2020-05-29 ENCOUNTER — Encounter (INDEPENDENT_AMBULATORY_CARE_PROVIDER_SITE_OTHER): Payer: Self-pay | Admitting: Adult Health

## 2020-05-29 VITALS — BP 135/78 | HR 76 | Temp 98.1°F | Ht 63.0 in | Wt 206.0 lb

## 2020-05-29 DIAGNOSIS — E559 Vitamin D deficiency, unspecified: Secondary | ICD-10-CM | POA: Diagnosis not present

## 2020-05-29 DIAGNOSIS — F909 Attention-deficit hyperactivity disorder, unspecified type: Secondary | ICD-10-CM | POA: Diagnosis not present

## 2020-05-29 DIAGNOSIS — R7303 Prediabetes: Secondary | ICD-10-CM

## 2020-05-29 DIAGNOSIS — Z9189 Other specified personal risk factors, not elsewhere classified: Secondary | ICD-10-CM | POA: Diagnosis not present

## 2020-05-29 DIAGNOSIS — Z6836 Body mass index (BMI) 36.0-36.9, adult: Secondary | ICD-10-CM

## 2020-05-29 MED ORDER — VITAMIN D (ERGOCALCIFEROL) 1.25 MG (50000 UNIT) PO CAPS: 50000.0000 [IU] | ORAL_CAPSULE | ORAL | 0 refills | Status: DC

## 2020-05-29 MED FILL — VIT D2 1.25 MG (50,000 UNIT: 1.25 MG | 28 days supply | Qty: 4 | Fill #0

## 2020-05-29 NOTE — Progress Notes (Signed)
Chief Complaint:   OBESITY [name] is here to discuss her progress with her obesity treatment plan along with follow-up of her obesity related diagnoses. [name] is on the Category 3 Plan and states she is following her eating plan approximately 85-90% of the time. [name] states she is walking 20-25 minutes 5 times per week.  Today's visit was #: 50 Starting weight: 235 lbs Starting date: 11/30/2018 Today's weight: 206 lbs Today's date: 05/29/2020 Total lbs lost to date: 29 Total lbs lost since last in-office visit: 2  Interim History: [name] denies polyphagia or excessive cravings on the Category 3 meal plan, however, is starting to bore with food options on the plan. She continues to drink an excellent amount of water a day, usually at least 100 oz a day. Her ultimate goal is to lose down to 170-175 lbs.  Subjective:   Vitamin D deficiency. [name] is on prescription strength Vitamin D supplementation and denies nausea, vomiting, or muscle weakness.    Ref. Range 11/27/2019 12:21  Vitamin D, 25-Hydroxy Latest Ref Range: 30.0 - 100.0 ng/mL 31.5   Prediabetes. [name] has a diagnosis of prediabetes based on her elevated HgA1c and was informed this puts her at greater risk of developing diabetes. She continues to work on diet and exercise to decrease her risk of diabetes. She denies nausea or hypoglycemia. [name] is on metformin 500 mg BID and denies polyphagia.  Lab Results  Component Value Date   HGBA1C 5.4 11/27/2019   Lab Results  Component Value Date   INSULIN 10.3 11/27/2019   INSULIN 12.5 06/27/2019   INSULIN 14.5 11/30/2018   Attention deficit hyperactivity disorder (ADHD), unspecified ADHD type. Bena's PCP is considering changing her from Adderall to Vyvanse, however she denies binge eatingPDMP reviewed and Adderall XR 25 mg is getting refilled at appropriate intervals. She had been on various dosages of Adderall for years and feels that her symptoms  are well controlled.  At risk for osteoporosis. [name] is at higher risk of osteopenia and osteoporosis due to Vitamin D deficiency and obesity.   Assessment/Plan:   Vitamin D deficiency. Low Vitamin D level contributes to fatigue and are associated with obesity, breast, and colon cancer. She was given a refill on her Vitamin D, Ergocalciferol, (DRISDOL) 1.25 MG (50000 UNIT) CAPS capsule every week #4 with 0 refills and VITAMIN D 25 Hydroxy (Vit-D Deficiency, Fractures) level will be checked today.  Prediabetes. [name] will continue to work on weight loss, exercise, and decreasing simple carbohydrates to help decrease the risk of diabetes. She will continue her current metformin regimen (no medication refill today). Comprehensive metabolic panel, Insulin, random, Hemoglobin A1c, Lipid Panel With LDL/HDL Ratio will be checked today.  Attention deficit hyperactivity disorder (ADHD), unspecified ADHD type. [name] will continue her current prescription medication regimen. She will discuss ADD treatment with her PCP.  At risk for osteoporosis. [name] was given approximately 15 minutes of osteoporosis prevention counseling today. [name] is at risk for osteopenia and osteoporosis due to her Vitamin D deficiency. She was encouraged to take her Vitamin D and follow her higher calcium diet and increase strengthening exercise to help strengthen her bones and decrease her risk of osteopenia and osteoporosis.  Repetitive spaced learning was employed today to elicit superior memory formation and behavioral change.  Class 2 severe obesity with serious comorbidity and body mass index (BMI) of 36.0 to 36.9 in adult, unspecified obesity type ([place]).  [name] is currently in the action stage of change.  As such, her goal is to continue with weight loss efforts. She has agreed to keeping a food journal and adhering to recommended goals of 1500-1600 calories and 100+ grams of protein daily.   Handouts were  provided on Recipe Guide and Eating Out Guide.  Exercise goals: [name] will continue her current exercise regimen.   Behavioral modification strategies: increasing lean protein intake, meal planning and cooking strategies, planning for success and keeping a strict food journal.  [name] has agreed to follow-up with our clinic in 2-3 weeks. She was informed of the importance of frequent follow-up visits to maximize her success with intensive lifestyle modifications for her multiple health conditions.   [name] was informed we would discuss her lab results at her next visit unless there is a critical issue that needs to be addressed sooner. [name] agreed to keep her next visit at the agreed upon time to discuss these results.  Objective:   Blood pressure 135/78, pulse 76, temperature 98.1 F (36.7 C), temperature source Oral, height 5\' 3"  (1.6 m), weight 206 lb (93.4 kg), SpO2 97 %. Body mass index is 36.49 kg/m.  General: Cooperative, alert, well developed, in no acute distress. HEENT: Conjunctivae and lids unremarkable. Cardiovascular: Regular rhythm.  Lungs: Normal work of breathing. Neurologic: No focal deficits.   Lab Results  Component Value Date   CREATININE 0.62 11/27/2019   BUN 13 11/27/2019   NA 141 11/27/2019   K 4.4 11/27/2019   CL 106 11/27/2019   CO2 24 11/27/2019   Lab Results  Component Value Date   ALT 7 11/27/2019   AST 9 11/27/2019   ALKPHOS 73 11/27/2019   BILITOT 0.3 11/27/2019   Lab Results  Component Value Date   HGBA1C 5.4 11/27/2019   HGBA1C 5.4 06/27/2019   HGBA1C 6.0 (H) 11/30/2018   Lab Results  Component Value Date   INSULIN 10.3 11/27/2019   INSULIN 12.5 06/27/2019   INSULIN 14.5 11/30/2018   Lab Results  Component Value Date   TSH 4.280 11/30/2018   Lab Results  Component Value Date   CHOL 201 (H) 11/27/2019   HDL 46 11/27/2019   LDLCALC 132 (H) 11/27/2019   TRIG 130 11/27/2019   CHOLHDL 4.4 11/30/2018   Lab Results    Component Value Date   WBC 9.8 11/30/2018   HGB 13.6 11/30/2018   HCT 39.1 11/30/2018   MCV 87 11/30/2018   PLT 303 11/30/2018   No results found for: IRON, TIBC, FERRITIN  Attestation Statements:   Reviewed by clinician on day of visit: allergies, medications, problem list, medical history, surgical history, family history, social history, and previous encounter notes.  I, Michaelene Song, am acting as Location manager for [employer], NP-C   I have reviewed the above documentation for accuracy and completeness, and I agree with the above. -  Esaw Grandchild, NP

## 2020-05-30 LAB — COMPREHENSIVE METABOLIC PANEL
ALT: 11 IU/L (ref 0–32)
AST: 13 IU/L (ref 0–40)
Albumin/Globulin Ratio: 1.4 (ref 1.2–2.2)
Albumin: 4.3 g/dL (ref 3.8–4.8)
Alkaline Phosphatase: 73 IU/L (ref 48–121)
BUN/Creatinine Ratio: 27 — ABNORMAL HIGH (ref 9–23)
BUN: 21 mg/dL (ref 6–24)
Bilirubin Total: 0.3 mg/dL (ref 0.0–1.2)
CO2: 22 mmol/L (ref 20–29)
Calcium: 10 mg/dL (ref 8.7–10.2)
Chloride: 101 mmol/L (ref 96–106)
Creatinine, Ser: 0.78 mg/dL (ref 0.57–1.00)
GFR calc Af Amer: 105 mL/min/{1.73_m2} (ref >59)
GFR calc non Af Amer: 91 mL/min/{1.73_m2} (ref >59)
Globulin, Total: 3.1 g/dL (ref 1.5–4.5)
Glucose: 81 mg/dL (ref 65–99)
Potassium: 4.4 mmol/L (ref 3.5–5.2)
Sodium: 141 mmol/L (ref 134–144)
Total Protein: 7.4 g/dL (ref 6.0–8.5)

## 2020-05-30 LAB — HEMOGLOBIN A1C
Est. average glucose Bld gHb Est-mCnc: 114 mg/dL
Hgb A1c MFr Bld: 5.6 % (ref 4.8–5.6)

## 2020-05-30 LAB — LIPID PANEL WITH LDL/HDL RATIO
Cholesterol, Total: 261 mg/dL — ABNORMAL HIGH (ref 100–199)
HDL: 49 mg/dL (ref >39)
LDL Chol Calc (NIH): 175 mg/dL — ABNORMAL HIGH (ref 0–99)
LDL/HDL Ratio: 3.6 ratio — ABNORMAL HIGH (ref 0.0–3.2)
Triglycerides: 197 mg/dL — ABNORMAL HIGH (ref 0–149)
VLDL Cholesterol Cal: 37 mg/dL (ref 5–40)

## 2020-05-30 LAB — INSULIN, RANDOM: INSULIN: 15.2 u[IU]/mL (ref 2.6–24.9)

## 2020-05-30 LAB — VITAMIN D 25 HYDROXY (VIT D DEFICIENCY, FRACTURES): Vit D, 25-Hydroxy: 37.5 ng/mL (ref 30.0–100.0)

## 2020-06-12 ENCOUNTER — Other Ambulatory Visit: Payer: Self-pay | Admitting: Family Medicine

## 2020-06-12 NOTE — Telephone Encounter (Signed)
Ok to refill??  Last office visit 01/18/2020.  Last refill 04/25/2020.

## 2020-06-13 MED ORDER — AMPHETAMINE-DEXTROAMPHET ER 25 MG PO CP24: 25.0000 mg | ORAL_CAPSULE | ORAL | 0 refills | Status: DC

## 2020-06-13 MED FILL — ADDERALL XR 25 MG CAPSULE: 25 | 30 days supply | Qty: 30 | Fill #0

## 2020-06-19 ENCOUNTER — Ambulatory Visit (INDEPENDENT_AMBULATORY_CARE_PROVIDER_SITE_OTHER): Payer: 59 | Admitting: Family Medicine

## 2020-06-19 ENCOUNTER — Other Ambulatory Visit: Payer: Self-pay

## 2020-06-19 ENCOUNTER — Encounter (INDEPENDENT_AMBULATORY_CARE_PROVIDER_SITE_OTHER): Payer: Self-pay | Admitting: Family Medicine

## 2020-06-19 VITALS — BP 123/79 | HR 91 | Temp 98.0°F | Ht 63.0 in | Wt 209.0 lb

## 2020-06-19 DIAGNOSIS — Z6837 Body mass index (BMI) 37.0-37.9, adult: Secondary | ICD-10-CM

## 2020-06-19 DIAGNOSIS — E66812 Obesity, class 2: Secondary | ICD-10-CM

## 2020-06-19 DIAGNOSIS — F3289 Other specified depressive episodes: Secondary | ICD-10-CM | POA: Diagnosis not present

## 2020-06-19 DIAGNOSIS — R7303 Prediabetes: Secondary | ICD-10-CM | POA: Diagnosis not present

## 2020-06-19 DIAGNOSIS — E559 Vitamin D deficiency, unspecified: Secondary | ICD-10-CM

## 2020-06-19 DIAGNOSIS — Z9189 Other specified personal risk factors, not elsewhere classified: Secondary | ICD-10-CM | POA: Diagnosis not present

## 2020-06-19 MED ORDER — TOPIRAMATE 50 MG PO TABS: 50.0000 mg | ORAL_TABLET | Freq: Every day | ORAL | 0 refills | Status: DC

## 2020-06-19 MED ORDER — METFORMIN HCL 500 MG PO TABS: 500.0000 mg | ORAL_TABLET | Freq: Two times a day (BID) | ORAL | 0 refills | Status: DC

## 2020-06-19 MED FILL — METFORMIN HCL 500 MG TABS: 500 | 30 days supply | Qty: 60 | Fill #0

## 2020-06-19 MED FILL — TOPIRAMATE 50 MG TABLET: 50 | 30 days supply | Qty: 30 | Fill #0

## 2020-06-20 NOTE — Progress Notes (Signed)
Chief Complaint:   OBESITY [name] is here to discuss her progress with her obesity treatment plan along with follow-up of her obesity related diagnoses. [name] is on keeping a food journal and adhering to recommended goals of 1500-1600 calories and 100+ grams of protein daily and states she is following her eating plan approximately 85% of the time. [name] states she is walking for 25 minutes 4-5 times per week.  Today's visit was #: 71 Starting weight: 235 lbs Starting date: 11/30/2018 Today's weight: 209 lbs Today's date: 06/19/2020 Total lbs lost to date: 26 Total lbs lost since last in-office visit: 0  Interim History: [name] is retaining some fluid today. She has done well following her Category 3 plan and journaling on and off. She notes increased stress at work and this can make meal planning and avoiding comfort eating difficult.  Subjective:   1. Pre-diabetes [name] is stable on metformin, and she denies nausea or vomiting. She is doing well on her diet prescription.  2. Vitamin D deficiency [name] is stable on Vit D, and she denies nausea or vomiting.  3. Other depression, with emotional eating [name] is stable on Topamax, and she is working in Biomedical engineer eating. She requests a refill today.  4. At risk for dehydration [name] is at risk for dehydration due to inadequate water intake.  Assessment/Plan:   1. Pre-diabetes [name] will continue to work on weight loss, exercise, and decreasing simple carbohydrates to help decrease the risk of diabetes. We will refill metformin for 1 month.  - metFORMIN (GLUCOPHAGE) 500 MG tablet; Take 1 tablet (500 mg total) by mouth 2 (two) times daily with a meal.  Dispense: 60 tablet; Refill: 0  2. Vitamin D deficiency Low Vitamin D level contributes to fatigue and are associated with obesity, breast, and colon cancer. We will refill prescription Vitamin D 50,000 IU every week for 1 month. [name] will follow-up for  routine testing of Vitamin D, at least 2-3 times per year to avoid over-replacement.  3. Other depression, with emotional eating Behavior modification techniques were discussed today to help [name] deal with her emotional/non-hunger eating behaviors. We will refill Topamax for 1 month. Orders and follow up as documented in patient record.   - topiramate (TOPAMAX) 50 MG tablet; Take 1 tablet (50 mg total) by mouth at bedtime.  Dispense: 30 tablet; Refill: 0  4. At risk for dehydration [name] was given approximately 15 minutes dehydration prevention counseling today. [name] is at risk for dehydration due to weight loss and current medication(s). She was encouraged to hydrate and monitor fluid status to avoid dehydration as well as weight loss plateaus.   5. Class 2 severe obesity with serious comorbidity and body mass index (BMI) of 37.0 to 37.9 in adult, unspecified obesity type ([facility]) [name] is currently in the action stage of change. As such, her goal is to continue with weight loss efforts. She has agreed to keeping a food journal and adhering to recommended goals of 1500-1600 calories and 100+ grams of protein daily.   Exercise goals: As is.  Behavioral modification strategies: increasing lean protein intake, increasing water intake, meal planning and cooking strategies and emotional eating strategies.  [name] has agreed to follow-up with our clinic in 3 to 4 weeks. She was informed of the importance of frequent follow-up visits to maximize her success with intensive lifestyle modifications for her multiple health conditions.   Objective:   Blood pressure 123/79, pulse 91, temperature 98 F (36.7 C),  temperature source Oral, height 5\' 3"  (1.6 m), weight 209 lb (94.8 kg), SpO2 98 %. Body mass index is 37.02 kg/m.  General: Cooperative, alert, well developed, in no acute distress. HEENT: Conjunctivae and lids unremarkable. Cardiovascular: Regular rhythm.  Lungs: Normal work of  breathing. Neurologic: No focal deficits.   Lab Results  Component Value Date   CREATININE 0.78 05/29/2020   BUN 21 05/29/2020   NA 141 05/29/2020   K 4.4 05/29/2020   CL 101 05/29/2020   CO2 22 05/29/2020   Lab Results  Component Value Date   ALT 11 05/29/2020   AST 13 05/29/2020   ALKPHOS 73 05/29/2020   BILITOT 0.3 05/29/2020   Lab Results  Component Value Date   HGBA1C 5.6 05/29/2020   HGBA1C 5.4 11/27/2019   HGBA1C 5.4 06/27/2019   HGBA1C 6.0 (H) 11/30/2018   Lab Results  Component Value Date   INSULIN 15.2 05/29/2020   INSULIN 10.3 11/27/2019   INSULIN 12.5 06/27/2019   INSULIN 14.5 11/30/2018   Lab Results  Component Value Date   TSH 4.280 11/30/2018   Lab Results  Component Value Date   CHOL 261 (H) 05/29/2020   HDL 49 05/29/2020   LDLCALC 175 (H) 05/29/2020   TRIG 197 (H) 05/29/2020   CHOLHDL 4.4 11/30/2018   Lab Results  Component Value Date   WBC 9.8 11/30/2018   HGB 13.6 11/30/2018   HCT 39.1 11/30/2018   MCV 87 11/30/2018   PLT 303 11/30/2018   No results found for: IRON, TIBC, FERRITIN  Attestation Statements:   Reviewed by clinician on day of visit: allergies, medications, problem list, medical history, surgical history, family history, social history, and previous encounter notes.   I, Trixie Dredge, am acting as transcriptionist for Dennard Nip, MD.  I have reviewed the above documentation for accuracy and completeness, and I agree with the above. -  Dennard Nip, MD

## 2020-06-25 MED ORDER — VITAMIN D (ERGOCALCIFEROL) 1.25 MG (50000 UNIT) PO CAPS: 50000.0000 [IU] | ORAL_CAPSULE | ORAL | 0 refills | Status: DC

## 2020-06-25 MED FILL — VIT D2 1.25 MG (50,000 UNIT: 1.25 MG | 28 days supply | Qty: 4 | Fill #0

## 2020-07-02 ENCOUNTER — Ambulatory Visit: Payer: 59 | Admitting: Family Medicine

## 2020-07-02 ENCOUNTER — Other Ambulatory Visit: Payer: Self-pay

## 2020-07-02 VITALS — BP 110/70 | HR 85 | Temp 97.6°F | Ht 63.0 in | Wt 209.0 lb

## 2020-07-02 DIAGNOSIS — R0600 Dyspnea, unspecified: Secondary | ICD-10-CM | POA: Diagnosis not present

## 2020-07-02 MED ORDER — PREDNISONE 20 MG PO TABS: ORAL_TABLET | ORAL | 0 refills | Status: DC

## 2020-07-02 MED FILL — predniSONE 20 MG TABS: 20 | 6 days supply | Qty: 12 | Fill #0

## 2020-07-02 NOTE — Progress Notes (Signed)
Subjective:    Patient ID: [name], female    DOB: 1971/05/05, 49 y.o.   MRN: [id number]  Patient has a history of asthma.  She works in Corporate treasurer.  Recently a doctor at her office has tested positive for Covid although she had no direct contact with him.  Beginning Sunday she developed shortness of breath.  She reports wheezing and a dry nonproductive cough.  She has been using albuterol at home and has been seeing some benefit however the frequency of the albuterol use was increasing and therefore she presented today for a "breathing treatment".  She denies any fevers or chills.  She denies any rhinorrhea.  She denies any sore throat.  She denies any change in her sense of taste.  She denies any fevers or chills.  Instead she reports a dry nonproductive cough, tightness in her chest, and some wheezing.  On examination however, oxygen is 98% on room air.  She is not tachypneic although she does appear slightly short of breath with conversation.  She does have diminished breath sounds in both lungs but no audible wheezing.  There are no crackles or rails. Past Medical History:  Diagnosis Date  . Anxiety   . Asthma   . Back pain   . Chest pain   . Chronic CHF ([place])   . Dyspnea   . Echocardiogram 10/25/2015   EF 60-65, normal wall motion, grade 1 diastolic dysfunction, GLS -19.1%  . Edema of left lower extremity   . Fracture dislocation of elbow joint 01/15/2013   right  . GERD (gastroesophageal reflux disease)    Omeprazole as needed, none in 6 mos.  . Hyperlipidemia   . Hypertension   . Joint pain   . Obesity   . Palpitations   . Prediabetes   . R/L Cardiac catheterization 08/18/2018   Normal coronary arteries, mean RA 6, mean PA 17, PCWP 10  . Seborrheic dermatitis   . Sleep apnea    uses CPAP "most nights"   Past Surgical History:  Procedure Laterality Date  . [place]; 10/10/2000   x 2  . KNEE ARTHROSCOPY Left 1991   also in 2019  . LAPAROSCOPIC  ASSISTED VAGINAL HYSTERECTOMY  11/02/2006  . LAPAROSCOPIC LYSIS OF ADHESIONS  03/06/2005  . RADIAL HEAD ARTHROPLASTY Right 01/17/2013   Procedure: RIGHT RADIAL HEAD ARTHROPLASTY OPEN REDUCTION INTERNAL FIXATION CORONOID ;  Surgeon: Tennis Must, MD;  Location: [place];  Service: Orthopedics;  Laterality: Right;  . RIGHT/LEFT HEART CATH AND CORONARY ANGIOGRAPHY N/A 08/18/2018   Procedure: RIGHT/LEFT HEART CATH AND CORONARY ANGIOGRAPHY;  Surgeon: Nelva Bush, MD;  Location: [facility];  Service: Cardiovascular;  Laterality: N/A;  . TUBAL LIGATION  03/06/2005   Current Outpatient Medications on File Prior to Visit  Medication Sig Dispense Refill  . albuterol (PROVENTIL HFA;VENTOLIN HFA) 108 (90 Base) MCG/ACT inhaler Inhale 2 puffs into the lungs every 6 (six) hours as needed for wheezing. 1 Inhaler 11  . ALPRAZolam (XANAX) 0.5 MG tablet Take 1 tablet (0.5 mg total) by mouth 3 (three) times daily as needed. 45 tablet 0  . amphetamine-dextroamphetamine (ADDERALL XR) 25 MG 24 hr capsule Take 1 capsule by mouth every morning. 30 capsule 0  . furosemide (LASIX) 20 MG tablet Take 2 tablets (40 mg total) by mouth daily as needed (swelling). 60 tablet 2  . ibuprofen (ADVIL,MOTRIN) 200 MG tablet Take 400 mg by mouth 2 (two) times daily as needed  for headache or moderate pain.    . metFORMIN (GLUCOPHAGE) 500 MG tablet Take 1 tablet (500 mg total) by mouth 2 (two) times daily with a meal. 60 tablet 0  . metoprolol tartrate (LOPRESSOR) 25 MG tablet Take 1 tablet (25 mg total) by mouth 2 (two) times daily. 180 tablet 3  . nitroGLYCERIN (NITROSTAT) 0.4 MG SL tablet Place 1 tablet (0.4 mg total) under the tongue every 5 (five) minutes as needed for chest pain. 25 tablet 3  . topiramate (TOPAMAX) 50 MG tablet Take 1 tablet (50 mg total) by mouth at bedtime. 30 tablet 0  . Vitamin D, Ergocalciferol, (DRISDOL) 1.25 MG (50000 UNIT) CAPS capsule Take 1 capsule (50,000 Units total) by mouth  every 7 (seven) days. 4 capsule 0   No current facility-administered medications on file prior to visit.   Allergies  Allergen Reactions  . Adhesive [Tape] Rash  . Latex Rash   Social History   Socioeconomic History  . Marital status: Married    Spouse name: Grethel Zenk  . Number of children: 2  . Years of education: Not on file  . Highest education level: Not on file  Occupational History  . Occupation: CMA    Employer: [employer]  Tobacco Use  . Smoking status: Never Smoker  . Smokeless tobacco: Never Used  Substance and Sexual Activity  . Alcohol use: No  . Drug use: No  . Sexual activity: Not on file  Other Topics Concern  . Not on file  Social History Narrative  . Not on file   Social Determinants of Health   Financial Resource Strain:   . Difficulty of Paying Living Expenses:   Food Insecurity:   . Worried About Charity fundraiser in the Last Year:   . Arboriculturist in the Last Year:   Transportation Needs:   . Film/video editor (Medical):   Marland Kitchen Lack of Transportation (Non-Medical):   Physical Activity:   . Days of Exercise per Week:   . Minutes of Exercise per Session:   Stress:   . Feeling of Stress :   Social Connections:   . Frequency of Communication with Friends and Family:   . Frequency of Social Gatherings with Friends and Family:   . Attends Religious Services:   . Active Member of Clubs or Organizations:   . Attends Archivist Meetings:   Marland Kitchen Marital Status:   Intimate Partner Violence:   . Fear of Current or Ex-Partner:   . Emotionally Abused:   Marland Kitchen Physically Abused:   . Sexually Abused:      Review of Systems  All other systems reviewed and are negative.      Objective:   Physical Exam Vitals reviewed.  Constitutional:      Appearance: She is obese.  HENT:     Right Ear: Tympanic membrane and ear canal normal.     Left Ear: Tympanic membrane and ear canal normal.     Nose: Nose normal.  Cardiovascular:      Rate and Rhythm: Normal rate and regular rhythm.     Pulses: Normal pulses.     Heart sounds: Normal heart sounds. No murmur heard.  No friction rub. No gallop.   Pulmonary:     Effort: No accessory muscle usage, prolonged expiration or respiratory distress.     Breath sounds: Decreased air movement present. No stridor. Decreased breath sounds present. No wheezing, rhonchi or rales.  Chest:  Chest wall: No tenderness.  Abdominal:     General: Bowel sounds are normal. There is no distension.     Palpations: Abdomen is soft.     Tenderness: There is no abdominal tenderness. There is no right CVA tenderness, left CVA tenderness, guarding or rebound.  Musculoskeletal:     Right lower leg: No edema.     Left lower leg: No edema.  Neurological:     Mental Status: She is alert.         Assessment & Plan:  Dyspnea, unspecified type - Plan: SARS-COV-2 RNA,(COVID-19) QUAL NAAT  Patient symptoms sound consistent with a mild asthma exacerbation.  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  She is only had 1 dose of the Covid vaccination and therefore she is not fully immune.  Patient was given a DuoNeb today in the office around 1120.  I will recheck the patient in 10 minutes to reassess to see if her breathing is improving.  If so I plan to put the patient on a short steroid taper and to recommend albuterol 2 puffs every 4 hours as needed  Patient feels that her breathing is not as labored at 1130.  Therefore we will discharge the patient with a prednisone taper pack and recommend that she use albuterol 2 puffs inhaled every 4-6 hours as needed.  She should quarantine at home until we have the results of the Covid test.

## 2020-07-03 LAB — SARS-COV-2 RNA,(COVID-19) QUALITATIVE NAAT: SARS CoV2 RNA: NOT DETECTED

## 2020-07-15 ENCOUNTER — Other Ambulatory Visit: Payer: Self-pay

## 2020-07-15 ENCOUNTER — Ambulatory Visit (INDEPENDENT_AMBULATORY_CARE_PROVIDER_SITE_OTHER): Payer: 59 | Admitting: Family Medicine

## 2020-07-15 ENCOUNTER — Encounter (INDEPENDENT_AMBULATORY_CARE_PROVIDER_SITE_OTHER): Payer: Self-pay | Admitting: Family Medicine

## 2020-07-15 VITALS — BP 119/75 | HR 78 | Temp 97.9°F | Ht 63.0 in | Wt 209.0 lb

## 2020-07-15 DIAGNOSIS — Z6837 Body mass index (BMI) 37.0-37.9, adult: Secondary | ICD-10-CM

## 2020-07-15 DIAGNOSIS — Z9189 Other specified personal risk factors, not elsewhere classified: Secondary | ICD-10-CM | POA: Diagnosis not present

## 2020-07-15 DIAGNOSIS — E559 Vitamin D deficiency, unspecified: Secondary | ICD-10-CM

## 2020-07-15 MED ORDER — VITAMIN D (ERGOCALCIFEROL) 1.25 MG (50000 UNIT) PO CAPS: 50000.0000 [IU] | ORAL_CAPSULE | ORAL | 0 refills | Status: DC

## 2020-07-16 NOTE — Progress Notes (Signed)
Chief Complaint:   OBESITY [name] is here to discuss her progress with her obesity treatment plan along with follow-up of her obesity related diagnoses. [name] is on the Category 3 Plan and states she is following her eating plan approximately 90% of the time. [name] states she is doing 0 minutes 0 times per week.  Today's visit was #: 28 Starting weight: 235 lbs Starting date: 11/30/2018 Today's weight: 209 lbs Today's date: 07/15/2020 Total lbs lost to date: 26 Total lbs lost since last in-office visit: 0  Interim History: [name] has done well maintaining her weight this month. She is doing Category 3 for breakfast and lunch, but doing more portion control and smarter choices for dinner. She would like to eat dinner similar to her family.  Subjective:   1. Vitamin D deficiency [name]'s Vit D level is not at goal, and she denies nausea or vomiting.  2. At risk for impaired metabolic function [name] is at increased risk for impaired metabolic function due to likely decrease in protein.  Assessment/Plan:   1. Vitamin D deficiency Low Vitamin D level contributes to fatigue and are associated with obesity, breast, and colon cancer. We will refill prescription Vitamin D for 1 month. [name] will follow-up for routine testing of Vitamin D, at least 2-3 times per year to avoid over-replacement. We will recheck labs in 6 weeks.  - Vitamin D, Ergocalciferol, (DRISDOL) 1.25 MG (50000 UNIT) CAPS capsule; Take 1 capsule (50,000 Units total) by mouth every 7 (seven) days.  Dispense: 4 capsule; Refill: 0  2. At risk for impaired metabolic function [name] was given approximately 15 minutes of impaired  metabolic function prevention counseling today. We discussed intensive lifestyle modifications today with an emphasis on specific nutrition and exercise instructions and strategies.   Repetitive spaced learning was employed today to elicit superior memory formation and behavioral  change.  3. Class 2 severe obesity with serious comorbidity and body mass index (BMI) of 37.0 to 37.9 in adult, unspecified obesity type (HCC) [name] is currently in the action stage of change. As such, her goal is to continue with weight loss efforts. She has agreed to the Category 3 Plan and keeping a food journal and adhering to recommended goals of 400-600 calories and 40+ grams of protein at supper daily.   Behavioral modification strategies: increasing lean protein intake and meal planning and cooking strategies.  [name] has agreed to follow-up with our clinic in 3 to 4 weeks. She was informed of the importance of frequent follow-up visits to maximize her success with intensive lifestyle modifications for her multiple health conditions.   Objective:   Blood pressure 119/75, pulse 78, temperature 97.9 F (36.6 C), height 5\' 3"  (1.6 m), weight 209 lb (94.8 kg), SpO2 97 %. Body mass index is 37.02 kg/m.  General: Cooperative, alert, well developed, in no acute distress. HEENT: Conjunctivae and lids unremarkable. Cardiovascular: Regular rhythm.  Lungs: Normal work of breathing. Neurologic: No focal deficits.   Lab Results  Component Value Date   CREATININE 0.78 05/29/2020   BUN 21 05/29/2020   NA 141 05/29/2020   K 4.4 05/29/2020   CL 101 05/29/2020   CO2 22 05/29/2020   Lab Results  Component Value Date   ALT 11 05/29/2020   AST 13 05/29/2020   ALKPHOS 73 05/29/2020   BILITOT 0.3 05/29/2020   Lab Results  Component Value Date   HGBA1C 5.6 05/29/2020   HGBA1C 5.4 11/27/2019   HGBA1C 5.4 06/27/2019  HGBA1C 6.0 (H) 11/30/2018   Lab Results  Component Value Date   INSULIN 15.2 05/29/2020   INSULIN 10.3 11/27/2019   INSULIN 12.5 06/27/2019   INSULIN 14.5 11/30/2018   Lab Results  Component Value Date   TSH 4.280 11/30/2018   Lab Results  Component Value Date   CHOL 261 (H) 05/29/2020   HDL 49 05/29/2020   LDLCALC 175 (H) 05/29/2020   TRIG 197 (H)  05/29/2020   CHOLHDL 4.4 11/30/2018   Lab Results  Component Value Date   WBC 9.8 11/30/2018   HGB 13.6 11/30/2018   HCT 39.1 11/30/2018   MCV 87 11/30/2018   PLT 303 11/30/2018   No results found for: IRON, TIBC, FERRITIN  Attestation Statements:   Reviewed by clinician on day of visit: allergies, medications, problem list, medical history, surgical history, family history, social history, and previous encounter notes.   I, Trixie Dredge, am acting as transcriptionist for Dennard Nip, MD.  I have reviewed the above documentation for accuracy and completeness, and I agree with the above. -  Dennard Nip, MD

## 2020-07-17 MED FILL — VIT D2 1.25 MG (50,000 UNIT: 1.25 MG | 28 days supply | Qty: 4 | Fill #0

## 2020-07-18 DIAGNOSIS — Z1231 Encounter for screening mammogram for malignant neoplasm of breast: Secondary | ICD-10-CM | POA: Diagnosis not present

## 2020-07-30 ENCOUNTER — Other Ambulatory Visit: Payer: Self-pay | Admitting: Family Medicine

## 2020-07-30 MED ORDER — AMPHETAMINE-DEXTROAMPHET ER 25 MG PO CP24: 25.0000 mg | ORAL_CAPSULE | ORAL | 0 refills | Status: DC

## 2020-07-30 MED FILL — ADDERALL XR 25 MG CAPSULE: 25 | 30 days supply | Qty: 30 | Fill #0

## 2020-07-30 MED FILL — FUROSEMIDE 20 MG TABS: 20 | 30 days supply | Qty: 60 | Fill #1

## 2020-07-30 NOTE — Telephone Encounter (Signed)
Requested Prescriptions   Pending Prescriptions Disp Refills  . amphetamine-dextroamphetamine (ADDERALL XR) 25 MG 24 hr capsule 30 capsule 0    Sig: Take 1 capsule by mouth every morning.    Last OV 01/18/2020   Last written 06/13/2020

## 2020-08-12 ENCOUNTER — Encounter (INDEPENDENT_AMBULATORY_CARE_PROVIDER_SITE_OTHER): Payer: Self-pay | Admitting: Family Medicine

## 2020-08-12 ENCOUNTER — Ambulatory Visit (INDEPENDENT_AMBULATORY_CARE_PROVIDER_SITE_OTHER): Payer: 59 | Admitting: Family Medicine

## 2020-08-12 ENCOUNTER — Other Ambulatory Visit: Payer: Self-pay

## 2020-08-12 VITALS — BP 121/77 | HR 75 | Temp 97.8°F | Ht 63.0 in | Wt 210.0 lb

## 2020-08-12 DIAGNOSIS — E559 Vitamin D deficiency, unspecified: Secondary | ICD-10-CM | POA: Diagnosis not present

## 2020-08-12 DIAGNOSIS — Z9189 Other specified personal risk factors, not elsewhere classified: Secondary | ICD-10-CM

## 2020-08-12 DIAGNOSIS — Z6837 Body mass index (BMI) 37.0-37.9, adult: Secondary | ICD-10-CM

## 2020-08-12 DIAGNOSIS — R7303 Prediabetes: Secondary | ICD-10-CM | POA: Diagnosis not present

## 2020-08-12 MED ORDER — METFORMIN HCL 500 MG PO TABS: 500.0000 mg | ORAL_TABLET | Freq: Three times a day (TID) | ORAL | 0 refills | Status: DC

## 2020-08-12 MED ORDER — VITAMIN D (ERGOCALCIFEROL) 1.25 MG (50000 UNIT) PO CAPS: 50000.0000 [IU] | ORAL_CAPSULE | ORAL | 0 refills | Status: DC

## 2020-08-12 MED FILL — VIT D2 1.25 MG (50,000 UNIT: 1.25 MG | 28 days supply | Qty: 4 | Fill #0

## 2020-08-12 MED FILL — METFORMIN HCL 500 MG TABS: 500 | 30 days supply | Qty: 90 | Fill #0

## 2020-08-12 NOTE — Progress Notes (Signed)
Chief Complaint:   OBESITY [name] is here to discuss her progress with her obesity treatment plan along with follow-up of her obesity related diagnoses. [name] is on the Category 3 Plan and keeping a food journal and adhering to recommended goals of 400-600 calories and 40+ grams of protein at supper daily and states she is following her eating plan approximately 80-90% of the time. [name] states she is walking about a mile per day for 20-25 minutes 5 times per week.  Today's visit was #: 22 Starting weight: 235 lbs Starting date: 11/30/2018 Today's weight: 210 lbs Today's date: 08/12/2020 Total lbs lost to date: 25 Total lbs lost since last in-office visit: 0  Interim History: [name] notes increased stress at home, and she has struggled with increased hunger especially in the evenings. She notes increased stress at home but she continues to work on meal planning. She started using her CPAP more regularly again.  Subjective:   1. Pre-diabetes [name] notes polyphagia, especially in the evening, and she is tolerating metformin well.  2. Vitamin D deficiency [name] is stable on Vit D, and she denies nausea or vomiting. She requests a refill today.  3. At risk for diabetes mellitus [name] is at higher than average risk for developing diabetes due to her obesity.   Assessment/Plan:   1. Pre-diabetes [name] will continue to work on weight loss, exercise, and decreasing simple carbohydrates to help decrease the risk of diabetes. [name] agreed to increase metformin to 500 mg TID with no refill, and will follow closely.   - metFORMIN (GLUCOPHAGE) 500 MG tablet; Take 1 tablet (500 mg total) by mouth in the morning, at noon, and at bedtime.  Dispense: 90 tablet; Refill: 0  2. Vitamin D deficiency Low Vitamin D level contributes to fatigue and are associated with obesity, breast, and colon cancer. We will refill prescription Vitamin D for 1 month. [name] will follow-up for  routine testing of Vitamin D, at least 2-3 times per year to avoid over-replacement.  - Vitamin D, Ergocalciferol, (DRISDOL) 1.25 MG (50000 UNIT) CAPS capsule; Take 1 capsule (50,000 Units total) by mouth every 7 (seven) days.  Dispense: 4 capsule; Refill: 0  3. At risk for diabetes mellitus [name] was given approximately 15 minutes of diabetes education and counseling today. We discussed intensive lifestyle modifications today with an emphasis on weight loss as well as increasing exercise and decreasing simple carbohydrates in her diet. We also reviewed medication options with an emphasis on risk versus benefit of those discussed.   Repetitive spaced learning was employed today to elicit superior memory formation and behavioral change.  4. Class 2 severe obesity with serious comorbidity and body mass index (BMI) of 37.0 to 37.9 in adult, unspecified obesity type (HCC) [name] is currently in the action stage of change. As such, her goal is to continue with weight loss efforts. She has agreed to the Category 3 Plan.   Exercise goals: As is.  Behavioral modification strategies: increasing lean protein intake, decreasing simple carbohydrates, meal planning and cooking strategies and ways to avoid night time snacking.  [name] has agreed to follow-up with our clinic in 3 weeks. She was informed of the importance of frequent follow-up visits to maximize her success with intensive lifestyle modifications for her multiple health conditions.   Objective:   Blood pressure 121/77, pulse 75, temperature 97.8 F (36.6 C), height 5\' 3"  (1.6 m), weight 210 lb (95.3 kg), SpO2 97 %. Body mass index is 37.2 kg/m.  General: Cooperative, alert, well developed, in no acute distress. HEENT: Conjunctivae and lids unremarkable. Cardiovascular: Regular rhythm.  Lungs: Normal work of breathing. Neurologic: No focal deficits.   Lab Results  Component Value Date   CREATININE 0.78 05/29/2020   BUN 21  05/29/2020   NA 141 05/29/2020   K 4.4 05/29/2020   CL 101 05/29/2020   CO2 22 05/29/2020   Lab Results  Component Value Date   ALT 11 05/29/2020   AST 13 05/29/2020   ALKPHOS 73 05/29/2020   BILITOT 0.3 05/29/2020   Lab Results  Component Value Date   HGBA1C 5.6 05/29/2020   HGBA1C 5.4 11/27/2019   HGBA1C 5.4 06/27/2019   HGBA1C 6.0 (H) 11/30/2018   Lab Results  Component Value Date   INSULIN 15.2 05/29/2020   INSULIN 10.3 11/27/2019   INSULIN 12.5 06/27/2019   INSULIN 14.5 11/30/2018   Lab Results  Component Value Date   TSH 4.280 11/30/2018   Lab Results  Component Value Date   CHOL 261 (H) 05/29/2020   HDL 49 05/29/2020   LDLCALC 175 (H) 05/29/2020   TRIG 197 (H) 05/29/2020   CHOLHDL 4.4 11/30/2018   Lab Results  Component Value Date   WBC 9.8 11/30/2018   HGB 13.6 11/30/2018   HCT 39.1 11/30/2018   MCV 87 11/30/2018   PLT 303 11/30/2018   No results found for: IRON, TIBC, FERRITIN  Attestation Statements:   Reviewed by clinician on day of visit: allergies, medications, problem list, medical history, surgical history, family history, social history, and previous encounter notes.   I, Trixie Dredge, am acting as transcriptionist for Dennard Nip, MD.  I have reviewed the above documentation for accuracy and completeness, and I agree with the above. -  Dennard Nip, MD

## 2020-08-15 ENCOUNTER — Other Ambulatory Visit: Payer: Self-pay | Admitting: Family Medicine

## 2020-08-15 ENCOUNTER — Telehealth: Payer: Self-pay

## 2020-08-15 MED ORDER — NITROFURANTOIN MONOHYD MACRO 100 MG PO CAPS: 100.0000 mg | ORAL_CAPSULE | Freq: Two times a day (BID) | ORAL | 0 refills | Status: DC

## 2020-08-15 MED FILL — NITROFURANTOIN MONO-MCR 100: 100 | 7 days supply | Qty: 14 | Fill #0

## 2020-08-15 NOTE — Telephone Encounter (Signed)
The patient states she woke this morning with burning with urination and frequency. She has not noticed any odor to the urine. Denies fever, chills, nausea/vomiting.  Did notice some blood on the tissue when she wiped (no visible blood in the urine). Denies vaginal issues/bleeding. She is asking for an antibiotic, in case this is a UTI. She can give [name] a specimen to send in, if needed. No drug allergy. Uses [facility]. Please advise.

## 2020-08-15 NOTE — Telephone Encounter (Signed)
Macrobid Rx sent

## 2020-08-21 DIAGNOSIS — Z6838 Body mass index (BMI) 38.0-38.9, adult: Secondary | ICD-10-CM | POA: Diagnosis not present

## 2020-08-21 DIAGNOSIS — Z01419 Encounter for gynecological examination (general) (routine) without abnormal findings: Secondary | ICD-10-CM | POA: Diagnosis not present

## 2020-09-02 ENCOUNTER — Other Ambulatory Visit (INDEPENDENT_AMBULATORY_CARE_PROVIDER_SITE_OTHER): Payer: Self-pay | Admitting: Family Medicine

## 2020-09-02 ENCOUNTER — Encounter (INDEPENDENT_AMBULATORY_CARE_PROVIDER_SITE_OTHER): Payer: Self-pay | Admitting: Family Medicine

## 2020-09-02 ENCOUNTER — Other Ambulatory Visit: Payer: Self-pay

## 2020-09-02 ENCOUNTER — Ambulatory Visit (INDEPENDENT_AMBULATORY_CARE_PROVIDER_SITE_OTHER): Payer: 59 | Admitting: Family Medicine

## 2020-09-02 VITALS — BP 112/73 | HR 76 | Temp 98.0°F | Ht 63.0 in | Wt 208.0 lb

## 2020-09-02 DIAGNOSIS — R7303 Prediabetes: Secondary | ICD-10-CM | POA: Diagnosis not present

## 2020-09-02 DIAGNOSIS — Z6837 Body mass index (BMI) 37.0-37.9, adult: Secondary | ICD-10-CM | POA: Diagnosis not present

## 2020-09-02 DIAGNOSIS — Z9189 Other specified personal risk factors, not elsewhere classified: Secondary | ICD-10-CM | POA: Diagnosis not present

## 2020-09-02 MED ORDER — METFORMIN HCL 500 MG PO TABS: 500.0000 mg | ORAL_TABLET | Freq: Three times a day (TID) | ORAL | 0 refills | Status: DC

## 2020-09-05 MED FILL — METFORMIN HCL 500 MG TABS: 500 | 30 days supply | Qty: 90 | Fill #0

## 2020-09-09 DIAGNOSIS — H903 Sensorineural hearing loss, bilateral: Secondary | ICD-10-CM | POA: Diagnosis not present

## 2020-09-09 NOTE — Progress Notes (Signed)
Chief Complaint:   OBESITY [name] is here to discuss her progress with her obesity treatment plan along with follow-up of her obesity related diagnoses. [name] is on the Category 3 Plan and states she is following her eating plan approximately 85-90% of the time. [name] states she is walking for 20-25 minutes 5 times per week.  Today's visit was #: 58 Starting weight: 235 lbs Starting date: 11/30/2018 Today's weight: 208 lbs Today's date: 09/02/2020 Total lbs lost to date: 27 Total lbs lost since last in-office visit: 2  Interim History: [name] continues to do well with weight loss on her plan. She likes the freedom of journaling for dinner, but struggles to get all of her protein in. She notes increased temptations at work as well.  Subjective:   1. Pre-diabetes [name] notes mild nausea with increased dose of metformin, but this appears to be improving now.  2. At risk for impaired metabolic function [name] is at increased risk for impaired metabolic function due to decreased protein.  Assessment/Plan:   1. Pre-diabetes [name] will continue to work on weight loss, diet, exercise, and decreasing simple carbohydrates to help decrease the risk of diabetes. We will refill metformin for 1 month.  - metFORMIN (GLUCOPHAGE) 500 MG tablet; Take 1 tablet (500 mg total) by mouth in the morning, at noon, and at bedtime.  Dispense: 90 tablet; Refill: 0  2. At risk for impaired metabolic function [name] was given approximately 15 minutes of impaired  metabolic function prevention counseling today. We discussed intensive lifestyle modifications today with an emphasis on specific nutrition and exercise instructions and strategies.   Repetitive spaced learning was employed today to elicit superior memory formation and behavioral change.  3. Class 2 severe obesity with serious comorbidity and body mass index (BMI) of 37.0 to 37.9 in adult, unspecified obesity type (HCC) [name] is  currently in the action stage of change. As such, her goal is to continue with weight loss efforts. She has agreed to the Category 3 Plan and keeping a food journal and adhering to recommended goals of 400-600 calories and 40 grams of protein at supper daily.   Recipes were given today.  Exercise goals: As is.  Behavioral modification strategies: meal planning and cooking strategies and dealing with family or coworker sabotage.  [name] has agreed to follow-up with our clinic in 3 weeks. She was informed of the importance of frequent follow-up visits to maximize her success with intensive lifestyle modifications for her multiple health conditions.   Objective:   Blood pressure 112/73, pulse 76, temperature 98 F (36.7 C), height 5\' 3"  (1.6 m), weight 208 lb (94.3 kg), SpO2 98 %. Body mass index is 36.85 kg/m.  General: Cooperative, alert, well developed, in no acute distress. HEENT: Conjunctivae and lids unremarkable. Cardiovascular: Regular rhythm.  Lungs: Normal work of breathing. Neurologic: No focal deficits.   Lab Results  Component Value Date   CREATININE 0.78 05/29/2020   BUN 21 05/29/2020   NA 141 05/29/2020   K 4.4 05/29/2020   CL 101 05/29/2020   CO2 22 05/29/2020   Lab Results  Component Value Date   ALT 11 05/29/2020   AST 13 05/29/2020   ALKPHOS 73 05/29/2020   BILITOT 0.3 05/29/2020   Lab Results  Component Value Date   HGBA1C 5.6 05/29/2020   HGBA1C 5.4 11/27/2019   HGBA1C 5.4 06/27/2019   HGBA1C 6.0 (H) 11/30/2018   Lab Results  Component Value Date   INSULIN 15.2 05/29/2020  INSULIN 10.3 11/27/2019   INSULIN 12.5 06/27/2019   INSULIN 14.5 11/30/2018   Lab Results  Component Value Date   TSH 4.280 11/30/2018   Lab Results  Component Value Date   CHOL 261 (H) 05/29/2020   HDL 49 05/29/2020   LDLCALC 175 (H) 05/29/2020   TRIG 197 (H) 05/29/2020   CHOLHDL 4.4 11/30/2018   Lab Results  Component Value Date   WBC 9.8 11/30/2018   HGB  13.6 11/30/2018   HCT 39.1 11/30/2018   MCV 87 11/30/2018   PLT 303 11/30/2018   No results found for: IRON, TIBC, FERRITIN  Attestation Statements:   Reviewed by clinician on day of visit: allergies, medications, problem list, medical history, surgical history, family history, social history, and previous encounter notes.   I, Trixie Dredge, am acting as transcriptionist for Dennard Nip, MD.  I have reviewed the above documentation for accuracy and completeness, and I agree with the above. -  Dennard Nip, MD

## 2020-09-10 ENCOUNTER — Other Ambulatory Visit: Payer: Self-pay | Admitting: Family Medicine

## 2020-09-10 MED ORDER — AMOXICILLIN 500 MG PO TABS: 1000.0000 mg | ORAL_TABLET | Freq: Two times a day (BID) | ORAL | 0 refills | Status: DC

## 2020-09-10 MED FILL — AMOXICILLIN 500 MG CAPSULE: 500 | 10 days supply | Qty: 40 | Fill #0

## 2020-09-10 NOTE — Progress Notes (Signed)
Woke up with sore throat and white discharge.  No fever.  Will test for strep and treat with amoxicillin.

## 2020-09-12 ENCOUNTER — Encounter (INDEPENDENT_AMBULATORY_CARE_PROVIDER_SITE_OTHER): Payer: Self-pay

## 2020-09-12 ENCOUNTER — Other Ambulatory Visit (INDEPENDENT_AMBULATORY_CARE_PROVIDER_SITE_OTHER): Payer: Self-pay | Admitting: Family Medicine

## 2020-09-12 ENCOUNTER — Other Ambulatory Visit: Payer: Self-pay | Admitting: Family Medicine

## 2020-09-12 DIAGNOSIS — E559 Vitamin D deficiency, unspecified: Secondary | ICD-10-CM

## 2020-09-12 MED ORDER — VITAMIN D (ERGOCALCIFEROL) 1.25 MG (50000 UNIT) PO CAPS: 50000.0000 [IU] | ORAL_CAPSULE | ORAL | 0 refills | Status: DC

## 2020-09-12 MED ORDER — AMPHETAMINE-DEXTROAMPHET ER 25 MG PO CP24
25.0000 mg | ORAL_CAPSULE | ORAL | 0 refills | Status: DC
Start: 2020-09-12 — End: 2020-10-30

## 2020-09-12 MED FILL — VIT D2 1.25 MG (50,000 UNIT: 1.25 MG | 28 days supply | Qty: 4 | Fill #0

## 2020-09-12 MED FILL — ADDERALL XR 25 MG CAPSULE: 25 | 30 days supply | Qty: 30 | Fill #0

## 2020-09-12 NOTE — Telephone Encounter (Signed)
MyChart message sent to pt to find out if they have enough medication to get them through until next appt.   

## 2020-09-17 ENCOUNTER — Other Ambulatory Visit: Payer: Self-pay | Admitting: Family Medicine

## 2020-09-17 MED ORDER — FLUCONAZOLE 150 MG PO TABS: 150.0000 mg | ORAL_TABLET | ORAL | 0 refills | Status: DC

## 2020-09-17 MED FILL — FLUCONAZOLE 150 MG TABS: 150 | 15 days supply | Qty: 5 | Fill #0

## 2020-09-25 ENCOUNTER — Other Ambulatory Visit (INDEPENDENT_AMBULATORY_CARE_PROVIDER_SITE_OTHER): Payer: Self-pay | Admitting: Family Medicine

## 2020-09-25 ENCOUNTER — Encounter (INDEPENDENT_AMBULATORY_CARE_PROVIDER_SITE_OTHER): Payer: Self-pay | Admitting: Family Medicine

## 2020-09-25 ENCOUNTER — Other Ambulatory Visit: Payer: Self-pay

## 2020-09-25 ENCOUNTER — Ambulatory Visit (INDEPENDENT_AMBULATORY_CARE_PROVIDER_SITE_OTHER): Payer: 59 | Admitting: Family Medicine

## 2020-09-25 VITALS — BP 113/74 | HR 93 | Temp 97.7°F | Ht 63.0 in | Wt 208.0 lb

## 2020-09-25 DIAGNOSIS — E66812 Obesity, class 2: Secondary | ICD-10-CM

## 2020-09-25 DIAGNOSIS — Z9189 Other specified personal risk factors, not elsewhere classified: Secondary | ICD-10-CM | POA: Diagnosis not present

## 2020-09-25 DIAGNOSIS — R7303 Prediabetes: Secondary | ICD-10-CM | POA: Diagnosis not present

## 2020-09-25 DIAGNOSIS — Z6837 Body mass index (BMI) 37.0-37.9, adult: Secondary | ICD-10-CM

## 2020-09-25 MED ORDER — SAXENDA 18 MG/3ML ~~LOC~~ SOPN: 3.0000 mg | PEN_INJECTOR | Freq: Every day | SUBCUTANEOUS | 0 refills | Status: DC

## 2020-09-25 MED ORDER — BD PEN NEEDLE NANO 2ND GEN 32G X 4 MM MISC: 0 refills | Status: DC

## 2020-09-26 ENCOUNTER — Telehealth (INDEPENDENT_AMBULATORY_CARE_PROVIDER_SITE_OTHER): Payer: Self-pay

## 2020-09-26 NOTE — Progress Notes (Signed)
Chief Complaint:   OBESITY [name] is here to discuss her progress with her obesity treatment plan along with follow-up of her obesity related diagnoses. [name] is on the Category 3 Plan and keeping a food journal and adhering to recommended goals of 400-600 calories and 40 grams of protein at supper daily and states she is following her eating plan approximately 85% of the time. [name] states she is walking for 25 minutes 5 times per week.  Today's visit was #: 76 Starting weight: 235 lbs Starting date: 11/30/2018 Today's weight: 208 lbs Today's date: 09/25/2020 Total lbs lost to date: 27 Total lbs lost since last in-office visit: 0  Interim History: [name] continues to do well with maintaining her weight loss. She is walking at work with her co-workers at lunch and she feels this is sustainable. She notes her husband is gaining weight and this concerns her.  Subjective:   1. Pre-diabetes [name] is on metformin TID but still notes polyphagia. She also notes increased yeast infections and she wonders if this is due to metformin.  2. At risk for nausea [name] is at risk for nausea due to [name].  Assessment/Plan:   1. Pre-diabetes [name] will continue to work on weight loss, exercise, and decreasing simple carbohydrates to help decrease the risk of diabetes. [name] was informed that metformin is not likely the cause of her infections, but she will start [place] and when dose is increased to 1.2 mg then she will taper down on metformin. She will follow closely.   2. At risk for nausea [name] was given approximately 15 minutes of nausea prevention counseling today. [name] is at risk for nausea due to her new or current medication. She was encouraged to titrate her medication slowly, make sure to stay hydrated, eat smaller portions throughout the day, and avoid high fat meals.   3. Class 2 severe obesity with serious comorbidity and body mass index (BMI) of 37.0  to 37.9 in adult, unspecified obesity type (HCC) [name] is currently in the action stage of change. As such, her goal is to continue with weight loss efforts. She has agreed to the Category 3 Plan.   We discussed various medication options to help [name] with her weight loss efforts and we both agreed to start Saxenda 3.0 mg #5 pens with no refills, and nano needles #100 with no refills (she is to start at 0.6 mg until her next visit).  - Liraglutide -Weight Management (SAXENDA) 18 MG/3ML SOPN; Inject 3 mg into the skin daily.  Dispense: 15 mL; Refill: 0 - Insulin Pen Needle (BD PEN NEEDLE NANO 2ND GEN) 32G X 4 MM MISC; Use once daily to inject Saxenda.  Dispense: 100 each; Refill: 0  Exercise goals: As is.  Behavioral modification strategies: increasing lean protein intake and holiday eating strategies .  [name] has agreed to follow-up with our clinic in 3 weeks. She was informed of the importance of frequent follow-up visits to maximize her success with intensive lifestyle modifications for her multiple health conditions.   Objective:   Blood pressure 113/74, pulse 93, temperature 97.7 F (36.5 C), height 5\' 3"  (1.6 m), weight 208 lb (94.3 kg), SpO2 97 %. Body mass index is 36.85 kg/m.  General: Cooperative, alert, well developed, in no acute distress. HEENT: Conjunctivae and lids unremarkable. Cardiovascular: Regular rhythm.  Lungs: Normal work of breathing. Neurologic: No focal deficits.   Lab Results  Component Value Date   CREATININE 0.78 05/29/2020  BUN 21 05/29/2020   NA 141 05/29/2020   K 4.4 05/29/2020   CL 101 05/29/2020   CO2 22 05/29/2020   Lab Results  Component Value Date   ALT 11 05/29/2020   AST 13 05/29/2020   ALKPHOS 73 05/29/2020   BILITOT 0.3 05/29/2020   Lab Results  Component Value Date   HGBA1C 5.6 05/29/2020   HGBA1C 5.4 11/27/2019   HGBA1C 5.4 06/27/2019   HGBA1C 6.0 (H) 11/30/2018   Lab Results  Component Value Date   INSULIN 15.2  05/29/2020   INSULIN 10.3 11/27/2019   INSULIN 12.5 06/27/2019   INSULIN 14.5 11/30/2018   Lab Results  Component Value Date   TSH 4.280 11/30/2018   Lab Results  Component Value Date   CHOL 261 (H) 05/29/2020   HDL 49 05/29/2020   LDLCALC 175 (H) 05/29/2020   TRIG 197 (H) 05/29/2020   CHOLHDL 4.4 11/30/2018   Lab Results  Component Value Date   WBC 9.8 11/30/2018   HGB 13.6 11/30/2018   HCT 39.1 11/30/2018   MCV 87 11/30/2018   PLT 303 11/30/2018   No results found for: IRON, TIBC, FERRITIN  Attestation Statements:   Reviewed by clinician on day of visit: allergies, medications, problem list, medical history, surgical history, family history, social history, and previous encounter notes.   I, Trixie Dredge, am acting as transcriptionist for Dennard Nip, MD.  I have reviewed the above documentation for accuracy and completeness, and I agree with the above. -  Dennard Nip, MD

## 2020-09-26 NOTE — Telephone Encounter (Signed)
PA initiated via [email or web address] for Saxenda..  Currently waiting on insurance company to respond via covermymeds with clinical questions.  [employer] is processing your PA request and will respond shortly with next steps. You may close this dialog, return to your dashboard, and perform other tasks. To check for an update later, open this request again from your dashboard.  If you need assistance, please chat with CoverMyMeds or call us at [phone].   [facility] (Key: [id number]) Rx #: [id number] Saxenda 18MG F[place]ne Mediate pen-injectors   Form [employer] ePA Form 2017 NCPDP

## 2020-09-27 MED FILL — SAXENDA 18 MG/3 ML PEN: 18 | 30 days supply | Qty: 15 | Fill #0

## 2020-09-27 MED FILL — UNIFINE PENTIPS 32GX5/32: 32G X 4 MM | 90 days supply | Qty: 100 | Fill #0

## 2020-09-30 ENCOUNTER — Encounter (INDEPENDENT_AMBULATORY_CARE_PROVIDER_SITE_OTHER): Payer: Self-pay

## 2020-09-30 NOTE — Telephone Encounter (Signed)
PA for [name] has been approved.   Approval is good for a maximum of 4 refills from 09/26/20 through 01/23/2021.  Pt notified via [place].

## 2020-10-03 ENCOUNTER — Other Ambulatory Visit: Payer: 59 | Admitting: *Deleted

## 2020-10-03 ENCOUNTER — Encounter (INDEPENDENT_AMBULATORY_CARE_PROVIDER_SITE_OTHER): Payer: Self-pay | Admitting: Family Medicine

## 2020-10-03 ENCOUNTER — Other Ambulatory Visit: Payer: Self-pay

## 2020-10-03 ENCOUNTER — Telehealth: Payer: Self-pay | Admitting: Cardiovascular Disease

## 2020-10-03 ENCOUNTER — Ambulatory Visit (INDEPENDENT_AMBULATORY_CARE_PROVIDER_SITE_OTHER): Payer: 59

## 2020-10-03 DIAGNOSIS — R002 Palpitations: Secondary | ICD-10-CM | POA: Diagnosis not present

## 2020-10-03 NOTE — Telephone Encounter (Signed)
Patient c/o Palpitations:  High priority if patient c/o lightheadedness, shortness of breath, or chest pain  1) How long have you had palpitations/irregular HR/ Afib? Are you having the symptoms now? Yes, started this morning  2) Are you currently experiencing lightheadedness, SOB or CP? Lightheadedness, pain in shoulder, nausea, chest pain last night  3) Do you have a history of afib (atrial fibrillation) or irregular heart rhythm? yes  4) Have you checked your BP or HR? (document readings if available): HR 102 while sitting  5) Are you experiencing any other symptoms? Dizziness and nausea   Patient states she started having palpitations this morning and is also having dizziness and nausea. She states last night she had some chest pain. She states she also has shoulder pain.

## 2020-10-03 NOTE — Patient Instructions (Signed)
Medication Instructions:  Your physician recommends that you continue on your current medications as directed. Please refer to the Current Medication list given to you today.  Labwork: You will get lab work today:  BMP and TSH  Testing/Procedures: None ordered.  Follow-Up:  Follow up as previously scheduled.  Any Other Special Instructions Will Be Listed Below (If Applicable).  If you need a refill on your cardiac medications before your next appointment, please call your pharmacy.

## 2020-10-03 NOTE — Progress Notes (Addendum)
1.) Reason for visit: Pt c/o palpitations, dizziness and lightheadedness  2.) Name of MD requesting visit: Dr. Acie Fredrickson  3.) H&P: Pt with a history of palpitations, sinus tachycardia, hypertension  4.) ROS related to problem: Pt called office for palpitations off and on since last Friday.   See phone note from today 10/03/2020 for complete documentation.  Discussed case with primary cardiologist Dr. Acie Fredrickson.  Per Dr. Acie Fredrickson have Pt come to office for nurse visit for EKG and lab work.  5.) Assessment and plan per MD: Per Dr. Acie Fredrickson, Pt with few PAC's.  Pt educated on the benign nature of these heart beats.  Pt will get lab work to look for electrolyte imbalance.  Pt advised to drink more fluids with electrolytes in them versus just plain water.  Pt will follow up as scheduled with Dr. Acie Fredrickson.   Attending note: Pt has had nausea, some vomitting since last week. No fever, no covid symptoms. Does not drink caffeine. No CP or severe dyspnea to suggest ischemia or pulmonary . Exam BP is mildly elevated Lungs - clear Cor - RR Abd: benign Ext- no edema  ECG is reassuring - NSR with PACs Will check BMP, TSH,    Mertie Moores, MD  10/03/2020 2:54 PM    [facility] [address],  [place], [place]  [address] Phone: [phone]; Fax: [phone]

## 2020-10-03 NOTE — Telephone Encounter (Signed)
Dr Beasley pt 

## 2020-10-03 NOTE — Telephone Encounter (Signed)
Returned call to Pt.  Pt states she had an episode of palpitations lasts Friday associated with lightheadedness and dizziness.  She then had another episode starting last night in the middle of the night.  She woke up with stomach cramps.  But then the pain extended up to her chest.  She states she "threw up a little bit".  The palpitations have continued all day today.  Pt states her palpitations have never lasted this long before and she feels like the quality of the palpitations are a little different than historically.  BP was taken by her coworkers and is 120/78.  Heart rate resting 102 BPM.  Discussed with Dr. Acie Fredrickson.  Will have Pt come in for EKG and BMP.  Will follow.

## 2020-10-04 LAB — BASIC METABOLIC PANEL
BUN/Creatinine Ratio: 29 — ABNORMAL HIGH (ref 9–23)
BUN: 19 mg/dL (ref 6–24)
CO2: 26 mmol/L (ref 20–29)
Calcium: 10.1 mg/dL (ref 8.7–10.2)
Chloride: 101 mmol/L (ref 96–106)
Creatinine, Ser: 0.66 mg/dL (ref 0.57–1.00)
GFR calc Af Amer: 123 mL/min/{1.73_m2} (ref >59)
GFR calc non Af Amer: 106 mL/min/{1.73_m2} (ref >59)
Glucose: 82 mg/dL (ref 65–99)
Potassium: 4.3 mmol/L (ref 3.5–5.2)
Sodium: 140 mmol/L (ref 134–144)

## 2020-10-04 LAB — TSH: TSH: 4.04 u[IU]/mL (ref 0.450–4.500)

## 2020-10-07 ENCOUNTER — Ambulatory Visit (INDEPENDENT_AMBULATORY_CARE_PROVIDER_SITE_OTHER): Payer: 59

## 2020-10-07 ENCOUNTER — Other Ambulatory Visit: Payer: Self-pay | Admitting: Nurse Practitioner

## 2020-10-07 DIAGNOSIS — R002 Palpitations: Secondary | ICD-10-CM

## 2020-10-07 DIAGNOSIS — R000 Tachycardia, unspecified: Secondary | ICD-10-CM

## 2020-10-14 ENCOUNTER — Other Ambulatory Visit: Payer: Self-pay | Admitting: Cardiovascular Disease

## 2020-10-14 MED ORDER — METOPROLOL TARTRATE 25 MG PO TABS
25.0000 mg | ORAL_TABLET | Freq: Two times a day (BID) | ORAL | 0 refills | Status: DC
Start: 2020-10-14 — End: 2020-10-14

## 2020-10-14 MED FILL — METOPROLOL TARTRATE 25 MG T: 25 | 90 days supply | Qty: 180 | Fill #0

## 2020-10-16 ENCOUNTER — Encounter (INDEPENDENT_AMBULATORY_CARE_PROVIDER_SITE_OTHER): Payer: Self-pay | Admitting: Family Medicine

## 2020-10-16 ENCOUNTER — Other Ambulatory Visit (INDEPENDENT_AMBULATORY_CARE_PROVIDER_SITE_OTHER): Payer: Self-pay | Admitting: Family Medicine

## 2020-10-16 ENCOUNTER — Ambulatory Visit (INDEPENDENT_AMBULATORY_CARE_PROVIDER_SITE_OTHER): Payer: 59 | Admitting: Family Medicine

## 2020-10-16 ENCOUNTER — Other Ambulatory Visit: Payer: Self-pay

## 2020-10-16 VITALS — BP 116/74 | HR 87 | Temp 98.0°F | Ht 63.0 in | Wt 208.0 lb

## 2020-10-16 DIAGNOSIS — Z9189 Other specified personal risk factors, not elsewhere classified: Secondary | ICD-10-CM

## 2020-10-16 DIAGNOSIS — Z6836 Body mass index (BMI) 36.0-36.9, adult: Secondary | ICD-10-CM | POA: Diagnosis not present

## 2020-10-16 DIAGNOSIS — R000 Tachycardia, unspecified: Secondary | ICD-10-CM

## 2020-10-16 DIAGNOSIS — E559 Vitamin D deficiency, unspecified: Secondary | ICD-10-CM | POA: Diagnosis not present

## 2020-10-16 DIAGNOSIS — E8881 Metabolic syndrome: Secondary | ICD-10-CM | POA: Diagnosis not present

## 2020-10-16 DIAGNOSIS — E88819 Insulin resistance, unspecified: Secondary | ICD-10-CM

## 2020-10-16 DIAGNOSIS — R002 Palpitations: Secondary | ICD-10-CM | POA: Diagnosis not present

## 2020-10-16 MED ORDER — VITAMIN D (ERGOCALCIFEROL) 1.25 MG (50000 UNIT) PO CAPS: 50000.0000 [IU] | ORAL_CAPSULE | ORAL | 0 refills | Status: DC

## 2020-10-16 MED FILL — VIT D2 1.25 MG (50,000 UNIT: 1.25 MG | 28 days supply | Qty: 4 | Fill #0

## 2020-10-21 NOTE — Progress Notes (Signed)
Chief Complaint:   OBESITY [name] is here to discuss her progress with her obesity treatment plan along with follow-up of her obesity related diagnoses. [name] is on the Category 3 Plan and states she is following her eating plan approximately 90-95% of the time. [name] states she is walking 1 mile 5 times per week.  Today's visit was #: 44 Starting weight: 235 lbs Starting date: 11/30/2018 Today's weight: 208 lbs Today's date: 10/16/2020 Total lbs lost to date: 27 Total lbs lost since last in-office visit: 0  Interim History: [name] has done well maintaining her weight over Thanksgiving. She is under a lot of stress at work with being short-staffed, and she feels there will be a lot more temptations at work this month.  Subjective:   1. Insulin resistance [name] is working on diet and exercise, and she is tolerating metformin well with normal renal function.  2. Vitamin D deficiency [name] is stable on Vit D, and she requests a refill today.  3. At risk for hyperglycemia [name] is at increased risk for hyperglycemia due to seasonal temptations.  Assessment/Plan:   1. Insulin resistance [name] was warned of extra temptations in December especially at work, and we discussed strategies to help minimize simple carbohydrates. [name] agreed to follow-up with [name] as directed to closely monitor her progress.  2. Vitamin D deficiency Low Vitamin D level contributes to fatigue and are associated with obesity, breast, and colon cancer. We will refill prescription Vitamin D for 1 month, and we will recheck labs in 1 month. [name] will follow-up for routine testing of Vitamin D, at least 2-3 times per year to avoid over-replacement.  - Vitamin D, Ergocalciferol, (DRISDOL) 1.25 MG (50000 UNIT) CAPS capsule; Take 1 capsule (50,000 Units total) by mouth every 7 (seven) days.  Dispense: 4 capsule; Refill: 0  3. At risk for hyperglycemia [name] was given approximately 15 minutes  of counseling today regarding prevention of hyperglycemia. She was advised of hyperglycemia causes and the fact hyperglycemia is often asymptomatic. [name] was instructed to avoid skipping meals, eat regular protein rich meals and schedule low calorie but protein rich snacks as needed.   Repetitive spaced learning was employed today to elicit superior memory formation and behavioral change  4. Class 2 severe obesity with serious comorbidity and body mass index (BMI) of 36.0 to 36.9 in adult, unspecified obesity type (HCC) [name] is currently in the action stage of change. As such, her goal is to continue with weight loss efforts. She has agreed to the Category 3 Plan.   Exercise goals: As is.  Behavioral modification strategies: better snacking choices, emotional eating strategies and planning for success.  [name] has agreed to follow-up with our clinic in 2 to 3 weeks. She was informed of the importance of frequent follow-up visits to maximize her success with intensive lifestyle modifications for her multiple health conditions.   Objective:   Blood pressure 116/74, pulse 87, temperature 98 F (36.7 C), height 5\' 3"  (1.6 m), weight 208 lb (94.3 kg), SpO2 98 %. Body mass index is 36.85 kg/m.  General: Cooperative, alert, well developed, in no acute distress. HEENT: Conjunctivae and lids unremarkable. Cardiovascular: Regular rhythm.  Lungs: Normal work of breathing. Neurologic: No focal deficits.   Lab Results  Component Value Date   CREATININE 0.66 10/03/2020   BUN 19 10/03/2020   NA 140 10/03/2020   K 4.3 10/03/2020   CL 101 10/03/2020   CO2 26 10/03/2020   Lab Results  Component Value Date   ALT 11 05/29/2020   AST 13 05/29/2020   ALKPHOS 73 05/29/2020   BILITOT 0.3 05/29/2020   Lab Results  Component Value Date   HGBA1C 5.6 05/29/2020   HGBA1C 5.4 11/27/2019   HGBA1C 5.4 06/27/2019   HGBA1C 6.0 (H) 11/30/2018   Lab Results  Component Value Date   INSULIN 15.2  05/29/2020   INSULIN 10.3 11/27/2019   INSULIN 12.5 06/27/2019   INSULIN 14.5 11/30/2018   Lab Results  Component Value Date   TSH 4.040 10/03/2020   Lab Results  Component Value Date   CHOL 261 (H) 05/29/2020   HDL 49 05/29/2020   LDLCALC 175 (H) 05/29/2020   TRIG 197 (H) 05/29/2020   CHOLHDL 4.4 11/30/2018   Lab Results  Component Value Date   WBC 9.8 11/30/2018   HGB 13.6 11/30/2018   HCT 39.1 11/30/2018   MCV 87 11/30/2018   PLT 303 11/30/2018   No results found for: IRON, TIBC, FERRITIN  Attestation Statements:   Reviewed by clinician on day of visit: allergies, medications, problem list, medical history, surgical history, family history, social history, and previous encounter notes.   I, Trixie Dredge, am acting as transcriptionist for Dennard Nip, MD.  I have reviewed the above documentation for accuracy and completeness, and I agree with the above. -  Dennard Nip, MD

## 2020-10-30 ENCOUNTER — Ambulatory Visit (INDEPENDENT_AMBULATORY_CARE_PROVIDER_SITE_OTHER): Payer: 59 | Admitting: Family Medicine

## 2020-10-30 ENCOUNTER — Other Ambulatory Visit: Payer: Self-pay | Admitting: Family Medicine

## 2020-10-30 ENCOUNTER — Encounter: Payer: Self-pay | Admitting: Family Medicine

## 2020-10-30 ENCOUNTER — Other Ambulatory Visit: Payer: Self-pay

## 2020-10-30 ENCOUNTER — Encounter (INDEPENDENT_AMBULATORY_CARE_PROVIDER_SITE_OTHER): Payer: Self-pay | Admitting: Family Medicine

## 2020-10-30 VITALS — BP 110/73 | HR 83 | Temp 97.9°F | Ht 63.0 in | Wt 206.0 lb

## 2020-10-30 DIAGNOSIS — I10 Essential (primary) hypertension: Secondary | ICD-10-CM

## 2020-10-30 DIAGNOSIS — Z6836 Body mass index (BMI) 36.0-36.9, adult: Secondary | ICD-10-CM | POA: Diagnosis not present

## 2020-10-30 MED ORDER — FUROSEMIDE 20 MG PO TABS
40.0000 mg | ORAL_TABLET | Freq: Every day | ORAL | 2 refills | Status: DC | PRN
Start: 2020-10-30 — End: 2021-02-06

## 2020-10-30 MED FILL — FUROSEMIDE 20 MG TABS: 20 | 30 days supply | Qty: 60 | Fill #0

## 2020-10-30 NOTE — Telephone Encounter (Signed)
Ok to refill??  Last office visit 07/02/2020.  Last refill 09/12/2020.

## 2020-10-31 ENCOUNTER — Other Ambulatory Visit: Payer: Self-pay | Admitting: Family Medicine

## 2020-10-31 MED ORDER — AMPHETAMINE-DEXTROAMPHET ER 25 MG PO CP24
25.0000 mg | ORAL_CAPSULE | ORAL | 0 refills | Status: DC
Start: 2020-10-31 — End: 2020-12-19

## 2020-10-31 MED FILL — ADDERALL XR 25 MG CAPSULE: 25 | 30 days supply | Qty: 30 | Fill #0

## 2020-11-04 NOTE — Progress Notes (Signed)
Chief Complaint:   OBESITY [name] is here to discuss her progress with her obesity treatment plan along with follow-up of her obesity related diagnoses. [name] is on the Category 3 Plan and states she is following her eating plan approximately 85-90% of the time. [name] states she is walking for 20-25 minutes 4-5 times per week.  Today's visit was #: 58 Starting weight: 235 lbs Starting date: 11/30/2018 Today's weight: 206 lbs Today's date: 10/30/2020 Total lbs lost to date: 29 Total lbs lost since last in-office visit: 2  Interim History: [name] continues to do well with weight loss even after Thanksgiving. She notes having less cravings. She thinks she will be able to avoid weight gain over Christmas. She is tolerating Saxenda at 0.6 mg and she notes decreased polyphagia.  Subjective:   1. Essential hypertension [name]'s blood pressure is well controlled on her medications. She denies signs of hypotension. She is doing well on Saxenda which can also decrease blood pressure.  Assessment/Plan:   1. Essential hypertension [name] will continue healthy weight loss, diet, and exercise to improve blood pressure control. She will continue to monitor for signs of hypotension as she continues her lifestyle modifications.  2. Class 2 severe obesity with serious comorbidity and body mass index (BMI) of 36.0 to 36.9 in adult, unspecified obesity type (HCC) [name] is currently in the action stage of change. As such, her goal is to continue with weight loss efforts. She has agreed to the Category 3 Plan.   We discussed various medication options to help [name] with her weight loss efforts and we both agreed to continues Saxenda at 0.6 mg, and will continue to follow closely.  Exercise goals: As is.  Behavioral modification strategies: meal planning and cooking strategies.  [name] has agreed to follow-up with our clinic in 4 weeks. She was informed of the importance of frequent  follow-up visits to maximize her success with intensive lifestyle modifications for her multiple health conditions.   Objective:   Blood pressure 110/73, pulse 83, temperature 97.9 F (36.6 C), height 5\' 3"  (1.6 m), weight 206 lb (93.4 kg), SpO2 99 %. Body mass index is 36.49 kg/m.  General: Cooperative, alert, well developed, in no acute distress. HEENT: Conjunctivae and lids unremarkable. Cardiovascular: Regular rhythm.  Lungs: Normal work of breathing. Neurologic: No focal deficits.   Lab Results  Component Value Date   CREATININE 0.66 10/03/2020   BUN 19 10/03/2020   NA 140 10/03/2020   K 4.3 10/03/2020   CL 101 10/03/2020   CO2 26 10/03/2020   Lab Results  Component Value Date   ALT 11 05/29/2020   AST 13 05/29/2020   ALKPHOS 73 05/29/2020   BILITOT 0.3 05/29/2020   Lab Results  Component Value Date   HGBA1C 5.6 05/29/2020   HGBA1C 5.4 11/27/2019   HGBA1C 5.4 06/27/2019   HGBA1C 6.0 (H) 11/30/2018   Lab Results  Component Value Date   INSULIN 15.2 05/29/2020   INSULIN 10.3 11/27/2019   INSULIN 12.5 06/27/2019   INSULIN 14.5 11/30/2018   Lab Results  Component Value Date   TSH 4.040 10/03/2020   Lab Results  Component Value Date   CHOL 261 (H) 05/29/2020   HDL 49 05/29/2020   LDLCALC 175 (H) 05/29/2020   TRIG 197 (H) 05/29/2020   CHOLHDL 4.4 11/30/2018   Lab Results  Component Value Date   WBC 9.8 11/30/2018   HGB 13.6 11/30/2018   HCT 39.1 11/30/2018   MCV 87  11/30/2018   PLT 303 11/30/2018   No results found for: IRON, TIBC, FERRITIN  Attestation Statements:   Reviewed by clinician on day of visit: allergies, medications, problem list, medical history, surgical history, family history, social history, and previous encounter notes.  Time spent on visit including pre-visit chart review and post-visit care and charting was 22 minutes.    I, Trixie Dredge, am acting as transcriptionist for Dennard Nip, MD.  I have reviewed the above  documentation for accuracy and completeness, and I agree with the above. -  Dennard Nip, MD

## 2020-11-20 ENCOUNTER — Ambulatory Visit (INDEPENDENT_AMBULATORY_CARE_PROVIDER_SITE_OTHER): Payer: 59 | Admitting: Family Medicine

## 2020-11-20 ENCOUNTER — Other Ambulatory Visit: Payer: Self-pay

## 2020-11-20 ENCOUNTER — Other Ambulatory Visit (INDEPENDENT_AMBULATORY_CARE_PROVIDER_SITE_OTHER): Payer: Self-pay | Admitting: Family Medicine

## 2020-11-20 ENCOUNTER — Encounter (INDEPENDENT_AMBULATORY_CARE_PROVIDER_SITE_OTHER): Payer: Self-pay | Admitting: Family Medicine

## 2020-11-20 VITALS — BP 109/74 | HR 83 | Temp 97.5°F | Ht 63.0 in | Wt 208.0 lb

## 2020-11-20 DIAGNOSIS — R7303 Prediabetes: Secondary | ICD-10-CM | POA: Diagnosis not present

## 2020-11-20 DIAGNOSIS — Z9189 Other specified personal risk factors, not elsewhere classified: Secondary | ICD-10-CM

## 2020-11-20 DIAGNOSIS — E559 Vitamin D deficiency, unspecified: Secondary | ICD-10-CM

## 2020-11-20 DIAGNOSIS — Z6836 Body mass index (BMI) 36.0-36.9, adult: Secondary | ICD-10-CM

## 2020-11-20 DIAGNOSIS — I10 Essential (primary) hypertension: Secondary | ICD-10-CM

## 2020-11-20 MED ORDER — VITAMIN D (ERGOCALCIFEROL) 1.25 MG (50000 UNIT) PO CAPS: 50000.0000 [IU] | ORAL_CAPSULE | ORAL | 0 refills | Status: DC

## 2020-11-20 MED ORDER — METFORMIN HCL 500 MG PO TABS: 500.0000 mg | ORAL_TABLET | Freq: Three times a day (TID) | ORAL | 0 refills | Status: DC

## 2020-11-20 MED FILL — VIT D2 1.25 MG (50,000 UNIT: 1.25 MG | 28 days supply | Qty: 4 | Fill #0

## 2020-11-20 MED FILL — METFORMIN HCL 500 MG TABS: 500 | 30 days supply | Qty: 90 | Fill #0

## 2020-11-21 NOTE — Progress Notes (Signed)
Chief Complaint:   OBESITY [name] is here to discuss her progress with her obesity treatment plan along with follow-up of her obesity related diagnoses. [name] is on the Category 3 Plan and states she is following her eating plan approximately 90% of the time. [name] states she is doing 0 minutes 0 times per week.  Today's visit was #: 82 Starting weight: 235 lbs Starting date: 11/30/2018 Today's weight: 208 lbs Today's date: 11/20/2020 Total lbs lost to date: 27 Total lbs lost since last in-office visit: 0  Interim History: [name] did well minimizing holiday weight gain. She noted a lot more temptations including high carbohydrate and calorie food gifts that she is still finishing. She struggles with following her plan at dinner due to family sabotage.  Subjective:   1. Essential hypertension [name]'s blood pressure is well controlled. She notes feeling lightheaded since her new hearing aids, but this resolves when she takes them out.  2. Vitamin D deficiency [name] is stable on Vit D, and she denies nausea or vomiting.  3. Pre-diabetes [name] is doing well overall with diet and metformin. She notes decreased polyphagia.  4. At risk for diabetes mellitus [name] is at higher than average risk for developing diabetes due to obesity.   Assessment/Plan:   1. Essential hypertension [name] is working on healthy weight loss and exercise to improve blood pressure control. We will watch for signs of hypotension as she continues her lifestyle modifications. [name] is to increase her water intake and see if that will help, and she may need to discuss with NET or her Audiologist.   2. Vitamin D deficiency Low Vitamin D level contributes to fatigue and are associated with obesity, breast, and colon cancer. We will refill prescription Vitamin D for 1 month. [name] will follow-up for routine testing of Vitamin D, at least 2-3 times per year to avoid over-replacement.  - Vitamin  D, Ergocalciferol, (DRISDOL) 1.25 MG (50000 UNIT) CAPS capsule; Take 1 capsule (50,000 Units total) by mouth every 7 (seven) days.  Dispense: 4 capsule; Refill: 0  3. Pre-diabetes [name] will continue to work on weight loss, exercise, and decreasing simple carbohydrates to help decrease the risk of diabetes. We will refill metformin for 1 month.  - metFORMIN (GLUCOPHAGE) 500 MG tablet; Take 1 tablet (500 mg total) by mouth in the morning, at noon, and at bedtime.  Dispense: 90 tablet; Refill: 0  4. At risk for diabetes mellitus [name] was given approximately 15 minutes of diabetes education and counseling today. We discussed intensive lifestyle modifications today with an emphasis on weight loss as well as increasing exercise and decreasing simple carbohydrates in her diet. We also reviewed medication options with an emphasis on risk versus benefit of those discussed.   Repetitive spaced learning was employed today to elicit superior memory formation and behavioral change.  5. Class 2 severe obesity with serious comorbidity and body mass index (BMI) of 36.0 to 36.9 in adult, unspecified obesity type (HCC) [name] is currently in the action stage of change. As such, her goal is to continue with weight loss efforts. She has agreed to the Category 3 Plan and keeping a food journal and adhering to recommended goals of 400-600 calories and 40+ grams of protein at supper daily.   Behavioral modification strategies: meal planning and cooking strategies and keeping a strict food journal.  [name] has agreed to follow-up with our clinic in 3 weeks. She was informed of the importance of frequent follow-up visits to  maximize her success with intensive lifestyle modifications for her multiple health conditions.   Objective:   Blood pressure 109/74, pulse 83, temperature (!) 97.5 F (36.4 C), height 5\' 3"  (1.6 m), weight 208 lb (94.3 kg), SpO2 99 %. Body mass index is 36.85 kg/m.  General:  Cooperative, alert, well developed, in no acute distress. HEENT: Conjunctivae and lids unremarkable. Cardiovascular: Regular rhythm.  Lungs: Normal work of breathing. Neurologic: No focal deficits.   Lab Results  Component Value Date   CREATININE 0.66 10/03/2020   BUN 19 10/03/2020   NA 140 10/03/2020   K 4.3 10/03/2020   CL 101 10/03/2020   CO2 26 10/03/2020   Lab Results  Component Value Date   ALT 11 05/29/2020   AST 13 05/29/2020   ALKPHOS 73 05/29/2020   BILITOT 0.3 05/29/2020   Lab Results  Component Value Date   HGBA1C 5.6 05/29/2020   HGBA1C 5.4 11/27/2019   HGBA1C 5.4 06/27/2019   HGBA1C 6.0 (H) 11/30/2018   Lab Results  Component Value Date   INSULIN 15.2 05/29/2020   INSULIN 10.3 11/27/2019   INSULIN 12.5 06/27/2019   INSULIN 14.5 11/30/2018   Lab Results  Component Value Date   TSH 4.040 10/03/2020   Lab Results  Component Value Date   CHOL 261 (H) 05/29/2020   HDL 49 05/29/2020   LDLCALC 175 (H) 05/29/2020   TRIG 197 (H) 05/29/2020   CHOLHDL 4.4 11/30/2018   Lab Results  Component Value Date   WBC 9.8 11/30/2018   HGB 13.6 11/30/2018   HCT 39.1 11/30/2018   MCV 87 11/30/2018   PLT 303 11/30/2018   No results found for: IRON, TIBC, FERRITIN  Attestation Statements:   Reviewed by clinician on day of visit: allergies, medications, problem list, medical history, surgical history, family history, social history, and previous encounter notes.   I, 12/02/2018, am acting as transcriptionist for Burt Knack, MD.  I have reviewed the above documentation for accuracy and completeness, and I agree with the above. -  Quillian Quince, MD

## 2020-11-25 ENCOUNTER — Ambulatory Visit (INDEPENDENT_AMBULATORY_CARE_PROVIDER_SITE_OTHER): Payer: 59 | Admitting: Family Medicine

## 2020-12-10 ENCOUNTER — Ambulatory Visit: Payer: 59 | Admitting: Cardiovascular Disease

## 2020-12-11 ENCOUNTER — Telehealth (INDEPENDENT_AMBULATORY_CARE_PROVIDER_SITE_OTHER): Payer: 59 | Admitting: Family Medicine

## 2020-12-11 ENCOUNTER — Other Ambulatory Visit (INDEPENDENT_AMBULATORY_CARE_PROVIDER_SITE_OTHER): Payer: Self-pay | Admitting: Family Medicine

## 2020-12-11 ENCOUNTER — Other Ambulatory Visit: Payer: Self-pay

## 2020-12-11 ENCOUNTER — Encounter (INDEPENDENT_AMBULATORY_CARE_PROVIDER_SITE_OTHER): Payer: Self-pay | Admitting: Family Medicine

## 2020-12-11 DIAGNOSIS — R7303 Prediabetes: Secondary | ICD-10-CM | POA: Diagnosis not present

## 2020-12-11 DIAGNOSIS — Z6836 Body mass index (BMI) 36.0-36.9, adult: Secondary | ICD-10-CM | POA: Diagnosis not present

## 2020-12-11 DIAGNOSIS — U071 COVID-19: Secondary | ICD-10-CM

## 2020-12-11 DIAGNOSIS — E559 Vitamin D deficiency, unspecified: Secondary | ICD-10-CM

## 2020-12-11 DIAGNOSIS — Z9189 Other specified personal risk factors, not elsewhere classified: Secondary | ICD-10-CM | POA: Diagnosis not present

## 2020-12-11 MED ORDER — METFORMIN HCL 500 MG PO TABS: 500.0000 mg | ORAL_TABLET | Freq: Three times a day (TID) | ORAL | 0 refills | Status: DC

## 2020-12-11 MED ORDER — VITAMIN D (ERGOCALCIFEROL) 1.25 MG (50000 UNIT) PO CAPS: 50000.0000 [IU] | ORAL_CAPSULE | ORAL | 0 refills | Status: DC

## 2020-12-11 NOTE — Progress Notes (Signed)
TeleHealth Visit:  Due to the COVID-19 pandemic, this visit was completed with telemedicine (audio/video) technology to reduce patient and provider exposure as well as to preserve personal protective equipment.   [name] has verbally consented to this TeleHealth visit. The patient is located at home, the provider is located at the [facility] office. The participants in this visit include the listed provider and patient. The visit was conducted today via MyChart video.   Chief Complaint: OBESITY [name] is here to discuss her progress with her obesity treatment plan along with follow-up of her obesity related diagnoses. [name] is on the Category 3 Plan and keeping a food journal and adhering to recommended goals of 400-600 calories and 40+ grams of protein at supper daily and states she is following her eating plan approximately 90% of the time. [name] states she is doing 0 minutes 0 times per week.  Today's visit was #: 63 Starting weight: 235 lbs Starting date: 11/30/2018  Interim History: [name] is sick at home recovering from [place]. She thinks she has maintained her weight but of course she hasn't been able to follow her plan while sick. She was doing well previously and she is not having problems with Saxenda.  Subjective:   1. Pre-diabetes [name] has been doing well overall with decreased simple carbohydrates. She has not been taking metformin while sick however due to nausea.  2. Vitamin D deficiency [name] is stable on Vit D, and she requests a refill today.  3. COVID-19 virus infection [name] is COVID19 positive and she is isolating at home. Her husband was positive last week. She notes significant fatigue, headache, and congestion but no shortness of breath.  4. At risk for dehydration [name] is at risk for dehydration due to inadequate water intake.  Assessment/Plan:   1. Pre-diabetes [name] will continue to work on weight loss, exercise, and  decreasing simple carbohydrates to help decrease the risk of diabetes. We will refill metformin for 1 month. [place] is ok to hold metformin until she is feeling better.   - metFORMIN (GLUCOPHAGE) 500 MG tablet; Take 1 tablet (500 mg total) by mouth in the morning, at noon, and at bedtime.  Dispense: 90 tablet; Refill: 0  2. Vitamin D deficiency Low Vitamin D level contributes to fatigue and are associated with obesity, breast, and colon cancer. We will refill prescription Vitamin D for 1 month. [name] will follow-up for routine testing of Vitamin D, at least 2-3 times per year to avoid over-replacement.  - Vitamin D, Ergocalciferol, (DRISDOL) 1.25 MG (50000 UNIT) CAPS capsule; Take 1 capsule (50,000 Units total) by mouth every 7 (seven) days.  Dispense: 4 capsule; Refill: 0  3. COVID-19 virus infection [name] is to continue to isolate for at least 5 days after COVID19 positive. She was advised to rest and continue to hydrate and contact her primary care physician or myself if her symptoms worsen. Orders and follow up as documented in patient record.  4. At risk for dehydration [name] was given approximately 15 minutes dehydration prevention counseling today. [name] is at risk for dehydration due to weight loss and current medication(s). She was encouraged to hydrate and monitor fluid status to avoid dehydration as well as weight loss plateaus.   5. Class 2 severe obesity with serious comorbidity and body mass index (BMI) of 36.0 to 36.9 in adult, unspecified obesity type (HCC) [name] is currently in the action stage of change. As such, her goal is to continue with weight loss  efforts. She has agreed to the Category 3 Plan and keeping a food journal and adhering to recommended goals of 400-600 calories and 40+ grams of protein at supper daily.   We discussed various medication options to help [name] with her weight loss efforts and we both agreed to continue Saxenda as is. She  Is to get back  to her eating plan as tolerated when she is feeling better.  Behavioral modification strategies: increasing lean protein intake and increasing water intake.  [name] has agreed to follow-up with our clinic in 3 weeks. She was informed of the importance of frequent follow-up visits to maximize her success with intensive lifestyle modifications for her multiple health conditions.  Objective:   VITALS: Per patient if applicable, see vitals. GENERAL: Alert and in no acute distress. CARDIOPULMONARY: No increased WOB. Speaking in clear sentences.  PSYCH: Pleasant and cooperative. Speech normal rate and rhythm. Affect is appropriate. Insight and judgement are appropriate. Attention is focused, linear, and appropriate.  NEURO: Oriented as arrived to appointment on time with no prompting.   Lab Results  Component Value Date   CREATININE 0.66 10/03/2020   BUN 19 10/03/2020   NA 140 10/03/2020   K 4.3 10/03/2020   CL 101 10/03/2020   CO2 26 10/03/2020   Lab Results  Component Value Date   ALT 11 05/29/2020   AST 13 05/29/2020   ALKPHOS 73 05/29/2020   BILITOT 0.3 05/29/2020   Lab Results  Component Value Date   HGBA1C 5.6 05/29/2020   HGBA1C 5.4 11/27/2019   HGBA1C 5.4 06/27/2019   HGBA1C 6.0 (H) 11/30/2018   Lab Results  Component Value Date   INSULIN 15.2 05/29/2020   INSULIN 10.3 11/27/2019   INSULIN 12.5 06/27/2019   INSULIN 14.5 11/30/2018   Lab Results  Component Value Date   TSH 4.040 10/03/2020   Lab Results  Component Value Date   CHOL 261 (H) 05/29/2020   HDL 49 05/29/2020   LDLCALC 175 (H) 05/29/2020   TRIG 197 (H) 05/29/2020   CHOLHDL 4.4 11/30/2018   Lab Results  Component Value Date   WBC 9.8 11/30/2018   HGB 13.6 11/30/2018   HCT 39.1 11/30/2018   MCV 87 11/30/2018   PLT 303 11/30/2018   No results found for: IRON, TIBC, FERRITIN  Attestation Statements:   Reviewed by clinician on day of visit: allergies, medications, problem list, medical  history, surgical history, family history, social history, and previous encounter notes.   I, Trixie Dredge, am acting as transcriptionist for Dennard Nip, MD.  I have reviewed the above documentation for accuracy and completeness, and I agree with the above. - Dennard Nip, MD

## 2020-12-12 MED FILL — VIT D2 1.25 MG (50,000 UNIT: 1.25 MG | 28 days supply | Qty: 4 | Fill #0

## 2020-12-16 MED FILL — METFORMIN HCL 500 MG TABS: 500 | 30 days supply | Qty: 90 | Fill #0

## 2020-12-19 ENCOUNTER — Other Ambulatory Visit: Payer: Self-pay | Admitting: Family Medicine

## 2020-12-19 MED ORDER — AMPHETAMINE-DEXTROAMPHET ER 25 MG PO CP24: 25.0000 mg | ORAL_CAPSULE | ORAL | 0 refills | Status: DC

## 2020-12-19 MED FILL — ADDERALL XR 25 MG CAPSULE: 25 | 30 days supply | Qty: 30 | Fill #0

## 2021-01-02 ENCOUNTER — Other Ambulatory Visit (HOSPITAL_COMMUNITY): Payer: Self-pay | Admitting: Dermatology

## 2021-01-02 DIAGNOSIS — L814 Other melanin hyperpigmentation: Secondary | ICD-10-CM | POA: Diagnosis not present

## 2021-01-02 DIAGNOSIS — L218 Other seborrheic dermatitis: Secondary | ICD-10-CM | POA: Diagnosis not present

## 2021-01-02 DIAGNOSIS — D171 Benign lipomatous neoplasm of skin and subcutaneous tissue of trunk: Secondary | ICD-10-CM | POA: Diagnosis not present

## 2021-01-02 DIAGNOSIS — D1801 Hemangioma of skin and subcutaneous tissue: Secondary | ICD-10-CM | POA: Diagnosis not present

## 2021-01-02 DIAGNOSIS — D225 Melanocytic nevi of trunk: Secondary | ICD-10-CM | POA: Diagnosis not present

## 2021-01-02 DIAGNOSIS — L820 Inflamed seborrheic keratosis: Secondary | ICD-10-CM | POA: Diagnosis not present

## 2021-01-02 DIAGNOSIS — L821 Other seborrheic keratosis: Secondary | ICD-10-CM | POA: Diagnosis not present

## 2021-01-02 MED FILL — KETOCONAZOLE 2 % SHAM: 2 | 20 days supply | Qty: 120 | Fill #0

## 2021-01-02 MED FILL — CLOBETASOL 0.05% SOLUTION: 0.05 | 30 days supply | Qty: 50 | Fill #0

## 2021-01-14 ENCOUNTER — Telehealth (INDEPENDENT_AMBULATORY_CARE_PROVIDER_SITE_OTHER): Payer: Self-pay

## 2021-01-14 NOTE — Telephone Encounter (Signed)
PA initiated via [email or web address] for Saxenda 18mg /36mL.  Key: [id number] Saxenda 18MG [place] Mediate pen-injectors   Form: MedImpact ePA Form 2017 NCPDP Determination: Wait for Determination Please wait for MedImpact 2017 to return a determination.

## 2021-01-21 NOTE — Telephone Encounter (Signed)
PA has been approved for Saxenda. Approval is good for 4 fills from 01/16/21 through 05/17/2021 as long as pt is enrolled as member of current health plan.

## 2021-01-27 ENCOUNTER — Encounter (INDEPENDENT_AMBULATORY_CARE_PROVIDER_SITE_OTHER): Payer: Self-pay | Admitting: Family Medicine

## 2021-01-27 DIAGNOSIS — H5213 Myopia, bilateral: Secondary | ICD-10-CM | POA: Diagnosis not present

## 2021-02-06 ENCOUNTER — Other Ambulatory Visit: Payer: Self-pay | Admitting: Family Medicine

## 2021-02-06 ENCOUNTER — Encounter (INDEPENDENT_AMBULATORY_CARE_PROVIDER_SITE_OTHER): Payer: Self-pay | Admitting: Family Medicine

## 2021-02-06 ENCOUNTER — Encounter: Payer: Self-pay | Admitting: Family Medicine

## 2021-02-06 ENCOUNTER — Other Ambulatory Visit (INDEPENDENT_AMBULATORY_CARE_PROVIDER_SITE_OTHER): Payer: Self-pay | Admitting: Family Medicine

## 2021-02-06 ENCOUNTER — Other Ambulatory Visit: Payer: Self-pay

## 2021-02-06 ENCOUNTER — Ambulatory Visit (INDEPENDENT_AMBULATORY_CARE_PROVIDER_SITE_OTHER): Payer: 59 | Admitting: Family Medicine

## 2021-02-06 VITALS — BP 105/71 | HR 84 | Temp 97.7°F | Ht 63.0 in | Wt 209.0 lb

## 2021-02-06 DIAGNOSIS — E559 Vitamin D deficiency, unspecified: Secondary | ICD-10-CM

## 2021-02-06 DIAGNOSIS — Z9189 Other specified personal risk factors, not elsewhere classified: Secondary | ICD-10-CM | POA: Diagnosis not present

## 2021-02-06 DIAGNOSIS — Z6841 Body Mass Index (BMI) 40.0 and over, adult: Secondary | ICD-10-CM | POA: Diagnosis not present

## 2021-02-06 DIAGNOSIS — R7303 Prediabetes: Secondary | ICD-10-CM

## 2021-02-06 DIAGNOSIS — E66813 Obesity, class 3: Secondary | ICD-10-CM

## 2021-02-06 MED ORDER — FUROSEMIDE 20 MG PO TABS: 40.0000 mg | ORAL_TABLET | Freq: Every day | ORAL | 2 refills | Status: DC | PRN

## 2021-02-06 MED ORDER — VITAMIN D (ERGOCALCIFEROL) 1.25 MG (50000 UNIT) PO CAPS: 50000.0000 [IU] | ORAL_CAPSULE | ORAL | 0 refills | Status: DC

## 2021-02-06 MED ORDER — BD PEN NEEDLE NANO 2ND GEN 32G X 4 MM MISC: 0 refills | Status: DC

## 2021-02-06 MED ORDER — SAXENDA 18 MG/3ML ~~LOC~~ SOPN
3.0000 mg | PEN_INJECTOR | Freq: Every day | SUBCUTANEOUS | 0 refills | Status: DC
Start: 2021-02-06 — End: 2021-02-06

## 2021-02-06 MED FILL — FUROSEMIDE 20 MG TABS: 20 | 30 days supply | Qty: 60 | Fill #0

## 2021-02-06 NOTE — Telephone Encounter (Signed)
Ok to refill??  Last office visit 12/19/2020.  Last refill 07/02/2020.

## 2021-02-07 ENCOUNTER — Other Ambulatory Visit: Payer: Self-pay | Admitting: Family Medicine

## 2021-02-07 ENCOUNTER — Telehealth: Payer: Self-pay

## 2021-02-07 MED ORDER — NITROFURANTOIN MONOHYD MACRO 100 MG PO CAPS: 100.0000 mg | ORAL_CAPSULE | Freq: Two times a day (BID) | ORAL | 0 refills | Status: DC

## 2021-02-07 MED ORDER — FLUCONAZOLE 150 MG PO TABS: 150.0000 mg | ORAL_TABLET | ORAL | 0 refills | Status: DC

## 2021-02-07 MED ORDER — AMPHETAMINE-DEXTROAMPHET ER 25 MG PO CP24: 25.0000 mg | ORAL_CAPSULE | ORAL | 0 refills | Status: DC

## 2021-02-07 MED FILL — ADDERALL XR 25 MG CAPSULE: 25 | 30 days supply | Qty: 30 | Fill #0

## 2021-02-07 MED FILL — UNIFINE PENTIPS 32GX5/32: 32G X 4 MM | 90 days supply | Qty: 100 | Fill #0

## 2021-02-07 MED FILL — FLUCONAZOLE 150 MG TABS: 150 | 15 days supply | Qty: 5 | Fill #0

## 2021-02-07 MED FILL — SAXENDA 18 MG/3 ML PEN: 18 | 30 days supply | Qty: 15 | Fill #0

## 2021-02-07 MED FILL — NITROFURANTOIN MONO-MCR 100: 100 | 7 days supply | Qty: 14 | Fill #0

## 2021-02-07 MED FILL — VIT D2 1.25 MG (50,000 UNIT: 1.25 MG | 28 days supply | Qty: 4 | Fill #0

## 2021-02-07 NOTE — Telephone Encounter (Signed)
Patient complains of cloudy urine and urinary urgency - started just today. She is requesting something for possible UTI. Last time, she was given Macrobid and Diflucan. [place]. Please advise.

## 2021-02-07 NOTE — Telephone Encounter (Signed)
Sent!

## 2021-02-07 NOTE — Addendum Note (Signed)
Addended by: Hortencia Pilar on: 02/07/2021 12:57 PM   Modules accepted: Orders

## 2021-02-07 NOTE — Telephone Encounter (Signed)
Patient has been advised

## 2021-02-09 ENCOUNTER — Encounter: Payer: Self-pay | Admitting: Cardiovascular Disease

## 2021-02-09 NOTE — Progress Notes (Signed)
Cardiology Office Note   Date:  02/10/2021   ID:  Alison, Jenkins [birth year], MRN [id number]  PCP:  Susy Frizzle, MD  Cardiologist:   Mertie Moores, MD   Chief Complaint  Patient presents with  . Congestive Heart Failure   Problem List 1.  Diastolic dysfunction 2. Hypertension 3. Obesity 4. Palpitations  5. Obstructive sleep apnea - has not been using her CPAP, Needs to see a pulmonologist to get another mask.    [name] is a 50 y.o. female who presents for  Further evaluation of her shortness breath and palpitations. Has had palpitations for several months.   Seem to be worse at night , while watching TV.  Also has dyspnea - primarily at rest.   Not related to lying down. Has DOE Works out at [employer] , has been going for the past month. Has had shortness of breath since getting back to the gym.    Was given lasix 20 as needed - takes it once every 1-2 months.   Seems to need to take the lasix after she has eaten more salty foods than she should .    She knows that she should limit salt intake but has not made any real effort.   Has lost 4 lbs since Jan. 1.  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    Husband is a Hotel manager.   2 boys at home,  Age 58 and 51.   Sept. 7, 2017: [name] is seen for a follow up visit This past Saturday ,  Has had some hot flashes / sweats  HR has been very high for the past several weeks.  (is on adderall)  No diarrhea. No weight loss.   Has not been using her CPAP mask - ends up taking the CPAP mask off after 15 minutes.   Oct. 9, 2017:  [name] is seen back for follow up of Her chronic diastolic congestive heart failure. She was seen in Sept.  We stopped the Bystolic and started Metoprolol 50 BID  Has been checking BP , Now works at [employer].   Mar 17, 2018: [name] is seen back today for further evaluation of her sinus tachycardia and diastolic heart  failure. Had partial left knee replacement [name] )  Occasional episodes of shortness of breath.   Is walking at lunch - gets a little short of breath  Is making progress with her walking   Wt today is 228 lbs.   December 01, 2019:  [name] is seen back today for follow-up of her palpitations and chronic diastolic congestive heart failure. Wt. Today is 203 lbs ( down 25 lbs from last visit)  Rare CP.   Some palpitations  Works at [employer] ( now owned by [employer])  Walking regularly ,   Working on diet .   Is having a new CPAP mask made.    February 10, 2021:  [name] is seen today for follow p of her palpitations and chronic diastolic CHF Has OSA Had covid in jan. Wt today is 211  Up 8 lbs from last yer  Is  exercising  Event monitor showed :   Sinus rhythm with occasional PVCs.  Diastolic BP is elevated.   Sees Dr. Leafy Ro at [facility]     Past Medical History:  Diagnosis Date  . Anxiety   . Asthma   . Back pain   .  Chest pain   . Chronic CHF ([place])   . Dyspnea   . Echocardiogram 10/25/2015   EF 60-65, normal wall motion, grade 1 diastolic dysfunction, GLS -19.1%  . Edema of left lower extremity   . Fracture dislocation of elbow joint 01/15/2013   right  . GERD (gastroesophageal reflux disease)    Omeprazole as needed, none in 6 mos.  . Hyperlipidemia   . Hypertension   . Joint pain   . Obesity   . Palpitations   . Prediabetes   . R/L Cardiac catheterization 08/18/2018   Normal coronary arteries, mean RA 6, mean PA 17, PCWP 10  . Seborrheic dermatitis   . Sleep apnea    uses CPAP "most nights"    Past Surgical History:  Procedure Laterality Date  . [place]; 10/10/2000   x 2  . KNEE ARTHROSCOPY Left 1991   also in 2019  . LAPAROSCOPIC ASSISTED VAGINAL HYSTERECTOMY  11/02/2006  . LAPAROSCOPIC LYSIS OF ADHESIONS  03/06/2005  . RADIAL HEAD ARTHROPLASTY Right 01/17/2013   Procedure: RIGHT RADIAL HEAD ARTHROPLASTY  OPEN REDUCTION INTERNAL FIXATION CORONOID ;  Surgeon: Tennis Must, MD;  Location: [place];  Service: Orthopedics;  Laterality: Right;  . RIGHT/LEFT HEART CATH AND CORONARY ANGIOGRAPHY N/A 08/18/2018   Procedure: RIGHT/LEFT HEART CATH AND CORONARY ANGIOGRAPHY;  Surgeon: Nelva Bush, MD;  Location: [facility];  Service: Cardiovascular;  Laterality: N/A;  . TUBAL LIGATION  03/06/2005     Current Outpatient Medications  Medication Sig Dispense Refill  . albuterol (PROVENTIL HFA;VENTOLIN HFA) 108 (90 Base) MCG/ACT inhaler Inhale 2 puffs into the lungs every 6 (six) hours as needed for wheezing. 1 Inhaler 11  . ALPRAZolam (XANAX) 0.5 MG tablet Take 1 tablet (0.5 mg total) by mouth 3 (three) times daily as needed. 45 tablet 0  . amphetamine-dextroamphetamine (ADDERALL XR) 25 MG 24 hr capsule Take 1 capsule by mouth every morning. 30 capsule 0  . fluconazole (DIFLUCAN) 150 MG tablet Take 1 tablet (150 mg total) by mouth every 3 (three) days. 5 tablet 0  . furosemide (LASIX) 20 MG tablet Take 2 tablets (40 mg total) by mouth daily as needed (swelling). 60 tablet 2  . ibuprofen (ADVIL,MOTRIN) 200 MG tablet Take 400 mg by mouth 2 (two) times daily as needed for headache or moderate pain.    . Insulin Pen Needle (BD PEN NEEDLE NANO 2ND GEN) 32G X 4 MM MISC Use once daily to inject Saxenda. 100 each 0  . Liraglutide -Weight Management (SAXENDA) 18 MG/3ML SOPN Inject 3 mg into the skin daily. 15 mL 0  . metFORMIN (GLUCOPHAGE) 500 MG tablet Take 1 tablet (500 mg total) by mouth in the morning, at noon, and at bedtime. 90 tablet 0  . metoprolol tartrate (LOPRESSOR) 25 MG tablet Take 1 tablet (25 mg total) by mouth 2 (two) times daily. Please keep upcoming appt in January 2022 with Dr. Acie Fredrickson for future refills. Thank you 180 tablet 0  . nitrofurantoin, macrocrystal-monohydrate, (MACROBID) 100 MG capsule Take 1 capsule (100 mg total) by mouth 2 (two) times daily. 14 capsule 0  .  Vitamin D, Ergocalciferol, (DRISDOL) 1.25 MG (50000 UNIT) CAPS capsule Take 1 capsule (50,000 Units total) by mouth every 7 (seven) days. 4 capsule 0   No current facility-administered medications for this visit.    Allergies:   Adhesive [tape] and Latex    Social History:  The patient  reports that she has never smoked.  She has never used smokeless tobacco. She reports that she does not drink alcohol and does not use drugs.   Family History:  The patient's family history includes Diabetes in her maternal grandmother, mother, paternal grandmother, and sister; Heart disease in her maternal grandfather, maternal grandmother, maternal uncle, and mother; Heart disease (age of onset: 1) in her father; Hypertension in her mother; Liver disease in her paternal aunt; Obesity in her mother; Ovarian cancer in her maternal grandmother; Sudden death in her father.    ROS:   Noted in current history, otherwise review of systems is negative.  Physical Exam: Blood pressure 122/86, pulse 92, height 5\' 3"  (1.6 m), weight 211 lb 12.8 oz (96.1 kg), SpO2 98 %.  GEN:  Well nourished, well developed in no acute distress HEENT: Normal NECK: No JVD; No carotid bruits LYMPHATICS: No lymphadenopathy CARDIAC: RRR RESPIRATORY:  Clear to auscultation without rales, wheezing or rhonchi  ABDOMEN: Soft, non-tender, non-distended MUSCULOSKELETAL:  No edema; No deformity  SKIN: Warm and dry NEUROLOGIC:  Alert and oriented x 3   EKG:   February 10, 2021: Normal sinus rhythm at 92.  No ST or T wave changes.   Recent Labs: 05/29/2020: ALT 11 10/03/2020: BUN 19; Creatinine, Ser 0.66; Potassium 4.3; Sodium 140; TSH 4.040    Lipid Panel    Component Value Date/Time   CHOL 261 (H) 05/29/2020 1422   TRIG 197 (H) 05/29/2020 1422   HDL 49 05/29/2020 1422   CHOLHDL 4.4 11/30/2018 1047   CHOLHDL 4.2 12/24/2017 0818   VLDL 22 03/11/2015 0847   LDLCALC 175 (H) 05/29/2020 1422   LDLCALC 134 (H) 12/24/2017 0818       Wt Readings from Last 3 Encounters:  02/10/21 211 lb 12.8 oz (96.1 kg)  02/06/21 209 lb (94.8 kg)  11/20/20 208 lb (94.3 kg)      Other studies Reviewed: Additional studies/ records that were reviewed today include: . Review of the above records demonstrates:    ASSESSMENT AND PLAN:  1.   Palpitations-her event monitor revealed occasional premature ventricular contractions.  The palpitations seem to be under better control.  Continue metoprolol.    2.  Chronic diastolic congestive heart failure:  She is not having any leg swelling or shortness of breath.  I advised continued weight loss.  She still eats a fair amount of salt on occasions.  She eats hotdogs several times a month.  Ive advised her to avoid processed mets.   Current medicines are reviewed at length with the patient today.  The patient does not have concerns regarding medicines.  The following changes have been made:  no change  Labs/ tests ordered today include:   Orders Placed This Encounter  Procedures  . EKG 12-Lead     Mertie Moores, MD  02/10/2021 3:02 PM    [facility] [place], [place],   [address] Phone: [phone]; Fax: [phone]

## 2021-02-10 ENCOUNTER — Ambulatory Visit: Payer: 59 | Admitting: Cardiovascular Disease

## 2021-02-10 ENCOUNTER — Encounter: Payer: Self-pay | Admitting: Cardiovascular Disease

## 2021-02-10 ENCOUNTER — Other Ambulatory Visit: Payer: Self-pay

## 2021-02-10 VITALS — BP 122/86 | HR 92 | Ht 63.0 in | Wt 211.8 lb

## 2021-02-10 DIAGNOSIS — I493 Ventricular premature depolarization: Secondary | ICD-10-CM

## 2021-02-10 DIAGNOSIS — I5032 Chronic diastolic (congestive) heart failure: Secondary | ICD-10-CM

## 2021-02-10 NOTE — Patient Instructions (Signed)

## 2021-02-11 NOTE — Progress Notes (Signed)
Chief Complaint:   OBESITY [name] is here to discuss her progress with her obesity treatment plan along with follow-up of her obesity related diagnoses. [name] is on the Category 3 Plan and keeping a food journal and adhering to recommended goals of 400-600 calories and 40+ grams of protein at supper daily and states she is following her eating plan approximately 85-90% of the time. [name] states she is walking for 20-25 minutes 4 times per week.  Today's visit was #: 76 Starting weight: 235 lbs Starting date: 11/30/2018 Today's weight: 209 lbs Today's date: 02/06/2021 Total lbs lost to date: 26 Total lbs lost since last in-office visit: 0  Interim History: [name] is retaining some water weight today. She is still making a lot of healthy choices, but she is getting a bit bored with her options. She denies nausea or vomiting on Saxenda.  Subjective:   1. Vitamin D deficiency [name] is stable on Vit D, and she denies nausea or vomiting. She requests a refill today.  2. Pre-diabetes [name] is stable on her medications, and she is working on diet and exercise.  3. At risk for complication associated with hypotension [name] is at a higher than average risk of hypotension due to low blood pressure readings.  Assessment/Plan:   1. Vitamin D deficiency Low Vitamin D level contributes to fatigue and are associated with obesity, breast, and colon cancer. We will refill prescription Vitamin D for 1 month. [name] will follow-up for routine testing of Vitamin D, at least 2-3 times per year to avoid over-replacement.  - Vitamin D, Ergocalciferol, (DRISDOL) 1.25 MG (50000 UNIT) CAPS capsule; Take 1 capsule (50,000 Units total) by mouth every 7 (seven) days.  Dispense: 4 capsule; Refill: 0  2. Pre-diabetes [name] will continue metformin and GLP-1, and will continue to work on weight loss, exercise, and decreasing simple carbohydrates to help decrease the risk of diabetes.   3. At  risk for complication associated with hypotension [name] was given approximately 15 minutes of education and counseling today to help avoid hypotension. We discussed risks of hypotension with weight loss and signs of hypotension such as feeling lightheaded or unsteady.  Repetitive spaced learning was employed today to elicit superior memory formation and behavioral change.  4. Obesity with current BMI of 37.0 [name] is currently in the action stage of change. As such, her goal is to continue with weight loss efforts. She has agreed to keeping a food journal and adhering to recommended goals of 1200-1500 calories and 85+ grams of protein daily.   We discussed various medication options to help [name] with her weight loss efforts and we both agreed to continue Saxenda, and we will refill for 1 month, and we will refill pen needles #100 with no refills. [name] is ok to increase slowly to 1.2 mg.  - Liraglutide -Weight Management (SAXENDA) 18 MG/3ML SOPN; Inject 3 mg into the skin daily.  Dispense: 15 mL; Refill: 0 - Insulin Pen Needle (BD PEN NEEDLE NANO 2ND GEN) 32G X 4 MM MISC; Use once daily to inject Saxenda.  Dispense: 100 each; Refill: 0  Exercise goals: As is.  Behavioral modification strategies: increasing lean protein intake.  [name] has agreed to follow-up with our clinic in 3 to 4 weeks. She was informed of the importance of frequent follow-up visits to maximize her success with intensive lifestyle modifications for her multiple health conditions.   Objective:   Blood pressure 105/71, pulse 84, temperature 97.7 F (36.5 C), height  5\' 3"  (1.6 m), weight 209 lb (94.8 kg), SpO2 99 %. Body mass index is 37.02 kg/m.  General: Cooperative, alert, well developed, in no acute distress. HEENT: Conjunctivae and lids unremarkable. Cardiovascular: Regular rhythm.  Lungs: Normal work of breathing. Neurologic: No focal deficits.   Lab Results  Component Value Date   CREATININE 0.66  10/03/2020   BUN 19 10/03/2020   NA 140 10/03/2020   K 4.3 10/03/2020   CL 101 10/03/2020   CO2 26 10/03/2020   Lab Results  Component Value Date   ALT 11 05/29/2020   AST 13 05/29/2020   ALKPHOS 73 05/29/2020   BILITOT 0.3 05/29/2020   Lab Results  Component Value Date   HGBA1C 5.6 05/29/2020   HGBA1C 5.4 11/27/2019   HGBA1C 5.4 06/27/2019   HGBA1C 6.0 (H) 11/30/2018   Lab Results  Component Value Date   INSULIN 15.2 05/29/2020   INSULIN 10.3 11/27/2019   INSULIN 12.5 06/27/2019   INSULIN 14.5 11/30/2018   Lab Results  Component Value Date   TSH 4.040 10/03/2020   Lab Results  Component Value Date   CHOL 261 (H) 05/29/2020   HDL 49 05/29/2020   LDLCALC 175 (H) 05/29/2020   TRIG 197 (H) 05/29/2020   CHOLHDL 4.4 11/30/2018   Lab Results  Component Value Date   WBC 9.8 11/30/2018   HGB 13.6 11/30/2018   HCT 39.1 11/30/2018   MCV 87 11/30/2018   PLT 303 11/30/2018   No results found for: IRON, TIBC, FERRITIN  Attestation Statements:   Reviewed by clinician on day of visit: allergies, medications, problem list, medical history, surgical history, family history, social history, and previous encounter notes.   I, Trixie Dredge, am acting as transcriptionist for Dennard Nip, MD.  I have reviewed the above documentation for accuracy and completeness, and I agree with the above. -  Dennard Nip, MD

## 2021-02-21 ENCOUNTER — Other Ambulatory Visit (HOSPITAL_COMMUNITY): Payer: Self-pay

## 2021-02-21 MED FILL — Clobetasol Propionate Soln 0.05%: CUTANEOUS | 30 days supply | Qty: 50 | Fill #0 | Status: CN

## 2021-02-21 MED FILL — Ketoconazole Shampoo 2%: CUTANEOUS | 30 days supply | Qty: 120 | Fill #0 | Status: CN

## 2021-02-28 ENCOUNTER — Other Ambulatory Visit (HOSPITAL_COMMUNITY): Payer: Self-pay

## 2021-03-01 ENCOUNTER — Other Ambulatory Visit (HOSPITAL_COMMUNITY): Payer: Self-pay

## 2021-03-01 MED FILL — Ketoconazole Shampoo 2%: CUTANEOUS | 30 days supply | Qty: 120 | Fill #0 | Status: CN

## 2021-03-01 MED FILL — Clobetasol Propionate Soln 0.05%: CUTANEOUS | 30 days supply | Qty: 50 | Fill #0 | Status: CN

## 2021-03-06 ENCOUNTER — Other Ambulatory Visit (HOSPITAL_COMMUNITY): Payer: Self-pay

## 2021-03-06 ENCOUNTER — Other Ambulatory Visit: Payer: Self-pay | Admitting: Family Medicine

## 2021-03-06 ENCOUNTER — Encounter (INDEPENDENT_AMBULATORY_CARE_PROVIDER_SITE_OTHER): Payer: Self-pay | Admitting: Family Medicine

## 2021-03-06 ENCOUNTER — Ambulatory Visit (INDEPENDENT_AMBULATORY_CARE_PROVIDER_SITE_OTHER): Payer: 59 | Admitting: Family Medicine

## 2021-03-06 ENCOUNTER — Other Ambulatory Visit: Payer: Self-pay

## 2021-03-06 VITALS — BP 102/69 | HR 78 | Temp 97.9°F | Ht 63.0 in | Wt 210.0 lb

## 2021-03-06 DIAGNOSIS — R7303 Prediabetes: Secondary | ICD-10-CM

## 2021-03-06 DIAGNOSIS — E559 Vitamin D deficiency, unspecified: Secondary | ICD-10-CM | POA: Diagnosis not present

## 2021-03-06 DIAGNOSIS — Z6841 Body Mass Index (BMI) 40.0 and over, adult: Secondary | ICD-10-CM

## 2021-03-06 DIAGNOSIS — Z9189 Other specified personal risk factors, not elsewhere classified: Secondary | ICD-10-CM

## 2021-03-06 MED ORDER — METFORMIN HCL 500 MG PO TABS
500.0000 mg | ORAL_TABLET | Freq: Three times a day (TID) | ORAL | 0 refills | Status: DC
  Filled 2021-03-06: qty 90, 30d supply, fill #0

## 2021-03-06 MED ORDER — VITAMIN D (ERGOCALCIFEROL) 1.25 MG (50000 UNIT) PO CAPS
1.0000 | ORAL_CAPSULE | ORAL | 0 refills | Status: DC
  Filled 2021-03-06: qty 4, 28d supply, fill #0

## 2021-03-06 NOTE — Telephone Encounter (Signed)
Ok to refill??  Last office visit 07/02/2020.  Last refill 02/07/2021.

## 2021-03-07 ENCOUNTER — Other Ambulatory Visit (HOSPITAL_COMMUNITY): Payer: Self-pay

## 2021-03-07 MED ORDER — AMPHETAMINE-DEXTROAMPHET ER 25 MG PO CP24
25.0000 mg | ORAL_CAPSULE | Freq: Every morning | ORAL | 0 refills | Status: DC
  Filled 2021-03-07: qty 30, 30d supply, fill #0

## 2021-03-10 ENCOUNTER — Other Ambulatory Visit: Payer: Self-pay

## 2021-03-10 ENCOUNTER — Ambulatory Visit (INDEPENDENT_AMBULATORY_CARE_PROVIDER_SITE_OTHER): Payer: 59 | Admitting: Family Medicine

## 2021-03-10 ENCOUNTER — Encounter: Payer: Self-pay | Admitting: Family Medicine

## 2021-03-10 DIAGNOSIS — M25561 Pain in right knee: Secondary | ICD-10-CM | POA: Diagnosis not present

## 2021-03-10 NOTE — Progress Notes (Signed)
I saw and examined the patient with Dr. Elouise Munroe and agree with assessment and plan as outlined.    Right posterior knee pain since hyperextending it over the weekend (saw a snake, jumped over).  No effusion today.  Tender over lateral hamstring and gastroc tendons.  Slight posteromedial joint line tenderness, no click with McMurray's.  Ligaments feel stable.  Suspect tendon strain injury.  Will try PT, voltaren gel.  Consider x-rays/MRI if fails to improve.

## 2021-03-10 NOTE — Progress Notes (Signed)
Office Visit Note   Patient: [name]           Date of Birth: [birth year]           MRN: [id number] Visit Date: 03/10/2021 Requested by: Susy Frizzle, MD [address] [place],  [place] [address] PCP: Susy Frizzle, MD  Subjective: Chief Complaint  Patient presents with  . Right Knee - Pain    Pain in posterior knee since 03/08/21: she was walking on a trail in [place], saw a snake in front of her -- jumped over the snake and landed back hard on the right leg - she thinks it hyperextended the knee. She got a knee sleeve to wear and then continued walking that weekend. Continues to have pain with weightbearing and with flexing the foot with leg perfectly straight.    HPI: 50yo F presenting to clinic with concerns of right posterior knee pain x3 days, since a hyperextension injury on Saturday. Patient states she was hiking, and was startled by a snake, causing her to quickly step backwards and hyperextend her knee. She was able to walk from the scene (with a significant limp on her toes) to get back to their lodge. Denies any significant swelling. She has been trying to self-treat with a compression sleeve, which offers some relief. No catching/locking. States she notices most pain with dorsiflexing her foot.               ROS:   All other systems were reviewed and are negative.  Objective: Vital Signs: There were no vitals taken for this visit.  Physical Exam:  General:  Alert and oriented, in no acute distress. Pulm:  Breathing unlabored. Psy:  Normal mood, congruent affect. Skin:  No bruising or rashes.   RIGHT KNEE EXAM:  General: Mildly atagic gait, favoring right leg.  Standing exam: No varus or valgus deformity of the knee.   Seated Exam:  No patellar crepitus.    Palpation: Significant tenderness to palpation over calf origin in posterior knee, medial > lateral. Mild tenderness to palpation over medial >  lateral joint lines. No tenderness with  palpation of patella or patellar tendon. No tenderness over patellar facets.   Supine exam: No effusion, normal patellar mobility.   Ligamentous Exam:  No pain or laxity with anterior/posterior drawer.  No obvious Sag.  No pain or laxity with varus/valgus stress across the knee.   Meniscus:  McMurray with no pain or deep clicking.   Strength: Hip flexion (L1), Hip Aduction (L2), Knee Extension (L3) are 5/5 Bilaterally Foot Inversion (L4), Dorsiflexion (L5), and Eversion (S1) 5/5 Bilaterally  Sensation: Intact to light touch medial and lateral aspects of lower extremities, and lateral, dorsal, and medial aspects of foot.    Imaging: No results found.  Assessment & Plan: 50yo F presenting to clinic 3 days after a hyperextension injury to her right knee. Examination without obvious effusion, instability, or meniscal signs- all of which are very reassuring at this time. Suspect calf strain at the origin.  - Continue to wear compression sleeve if this offers comfort - Calf stretches, and can consider PT - Voltaren Gel, states she has some at home - If no improvement, RTC for advanced imaging.      Procedures: No procedures performed        PMFS History: Patient Active Problem List   Diagnosis Date Noted  . Palpitations 10/03/2020  . At risk for diabetes mellitus 08/12/2020  .  Asthma 05/21/2020  . H/O: hysterectomy 05/21/2020  . Prediabetes 12/29/2018  . Essential hypertension 09/02/2018  . Atypical chest pain 08/18/2018  . Shortness of breath 08/18/2018  . ADD (attention deficit disorder) 12/29/2017  . Chronic diastolic CHF (congestive heart failure) ([place]) 08/24/2016  . Sinus tachycardia ([place]) 07/23/2016  . Diastolic dysfunction 99/24/2683  . PVC (premature ventricular contraction) 12/17/2015  . Family history of premature coronary artery disease 03/14/2015  . GERD (gastroesophageal reflux disease) 05/02/2014  . Anal fissure 05/02/2014  . Leg swelling 05/07/2013  .  Obesity, unspecified 05/07/2013  . Spasm of muscle 12/05/2010  . Pain in thoracic spine 12/05/2010  . Acute upper respiratory infection 12/05/2010  . Vitamin D deficiency 08/20/2010  . Other abnormal glucose 08/20/2010  . Hypothyroidism 08/20/2010  . Depressive disorder, not elsewhere classified 07/31/2010  . Anxiety state 07/31/2010  . Seborrheic dermatitis, unspecified 09/18/2008   Past Medical History:  Diagnosis Date  . Anxiety   . Asthma   . Back pain   . Chest pain   . Chronic CHF ([place])   . Dyspnea   . Echocardiogram 10/25/2015   EF 60-65, normal wall motion, grade 1 diastolic dysfunction, GLS -19.1%  . Edema of left lower extremity   . Fracture dislocation of elbow joint 01/15/2013   right  . GERD (gastroesophageal reflux disease)    Omeprazole as needed, none in 6 mos.  . Hyperlipidemia   . Hypertension   . Joint pain   . Obesity   . Palpitations   . Prediabetes   . R/L Cardiac catheterization 08/18/2018   Normal coronary arteries, mean RA 6, mean PA 17, PCWP 10  . Seborrheic dermatitis   . Sleep apnea    uses CPAP "most nights"    Family History  Problem Relation Age of Onset  . Diabetes Sister   . Heart disease Father 54       MI--1st Dx CAD. Died2that time  . Sudden death Father   . Diabetes Mother   . Hypertension Mother   . Heart disease Mother   . Obesity Mother   . Heart disease Maternal Uncle   . Ovarian cancer Maternal Grandmother   . Diabetes Maternal Grandmother   . Heart disease Maternal Grandmother        Event organiser  . Diabetes Paternal Grandmother   . Liver disease Paternal Aunt   . Heart disease Maternal Grandfather     Past Surgical History:  Procedure Laterality Date  . [place]; 10/10/2000   x 2  . KNEE ARTHROSCOPY Left 1991   also in 2019  . LAPAROSCOPIC ASSISTED VAGINAL HYSTERECTOMY  11/02/2006  . LAPAROSCOPIC LYSIS OF ADHESIONS  03/06/2005  . RADIAL HEAD ARTHROPLASTY Right 01/17/2013   Procedure: RIGHT  RADIAL HEAD ARTHROPLASTY OPEN REDUCTION INTERNAL FIXATION CORONOID ;  Surgeon: Tennis Must, MD;  Location: [facility];  Service: Orthopedics;  Laterality: Right;  . RIGHT/LEFT HEART CATH AND CORONARY ANGIOGRAPHY N/A 08/18/2018   Procedure: RIGHT/LEFT HEART CATH AND CORONARY ANGIOGRAPHY;  Surgeon: Nelva Bush, MD;  Location: [facility];  Service: Cardiovascular;  Laterality: N/A;  . TUBAL LIGATION  03/06/2005   Social History   Occupational History  . Occupation: CMA    Employer: [employer]  Tobacco Use  . Smoking status: Never Smoker  . Smokeless tobacco: Never Used  Substance and Sexual Activity  . Alcohol use: No  . Drug use: No  . Sexual activity: Not on file

## 2021-03-11 NOTE — Progress Notes (Signed)
Chief Complaint:   OBESITY [name] is here to discuss her progress with her obesity treatment plan along with follow-up of her obesity related diagnoses. [name] is on keeping a food journal and adhering to recommended goals of 1200-1500 calories and 85+ grams of protein daily and states she is following her eating plan approximately 85% of the time. [name] states she is walking for 20-25 minutes 3-4 times per week.  Today's visit was #: 75 Starting weight: 235 lbs Starting date: 11/30/2018 Today's weight: 210 lbs Today's date: 03/06/2021 Total lbs lost to date: 25 Total lbs lost since last in-office visit: 0  Interim History: [name] is retaining a bit of water weight today. She has maintained her weight loss otherwise. She increased her dose of Saxenda to 1.2 mg slowly and she had some GI symptoms but this has resolved.  Subjective:   1. Pre-diabetes [name] is doing well on metformin, and she denies nausea, vomiting, or hypoglycemia.  2. Vitamin D deficiency [name] is stable on Vit D, but her level is not yet at goal.  3. At risk for heart disease [name] is at a higher than average risk for cardiovascular disease due to obesity.   Assessment/Plan:   1. Pre-diabetes [name] will continue to work on weight loss, exercise, and decreasing simple carbohydrates to help decrease the risk of diabetes. We will refill metformin for 1 month.  - metFORMIN (GLUCOPHAGE) 500 MG tablet; Take 1 tablet (500 mg total) by mouth in the morning, at noon, and at bedtime.  Dispense: 90 tablet; Refill: 0  2. Vitamin D deficiency Low Vitamin D level contributes to fatigue and are associated with obesity, breast, and colon cancer. We will refill prescription Vitamin D for 1 month. [name] will follow-up for routine testing of Vitamin D, at least 2-3 times per year to avoid over-replacement.  - Vitamin D, Ergocalciferol, (DRISDOL) 1.25 MG (50000 UNIT) CAPS capsule; Take 1 capsule (50,000 Units  total) by mouth every 7 (seven) days.  Dispense: 4 capsule; Refill: 0  3. At risk for heart disease [name] was given approximately 15 minutes of coronary artery disease prevention counseling today. She is 50 y.o. female and has risk factors for heart disease including obesity. We discussed intensive lifestyle modifications today with an emphasis on specific weight loss instructions and strategies.   Repetitive spaced learning was employed today to elicit superior memory formation and behavioral change.  4. Obesity with current BMI of 37.2 [name] is currently in the action stage of change. As such, her goal is to continue with weight loss efforts. She has agreed to the Category 3 Plan.   We discussed various medication options to help [name] with her weight loss efforts and we both agreed to continue Saxenda at 1.2 mg (no refill needed).  Exercise goals: As is.  Behavioral modification strategies: increasing lean protein intake.  [name] has agreed to follow-up with our clinic in 4 weeks. She was informed of the importance of frequent follow-up visits to maximize her success with intensive lifestyle modifications for her multiple health conditions.   Objective:   Blood pressure 102/69, pulse 78, temperature 97.9 F (36.6 C), height 5\' 3"  (1.6 m), weight 210 lb (95.3 kg), SpO2 99 %. Body mass index is 37.2 kg/m.  General: Cooperative, alert, well developed, in no acute distress. HEENT: Conjunctivae and lids unremarkable. Cardiovascular: Regular rhythm.  Lungs: Normal work of breathing. Neurologic: No focal deficits.   Lab Results  Component Value Date   CREATININE 0.66  10/03/2020   BUN 19 10/03/2020   NA 140 10/03/2020   K 4.3 10/03/2020   CL 101 10/03/2020   CO2 26 10/03/2020   Lab Results  Component Value Date   ALT 11 05/29/2020   AST 13 05/29/2020   ALKPHOS 73 05/29/2020   BILITOT 0.3 05/29/2020   Lab Results  Component Value Date   HGBA1C 5.6 05/29/2020    HGBA1C 5.4 11/27/2019   HGBA1C 5.4 06/27/2019   HGBA1C 6.0 (H) 11/30/2018   Lab Results  Component Value Date   INSULIN 15.2 05/29/2020   INSULIN 10.3 11/27/2019   INSULIN 12.5 06/27/2019   INSULIN 14.5 11/30/2018   Lab Results  Component Value Date   TSH 4.040 10/03/2020   Lab Results  Component Value Date   CHOL 261 (H) 05/29/2020   HDL 49 05/29/2020   LDLCALC 175 (H) 05/29/2020   TRIG 197 (H) 05/29/2020   CHOLHDL 4.4 11/30/2018   Lab Results  Component Value Date   WBC 9.8 11/30/2018   HGB 13.6 11/30/2018   HCT 39.1 11/30/2018   MCV 87 11/30/2018   PLT 303 11/30/2018   No results found for: IRON, TIBC, FERRITIN  Attestation Statements:   Reviewed by clinician on day of visit: allergies, medications, problem list, medical history, surgical history, family history, social history, and previous encounter notes.   I, Trixie Dredge, am acting as transcriptionist for Dennard Nip, MD.  I have reviewed the above documentation for accuracy and completeness, and I agree with the above. -  Dennard Nip, MD

## 2021-03-12 ENCOUNTER — Other Ambulatory Visit (HOSPITAL_COMMUNITY): Payer: Self-pay

## 2021-03-13 ENCOUNTER — Ambulatory Visit (INDEPENDENT_AMBULATORY_CARE_PROVIDER_SITE_OTHER): Payer: 59 | Admitting: Physical Therapy

## 2021-03-13 ENCOUNTER — Other Ambulatory Visit: Payer: Self-pay

## 2021-03-13 DIAGNOSIS — M25561 Pain in right knee: Secondary | ICD-10-CM | POA: Diagnosis not present

## 2021-03-13 DIAGNOSIS — R2689 Other abnormalities of gait and mobility: Secondary | ICD-10-CM | POA: Diagnosis not present

## 2021-03-13 DIAGNOSIS — R600 Localized edema: Secondary | ICD-10-CM

## 2021-03-13 DIAGNOSIS — M6281 Muscle weakness (generalized): Secondary | ICD-10-CM | POA: Diagnosis not present

## 2021-03-13 NOTE — Therapy (Signed)
[facility] Physical Therapy [address] [place], [place], [address] Phone: [phone]   Fax:  [phone]  Physical Therapy Evaluation  Patient Details  Name: [name] MRN: [id number] Date of Birth: 08-May-1971 Referring Provider (PT): Hilts, MD   Encounter Date: 03/13/2021   PT End of Session - 03/13/21 2158    Visit Number 1    Number of Visits 8    Date for PT Re-Evaluation 04/24/21    PT Start Time 1520    PT Stop Time 1600    PT Time Calculation (min) 40 min    Activity Tolerance Patient tolerated treatment well    Behavior During Therapy [facility] for tasks assessed/performed           Past Medical History:  Diagnosis Date  . Anxiety   . Asthma   . Back pain   . Chest pain   . Chronic CHF ([place])   . Dyspnea   . Echocardiogram 10/25/2015   EF 60-65, normal wall motion, grade 1 diastolic dysfunction, GLS -19.1%  . Edema of left lower extremity   . Fracture dislocation of elbow joint 01/15/2013   right  . GERD (gastroesophageal reflux disease)    Omeprazole as needed, none in 6 mos.  . Hyperlipidemia   . Hypertension   . Joint pain   . Obesity   . Palpitations   . Prediabetes   . R/L Cardiac catheterization 08/18/2018   Normal coronary arteries, mean RA 6, mean PA 17, PCWP 10  . Seborrheic dermatitis   . Sleep apnea    uses CPAP "most nights"    Past Surgical History:  Procedure Laterality Date  . [place]; 10/10/2000   x 2  . KNEE ARTHROSCOPY Left 1991   also in 2019  . LAPAROSCOPIC ASSISTED VAGINAL HYSTERECTOMY  11/02/2006  . LAPAROSCOPIC LYSIS OF ADHESIONS  03/06/2005  . RADIAL HEAD ARTHROPLASTY Right 01/17/2013   Procedure: RIGHT RADIAL HEAD ARTHROPLASTY OPEN REDUCTION INTERNAL FIXATION CORONOID ;  Surgeon: Tennis Must, MD;  Location: [facility];  Service: Orthopedics;  Laterality: Right;  . RIGHT/LEFT HEART CATH AND CORONARY ANGIOGRAPHY N/A 08/18/2018   Procedure: RIGHT/LEFT HEART CATH AND  CORONARY ANGIOGRAPHY;  Surgeon: Nelva Bush, MD;  Location: [facility];  Service: Cardiovascular;  Laterality: N/A;  . TUBAL LIGATION  03/06/2005    There were no vitals filed for this visit.    Subjective Assessment - 03/13/21 2149    Subjective Pain in posterior Rt knee since 03/08/21: she was walking on a trail in [place], saw a snake in front of her -- jumped over the snake and landed back hard on the right leg - she thinks it hyperextended the knee. She got a knee sleeve to wear and then continued walking that weekend. Continues to have pain with weightbearing and with flexing the foot with leg perfectly straight. MD impression is possible calf strain. She has had Lt knee scope X 2 in past, most recently in 2019.    Diagnostic tests No recent imaging for Rt knee.    Patient Stated Goals reduce pain    Currently in Pain? Yes    Pain Score 4     Pain Location Knee    Pain Orientation Right    Pain Descriptors / Indicators Aching    Pain Type Acute pain    Pain Radiating Towards denies N/T    Pain Onset More than a month ago    Pain Frequency Intermittent  Aggravating Factors  standing, extending the knee    Pain Relieving Factors rest              [facility] PT Assessment - 03/13/21 0001      Assessment   Medical Diagnosis Rt knee pain    Referring Provider (PT) Hilts, MD    Onset Date/Surgical Date 03/08/21    Next MD Visit nothing scheduled    Prior Therapy none      Precautions   Precautions None      Balance Screen   Has the patient fallen in the past 6 months No    Has the patient had a decrease in activity level because of a fear of falling?  No    Is the patient reluctant to leave their home because of a fear of falling?  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, sport and exercise     Leisure hike, go for walks      Cognition   Overall Cognitive Status Within Functional Limits for tasks assessed      ROM / Strength   AROM / PROM / Strength AROM;Strength      AROM   Overall AROM Comments Rt knee ROM WNL but pain into full extension or DF, Rt ankle ROM WNL except DF limted to 3 degrees from neutral      Strength   Overall Strength Comments 5- overall Rt leg strength grossly      Flexibility   Soft Tissue Assessment /Muscle Length --   incrased tightness in H.S and gastroc on Rt     Special Tests   Other special tests negative knee ligamentous tests      Transfers   Transfers Independent with all Transfers      Ambulation/Gait   Gait Comments community ambulator, very mild antalgic gait on Rt                      Objective measurements completed on examination: See above findings.       [facility] Adult PT Treatment/Exercise - 03/13/21 0001      Exercises   Exercises Knee/Hip      Knee/Hip Exercises: Stretches   Active Hamstring Stretch Right;3 reps;30 seconds    Gastroc Stretch Right;3 reps;30 seconds    Soleus Stretch Right;3 reps;30 seconds      Modalities   Modalities Ultrasound;Iontophoresis      Ultrasound   Ultrasound Location Rt posterior knee, gastroc    Ultrasound Parameters 1.2 w/cm2, 1 mhz, 100% with biofreeze 8 min    Ultrasound Goals Pain      Iontophoresis   Type of Iontophoresis Dexamethasone    Location Rt posterior knee    Dose 1.0 CC    Time 4-6 hour wear home patch      Manual Therapy   Manual therapy comments STM/IASTM to Rt hamstrings/gastroc                  PT Education - 03/13/21 2158    Education Details HEP,POC,Ionto and U.S rationale and instructions    Person(s) Educated Patient    Methods Explanation;Demonstration;Verbal cues;Handout    Comprehension Verbalized understanding;Need further instruction            PT Short Term Goals - 03/13/21 2202  PT SHORT TERM GOAL #1   Title  Pt will be Ind and compliant with HEP    Time 4    Period Weeks    Status New    Target Date 04/10/21      PT SHORT TERM GOAL #2   Title Access Code: [email or web address]             PT Long Term Goals - 03/13/21 2203      PT LONG TERM GOAL #1   Title Pt will be able to perform usual work duties, and walk community distances and stairs with less than 2/10 overalll pain.    Time 8    Period Weeks    Status New    Target Date 05/08/21      PT LONG TERM GOAL #2   Title Pt will improve Rt ankle DF ROM to [facility]    Time 8    Period Weeks    Status New      PT LONG TERM GOAL #3   Title Pt will improve Rt leg strength to 5/5 Painfree.    Baseline 5-    Status New                  Plan - 03/13/21 2159    Clinical Impression Statement Pt presents with acute Rt posterior knee pain likely strain/strain from hyperextending the knee. She will benefit from skilled PT to address her funcitonal defiicts listed below. Trialed U.S., Ionto, stretching, and manual therapy today in efforts to decrease pain and symptoms and provided her with HEP to trial.    Examination-Activity Limitations Bend;Lift;Locomotion Level;Squat;Stairs;Stand    Examination-Participation Restrictions Cleaning;Community Activity;Driving;Occupation;Shop    Stability/Clinical Decision Making Stable/Uncomplicated    Clinical Decision Making Low    Rehab Potential Good    PT Frequency 2x / week   1-2   PT Duration 6 weeks    PT Treatment/Interventions ADLs/Self Care Home Management;Electrical Stimulation;Iontophoresis 4mg /ml Dexamethasone;Moist Heat;Ultrasound;Gait training;Stair training;Therapeutic activities;Therapeutic exercise;Balance training;Neuromuscular re-education;Patient/family education;Manual techniques;Vasopneumatic Device;Taping;Dry needling;Passive range of motion;Joint Manipulations    PT Next Visit Plan review and update HEP, how was modalaties and manual from last time    PT Home Exercise Plan Access Code:  [email or web address]    Consulted and Agree with Plan of Care Patient           Patient will benefit from skilled therapeutic intervention in order to improve the following deficits and impairments:  Decreased activity tolerance,Decreased endurance,Decreased range of motion,Decreased strength,Difficulty walking,Increased edema,Pain,Impaired flexibility  Visit Diagnosis: Acute pain of right knee  Muscle weakness (generalized)  Other abnormalities of gait and mobility  Localized edema     Problem List Patient Active Problem List   Diagnosis Date Noted  . Palpitations 10/03/2020  . At risk for diabetes mellitus 08/12/2020  . Asthma 05/21/2020  . H/O: hysterectomy 05/21/2020  . Prediabetes 12/29/2018  . Essential hypertension 09/02/2018  . Atypical chest pain 08/18/2018  . Shortness of breath 08/18/2018  . ADD (attention deficit disorder) 12/29/2017  . Chronic diastolic CHF (congestive heart failure) ([place]) 08/24/2016  . Sinus tachycardia ([place]) 07/23/2016  . Diastolic dysfunction 60/73/7106  . PVC (premature ventricular contraction) 12/17/2015  . Family history of premature coronary artery disease 03/14/2015  . GERD (gastroesophageal reflux disease) 05/02/2014  . Anal fissure 05/02/2014  . Leg swelling 05/07/2013  . Obesity, unspecified 05/07/2013  . Spasm of muscle 12/05/2010  . Pain in thoracic spine 12/05/2010  . Acute upper respiratory infection 12/05/2010  . Vitamin D  deficiency 08/20/2010  . Other abnormal glucose 08/20/2010  . Hypothyroidism 08/20/2010  . Depressive disorder, not elsewhere classified 07/31/2010  . Anxiety state 07/31/2010  . Seborrheic dermatitis, unspecified 09/18/2008    Silvestre Mesi 03/13/2021, 10:06 PM  [facility] Physical Therapy [address] [place], [place], [address] Phone: [phone]   Fax:  [phone]  Name: [name] MRN: [id number] Date of Birth: 18-Aug-1971

## 2021-03-13 NOTE — Patient Instructions (Signed)
Access Code: [email or web address] URL: [email or web address] Date: 03/13/2021 Prepared by: Elsie Ra  Exercises Gastroc Stretch on Wall - 1 x daily - 3 x weekly - 1 sets - 3 reps - 30 hold Soleus Stretch on Wall - 2 x daily - 6 x weekly - 1 sets - 3 reps - 30 hold Seated Hamstring Stretch - 2 x daily - 6 x weekly - 1 sets - 3 reps - 30 hold Heel Toe Raises with Counter Support - 2 x daily - 6 x weekly - 2-3 sets - 10 reps Standing Knee Flexion - 2 x daily - 6 x weekly - 3 sets - 10 reps

## 2021-03-21 ENCOUNTER — Other Ambulatory Visit (HOSPITAL_COMMUNITY): Payer: Self-pay

## 2021-03-21 MED FILL — Ketoconazole Shampoo 2%: CUTANEOUS | 30 days supply | Qty: 120 | Fill #0 | Status: AC

## 2021-03-21 MED FILL — Clobetasol Propionate Soln 0.05%: CUTANEOUS | 30 days supply | Qty: 50 | Fill #0 | Status: AC

## 2021-03-26 ENCOUNTER — Other Ambulatory Visit (HOSPITAL_COMMUNITY): Payer: Self-pay

## 2021-03-27 ENCOUNTER — Other Ambulatory Visit: Payer: Self-pay

## 2021-03-27 ENCOUNTER — Ambulatory Visit (INDEPENDENT_AMBULATORY_CARE_PROVIDER_SITE_OTHER): Payer: 59 | Admitting: Physical Therapy

## 2021-03-27 DIAGNOSIS — R2689 Other abnormalities of gait and mobility: Secondary | ICD-10-CM | POA: Diagnosis not present

## 2021-03-27 DIAGNOSIS — M25561 Pain in right knee: Secondary | ICD-10-CM

## 2021-03-27 DIAGNOSIS — R600 Localized edema: Secondary | ICD-10-CM | POA: Diagnosis not present

## 2021-03-27 DIAGNOSIS — M6281 Muscle weakness (generalized): Secondary | ICD-10-CM | POA: Diagnosis not present

## 2021-03-27 NOTE — Therapy (Signed)
[facility] Physical Therapy [address] [place], [place], [address] Phone: [phone]   Fax:  [phone]  Physical Therapy Treatment  Patient Details  Name: [name] MRN: [id number] Date of Birth: 1971/01/09 Referring Provider (PT): Hilts, MD   Encounter Date: 03/27/2021   PT End of Session - 03/27/21 0946    Visit Number 2    Number of Visits 8    Date for PT Re-Evaluation 04/24/21    PT Start Time 0848    PT Stop Time 0926    PT Time Calculation (min) 38 min    Activity Tolerance Patient tolerated treatment well    Behavior During Therapy [facility] for tasks assessed/performed           Past Medical History:  Diagnosis Date  . Anxiety   . Asthma   . Back pain   . Chest pain   . Chronic CHF ([place])   . Dyspnea   . Echocardiogram 10/25/2015   EF 60-65, normal wall motion, grade 1 diastolic dysfunction, GLS -19.1%  . Edema of left lower extremity   . Fracture dislocation of elbow joint 01/15/2013   right  . GERD (gastroesophageal reflux disease)    Omeprazole as needed, none in 6 mos.  . Hyperlipidemia   . Hypertension   . Joint pain   . Obesity   . Palpitations   . Prediabetes   . R/L Cardiac catheterization 08/18/2018   Normal coronary arteries, mean RA 6, mean PA 17, PCWP 10  . Seborrheic dermatitis   . Sleep apnea    uses CPAP "most nights"    Past Surgical History:  Procedure Laterality Date  . [place]; 10/10/2000   x 2  . KNEE ARTHROSCOPY Left 1991   also in 2019  . LAPAROSCOPIC ASSISTED VAGINAL HYSTERECTOMY  11/02/2006  . LAPAROSCOPIC LYSIS OF ADHESIONS  03/06/2005  . RADIAL HEAD ARTHROPLASTY Right 01/17/2013   Procedure: RIGHT RADIAL HEAD ARTHROPLASTY OPEN REDUCTION INTERNAL FIXATION CORONOID ;  Surgeon: Tennis Must, MD;  Location: [place];  Service: Orthopedics;  Laterality: Right;  . RIGHT/LEFT HEART CATH AND CORONARY ANGIOGRAPHY N/A 08/18/2018   Procedure: RIGHT/LEFT HEART CATH AND  CORONARY ANGIOGRAPHY;  Surgeon: Nelva Bush, MD;  Location: [place] CV LAB;  Service: Cardiovascular;  Laterality: N/A;  . TUBAL LIGATION  03/06/2005    There were no vitals filed for this visit.   Subjective Assessment - 03/27/21 0945    Subjective Her Rt knee is overall getting better but still with some pain in her posterior Rt knee esepcially after performing yard work. Walking is getting overalll easier. She has done the HEP some of the time    Diagnostic tests No recent imaging for Rt knee.    Patient Stated Goals reduce pain    Pain Onset More than a month ago            [facility] Adult PT Treatment/Exercise - 03/27/21 0001      Knee/Hip Exercises: Stretches   Active Hamstring Stretch Right;3 reps;30 seconds    Active Hamstring Stretch Limitations seated    Gastroc Stretch 3 reps;30 seconds;Both    Gastroc Stretch Limitations slantboard    Soleus Stretch Both;3 reps;30 seconds    Soleus Stretch Limitations slantboard      Knee/Hip Exercises: Standing   Heel Raises Limitations heel and toe raises 2 sec holds 2X10      Knee/Hip Exercises: Seated   Long Arc Quad Right;3 sets;10 reps  Long Arc Quad Weight 3 lbs.    Hamstring Curl Right;3 sets;10 reps    Hamstring Limitations green      Ultrasound   Ultrasound Location Rt posterior knee, gastroc    Ultrasound Parameters 1.2 w/cm2, 1 mhz, 100% with biofreeze 7 min    Ultrasound Goals Pain      Iontophoresis   Type of Iontophoresis Dexamethasone    Location Rt posterior knee    Dose 1.0 CC    Time 4-6 hour wear home patch      Manual Therapy   Manual therapy comments STM/IASTM to Rt hamstrings/gastroc                    PT Short Term Goals - 03/13/21 2202      PT SHORT TERM GOAL #1   Title Pt will be Ind and compliant with HEP    Time 4    Period Weeks    Status New    Target Date 04/10/21      PT SHORT TERM GOAL #2   Title Access Code: [email or web address]             PT Long Term Goals -  03/13/21 2203      PT LONG TERM GOAL #1   Title Pt will be able to perform usual work duties, and walk community distances and stairs with less than 2/10 overalll pain.    Time 8    Period Weeks    Status New    Target Date 05/08/21      PT LONG TERM GOAL #2   Title Pt will improve Rt ankle DF ROM to [facility].    Time 8    Period Weeks    Status New      PT LONG TERM GOAL #3   Title Pt will improve Rt leg strength to 5/5 Painfree.    Baseline 5-    Status New                 Plan - 03/27/21 0947    Clinical Impression Statement She appears to be making some early progress. Continued with stretching and modalaties and added more strengthening today with good tolerance.    Examination-Activity Limitations Bend;Lift;Locomotion Level;Squat;Stairs;Stand    Examination-Participation Restrictions Cleaning;Community Activity;Driving;Occupation;Shop    Stability/Clinical Decision Making Stable/Uncomplicated    Rehab Potential Good    PT Frequency 2x / week   1-2   PT Duration 6 weeks    PT Treatment/Interventions ADLs/Self Care Home Management;Electrical Stimulation;Iontophoresis 4mg /ml Dexamethasone;Moist Heat;Ultrasound;Gait training;Stair training;Therapeutic activities;Therapeutic exercise;Balance training;Neuromuscular re-education;Patient/family education;Manual techniques;Vasopneumatic Device;Taping;Dry needling;Passive range of motion;Joint Manipulations    PT Next Visit Plan progress HEP    PT Home Exercise Plan Access Code: [email or web address]    Consulted and Agree with Plan of Care Patient           Patient will benefit from skilled therapeutic intervention in order to improve the following deficits and impairments:  Decreased activity tolerance,Decreased endurance,Decreased range of motion,Decreased strength,Difficulty walking,Increased edema,Pain,Impaired flexibility  Visit Diagnosis: Acute pain of right knee  Muscle weakness (generalized)  Other abnormalities of gait and  mobility  Localized edema     Problem List Patient Active Problem List   Diagnosis Date Noted  . Palpitations 10/03/2020  . At risk for diabetes mellitus 08/12/2020  . Asthma 05/21/2020  . H/O: hysterectomy 05/21/2020  . Prediabetes 12/29/2018  . Essential hypertension 09/02/2018  . Atypical chest pain 08/18/2018  . Shortness of breath 08/18/2018  .  ADD (attention deficit disorder) 12/29/2017  . Chronic diastolic CHF (congestive heart failure) ([place]) 08/24/2016  . Sinus tachycardia ([place]) 07/23/2016  . Diastolic dysfunction 16/38/4536  . PVC (premature ventricular contraction) 12/17/2015  . Family history of premature coronary artery disease 03/14/2015  . GERD (gastroesophageal reflux disease) 05/02/2014  . Anal fissure 05/02/2014  . Leg swelling 05/07/2013  . Obesity, unspecified 05/07/2013  . Spasm of muscle 12/05/2010  . Pain in thoracic spine 12/05/2010  . Acute upper respiratory infection 12/05/2010  . Vitamin D deficiency 08/20/2010  . Other abnormal glucose 08/20/2010  . Hypothyroidism 08/20/2010  . Depressive disorder, not elsewhere classified 07/31/2010  . Anxiety state 07/31/2010  . Seborrheic dermatitis, unspecified 09/18/2008    Silvestre Mesi 03/27/2021, 9:54 AM  [facility] Physical Therapy [address] [place], [place], [address] Phone: [phone]   Fax:  [phone]  Name: NAVY ROTHSCHILD MRN: [id number] Date of Birth: 12/23/1970

## 2021-03-31 ENCOUNTER — Ambulatory Visit (INDEPENDENT_AMBULATORY_CARE_PROVIDER_SITE_OTHER): Payer: 59 | Admitting: Physical Therapy

## 2021-03-31 ENCOUNTER — Other Ambulatory Visit: Payer: Self-pay

## 2021-03-31 DIAGNOSIS — R600 Localized edema: Secondary | ICD-10-CM

## 2021-03-31 DIAGNOSIS — M25561 Pain in right knee: Secondary | ICD-10-CM | POA: Diagnosis not present

## 2021-03-31 DIAGNOSIS — R2689 Other abnormalities of gait and mobility: Secondary | ICD-10-CM

## 2021-03-31 DIAGNOSIS — M6281 Muscle weakness (generalized): Secondary | ICD-10-CM

## 2021-03-31 NOTE — Therapy (Addendum)
[facility] Physical Therapy [address] [place], [place], [address] Phone: [phone]   Fax:  [phone]  Physical Therapy Treatment/Discharge   Patient Details  Name: [name] MRN: [id number] Date of Birth: [birth year] Referring Provider (PT): Hilts, MD   Encounter Date: 03/31/2021   PT End of Session - 03/31/21 1644     Visit Number 3    Number of Visits 8    Date for PT Re-Evaluation 04/24/21    PT Start Time 1602    PT Stop Time 1642    PT Time Calculation (min) 40 min    Activity Tolerance Patient tolerated treatment well    Behavior During Therapy [facility] for tasks assessed/performed             Past Medical History:  Diagnosis Date   Anxiety    Asthma    Back pain    Chest pain    Chronic CHF ([place])    Dyspnea    Echocardiogram 10/25/2015   EF 60-65, normal wall motion, grade 1 diastolic dysfunction, GLS -19.1%   Edema of left lower extremity    Fracture dislocation of elbow joint 01/15/2013   right   GERD (gastroesophageal reflux disease)    Omeprazole as needed, none in 6 mos.   Hyperlipidemia    Hypertension    Joint pain    Obesity    Palpitations    Prediabetes    R/L Cardiac catheterization 08/18/2018   Normal coronary arteries, mean RA 6, mean PA 17, PCWP 10   Seborrheic dermatitis    Sleep apnea    uses CPAP "most nights"    Past Surgical History:  Procedure Laterality Date   CESAREAN SECTION  1998; 10/10/2000   x 2   KNEE ARTHROSCOPY Left 1991   also in 2019   [place]  11/02/2006   LAPAROSCOPIC LYSIS OF ADHESIONS  03/06/2005   RADIAL HEAD ARTHROPLASTY Right 01/17/2013   Procedure: RIGHT RADIAL HEAD ARTHROPLASTY OPEN REDUCTION INTERNAL FIXATION CORONOID ;  Surgeon: Tennis Must, MD;  Location: [place];  Service: Orthopedics;  Laterality: Right;   RIGHT/LEFT HEART CATH AND CORONARY ANGIOGRAPHY N/A 08/18/2018   Procedure: RIGHT/LEFT HEART CATH AND CORONARY  ANGIOGRAPHY;  Surgeon: Nelva Bush, MD;  Location: [place] CV LAB;  Service: Cardiovascular;  Laterality: N/A;   TUBAL LIGATION  03/06/2005    There were no vitals filed for this visit.   Subjective Assessment - 03/31/21 1617     Subjective Relays overall some improvement in her overall knee pain, buut still some pain with yardwork over the weekend, if she stands too long, or with stairs.    Diagnostic tests No recent imaging for Rt knee.    Patient Stated Goals reduce pain    Pain Onset More than a month ago              [facility] Adult PT Treatment/Exercise - 03/31/21 0001       Knee/Hip Exercises: Stretches   Active Hamstring Stretch Right;3 reps;30 seconds    Active Hamstring Stretch Limitations seated    Gastroc Stretch 3 reps;30 seconds;Both    Gastroc Stretch Limitations slantboard    Soleus Stretch Both;3 reps;30 seconds    Soleus Stretch Limitations slantboard      Knee/Hip Exercises: Aerobic   Nustep L6X8 min      Knee/Hip Exercises: Machines for Strengthening   Cybex Knee Extension 10 lbs bilat 3X10    Cybex Knee Flexion 25 lbs  bilat 3X10    Total Gym Leg Press 125 lbs bilat 3X10      Knee/Hip Exercises: Standing   Heel Raises Limitations heel and toe raises 5 sec holds 2X10    Other Standing Knee Exercises SL RDL on Rt with one UE support X10      Manual Therapy   Manual therapy comments STM/IASTM to Rt hamstrings/gastroc                      PT Short Term Goals - 03/13/21 2202       PT SHORT TERM GOAL #1   Title Pt will be Ind and compliant with HEP    Time 4    Period Weeks    Status New    Target Date 04/10/21      PT SHORT TERM GOAL #2   Title Access Code: [email or web address]               PT Long Term Goals - 03/13/21 2203       PT LONG TERM GOAL #1   Title Pt will be able to perform usual work duties, and walk community distances and stairs with less than 2/10 overalll pain.    Time 8    Period Weeks    Status New     Target Date 05/08/21      PT LONG TERM GOAL #2   Title Pt will improve Rt ankle DF ROM to [facility].    Time 8    Period Weeks    Status New      PT LONG TERM GOAL #3   Title Pt will improve Rt leg strength to 5/5 Painfree.    Baseline 5-    Status New                   Plan - 03/31/21 1645     Clinical Impression Statement She has made subjective and objective progress today and not having much pain at her session so we held on modalaties to see how she does. She has one more visit scheduled and will likely be ready to discharge then if she continues to do well.    Examination-Activity Limitations Bend;Lift;Locomotion Level;Squat;Stairs;Stand    Examination-Participation Restrictions Cleaning;Community Activity;Driving;Occupation;Shop    Stability/Clinical Decision Making Stable/Uncomplicated    Rehab Potential Good    PT Frequency 2x / week   1-2   PT Duration 6 weeks    PT Treatment/Interventions ADLs/Self Care Home Management;Electrical Stimulation;Iontophoresis 4mg /ml Dexamethasone;Moist Heat;Ultrasound;Gait training;Stair training;Therapeutic activities;Therapeutic exercise;Balance training;Neuromuscular re-education;Patient/family education;Manual techniques;Vasopneumatic Device;Taping;Dry needling;Passive range of motion;Joint Manipulations    PT Next Visit Plan progress HEP and DC if doing well    PT Home Exercise Plan Access Code: [email or web address]    Consulted and Agree with Plan of Care Patient             Patient will benefit from skilled therapeutic intervention in order to improve the following deficits and impairments:  Decreased activity tolerance,Decreased endurance,Decreased range of motion,Decreased strength,Difficulty walking,Increased edema,Pain,Impaired flexibility  Visit Diagnosis: Acute pain of right knee  Muscle weakness (generalized)  Other abnormalities of gait and mobility  Localized edema     Problem List Patient Active Problem List    Diagnosis Date Noted   Palpitations 10/03/2020   At risk for diabetes mellitus 08/12/2020   Asthma 05/21/2020   H/O: hysterectomy 05/21/2020   Prediabetes 12/29/2018   Essential hypertension 09/02/2018   Atypical chest pain 08/18/2018   Shortness of breath  08/18/2018   ADD (attention deficit disorder) 12/29/2017   Chronic diastolic CHF (congestive heart failure) ([place]) 08/24/2016   Sinus tachycardia ([facility]) 21/19/4174   Diastolic dysfunction 06/29/4817   PVC (premature ventricular contraction) 12/17/2015   Family history of premature coronary artery disease 03/14/2015   GERD (gastroesophageal reflux disease) 05/02/2014   Anal fissure 05/02/2014   Leg swelling 05/07/2013   Obesity, unspecified 05/07/2013   Spasm of muscle 12/05/2010   Pain in thoracic spine 12/05/2010   Acute upper respiratory infection 12/05/2010   Vitamin D deficiency 08/20/2010   Other abnormal glucose 08/20/2010   Hypothyroidism 08/20/2010   Depressive disorder, not elsewhere classified 07/31/2010   Anxiety state 07/31/2010   Seborrheic dermatitis, unspecified 09/18/2008    Marrianne Mood Sheilah Rayos,PT,DPT 03/31/2021, 4:47 PM  PHYSICAL THERAPY DISCHARGE SUMMARY  Visits from Start of Care: 3  Current functional level related to goals / functional outcomes: See note   Remaining deficits: See note   Education / Equipment: HEP   Patient agrees to discharge. Patient goals were partially met. Patient is being discharged due to not returning since the last visit.  Scot Jun, PT, DPT, OCS, ATC 05/13/21  1:30 PM     [facility] [address] [place], [place], [address] Phone: [phone]   Fax:  [phone]  Name: SHAWNETTE AUGELLO MRN: [id number] Date of Birth: [birth year]

## 2021-04-02 ENCOUNTER — Encounter: Payer: 59 | Admitting: Physical Therapy

## 2021-04-02 ENCOUNTER — Encounter: Payer: Self-pay | Admitting: Physical Therapy

## 2021-04-03 ENCOUNTER — Other Ambulatory Visit: Payer: Self-pay

## 2021-04-03 ENCOUNTER — Encounter (INDEPENDENT_AMBULATORY_CARE_PROVIDER_SITE_OTHER): Payer: Self-pay | Admitting: Family Medicine

## 2021-04-03 ENCOUNTER — Ambulatory Visit (INDEPENDENT_AMBULATORY_CARE_PROVIDER_SITE_OTHER): Payer: 59 | Admitting: Family Medicine

## 2021-04-03 ENCOUNTER — Other Ambulatory Visit (HOSPITAL_COMMUNITY): Payer: Self-pay

## 2021-04-03 VITALS — BP 104/72 | HR 79 | Temp 98.0°F | Ht 63.0 in | Wt 207.0 lb

## 2021-04-03 DIAGNOSIS — E559 Vitamin D deficiency, unspecified: Secondary | ICD-10-CM

## 2021-04-03 DIAGNOSIS — Z6841 Body Mass Index (BMI) 40.0 and over, adult: Secondary | ICD-10-CM | POA: Diagnosis not present

## 2021-04-03 DIAGNOSIS — Z9189 Other specified personal risk factors, not elsewhere classified: Secondary | ICD-10-CM | POA: Diagnosis not present

## 2021-04-03 MED ORDER — VITAMIN D (ERGOCALCIFEROL) 1.25 MG (50000 UNIT) PO CAPS
1.0000 | ORAL_CAPSULE | ORAL | 0 refills | Status: DC
  Filled 2021-04-03: qty 4, 28d supply, fill #0

## 2021-04-07 NOTE — Progress Notes (Signed)
Chief Complaint:   OBESITY [name] is here to discuss her progress with her obesity treatment plan along with follow-up of her obesity related diagnoses. [name] is on the Category 3 Plan and states she is following her eating plan approximately 85-90% of the time. [name] states she is walking for 25 minutes 5 times per week.  Today's visit was #: 22 Starting weight: 235 lbs Starting date: 11/30/2018 Today's weight: 207 lbs Today's date: 04/03/2021 Total lbs lost to date: 28 Total lbs lost since last in-office visit: 3  Interim History: [name] has done better with weight loss. She has been doing well with exercise and physical therapy for her knee. She is tolerating Saxenda well and usually has no GI upset except maybe when she overeats.  Subjective:   1. Vitamin D deficiency [name] is stable on Vit D, and she denies signs of over-replacement.  2. At risk for nausea [name] is at risk for nausea due to [name] and going on vacation.  Assessment/Plan:   1. Vitamin D deficiency Low Vitamin D level contributes to fatigue and are associated with obesity, breast, and colon cancer. We will refill prescription Vitamin D for 1 month. We will recheck labs in 1 month, and [name] will follow-up for routine testing of Vitamin D, at least 2-3 times per year to avoid over-replacement.  - Vitamin D, Ergocalciferol, (DRISDOL) 1.25 MG (50000 UNIT) CAPS capsule; Take 1 capsule (50,000 Units total) by mouth every 7 (seven) days.  Dispense: 4 capsule; Refill: 0  2. At risk for nausea [name] was given approximately 15 minutes of nausea prevention counseling today. [name] is at risk for nausea due to her new or current medication. She was encouraged to titrate her medication slowly, make sure to stay hydrated, eat smaller portions throughout the day, and avoid high fat meals.   3. Obesity with current BMI 36.8 [name] is currently in the action stage of change. As such, her goal is  to continue with weight loss efforts. She has agreed to the Category 3 Plan.   We discussed various medication options to help [name] with her weight loss efforts and we both agreed to continue Saxenda as is, and she will continue to follow up as directed.  We will recheck fasting labs at her next visit.  Exercise goals: As is.  Behavioral modification strategies: increasing lean protein intake and holiday eating strategies .  [name] has agreed to follow-up with our clinic in 4 weeks. She was informed of the importance of frequent follow-up visits to maximize her success with intensive lifestyle modifications for her multiple health conditions.   Objective:   Blood pressure 104/72, pulse 79, temperature 98 F (36.7 C), height 5\' 3"  (1.6 m), weight 207 lb (93.9 kg), SpO2 98 %. Body mass index is 36.67 kg/m.  General: Cooperative, alert, well developed, in no acute distress. HEENT: Conjunctivae and lids unremarkable. Cardiovascular: Regular rhythm.  Lungs: Normal work of breathing. Neurologic: No focal deficits.   Lab Results  Component Value Date   CREATININE 0.66 10/03/2020   BUN 19 10/03/2020   NA 140 10/03/2020   K 4.3 10/03/2020   CL 101 10/03/2020   CO2 26 10/03/2020   Lab Results  Component Value Date   ALT 11 05/29/2020   AST 13 05/29/2020   ALKPHOS 73 05/29/2020   BILITOT 0.3 05/29/2020   Lab Results  Component Value Date   HGBA1C 5.6 05/29/2020   HGBA1C 5.4 11/27/2019   HGBA1C 5.4  06/27/2019   HGBA1C 6.0 (H) 11/30/2018   Lab Results  Component Value Date   INSULIN 15.2 05/29/2020   INSULIN 10.3 11/27/2019   INSULIN 12.5 06/27/2019   INSULIN 14.5 11/30/2018   Lab Results  Component Value Date   TSH 4.040 10/03/2020   Lab Results  Component Value Date   CHOL 261 (H) 05/29/2020   HDL 49 05/29/2020   LDLCALC 175 (H) 05/29/2020   TRIG 197 (H) 05/29/2020   CHOLHDL 4.4 11/30/2018   Lab Results  Component Value Date   WBC 9.8 11/30/2018   HGB  13.6 11/30/2018   HCT 39.1 11/30/2018   MCV 87 11/30/2018   PLT 303 11/30/2018   No results found for: IRON, TIBC, FERRITIN  Attestation Statements:   Reviewed by clinician on day of visit: allergies, medications, problem list, medical history, surgical history, family history, social history, and previous encounter notes.   I, Trixie Dredge, am acting as transcriptionist for Dennard Nip, MD.  I have reviewed the above documentation for accuracy and completeness, and I agree with the above. -  *Dennard Nip, MD **

## 2021-04-08 DIAGNOSIS — G4733 Obstructive sleep apnea (adult) (pediatric): Secondary | ICD-10-CM | POA: Diagnosis not present

## 2021-04-17 ENCOUNTER — Other Ambulatory Visit: Payer: Self-pay

## 2021-04-17 ENCOUNTER — Encounter: Payer: Self-pay | Admitting: Family Medicine

## 2021-04-18 ENCOUNTER — Other Ambulatory Visit (HOSPITAL_COMMUNITY): Payer: Self-pay

## 2021-04-18 MED ORDER — ALPRAZOLAM 0.5 MG PO TABS
0.5000 mg | ORAL_TABLET | Freq: Three times a day (TID) | ORAL | 0 refills | Status: DC | PRN
  Filled 2021-04-18: qty 45, 15d supply, fill #0

## 2021-04-29 ENCOUNTER — Other Ambulatory Visit: Payer: Self-pay | Admitting: Family Medicine

## 2021-04-29 ENCOUNTER — Other Ambulatory Visit (HOSPITAL_COMMUNITY): Payer: Self-pay

## 2021-04-29 ENCOUNTER — Other Ambulatory Visit: Payer: Self-pay | Admitting: Cardiovascular Disease

## 2021-04-29 MED ORDER — METOPROLOL TARTRATE 25 MG PO TABS
25.0000 mg | ORAL_TABLET | Freq: Two times a day (BID) | ORAL | 2 refills | Status: DC
  Filled 2021-04-29: qty 180, 90d supply, fill #0

## 2021-04-30 ENCOUNTER — Encounter: Payer: Self-pay | Admitting: Family Medicine

## 2021-04-30 ENCOUNTER — Other Ambulatory Visit (HOSPITAL_COMMUNITY): Payer: Self-pay

## 2021-04-30 MED FILL — Ketoconazole Shampoo 2%: CUTANEOUS | 30 days supply | Qty: 120 | Fill #1 | Status: AC

## 2021-04-30 NOTE — Telephone Encounter (Signed)
Ok to refill??  Last office visit 03/07/2021.  Last refill 07/02/2020.  Of note, letter sent to patient to schedule OV.

## 2021-05-01 ENCOUNTER — Other Ambulatory Visit (HOSPITAL_COMMUNITY): Payer: Self-pay

## 2021-05-01 ENCOUNTER — Emergency Department (HOSPITAL_COMMUNITY)
Admission: EM | Admit: 2021-05-01 | Discharge: 2021-05-01 | Disposition: A | Discharge status: Home / Self Care | Payer: 59 | Attending: Emergency Medicine | Admitting: Emergency Medicine

## 2021-05-01 DIAGNOSIS — E876 Hypokalemia: Secondary | ICD-10-CM

## 2021-05-01 DIAGNOSIS — J45909 Unspecified asthma, uncomplicated: Secondary | ICD-10-CM | POA: Diagnosis not present

## 2021-05-01 DIAGNOSIS — Z79899 Other long term (current) drug therapy: Secondary | ICD-10-CM | POA: Insufficient documentation

## 2021-05-01 DIAGNOSIS — I5032 Chronic diastolic (congestive) heart failure: Secondary | ICD-10-CM | POA: Diagnosis not present

## 2021-05-01 DIAGNOSIS — R55 Syncope and collapse: Secondary | ICD-10-CM | POA: Diagnosis not present

## 2021-05-01 DIAGNOSIS — I110 Hypertensive heart disease with heart failure: Secondary | ICD-10-CM | POA: Diagnosis not present

## 2021-05-01 DIAGNOSIS — R231 Pallor: Secondary | ICD-10-CM | POA: Diagnosis not present

## 2021-05-01 DIAGNOSIS — E039 Hypothyroidism, unspecified: Secondary | ICD-10-CM | POA: Diagnosis not present

## 2021-05-01 LAB — CBC WITH DIFFERENTIAL/PLATELET
Abs Immature Granulocytes: 0.06 10*3/uL (ref 0.00–0.07)
Basophils Absolute: 0 10*3/uL (ref 0.0–0.1)
Basophils Relative: 0 %
Eosinophils Absolute: 0.1 10*3/uL (ref 0.0–0.5)
Eosinophils Relative: 1 %
HCT: 42.3 % (ref 36.0–46.0)
Hemoglobin: 13.7 g/dL (ref 12.0–15.0)
Immature Granulocytes: 1 %
Lymphocytes Relative: 25 %
Lymphs Abs: 3.1 10*3/uL (ref 0.7–4.0)
MCH: 28.6 pg (ref 26.0–34.0)
MCHC: 32.4 g/dL (ref 30.0–36.0)
MCV: 88.3 fL (ref 80.0–100.0)
Monocytes Absolute: 0.7 10*3/uL (ref 0.1–1.0)
Monocytes Relative: 6 %
Neutro Abs: 8.5 10*3/uL — ABNORMAL HIGH (ref 1.7–7.7)
Neutrophils Relative %: 67 %
Platelets: 263 10*3/uL (ref 150–400)
RBC: 4.79 MIL/uL (ref 3.87–5.11)
RDW: 13.8 % (ref 11.5–15.5)
WBC: 12.5 10*3/uL — ABNORMAL HIGH (ref 4.0–10.5)
nRBC: 0 % (ref 0.0–0.2)

## 2021-05-01 LAB — BASIC METABOLIC PANEL
Anion gap: 12 (ref 5–15)
BUN: 16 mg/dL (ref 6–20)
CO2: 22 mmol/L (ref 22–32)
Calcium: 8 mg/dL — ABNORMAL LOW (ref 8.9–10.3)
Chloride: 106 mmol/L (ref 98–111)
Creatinine, Ser: 0.72 mg/dL (ref 0.44–1.00)
GFR, Estimated: >60 mL/min (ref >60)
Glucose, Bld: 109 mg/dL — ABNORMAL HIGH (ref 70–99)
Potassium: 2.9 mmol/L — ABNORMAL LOW (ref 3.5–5.1)
Sodium: 140 mmol/L (ref 135–145)

## 2021-05-01 LAB — D-DIMER, QUANTITATIVE: D-Dimer, Quant: 0.33 ug/mL-FEU (ref 0.00–0.50)

## 2021-05-01 MED ORDER — ONDANSETRON HCL 4 MG/2ML IJ SOLN
4.0000 mg | Freq: Once | INTRAMUSCULAR | Status: AC
  Administered 2021-05-01: 4 mg via INTRAVENOUS
  Filled 2021-05-01: qty 2

## 2021-05-01 MED ORDER — POTASSIUM CHLORIDE CRYS ER 20 MEQ PO TBCR
40.0000 meq | EXTENDED_RELEASE_TABLET | Freq: Once | ORAL | Status: AC
  Administered 2021-05-01: 40 meq via ORAL
  Filled 2021-05-01: qty 2

## 2021-05-01 MED ORDER — AMPHETAMINE-DEXTROAMPHET ER 25 MG PO CP24
25.0000 mg | ORAL_CAPSULE | Freq: Every morning | ORAL | 0 refills | Status: DC
  Filled 2021-05-01: qty 30, 30d supply, fill #0

## 2021-05-01 MED ORDER — POTASSIUM CHLORIDE 10 MEQ/100ML IV SOLN
10.0000 meq | Freq: Once | INTRAVENOUS | Status: AC
  Administered 2021-05-01: 10 meq via INTRAVENOUS
  Filled 2021-05-01: qty 100

## 2021-05-01 NOTE — ED Provider Notes (Signed)
[facility] EMERGENCY DEPARTMENT Provider Note   CSN: [id number] Arrival date & time: 05/01/21  0424     History Chief Complaint  Patient presents with   syncopal episode    [name] is a 50 y.o. female.  HPI     This is a 50 year old female with a history of CHF, reflux, hypertension, hyperlipidemia, palpitations who presents with an episode of syncope.  Patient got up to go to the bathroom.  She remembers getting up but does not remember anything after that.  She states that she felt like she was going to get sick.  She did not feel lightheaded or have tunnel vision that she recalls.  Her husband found her on the toilet.  He reports episode of loss of consciousness of approximately 30 seconds.  He reports that she appeared dazed after the episode but was not necessarily confused.  He did not note any seizure activity.  No history of seizures or syncope although patient does report that she has frequent palpitations and is currently on metoprolol.  Denies chest discomfort, shortness of breath.  No recent fevers or illnesses.  Past Medical History:  Diagnosis Date   Anxiety    Asthma    Back pain    Chest pain    Chronic CHF (HCC)    Dyspnea    Echocardiogram 10/25/2015   EF 60-65, normal wall motion, grade 1 diastolic dysfunction, GLS -19.1%   Edema of left lower extremity    Fracture dislocation of elbow joint 01/15/2013   right   GERD (gastroesophageal reflux disease)    Omeprazole as needed, none in 6 mos.   Hyperlipidemia    Hypertension    Joint pain    Obesity    Palpitations    Prediabetes    R/L Cardiac catheterization 08/18/2018   Normal coronary arteries, mean RA 6, mean PA 17, PCWP 10   Seborrheic dermatitis    Sleep apnea    uses CPAP "most nights"    Patient Active Problem List   Diagnosis Date Noted   Palpitations 10/03/2020   At risk for diabetes mellitus 08/12/2020   Asthma 05/21/2020   H/O: hysterectomy 05/21/2020    Prediabetes 12/29/2018   Essential hypertension 09/02/2018   Atypical chest pain 08/18/2018   Shortness of breath 08/18/2018   ADD (attention deficit disorder) 12/29/2017   Chronic diastolic CHF (congestive heart failure) ([place]) 08/24/2016   Sinus tachycardia (HCC) 81/82/9937   Diastolic dysfunction 16/96/7893   PVC (premature ventricular contraction) 12/17/2015   Family history of premature coronary artery disease 03/14/2015   GERD (gastroesophageal reflux disease) 05/02/2014   Anal fissure 05/02/2014   Leg swelling 05/07/2013   Obesity, unspecified 05/07/2013   Spasm of muscle 12/05/2010   Pain in thoracic spine 12/05/2010   Acute upper respiratory infection 12/05/2010   Vitamin D deficiency 08/20/2010   Other abnormal glucose 08/20/2010   Hypothyroidism 08/20/2010   Depressive disorder, not elsewhere classified 07/31/2010   Anxiety state 07/31/2010   Seborrheic dermatitis, unspecified 09/18/2008    Past Surgical History:  Procedure Laterality Date   [place]; 10/10/2000   x 2   KNEE ARTHROSCOPY Left 1991   also in 2019   [place]  11/02/2006   LAPAROSCOPIC LYSIS OF ADHESIONS  03/06/2005   RADIAL HEAD ARTHROPLASTY Right 01/17/2013   Procedure: RIGHT RADIAL HEAD ARTHROPLASTY OPEN REDUCTION INTERNAL FIXATION CORONOID ;  Surgeon: Tennis Must, MD;  Location: [facility];  Service: Orthopedics;  Laterality: Right;   RIGHT/LEFT HEART CATH AND CORONARY ANGIOGRAPHY N/A 08/18/2018   Procedure: RIGHT/LEFT HEART CATH AND CORONARY ANGIOGRAPHY;  Surgeon: Nelva Bush, MD;  Location: [name];  Service: Cardiovascular;  Laterality: N/A;   TUBAL LIGATION  03/06/2005     OB History     Gravida  2   Para      Term      Preterm      AB      Living  2      SAB      IAB      Ectopic      Multiple      Live Births              Family History  Problem Relation Age of Onset   Diabetes Sister     Heart disease Father 74       MI--1st Dx CAD. Died2that time   Sudden death Father    Diabetes Mother    Hypertension Mother    Heart disease Mother    Obesity Mother    Heart disease Maternal Uncle    Ovarian cancer Maternal Grandmother    Diabetes Maternal Grandmother    Heart disease Maternal Grandmother        Great Grandmother   Diabetes Paternal Grandmother    Liver disease Paternal Aunt    Heart disease Maternal Grandfather     Social History   Tobacco Use   Smoking status: Never   Smokeless tobacco: Never  Substance Use Topics   Alcohol use: No   Drug use: No    Home Medications Prior to Admission medications   Medication Sig Start Date End Date Taking? Authorizing Provider  albuterol (PROVENTIL HFA;VENTOLIN HFA) 108 (90 Base) MCG/ACT inhaler Inhale 2 puffs into the lungs every 6 (six) hours as needed for wheezing. 12/02/15   Orlena Sheldon, PA-C  ALPRAZolam Duanne Moron) 0.5 MG tablet Take 1 tablet (0.5 mg total) by mouth 3 (three) times daily as needed. 04/18/21   Susy Frizzle, MD  amphetamine-dextroamphetamine (ADDERALL XR) 25 MG 24 hr capsule Take 1 capsule by mouth every morning. 03/07/21   Susy Frizzle, MD  clobetasol (TEMOVATE) 0.05 % external solution APPLY 2 TIMES A DAY TO SCALP AS NEEDED FOR FLARES 01/02/21 01/02/22  Harriett Sine, MD  furosemide (LASIX) 20 MG tablet TAKE 2 TABLETS (40 MG TOTAL) BY MOUTH DAILY AS NEEDED FOR SWELLING. 02/06/21 02/06/22  Susy Frizzle, MD  ibuprofen (ADVIL,MOTRIN) 200 MG tablet Take 400 mg by mouth 2 (two) times daily as needed for headache or moderate pain.    [provider]  Insulin Pen Needle 32G X 4 MM MISC USE ONCE DAILY TO INJECT SAXENDA. 02/06/21 02/06/22  Dennard Nip D, MD  ketoconazole (NIZORAL) 2 % shampoo APPLY 1 APPLICATION ON THE SKIN AS DIRECTED; LATHER AND LET SIT FOR A FEW MINUTES PRIOR TO RINSING WHEN YOU SHOWER. 01/02/21 01/02/22  Harriett Sine, MD  Liraglutide -Weight Management 18 MG/3ML  SOPN INJECT 3 MG INTO THE SKIN DAILY. 02/06/21 02/06/22  Dennard Nip D, MD  metFORMIN (GLUCOPHAGE) 500 MG tablet Take 1 tablet (500 mg total) by mouth in the morning, at noon, and at bedtime. 03/06/21   Dennard Nip D, MD  metoprolol tartrate (LOPRESSOR) 25 MG tablet Take 1 tablet (25 mg total) by mouth 2 (two) times daily. 04/29/21   Nahser, Wonda Cheng, MD  Vitamin D, Ergocalciferol, (DRISDOL) 1.25 MG (50000 UNIT)  CAPS capsule Take 1 capsule (50,000 Units total) by mouth every 7 (seven) days. 04/03/21   Starlyn Skeans, MD    Allergies    Adhesive [tape] and Latex  Review of Systems   Review of Systems  Constitutional:  Negative for fever.  Respiratory:  Negative for shortness of breath.   Cardiovascular:  Positive for palpitations. Negative for chest pain.  Gastrointestinal:  Positive for nausea. Negative for abdominal pain and vomiting.  Genitourinary:  Negative for dysuria.  Musculoskeletal:  Negative for back pain.  Neurological:  Positive for syncope.  All other systems reviewed and are negative.  Physical Exam Updated Vital Signs BP 113/63 (BP Location: Right Arm)   Pulse 91   Temp 97.9 F (36.6 C) (Oral)   Resp 16   Ht 1.575 m (5\' 2" )   Wt 94.3 kg   SpO2 97%   BMI 38.04 kg/m   Physical Exam Vitals and nursing note reviewed.  Constitutional:      Appearance: She is well-developed. She is obese.  HENT:     Head: Normocephalic and atraumatic.     Nose: Nose normal.     Mouth/Throat:     Mouth: Mucous membranes are moist.  Eyes:     Pupils: Pupils are equal, round, and reactive to light.  Cardiovascular:     Rate and Rhythm: Normal rate and regular rhythm.     Heart sounds: Normal heart sounds. No murmur heard. Pulmonary:     Effort: Pulmonary effort is normal. No respiratory distress.     Breath sounds: No wheezing.  Abdominal:     General: Bowel sounds are normal.     Palpations: Abdomen is soft.  Musculoskeletal:     Cervical back: Neck supple.     Right  lower leg: No edema.     Left lower leg: No edema.  Skin:    General: Skin is warm and dry.  Neurological:     Mental Status: She is alert and oriented to person, place, and time.  Psychiatric:        Mood and Affect: Mood normal.    ED Results / Procedures / Treatments   Labs (all labs ordered are listed, but only abnormal results are displayed) Labs Reviewed  BASIC METABOLIC PANEL - Abnormal; Notable for the following components:      Result Value   Potassium 2.9 (*)    Glucose, Bld 109 (*)    Calcium 8.0 (*)    All other components within normal limits  CBC WITH DIFFERENTIAL/PLATELET  URINALYSIS, ROUTINE W REFLEX MICROSCOPIC    EKG None  Radiology No results found.  Procedures Procedures   Medications Ordered in ED Medications - No data to display  ED Course  I have reviewed the triage vital signs and the nursing notes.  Pertinent labs & imaging results that were available during my care of the patient were reviewed by me and considered in my medical decision making (see chart for details).    MDM Rules/Calculators/A&P                          This is a 50 year old female who presents with an episode of loss of consciousness.  She is overall nontoxic and initial vital signs are reassuring.  She reports history of palpitations and is experiencing those now.  No chest pain or shortness of breath.  She is nonfocal on exam.  Suspect vasovagal episode although she did not have  a prodrome.  No indication of seizure.  Labs obtained.  Notable for hypokalemia to 2.9.  This was replaced.  EKG shows no acute ischemic or arrhythmic changes.  Without active chest pain or shortness of breath, with doubt ACS.  D-dimer was sent to her stratify for PE.  This is negative.  Patient remained clinically stable.  She is receiving her potassium.  She will need to ambulate and be at her baseline.  She should follow-up with cardiology as an outpatient.    Final Clinical Impression(s) / ED  Diagnoses Final diagnoses:  Syncope and collapse  Hypokalemia    Rx / DC Orders ED Discharge Orders     None        Merryl Hacker, MD 05/03/21 2301

## 2021-05-01 NOTE — ED Provider Notes (Signed)
12:10 PM Patient in no distress.  I have seen and evaluated patient twice, after assuming care at signout. She has had no arrhythmia on monitor, has had no decompensation, no additional syncope. She, her husband and I have discussed all findings, she has had resuscitation with fluids and potassium. They are both amenable to following up with cardiology.   Carmin Muskrat, MD 05/01/21 [phone]

## 2021-05-01 NOTE — ED Notes (Signed)
[name] mother [phone] requesting an update or to speak with her daughter

## 2021-05-01 NOTE — ED Triage Notes (Signed)
Pt arrived via ems due to a syncopal episode. EMS reports pt was going to the bathroom and her husband found her on the toilet not responding. Pt husband guided pt to the floor as instructed by 911 operator. Pt did not hit head. EMS reports pt was diaphoretic on arrival as well as alert/ EMS denies seizure like activity and their stroke assessment was negative. EMS reports blood sugar was 105, bp 122/80. Pt reports she does not remember what happened, she just remembers her husband trying to wake her up.

## 2021-05-01 NOTE — Discharge Instructions (Addendum)
As discussed, your evaluation today has been largely reassuring.  But, it is important that you monitor your condition carefully, and do not hesitate to return to the ED if you develop new, or concerning changes in your condition. ? ?Otherwise, please follow-up with your physician for appropriate ongoing care. ? ?

## 2021-05-01 NOTE — ED Notes (Signed)
Pt was given non-skid socks & disposable scrub pants, IV was removed & her husband is walking to restroom with pt. Tol all activity well , ready for D/C.

## 2021-05-07 ENCOUNTER — Other Ambulatory Visit (HOSPITAL_COMMUNITY): Payer: Self-pay

## 2021-05-07 ENCOUNTER — Other Ambulatory Visit: Payer: Self-pay

## 2021-05-07 ENCOUNTER — Encounter: Payer: Self-pay | Admitting: Cardiovascular Disease

## 2021-05-07 ENCOUNTER — Ambulatory Visit: Payer: 59 | Admitting: Cardiovascular Disease

## 2021-05-07 VITALS — BP 128/78 | HR 108 | Ht 62.0 in | Wt 208.2 lb

## 2021-05-07 DIAGNOSIS — R55 Syncope and collapse: Secondary | ICD-10-CM | POA: Diagnosis not present

## 2021-05-07 DIAGNOSIS — R002 Palpitations: Secondary | ICD-10-CM | POA: Diagnosis not present

## 2021-05-07 DIAGNOSIS — I5032 Chronic diastolic (congestive) heart failure: Secondary | ICD-10-CM

## 2021-05-07 MED ORDER — SPIRONOLACTONE 25 MG PO TABS
25.0000 mg | ORAL_TABLET | ORAL | 3 refills | Status: DC
  Filled 2021-05-07: qty 38, 89d supply, fill #0
  Filled 2021-08-13: qty 38, 89d supply, fill #1
  Filled 2021-12-12: qty 38, 89d supply, fill #2
  Filled 2022-03-19: qty 38, 89d supply, fill #3

## 2021-05-07 MED ORDER — METOPROLOL TARTRATE 50 MG PO TABS
50.0000 mg | ORAL_TABLET | Freq: Two times a day (BID) | ORAL | 3 refills | Status: DC
  Filled 2021-05-07: qty 180, 90d supply, fill #0
  Filled 2022-02-24: qty 180, 90d supply, fill #1

## 2021-05-07 NOTE — Progress Notes (Signed)
Cardiology Office Note   Date:  05/07/2021   ID:  [name], [name] 07-05-1971, MRN [id number]  PCP:  Susy Frizzle, MD  Cardiologist:   Mertie Moores, MD   Chief Complaint  Patient presents with   Tachycardia   Problem List 1.  Diastolic dysfunction 2. Hypertension 3. Obesity 4. Palpitations  5. Obstructive sleep apnea - has not been using her CPAP, Needs to see a pulmonologist to get another mask.    [name] is a 50 y.o. female who presents for  Further evaluation of her shortness breath and palpitations. Has had palpitations for several months.   Seem to be worse at night , while watching TV.  Also has dyspnea - primarily at rest.   Not related to lying down. Has DOE Works out at [employer] , has been going for the past month. Has had shortness of breath since getting back to the gym.    Was given lasix 20 as needed - takes it once every 1-2 months.   Seems to need to take the lasix after she has eaten more salty foods than she should .    She knows that she should limit salt intake but has not made any real effort.   Has lost 4 lbs since Jan. 1.  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    Husband is a Hotel manager.   2 boys at home,  Age 85 and 35.   Sept. 7, 2017: [name] is seen for a follow up visit This past Saturday ,  Has had some hot flashes / sweats  HR has been very high for the past several weeks.  (is on adderall)  No diarrhea. No weight loss.   Has not been using her CPAP mask - ends up taking the CPAP mask off after 15 minutes.   Oct. 9, 2017:  [name] is seen back for follow up of Her chronic diastolic congestive heart failure. She was seen in Sept.  We stopped the Bystolic and started Metoprolol 50 BID  Has been checking BP , Now works at [employer].   Mar 17, 2018: [name] is seen back today for further evaluation of her sinus tachycardia and diastolic heart failure. Had partial  left knee replacement [name] )  Occasional episodes of shortness of breath.   Is walking at lunch - gets a little short of breath  Is making progress with her walking   Wt today is 228 lbs.   December 01, 2019:  [name] is seen back today for follow-up of her palpitations and chronic diastolic congestive heart failure. Wt. Today is 203 lbs ( down 25 lbs from last visit)  Rare CP.   Some palpitations  Works at [employer] ( now owned by [employer])  Walking regularly ,   Working on diet .   Is having a new CPAP mask made.    February 10, 2021:  [name] is seen today for follow p of her palpitations and chronic diastolic CHF Has OSA Had covid in jan. Wt today is 211  Up 8 lbs from last yer  Is  exercising  Event monitor showed :   Sinus rhythm with occasional PVCs.  Diastolic BP is elevated.   Sees Dr. Leafy Ro at [facility] program    May 07, 2021: [name] is seen today for a follow-up visit.  Approximately 1 week ago her husband found her passed  out on the commode.  He tried to shake her and arouse her but she was unresponsive.  They called EMS and took her to the hospital.  She was found to have profound hypokalemia with a potassium of 2.7. She was given IV fluids, oral potassium, IV potassium.  Her white blood cell count was elevated at 12.5.  She denies any dysuria or urgency.  She denies any blood in her stool or abdominal pain, cramps.  She denies any sore throat. No COVID symptoms.  Wt is 208 lbs.   HR has been elevated since her visit to the hospital  No TSH recently  Taking lasix 20 mg PRN ( usually this is twice a week)    Previous work-up includes a right left heart catheterization in October, 2019.  She had normal coronary arteries.  She had normal LVEF and normal cardiac output by Fick calculations.  Tries to avoid salt - has done better   Echo from December, 2016 shows normal left ventricular systolic function.  She has grade 1 diastolic  dysfunction.   Past Medical History:  Diagnosis Date   Anxiety    Asthma    Back pain    Chest pain    Chronic CHF (HCC)    Dyspnea    Echocardiogram 10/25/2015   EF 60-65, normal wall motion, grade 1 diastolic dysfunction, GLS -19.1%   Edema of left lower extremity    Fracture dislocation of elbow joint 01/15/2013   right   GERD (gastroesophageal reflux disease)    Omeprazole as needed, none in 6 mos.   Hyperlipidemia    Hypertension    Joint pain    Obesity    Palpitations    Prediabetes    R/L Cardiac catheterization 08/18/2018   Normal coronary arteries, mean RA 6, mean PA 17, PCWP 10   Seborrheic dermatitis    Sleep apnea    uses CPAP "most nights"    Past Surgical History:  Procedure Laterality Date   CESAREAN SECTION  1998; 10/10/2000   x 2   KNEE ARTHROSCOPY Left 1991   also in 2019   [place]  11/02/2006   LAPAROSCOPIC LYSIS OF ADHESIONS  03/06/2005   RADIAL HEAD ARTHROPLASTY Right 01/17/2013   Procedure: RIGHT RADIAL HEAD ARTHROPLASTY OPEN REDUCTION INTERNAL FIXATION CORONOID ;  Surgeon: Tennis Must, MD;  Location: [facility];  Service: Orthopedics;  Laterality: Right;   RIGHT/LEFT HEART CATH AND CORONARY ANGIOGRAPHY N/A 08/18/2018   Procedure: RIGHT/LEFT HEART CATH AND CORONARY ANGIOGRAPHY;  Surgeon: Nelva Bush, MD;  Location: [facility];  Service: Cardiovascular;  Laterality: N/A;   TUBAL LIGATION  03/06/2005     Current Outpatient Medications  Medication Sig Dispense Refill   albuterol (PROVENTIL HFA;VENTOLIN HFA) 108 (90 Base) MCG/ACT inhaler Inhale 2 puffs into the lungs every 6 (six) hours as needed for wheezing. 1 Inhaler 11   ALPRAZolam (XANAX) 0.5 MG tablet Take 1 tablet (0.5 mg total) by mouth 3 (three) times daily as needed. 45 tablet 0   amphetamine-dextroamphetamine (ADDERALL XR) 25 MG 24 hr capsule Take 1 capsule by mouth every morning. 30 capsule 0   clobetasol (TEMOVATE) 0.05 %  external solution APPLY 2 TIMES A DAY TO SCALP AS NEEDED FOR FLARES (Patient taking differently: Apply 1 application topically 2 (two) times daily as needed (flares).) 50 mL 11   ibuprofen (ADVIL,MOTRIN) 200 MG tablet Take 400 mg by mouth 2 (two) times daily as needed for headache or  moderate pain.     Insulin Pen Needle 32G X 4 MM MISC USE ONCE DAILY TO INJECT SAXENDA. 100 each 0   ketoconazole (NIZORAL) 2 % shampoo APPLY 1 APPLICATION ON THE SKIN AS DIRECTED; LATHER AND LET SIT FOR A FEW MINUTES PRIOR TO RINSING WHEN YOU SHOWER. (Patient taking differently: Apply 1 application topically daily.) 120 mL 11   Liraglutide -Weight Management 18 MG/3ML SOPN INJECT 3 MG INTO THE SKIN DAILY. (Patient taking differently: Inject 1.6 mg into the skin daily.) 15 mL 0   metFORMIN (GLUCOPHAGE) 500 MG tablet Take 1 tablet (500 mg total) by mouth in the morning, at noon, and at bedtime. 90 tablet 0   metoprolol tartrate (LOPRESSOR) 50 MG tablet Take 1 tablet (50 mg total) by mouth 2 (two) times daily. 180 tablet 3   spironolactone (ALDACTONE) 25 MG tablet Take 1 tablet (25 mg total) by mouth 3 (three) times a week. 45 tablet 3   Vitamin D, Ergocalciferol, (DRISDOL) 1.25 MG (50000 UNIT) CAPS capsule Take 1 capsule (50,000 Units total) by mouth every 7 (seven) days. 4 capsule 0   No current facility-administered medications for this visit.    Allergies:   Adhesive [tape] and Latex    Social History:  The patient  reports that she has never smoked. She has never used smokeless tobacco. She reports that she does not drink alcohol and does not use drugs.   Family History:  The patient's family history includes Diabetes in her maternal grandmother, mother, paternal grandmother, and sister; Heart disease in her maternal grandfather, maternal grandmother, maternal uncle, and mother; Heart disease (age of onset: 15) in her father; Hypertension in her mother; Liver disease in her paternal aunt; Obesity in her mother;  Ovarian cancer in her maternal grandmother; Sudden death in her father.    ROS:   Noted in current history, otherwise review of systems is negative.  Physical Exam: Blood pressure 128/78, pulse (!) 108, height 5\' 2"  (1.575 m), weight 208 lb 3.2 oz (94.4 kg), SpO2 97 %.  GEN:  moderately obese, middle age female,  diaphoretic HEENT: Normal NECK: No JVD; No carotid bruits LYMPHATICS: No lymphadenopathy CARDIAC: RRR, soft systolic murmur  RESPIRATORY:  Clear to auscultation without rales, wheezing or rhonchi  ABDOMEN: Soft, non-tender, non-distended MUSCULOSKELETAL:  No edema; No deformity  SKIN: Warm and dry NEUROLOGIC:  Alert and oriented x 3   EKG:    May 07, 2021: Sinus tachycardia at 101.  He has no ST or T wave changes.   Recent Labs: 05/29/2020: ALT 11 10/03/2020: TSH 4.040 05/01/2021: BUN 16; Creatinine, Ser 0.72; Hemoglobin 13.7; Platelets 263; Potassium 2.9; Sodium 140    Lipid Panel    Component Value Date/Time   CHOL 261 (H) 05/29/2020 1422   TRIG 197 (H) 05/29/2020 1422   HDL 49 05/29/2020 1422   CHOLHDL 4.4 11/30/2018 1047   CHOLHDL 4.2 12/24/2017 0818   VLDL 22 03/11/2015 0847   LDLCALC 175 (H) 05/29/2020 1422   LDLCALC 134 (H) 12/24/2017 0818      Wt Readings from Last 3 Encounters:  05/07/21 208 lb 3.2 oz (94.4 kg)  05/01/21 208 lb (94.3 kg)  04/03/21 207 lb (93.9 kg)      Other studies Reviewed: Additional studies/ records that were reviewed today include: . Review of the above records demonstrates:    ASSESSMENT AND PLAN:  1.   Syncope: [name] was a seen in the emergency room with an episode of syncope.  She received  IV fluids, IV potassium and oral potassium.  She does not have a potassium of 2.7.  Her potassium levels have been normal for the past several years despite being on the same dose of intermittent Lasix.  She is not changed her diet.  She has not been taking any additional foods or supplements.  It is possible that this was due to  a urinary tract infection or other infection.  Her white count was elevated at the time.  She will be seeing her primary medical doctor for further assessment of noncardiac issues.  We will get a TSH today. I would like to increase the metoprolol to 50 mg twice a day. We will discontinue the Lasix and start her on spironolactone 25 mg on Mondays, Wednesdays, Fridays.  We will check a basic metabolic profile in 3 weeks.  I will see her ( or APP )  in 4 to 6 weeks for follow-up visit.    2.  Chronic diastolic congestive heart failure:   DC lasix due to hypokalemia . Start spironolactone 25 on M,W,F.    Ive advised her to avoid processed mets.     Current medicines are reviewed at length with the patient today.  The patient does not have concerns regarding medicines.  The following changes have been made:  no change  Labs/ tests ordered today include:   Orders Placed This Encounter  Procedures   Basic metabolic panel   CBC   TSH   Basic metabolic panel   EKG 10-XNAT   ECHOCARDIOGRAM COMPLETE      Mertie Moores, MD  05/07/2021 5:27 PM    [facility] [place], [place], [place]  [address] Phone: [phone]; Fax: [phone]

## 2021-05-07 NOTE — Patient Instructions (Signed)
Medication Instructions:  Your physician has recommended you make the following change in your medication:   STOP Lasix START Spironolactone 25mg  three times a week (Monday, Wednesday, Friday) INCREASE Metoprolol to 50mg  twice a day.   *If you need a refill on your cardiac medications before your next appointment, please call your pharmacy*   Lab Work: TODAY: BMP, TSH, CBC  REPEAT BMET IN 2 WEEKS  If you have labs (blood work) drawn today and your tests are completely normal, you will receive your results only by: [place] (if you have MyChart) OR A paper copy in the mail If you have any lab test that is abnormal or we need to change your treatment, we will call you to review the results.   Testing/Procedures: Your physician has requested that you have an echocardiogram. Echocardiography is a painless test that uses sound waves to create images of your heart. It provides your doctor with information about the size and shape of your heart and how well your heart's chambers and valves are working. This procedure takes approximately one hour. There are no restrictions for this procedure.   Follow-Up: At [facility], you and your health needs are our priority.  As part of our continuing mission to provide you with exceptional heart care, we have created designated Provider Care Teams.  These Care Teams include your primary Cardiologist (physician) and Advanced Practice Providers (APPs -  Physician Assistants and Nurse Practitioners) who all work together to provide you with the care you need, when you need it.  We recommend signing up for the patient portal called "MyChart".  Sign up information is provided on this After Visit Summary.  MyChart is used to connect with patients for Virtual Visits (Telemedicine).  Patients are able to view lab/test results, encounter notes, upcoming appointments, etc.  Non-urgent messages can be sent to your provider as well.   To learn more about  what you can do with MyChart, go to [email or web address].    Your next appointment:   4-6 week(s)  The format for your next appointment:   In Person  Provider:   You may see Mertie Moores, MD or one of the following Advanced Practice Providers on your designated Care Team:   Richardson Dopp, PA-C [place], [place]

## 2021-05-08 ENCOUNTER — Other Ambulatory Visit (HOSPITAL_COMMUNITY): Payer: Self-pay

## 2021-05-08 ENCOUNTER — Encounter (INDEPENDENT_AMBULATORY_CARE_PROVIDER_SITE_OTHER): Payer: Self-pay | Admitting: Family Medicine

## 2021-05-08 ENCOUNTER — Ambulatory Visit (INDEPENDENT_AMBULATORY_CARE_PROVIDER_SITE_OTHER): Payer: 59 | Admitting: Family Medicine

## 2021-05-08 VITALS — BP 115/77 | HR 74 | Temp 98.0°F | Ht 63.0 in | Wt 204.0 lb

## 2021-05-08 DIAGNOSIS — E559 Vitamin D deficiency, unspecified: Secondary | ICD-10-CM | POA: Diagnosis not present

## 2021-05-08 DIAGNOSIS — Z9189 Other specified personal risk factors, not elsewhere classified: Secondary | ICD-10-CM | POA: Diagnosis not present

## 2021-05-08 DIAGNOSIS — E7849 Other hyperlipidemia: Secondary | ICD-10-CM

## 2021-05-08 DIAGNOSIS — E8881 Metabolic syndrome: Secondary | ICD-10-CM | POA: Diagnosis not present

## 2021-05-08 DIAGNOSIS — Z6839 Body mass index (BMI) 39.0-39.9, adult: Secondary | ICD-10-CM

## 2021-05-08 LAB — CBC
Hematocrit: 45.6 % (ref 34.0–46.6)
Hemoglobin: 15.3 g/dL (ref 11.1–15.9)
MCH: 28.9 pg (ref 26.6–33.0)
MCHC: 33.6 g/dL (ref 31.5–35.7)
MCV: 86 fL (ref 79–97)
Platelets: 409 x10E3/uL (ref 150–450)
RBC: 5.29 x10E6/uL — ABNORMAL HIGH (ref 3.77–5.28)
RDW: 13.1 % (ref 11.7–15.4)
WBC: 11.3 x10E3/uL — ABNORMAL HIGH (ref 3.4–10.8)

## 2021-05-08 LAB — BASIC METABOLIC PANEL WITH GFR
BUN/Creatinine Ratio: 23 (ref 9–23)
BUN: 17 mg/dL (ref 6–24)
CO2: 24 mmol/L (ref 20–29)
Calcium: 10.5 mg/dL — ABNORMAL HIGH (ref 8.7–10.2)
Chloride: 97 mmol/L (ref 96–106)
Creatinine, Ser: 0.75 mg/dL (ref 0.57–1.00)
Glucose: 94 mg/dL (ref 65–99)
Potassium: 4.2 mmol/L (ref 3.5–5.2)
Sodium: 139 mmol/L (ref 134–144)
eGFR: 99 mL/min/1.73 (ref >59)

## 2021-05-08 LAB — TSH: TSH: 3.82 u[IU]/mL (ref 0.450–4.500)

## 2021-05-08 MED ORDER — VITAMIN D (ERGOCALCIFEROL) 1.25 MG (50000 UNIT) PO CAPS
1.0000 | ORAL_CAPSULE | ORAL | 0 refills | Status: DC
Start: 2021-05-08 — End: 2021-06-05
  Filled 2021-05-08: qty 4, 28d supply, fill #0

## 2021-05-09 ENCOUNTER — Other Ambulatory Visit (HOSPITAL_COMMUNITY): Payer: Self-pay

## 2021-05-09 ENCOUNTER — Other Ambulatory Visit: Payer: Self-pay

## 2021-05-09 ENCOUNTER — Ambulatory Visit: Payer: 59 | Admitting: Family Medicine

## 2021-05-09 ENCOUNTER — Encounter: Payer: Self-pay | Admitting: Family Medicine

## 2021-05-09 VITALS — BP 112/64 | HR 84 | Temp 98.5°F | Resp 16 | Ht 63.0 in | Wt 212.0 lb

## 2021-05-09 DIAGNOSIS — F909 Attention-deficit hyperactivity disorder, unspecified type: Secondary | ICD-10-CM | POA: Diagnosis not present

## 2021-05-09 DIAGNOSIS — R55 Syncope and collapse: Secondary | ICD-10-CM

## 2021-05-09 DIAGNOSIS — F419 Anxiety disorder, unspecified: Secondary | ICD-10-CM | POA: Diagnosis not present

## 2021-05-09 LAB — INSULIN, RANDOM: INSULIN: 22.8 u[IU]/mL (ref 2.6–24.9)

## 2021-05-09 LAB — HEMOGLOBIN A1C
Est. average glucose Bld gHb Est-mCnc: 111 mg/dL
Hgb A1c MFr Bld: 5.5 % (ref 4.8–5.6)

## 2021-05-09 LAB — LIPID PANEL WITH LDL/HDL RATIO
Cholesterol, Total: 223 mg/dL — ABNORMAL HIGH (ref 100–199)
HDL: 51 mg/dL (ref >39)
LDL Chol Calc (NIH): 147 mg/dL — ABNORMAL HIGH (ref 0–99)
LDL/HDL Ratio: 2.9 ratio (ref 0.0–3.2)
Triglycerides: 139 mg/dL (ref 0–149)
VLDL Cholesterol Cal: 25 mg/dL (ref 5–40)

## 2021-05-09 LAB — VITAMIN D 25 HYDROXY (VIT D DEFICIENCY, FRACTURES): Vit D, 25-Hydroxy: 39.3 ng/mL (ref 30.0–100.0)

## 2021-05-09 LAB — VITAMIN B12: Vitamin B-12: >2000 pg/mL — ABNORMAL HIGH (ref 232–1245)

## 2021-05-09 MED ORDER — ESCITALOPRAM OXALATE 10 MG PO TABS
10.0000 mg | ORAL_TABLET | Freq: Every day | ORAL | 3 refills | Status: DC
  Filled 2021-05-09: qty 30, 30d supply, fill #0
  Filled 2021-08-13: qty 30, 30d supply, fill #1

## 2021-05-09 MED ORDER — AMPHETAMINE-DEXTROAMPHET ER 25 MG PO CP24
25.0000 mg | ORAL_CAPSULE | Freq: Every morning | ORAL | 0 refills | Status: DC
  Filled 2021-05-09 – 2021-06-20 (×2): qty 30, 30d supply, fill #0

## 2021-05-09 NOTE — Progress Notes (Signed)
Subjective:    Patient ID: [name], female    DOB: April 21, 1971, 50 y.o.   MRN: [id number]  Patient recently went to the emergency room with a syncopal episode.  She states that she woke up.  She felt nauseated.  She went to the bathroom.  While sitting on the toilet, patient became unresponsive.  Her husband went to check on her and she is reports that she was dazed with her eyes rolled back in her head and her arms were shaking.  She has no recollection of this episode.  He was able to arouse her after 1 minute.  She went to the emergency room where work-up was unremarkable.  She has since seen her cardiologist who increased her metoprolol and switched her from Lasix to spironolactone due to hypokalemia.  They increased her metoprolol due to her elevated resting heart rate.  Of note she is on Adderall for ADD which could only contribute to her high resting heart rate.  She also reports that she is under a tremendous amount of stress recently.  There have been a lot of issues in her family including legal issues with her children.  This has her anxious and under more stress.  She reports feeling anxious and having to use the Xanax more frequently. Past Medical History:  Diagnosis Date   Anxiety    Asthma    Back pain    Chest pain    Chronic CHF (HCC)    Dyspnea    Echocardiogram 10/25/2015   EF 60-65, normal wall motion, grade 1 diastolic dysfunction, GLS -19.1%   Edema of left lower extremity    Fracture dislocation of elbow joint 01/15/2013   right   GERD (gastroesophageal reflux disease)    Omeprazole as needed, none in 6 mos.   Hyperlipidemia    Hypertension    Joint pain    Obesity    Palpitations    Prediabetes    R/L Cardiac catheterization 08/18/2018   Normal coronary arteries, mean RA 6, mean PA 17, PCWP 10   Seborrheic dermatitis    Sleep apnea    uses CPAP "most nights"   Past Surgical History:  Procedure Laterality Date   CESAREAN SECTION  1998; 10/10/2000    x 2   KNEE ARTHROSCOPY Left 1991   also in 2019   [place]  11/02/2006   LAPAROSCOPIC LYSIS OF ADHESIONS  03/06/2005   RADIAL HEAD ARTHROPLASTY Right 01/17/2013   Procedure: RIGHT RADIAL HEAD ARTHROPLASTY OPEN REDUCTION INTERNAL FIXATION CORONOID ;  Surgeon: Tennis Must, MD;  Location: [facility];  Service: Orthopedics;  Laterality: Right;   RIGHT/LEFT HEART CATH AND CORONARY ANGIOGRAPHY N/A 08/18/2018   Procedure: RIGHT/LEFT HEART CATH AND CORONARY ANGIOGRAPHY;  Surgeon: Nelva Bush, MD;  Location: [facility];  Service: Cardiovascular;  Laterality: N/A;   TUBAL LIGATION  03/06/2005   Current Outpatient Medications on File Prior to Visit  Medication Sig Dispense Refill   albuterol (PROVENTIL HFA;VENTOLIN HFA) 108 (90 Base) MCG/ACT inhaler Inhale 2 puffs into the lungs every 6 (six) hours as needed for wheezing. 1 Inhaler 11   ALPRAZolam (XANAX) 0.5 MG tablet Take 1 tablet (0.5 mg total) by mouth 3 (three) times daily as needed. 45 tablet 0   amphetamine-dextroamphetamine (ADDERALL XR) 25 MG 24 hr capsule Take 1 capsule by mouth every morning. 30 capsule 0   clobetasol (TEMOVATE) 0.05 % external solution APPLY 2 TIMES A DAY TO SCALP AS  NEEDED FOR FLARES (Patient taking differently: Apply 1 application topically 2 (two) times daily as needed (flares).) 50 mL 11   ibuprofen (ADVIL,MOTRIN) 200 MG tablet Take 400 mg by mouth 2 (two) times daily as needed for headache or moderate pain.     Insulin Pen Needle 32G X 4 MM MISC USE ONCE DAILY TO INJECT SAXENDA. 100 each 0   ketoconazole (NIZORAL) 2 % shampoo APPLY 1 APPLICATION ON THE SKIN AS DIRECTED; LATHER AND LET SIT FOR A FEW MINUTES PRIOR TO RINSING WHEN YOU SHOWER. (Patient taking differently: Apply 1 application topically daily.) 120 mL 11   Liraglutide -Weight Management 18 MG/3ML SOPN INJECT 3 MG INTO THE SKIN DAILY. (Patient taking differently: Inject 1.6 mg into the skin daily.) 15 mL 0    metFORMIN (GLUCOPHAGE) 500 MG tablet Take 1 tablet (500 mg total) by mouth in the morning, at noon, and at bedtime. 90 tablet 0   metoprolol tartrate (LOPRESSOR) 50 MG tablet Take 1 tablet (50 mg total) by mouth 2 (two) times daily. 180 tablet 3   spironolactone (ALDACTONE) 25 MG tablet Take 1 tablet (25 mg total) by mouth 3 (three) times a week. 45 tablet 3   Vitamin D, Ergocalciferol, (DRISDOL) 1.25 MG (50000 UNIT) CAPS capsule Take 1 capsule (50,000 Units total) by mouth every 7 (seven) days. 4 capsule 0   No current facility-administered medications on file prior to visit.   Allergies  Allergen Reactions   Adhesive [Tape] Rash   Latex Rash   Social History   Socioeconomic History   Marital status: Married    Spouse name: Kimberlly Norgard   Number of children: 2   Years of education: Not on file   Highest education level: Not on file  Occupational History   Occupation: CMA    Employer: [employer]  Tobacco Use   Smoking status: Never   Smokeless tobacco: Never  Substance and Sexual Activity   Alcohol use: No   Drug use: No   Sexual activity: Not on file  Other Topics Concern   Not on file  Social History Narrative   Not on file   Social Determinants of Health   Financial Resource Strain: Not on file  Food Insecurity: Not on file  Transportation Needs: Not on file  Physical Activity: Not on file  Stress: Not on file  Social Connections: Not on file  Intimate Partner Violence: Not on file     Review of Systems  All other systems reviewed and are negative.     Objective:   Physical Exam Vitals reviewed.  Constitutional:      Appearance: She is obese.  HENT:     Right Ear: Tympanic membrane and ear canal normal.     Left Ear: Tympanic membrane and ear canal normal.     Nose: Nose normal.  Cardiovascular:     Rate and Rhythm: Normal rate and regular rhythm.     Pulses: Normal pulses.     Heart sounds: Normal heart sounds. No murmur heard.   No friction  rub. No gallop.  Pulmonary:     Effort: No accessory muscle usage, prolonged expiration or respiratory distress.     Breath sounds: No stridor. No wheezing, rhonchi or rales.  Chest:     Chest wall: No tenderness.  Abdominal:     General: Bowel sounds are normal. There is no distension.     Palpations: Abdomen is soft.     Tenderness: There is no abdominal tenderness. There  is no right CVA tenderness, left CVA tenderness, guarding or rebound.  Musculoskeletal:     Right lower leg: No edema.     Left lower leg: No edema.  Neurological:     Mental Status: She is alert.        Assessment & Plan:  Vasovagal attack  Anxiety  Attention deficit hyperactivity disorder (ADHD), unspecified ADHD type Patient reports to being under more stress.  The morning that she woke up she felt nauseated.  She felt weak.  She felt lightheaded.  I believe that she was having a vasovagal event and then due to cerebral hypoperfusion, the patient had a syncopal episode and also seizure activity related to that.  We discussed a referral to neurology for an EEG however after discussion, the patient feels that she most likely had a vasovagal attack which I agree with.  I explained to the patient that metoprolol could increase her heart rate potentially by being a stimulant.  Therefore I would like to decrease the dose however the patient states that without that medication she would be under even more stressed because she is unable to get her job done due to inability to focus.  She would like to leave the Adderall at his current dose but she is interested in medication to perhaps help manage her anxiety.  Therefore we will start Lexapro 10 mg a day and then reassess in 4 to 6 weeks.  I reviewed her lab work from the 22nd which included a CMP with a normal potassium level of 4.2, normal TSH, a CBC that showed no evidence of anemia, normal vitamin D and B12, normal A1c, but an elevated cholesterol.  We discussed this at  length.

## 2021-05-14 NOTE — Progress Notes (Signed)
Chief Complaint:   OBESITY [name] is here to discuss her progress with her obesity treatment plan along with follow-up of her obesity related diagnoses. [name] is on the Category 3 Plan and states she is following her eating plan approximately 80% of the time. [name] states she is doing 0 minutes 0 times per week.  Today's visit was #: 28 Starting weight: 235 lbs Starting date: 11/30/2018 Today's weight: 204 lbs Today's date: 05/08/2021 Total lbs lost to date: 31 Total lbs lost since last in-office visit: 3  Interim History: [name] has had a lot of stress since her last visit. She was in the emergency department for loss of consciousness, and she has a very low K+. She has family stressors with her son, but she has still managed to eat healthy for the most part.  Subjective:   1. Vitamin D deficiency Na is due for labs. She is on Vit D prescription and she is at risk of over-replacement.   2. Insulin resistance [name] is stable on metformin, and she is due for labs. She continues to do well with diet and weight loss.  3. Other hyperlipidemia [name] is working on diet, and she is not on statin. She is due for labs.  4. At risk for dehydration [name] is at risk for dehydration due to heat.  Assessment/Plan:   1. Vitamin D deficiency Low Vitamin D level contributes to fatigue and are associated with obesity, breast, and colon cancer. We will refill prescription Vitamin D for 1 month. We will check labs today and [name] will follow-up for routine testing of Vitamin D, at least 2-3 times per year to avoid over-replacement.  - Vitamin D, Ergocalciferol, (DRISDOL) 1.25 MG (50000 UNIT) CAPS capsule; Take 1 capsule (50,000 Units total) by mouth every 7 (seven) days.  Dispense: 4 capsule; Refill: 0 - VITAMIN D 25 Hydroxy (Vit-D Deficiency, Fractures)  2. Insulin resistance [name] will continue metformin, and will continue to work on weight loss, exercise, and  decreasing simple carbohydrates to help decrease the risk of diabetes. We will check labs today. [name] agreed to follow-up with [name] as directed to closely monitor her progress.  - Insulin, random - Hemoglobin A1c - Vitamin B12  3. Other hyperlipidemia Cardiovascular risk and specific lipid/LDL goals reviewed. We discussed several lifestyle modifications today. [name] will continue to work on diet, exercise and weight loss efforts. We will check labs today. Orders and follow up as documented in patient record.   - Lipid Panel With LDL/HDL Ratio  4. At risk for dehydration [name] was given approximately 15 minutes dehydration prevention counseling today. [name] is at risk for dehydration due to weight loss and current medication(s). She was encouraged to hydrate and monitor fluid status to avoid dehydration as well as weight loss plateaus.   5. Obesity with current BMI 36.2 [name] is currently in the action stage of change. As such, her goal is to continue with weight loss efforts. She has agreed to the Category 3 Plan.   Behavioral modification strategies: increasing water intake.  [name] has agreed to follow-up with our clinic in 4 weeks. She was informed of the importance of frequent follow-up visits to maximize her success with intensive lifestyle modifications for her multiple health conditions.   Objective:   Blood pressure 115/77, pulse 74, temperature 98 F (36.7 C), height 5\' 3"  (1.6 m), weight 204 lb (92.5 kg), SpO2 97 %. Body mass index is 36.14 kg/m.  General: Cooperative, alert, well developed, in  no acute distress. HEENT: Conjunctivae and lids unremarkable. Cardiovascular: Regular rhythm.  Lungs: Normal work of breathing. Neurologic: No focal deficits.   Lab Results  Component Value Date   CREATININE 0.75 05/07/2021   BUN 17 05/07/2021   NA 139 05/07/2021   K 4.2 05/07/2021   CL 97 05/07/2021   CO2 24 05/07/2021   Lab Results  Component Value Date   ALT  11 05/29/2020   AST 13 05/29/2020   ALKPHOS 73 05/29/2020   BILITOT 0.3 05/29/2020   Lab Results  Component Value Date   HGBA1C 5.5 05/08/2021   HGBA1C 5.6 05/29/2020   HGBA1C 5.4 11/27/2019   HGBA1C 5.4 06/27/2019   HGBA1C 6.0 (H) 11/30/2018   Lab Results  Component Value Date   INSULIN 22.8 05/08/2021   INSULIN 15.2 05/29/2020   INSULIN 10.3 11/27/2019   INSULIN 12.5 06/27/2019   INSULIN 14.5 11/30/2018   Lab Results  Component Value Date   TSH 3.820 05/07/2021   Lab Results  Component Value Date   CHOL 223 (H) 05/08/2021   HDL 51 05/08/2021   LDLCALC 147 (H) 05/08/2021   TRIG 139 05/08/2021   CHOLHDL 4.4 11/30/2018   Lab Results  Component Value Date   VD25OH 39.3 05/08/2021   VD25OH 37.5 05/29/2020   VD25OH 31.5 11/27/2019   Lab Results  Component Value Date   WBC 11.3 (H) 05/07/2021   HGB 15.3 05/07/2021   HCT 45.6 05/07/2021   MCV 86 05/07/2021   PLT 409 05/07/2021   No results found for: IRON, TIBC, FERRITIN  Attestation Statements:   Reviewed by clinician on day of visit: allergies, medications, problem list, medical history, surgical history, family history, social history, and previous encounter notes.   I, Trixie Dredge, am acting as transcriptionist for Dennard Nip, MD.  I have reviewed the above documentation for accuracy and completeness, and I agree with the above. -  Dennard Nip, MD

## 2021-05-15 ENCOUNTER — Telehealth (INDEPENDENT_AMBULATORY_CARE_PROVIDER_SITE_OTHER): Payer: Self-pay | Admitting: Emergency Medicine

## 2021-05-15 ENCOUNTER — Other Ambulatory Visit (HOSPITAL_COMMUNITY): Payer: Self-pay

## 2021-05-15 NOTE — Telephone Encounter (Signed)
Prior Auth: Approval: Saxenda  REF: [id number]  Approval for max of 12 from 05/15/21 to 05/14/22

## 2021-05-15 NOTE — Telephone Encounter (Signed)
Prior Auth: Initiated: saxenda  (KeyHaynes Hoehn) Saxenda 18MG /3ML pen-injectors

## 2021-05-21 ENCOUNTER — Other Ambulatory Visit: Payer: 59

## 2021-05-30 ENCOUNTER — Other Ambulatory Visit: Payer: 59 | Admitting: *Deleted

## 2021-05-30 ENCOUNTER — Other Ambulatory Visit: Payer: Self-pay

## 2021-05-30 ENCOUNTER — Ambulatory Visit (HOSPITAL_COMMUNITY): Payer: 59 | Attending: Cardiovascular Disease

## 2021-05-30 DIAGNOSIS — R55 Syncope and collapse: Secondary | ICD-10-CM

## 2021-05-30 LAB — ECHOCARDIOGRAM COMPLETE
Area-P 1/2: 1.81 cm2
S' Lateral: 2.3 cm

## 2021-05-30 LAB — BASIC METABOLIC PANEL
BUN/Creatinine Ratio: 14 (ref 9–23)
BUN: 10 mg/dL (ref 6–24)
CO2: 23 mmol/L (ref 20–29)
Calcium: 9.5 mg/dL (ref 8.7–10.2)
Chloride: 99 mmol/L (ref 96–106)
Creatinine, Ser: 0.74 mg/dL (ref 0.57–1.00)
Glucose: 90 mg/dL (ref 65–99)
Potassium: 4.2 mmol/L (ref 3.5–5.2)
Sodium: 135 mmol/L (ref 134–144)
eGFR: 100 mL/min/{1.73_m2} (ref >59)

## 2021-05-30 NOTE — Progress Notes (Signed)
DPR ok to leave message. Left message per Dr. Acie Fredrickson lab work stable. If any questions please call [phone].

## 2021-06-03 ENCOUNTER — Other Ambulatory Visit (HOSPITAL_COMMUNITY): Payer: Self-pay

## 2021-06-03 ENCOUNTER — Telehealth: Payer: Self-pay

## 2021-06-03 MED ORDER — FLUCONAZOLE 150 MG PO TABS
150.0000 mg | ORAL_TABLET | ORAL | 1 refills | Status: DC
  Filled 2021-06-03: qty 5, 15d supply, fill #0

## 2021-06-03 NOTE — Telephone Encounter (Signed)
Requests Diflucan Rx to [facility].

## 2021-06-03 NOTE — Addendum Note (Signed)
Addended by: Hortencia Pilar on: 06/03/2021 04:26 PM   Modules accepted: Orders

## 2021-06-05 ENCOUNTER — Ambulatory Visit (INDEPENDENT_AMBULATORY_CARE_PROVIDER_SITE_OTHER): Payer: 59 | Admitting: Family Medicine

## 2021-06-05 ENCOUNTER — Encounter (INDEPENDENT_AMBULATORY_CARE_PROVIDER_SITE_OTHER): Payer: Self-pay | Admitting: Family Medicine

## 2021-06-05 ENCOUNTER — Other Ambulatory Visit: Payer: Self-pay

## 2021-06-05 VITALS — BP 112/68 | HR 82 | Temp 98.0°F | Ht 63.0 in | Wt 207.0 lb

## 2021-06-05 DIAGNOSIS — E559 Vitamin D deficiency, unspecified: Secondary | ICD-10-CM | POA: Diagnosis not present

## 2021-06-05 DIAGNOSIS — Z9189 Other specified personal risk factors, not elsewhere classified: Secondary | ICD-10-CM

## 2021-06-05 DIAGNOSIS — Z6841 Body Mass Index (BMI) 40.0 and over, adult: Secondary | ICD-10-CM | POA: Diagnosis not present

## 2021-06-05 DIAGNOSIS — R7303 Prediabetes: Secondary | ICD-10-CM | POA: Diagnosis not present

## 2021-06-10 ENCOUNTER — Other Ambulatory Visit (INDEPENDENT_AMBULATORY_CARE_PROVIDER_SITE_OTHER): Payer: Self-pay | Admitting: Family Medicine

## 2021-06-10 ENCOUNTER — Other Ambulatory Visit (HOSPITAL_COMMUNITY): Payer: Self-pay

## 2021-06-10 DIAGNOSIS — E559 Vitamin D deficiency, unspecified: Secondary | ICD-10-CM

## 2021-06-10 DIAGNOSIS — Z6841 Body Mass Index (BMI) 40.0 and over, adult: Secondary | ICD-10-CM

## 2021-06-11 ENCOUNTER — Other Ambulatory Visit (HOSPITAL_COMMUNITY): Payer: Self-pay

## 2021-06-11 NOTE — Telephone Encounter (Signed)
Last OV with Dr. Beasley 

## 2021-06-12 NOTE — Progress Notes (Signed)
Chief Complaint:   OBESITY [name] is here to discuss her progress with her obesity treatment plan along with follow-up of her obesity related diagnoses. [name] is on the Category 3 Plan and states she is following her eating plan approximately 85% of the time. [name] states she is doing 0 minutes 0 times per week.  Today's visit was #: 65 Starting weight: 235 lbs Starting date: 11/30/2018 Today's weight: 207 lbs Today's date: 06/05/2021 Total lbs lost to date: 28 Total lbs lost since last in-office visit: 5  Interim History: [name] continues to do well with weight loss, but she notes some increased nausea and headache and she wonders if this is due to the [place].  Subjective:   1. Vitamin D deficiency [name] is stable on Vit D, and she denies nausea, vomiting, or muscle weakness.  2. Pre-diabetes [name] is stable on metformin. She has been on TID but decreased to BID when nausea increases but with no improvement.   3. At risk for impaired metabolic function [name] is at increased risk for impaired metabolic function if calories and protein goes too low.  Assessment/Plan:   1. Vitamin D deficiency Low Vitamin D level contributes to fatigue and are associated with obesity, breast, and colon cancer. We will refill prescription Vitamin D for 1 month. [name] will follow-up for routine testing of Vitamin D, at least 2-3 times per year to avoid over-replacement.  2. Pre-diabetes [name] will continue metformin BID, and she will make sure to take it with food. She will continue to work on weight loss, exercise, and decreasing simple carbohydrates to help decrease the risk of diabetes.   3. At risk for impaired metabolic function [name] was given approximately 15 minutes of impaired  metabolic function prevention counseling today. We discussed intensive lifestyle modifications today with an emphasis on specific nutrition and exercise instructions and strategies.    Repetitive spaced learning was employed today to elicit superior memory formation and behavioral change.  4. Obesity with current BMI of 36.7 [name] is currently in the action stage of change. As such, her goal is to continue with weight loss efforts. She has agreed to the Category 3 Plan.   We discussed various medication options to help [name] with her weight loss efforts and we both agreed to continue Saxenda 3 mg daily, and we will refill for 1 month, and refill nano needles #100 with no refills.  [name] is to make sure she takes her Saxenda with food and doesn't go too long in between meals. If this doesn't help she can decrease her dose to 1.2 mg and see if this improves her nausea.  Behavioral modification strategies: increasing lean protein intake, decreasing simple carbohydrates, increasing water intake, and no skipping meals.  [name] has agreed to follow-up with our clinic in 4 weeks. She was informed of the importance of frequent follow-up visits to maximize her success with intensive lifestyle modifications for her multiple health conditions.   Objective:   Blood pressure 112/68, pulse 82, temperature 98 F (36.7 C), height '5\' 3"'$  (1.6 m), weight 207 lb (93.9 kg), SpO2 97 %. Body mass index is 36.67 kg/m.  General: Cooperative, alert, well developed, in no acute distress. HEENT: Conjunctivae and lids unremarkable. Cardiovascular: Regular rhythm.  Lungs: Normal work of breathing. Neurologic: No focal deficits.   Lab Results  Component Value Date   CREATININE 0.74 05/30/2021   BUN 10 05/30/2021   NA 135 05/30/2021   K 4.2 05/30/2021   CL  99 05/30/2021   CO2 23 05/30/2021   Lab Results  Component Value Date   ALT 11 05/29/2020   AST 13 05/29/2020   ALKPHOS 73 05/29/2020   BILITOT 0.3 05/29/2020   Lab Results  Component Value Date   HGBA1C 5.5 05/08/2021   HGBA1C 5.6 05/29/2020   HGBA1C 5.4 11/27/2019   HGBA1C 5.4 06/27/2019   HGBA1C 6.0 (H) 11/30/2018    Lab Results  Component Value Date   INSULIN 22.8 05/08/2021   INSULIN 15.2 05/29/2020   INSULIN 10.3 11/27/2019   INSULIN 12.5 06/27/2019   INSULIN 14.5 11/30/2018   Lab Results  Component Value Date   TSH 3.820 05/07/2021   Lab Results  Component Value Date   CHOL 223 (H) 05/08/2021   HDL 51 05/08/2021   LDLCALC 147 (H) 05/08/2021   TRIG 139 05/08/2021   CHOLHDL 4.4 11/30/2018   Lab Results  Component Value Date   VD25OH 39.3 05/08/2021   VD25OH 37.5 05/29/2020   VD25OH 31.5 11/27/2019   Lab Results  Component Value Date   WBC 11.3 (H) 05/07/2021   HGB 15.3 05/07/2021   HCT 45.6 05/07/2021   MCV 86 05/07/2021   PLT 409 05/07/2021   No results found for: IRON, TIBC, FERRITIN  Attestation Statements:   Reviewed by clinician on day of visit: allergies, medications, problem list, medical history, surgical history, family history, social history, and previous encounter notes.   I, Trixie Dredge, am acting as transcriptionist for Dennard Nip, MD.  I have reviewed the above documentation for accuracy and completeness, and I agree with the above. -  Dennard Nip, MD

## 2021-06-13 ENCOUNTER — Other Ambulatory Visit (INDEPENDENT_AMBULATORY_CARE_PROVIDER_SITE_OTHER): Payer: Self-pay | Admitting: Family Medicine

## 2021-06-13 ENCOUNTER — Other Ambulatory Visit (HOSPITAL_COMMUNITY): Payer: Self-pay

## 2021-06-13 DIAGNOSIS — E559 Vitamin D deficiency, unspecified: Secondary | ICD-10-CM

## 2021-06-16 ENCOUNTER — Other Ambulatory Visit (HOSPITAL_COMMUNITY): Payer: Self-pay

## 2021-06-16 ENCOUNTER — Encounter (INDEPENDENT_AMBULATORY_CARE_PROVIDER_SITE_OTHER): Payer: Self-pay | Admitting: Emergency Medicine

## 2021-06-16 NOTE — Telephone Encounter (Signed)
Msg sent Via [employer]

## 2021-06-16 NOTE — Progress Notes (Signed)
Cardiology Office Note:    Date:  06/17/2021   ID:  [name] [id number], MRN [id number]  PCP:  Susy Frizzle, MD   [facility] Providers Cardiologist:  Mertie Moores, MD Cardiology APP:  Sharmon Revere      Referring MD: Susy Frizzle, MD   Chief Complaint:  Follow-up (CHF, HTN, low K+)    Patient Profile:    [name] is a 50 y.o. female with:  (HFpEF) heart failure with preserved ejection fraction  Sinus tachycardia due to ADHD Hypertension  OSA ADHD Normal coronary arteries by cath in 2019 Obesity Palpitations    Prior CV studies: Echocardiogram 05/30/21 EF 65-70, no RWMA, normal RVSF  NON-TELEMETRY MONITORING-INTERPRETATION ONLY 11/19/2020 Narrative  Sinus rhythm  Occasional PVCs  RIGHT/LEFT HEART CATH AND CORONARY ANGIOGRAPHY 08/18/2018 Narrative Conclusions: 1. No angiographically significant coronary artery disease. 2. Normal left heart, right heart, and pulmonary artery pressures. 3. Normal LVEF and Fick cardiac output. 4. 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  Consider alternate access for future procedures.  Recommendations. 1. Primary prevention of coronary artery disease. 2. Increase metoprolol tartrate to 100 mg twice daily given sinus tachycardia and hypertension at baseline. 3. Consider evaluation for noncardiac causes of symptoms.   Echo 10/25/2015 EF 60-65, normal wall motion, grade 1 diastolic dysfunction, GLS -19.1%   History of Present Illness: [name] was last seen by Dr. Acie Fredrickson in 6/22 after a visit to the ED with syncope in the setting of hypokalemia.   Her furosemide was DC'd and she was placed on Spironolactone.  An echocardiogram was obtained and demonstrated normal EF.  She returns for f/u.  She is here alone.  Overall, her breathing is stable.  She has not had chest discomfort or further syncope.  She has not had orthopnea.   She has chronic swelling in her left leg.  This seems to worsen when she is on her feet for prolonged periods and improves with elevation.  She also notes improvement on the day she takes spironolactone.  She has noted that she does not have increased urination with spironolactone until later date.  We discussed possibly changing spironolactone to at bedtime to see if this helps.       Past Medical History:  Diagnosis Date   Anxiety    Asthma    Back pain    Chest pain    Chronic CHF (HCC)    Dyspnea    Echocardiogram 10/25/2015   EF 60-65, normal wall motion, grade 1 diastolic dysfunction, GLS -19.1%   Edema of left lower extremity    Fracture dislocation of elbow joint 01/15/2013   right   GERD (gastroesophageal reflux disease)    Omeprazole as needed, none in 6 mos.   Hyperlipidemia    Hypertension    Joint pain    Obesity    Palpitations    Prediabetes    R/L Cardiac catheterization 08/18/2018   Normal coronary arteries, mean RA 6, mean PA 17, PCWP 10   Seborrheic dermatitis    Sleep apnea    uses CPAP "most nights"    Current Medications: Current Meds  Medication Sig   albuterol (PROVENTIL HFA;VENTOLIN HFA) 108 (90 Base) MCG/ACT inhaler Inhale 2 puffs into the lungs every 6 (six) hours as needed for wheezing.   ALPRAZolam (XANAX) 0.5 MG tablet Take 1 tablet (0.5 mg total) by mouth 3 (three) times daily as needed.   amphetamine-dextroamphetamine (  ADDERALL XR) 25 MG 24 hr capsule Take 1 capsule by mouth every morning.   clobetasol (TEMOVATE) 0.05 % external solution APPLY 2 TIMES A DAY TO SCALP AS NEEDED FOR FLARES   escitalopram (LEXAPRO) 10 MG tablet Take 1 tablet (10 mg total) by mouth daily.   fluconazole (DIFLUCAN) 150 MG tablet Take 150 mg by mouth as needed (for infection).   ibuprofen (ADVIL,MOTRIN) 200 MG tablet Take 400 mg by mouth 2 (two) times daily as needed for headache or moderate pain.   Insulin Pen Needle 32G X 4 MM MISC USE ONCE DAILY TO INJECT SAXENDA.    ketoconazole (NIZORAL) 2 % shampoo APPLY 1 APPLICATION ON THE SKIN AS DIRECTED; LATHER AND LET SIT FOR A FEW MINUTES PRIOR TO RINSING WHEN YOU SHOWER.   Liraglutide -Weight Management 18 MG/3ML SOPN Inject 1.6 mg into the skin daily.   metFORMIN (GLUCOPHAGE) 500 MG tablet Take 1 tablet (500 mg total) by mouth in the morning, at noon, and at bedtime.   metoprolol tartrate (LOPRESSOR) 50 MG tablet Take 1 tablet (50 mg total) by mouth 2 (two) times daily.   spironolactone (ALDACTONE) 25 MG tablet Take 1 tablet (25 mg total) by mouth 3 (three) times a week.   Vitamin D, Ergocalciferol, (DRISDOL) 1.25 MG (50000 UNIT) CAPS capsule Take 1 capsule (50,000 Units total) by mouth every 7 (seven) days.     Allergies:   Adhesive [tape] and Latex   Social History   Tobacco Use   Smoking status: Never   Smokeless tobacco: Never  Substance Use Topics   Alcohol use: No   Drug use: No     Family Hx: The patient's family history includes Diabetes in her maternal grandmother, mother, paternal grandmother, and sister; Heart disease in her maternal grandfather, maternal grandmother, maternal uncle, and mother; Heart disease (age of onset: 17) in her father; Hypertension in her mother; Liver disease in her paternal aunt; Obesity in her mother; Ovarian cancer in her maternal grandmother; Sudden death in her father.  ROS   EKGs/Labs/Other Test Reviewed:    EKG:  EKG is not ordered today.  The ekg ordered today demonstrates n/a  Recent Labs: 05/07/2021: Hemoglobin 15.3; Platelets 409; TSH 3.820 05/30/2021: BUN 10; Creatinine, Ser 0.74; Potassium 4.2; Sodium 135   Recent Lipid Panel Lab Results  Component Value Date/Time   CHOL 223 (H) 05/08/2021 09:25 AM   TRIG 139 05/08/2021 09:25 AM   HDL 51 05/08/2021 09:25 AM   LDLCALC 147 (H) 05/08/2021 09:25 AM   LDLCALC 134 (H) 12/24/2017 08:18 AM      Risk Assessment/Calculations:      Physical Exam:    VS:  BP 120/70   Pulse 80   Ht 5' 2.5" (1.588 m)    Wt 211 lb 9.6 oz (96 kg)   SpO2 98%   BMI 38.09 kg/m     Wt Readings from Last 3 Encounters:  06/17/21 211 lb 9.6 oz (96 kg)  06/05/21 207 lb (93.9 kg)  05/09/21 212 lb (96.2 kg)     Constitutional:      Appearance: Healthy appearance. Not in distress.  Neck:     Vascular: JVD normal.  Pulmonary:     Effort: Pulmonary effort is normal.     Breath sounds: No wheezing. No rales.  Cardiovascular:     Normal rate. Regular rhythm. Normal S1. Normal S2.      Murmurs: There is no murmur.  Edema:    Peripheral edema present.  Ankle: trace edema of the left ankle. Abdominal:     Palpations: Abdomen is soft.  Skin:    General: Skin is warm and dry.  Neurological:     Mental Status: Alert and oriented to person, place and time.     Cranial Nerves: Cranial nerves are intact.     ASSESSMENT & PLAN:    1. Chronic heart failure with preserved ejection fraction ([place]) Echocardiogram 7/22 with normal EF.  Volume status stable on spironolactone 3 x per week.  She does note that her L leg edema improves on the days she takes Spironolactone.  Her edema seems to mainly be due to venous insufficiency.  I have suggested she wear compression stockings to help with this along with leg elevation.  She could try to change her Spironolactone to 12.5 mg once daily.  She will continue the current dose for now.  If she changes to the daily dose, she knows to call so that we can repeat a BMET a week later.  F/u in 6 mos.   2. Essential hypertension The patient's blood pressure is controlled on her current regimen.  Continue current dose of metoprolol succinate, spironolactone.    3. Hypokalemia Resolved on Spironolactone.    4. Sinus tachycardia Her HR is improved on higher dose beta-blocker.  Continue current dose of Metoprolol.    Dispo:  Return in about 6 months (around 12/18/2021) for Routine Follow Up, w/ Dr. Acie Fredrickson, or Richardson Dopp, PA-C.   Medication Adjustments/Labs and Tests  Ordered: Current medicines are reviewed at length with the patient today.  Concerns regarding medicines are outlined above.  Tests Ordered: No orders of the defined types were placed in this encounter.  Medication Changes: No orders of the defined types were placed in this encounter.   Signed, Richardson Dopp, PA-C  06/17/2021 12:30 PM    [facility] [place], [place], [place]  [address] Phone: [phone]; Fax: [phone]

## 2021-06-17 ENCOUNTER — Ambulatory Visit: Payer: 59 | Admitting: Physician Assistant

## 2021-06-17 ENCOUNTER — Other Ambulatory Visit (HOSPITAL_COMMUNITY): Payer: Self-pay

## 2021-06-17 ENCOUNTER — Other Ambulatory Visit: Payer: Self-pay

## 2021-06-17 ENCOUNTER — Other Ambulatory Visit (INDEPENDENT_AMBULATORY_CARE_PROVIDER_SITE_OTHER): Payer: Self-pay | Admitting: Family Medicine

## 2021-06-17 ENCOUNTER — Encounter: Payer: Self-pay | Admitting: Physician Assistant

## 2021-06-17 VITALS — BP 120/70 | HR 80 | Ht 62.5 in | Wt 211.6 lb

## 2021-06-17 DIAGNOSIS — E559 Vitamin D deficiency, unspecified: Secondary | ICD-10-CM

## 2021-06-17 DIAGNOSIS — R002 Palpitations: Secondary | ICD-10-CM

## 2021-06-17 DIAGNOSIS — I5032 Chronic diastolic (congestive) heart failure: Secondary | ICD-10-CM

## 2021-06-17 DIAGNOSIS — R000 Tachycardia, unspecified: Secondary | ICD-10-CM | POA: Diagnosis not present

## 2021-06-17 DIAGNOSIS — E876 Hypokalemia: Secondary | ICD-10-CM | POA: Diagnosis not present

## 2021-06-17 DIAGNOSIS — I10 Essential (primary) hypertension: Secondary | ICD-10-CM | POA: Diagnosis not present

## 2021-06-17 MED ORDER — VITAMIN D (ERGOCALCIFEROL) 1.25 MG (50000 UNIT) PO CAPS
50000.0000 [IU] | ORAL_CAPSULE | ORAL | 0 refills | Status: DC
  Filled 2021-06-17: qty 4, 28d supply, fill #0

## 2021-06-17 MED ORDER — LIRAGLUTIDE -WEIGHT MANAGEMENT 18 MG/3ML ~~LOC~~ SOPN
3.0000 mg | PEN_INJECTOR | Freq: Every day | SUBCUTANEOUS | 0 refills | Status: DC
  Filled 2021-06-17: qty 15, 30d supply, fill #0

## 2021-06-17 MED ORDER — INSULIN PEN NEEDLE 32G X 4 MM MISC
0 refills | Status: AC
  Filled 2021-06-17: qty 100, 90d supply, fill #0

## 2021-06-17 NOTE — Telephone Encounter (Signed)
LAST APPOINTMENT DATE: 06/05/21 NEXT APPOINTMENT DATE: 07/07/21   [facility] [address] [place] [address] Phone: [phone] Fax: [phone]  Patient is requesting a refill of the following medications: Requested Prescriptions   Pending Prescriptions Disp Refills   Insulin Pen Needle (UNIFINE PENTIPS) 32G X 4 MM MISC 100 each 0    Sig: USE ONCE DAILY TO INJECT SAXENDA.   Liraglutide -Weight Management (SAXENDA) 18 MG/3ML SOPN 15 mL 0    Sig: INJECT 3 MG INTO THE SKIN DAILY.   Vitamin D, Ergocalciferol, (DRISDOL) 1.25 MG (50000 UNIT) CAPS capsule 4 capsule 0    Sig: Take 1 capsule (50,000 Units total) by mouth every 7 (seven) days.    Date last filled: 02/06/21 Previously prescribed by [facility]  Lab Results  Component Value Date   HGBA1C 5.5 05/08/2021   HGBA1C 5.6 05/29/2020   HGBA1C 5.4 11/27/2019   Lab Results  Component Value Date   LDLCALC 147 (H) 05/08/2021   CREATININE 0.74 05/30/2021   Lab Results  Component Value Date   VD25OH 39.3 05/08/2021   VD25OH 37.5 05/29/2020   VD25OH 31.5 11/27/2019    BP Readings from Last 3 Encounters:  06/05/21 112/68  05/09/21 112/64  05/08/21 115/77

## 2021-06-17 NOTE — Telephone Encounter (Signed)
Dr.Beasley 

## 2021-06-17 NOTE — Patient Instructions (Signed)
Medication Instructions:   Your physician recommends that you continue on your current medications as directed. Please refer to the Current Medication list given to you today.  *If you need a refill on your cardiac medications before your next appointment, please call your pharmacy*  Lab Work:  -None  If you have labs (blood work) drawn today and your tests are completely normal, you will receive your results only by: [place] (if you have MyChart) OR A paper copy in the mail If you have any lab test that is abnormal or we need to change your treatment, we will call you to review the results.  Testing/Procedures:  -None  Follow-Up: At [facility], you and your health needs are our priority.  As part of our continuing mission to provide you with exceptional heart care, we have created designated Provider Care Teams.  These Care Teams include your primary Cardiologist (physician) and Advanced Practice Providers (APPs -  Physician Assistants and Nurse Practitioners) who all work together to provide you with the care you need, when you need it.  We recommend signing up for the patient portal called "MyChart".  Sign up information is provided on this After Visit Summary.  MyChart is used to connect with patients for Virtual Visits (Telemedicine).  Patients are able to view lab/test results, encounter notes, upcoming appointments, etc.  Non-urgent messages can be sent to your provider as well.   To learn more about what you can do with MyChart, go to [email or web address].    Your next appointment:   6 month(s) with Dr. Acie Fredrickson on Wednesday, February 15 @ 4:20 pm.   The format for your next appointment:   In Person  Provider:   Mertie Moores, MD   Other Instructions Please get over the counter compression stockings knee highs for swelling.

## 2021-06-20 ENCOUNTER — Other Ambulatory Visit (HOSPITAL_COMMUNITY): Payer: Self-pay

## 2021-07-07 ENCOUNTER — Encounter (INDEPENDENT_AMBULATORY_CARE_PROVIDER_SITE_OTHER): Payer: Self-pay | Admitting: Family Medicine

## 2021-07-07 ENCOUNTER — Ambulatory Visit (INDEPENDENT_AMBULATORY_CARE_PROVIDER_SITE_OTHER): Payer: 59 | Admitting: Family Medicine

## 2021-07-07 ENCOUNTER — Other Ambulatory Visit: Payer: Self-pay

## 2021-07-07 VITALS — BP 116/73 | HR 72 | Temp 97.9°F | Ht 63.0 in | Wt 208.0 lb

## 2021-07-07 DIAGNOSIS — Z9189 Other specified personal risk factors, not elsewhere classified: Secondary | ICD-10-CM

## 2021-07-07 DIAGNOSIS — E559 Vitamin D deficiency, unspecified: Secondary | ICD-10-CM | POA: Diagnosis not present

## 2021-07-07 DIAGNOSIS — Z6841 Body Mass Index (BMI) 40.0 and over, adult: Secondary | ICD-10-CM

## 2021-07-07 DIAGNOSIS — R7303 Prediabetes: Secondary | ICD-10-CM | POA: Diagnosis not present

## 2021-07-07 DIAGNOSIS — I10 Essential (primary) hypertension: Secondary | ICD-10-CM

## 2021-07-07 NOTE — Progress Notes (Signed)
Chief Complaint:   OBESITY [name] is here to discuss her progress with her obesity treatment plan along with follow-up of her obesity related diagnoses. [name] is on the Category 3 Plan and states she is following her eating plan approximately 90% of the time. [name] states she is walking 1-1.5 miles for 20-25 minutes 5 times per week.  Today's visit was #: 73 Starting weight: 235 lbs Starting date: 11/30/2018 Today's weight: 208 lbs Today's date: 07/07/2021 Total lbs lost to date: 27 Total lbs lost since last in-office visit: 0  Interim History: [name] is retaining some water weight. She is on spironolactone by her Cardiology. She notes work has been stressful and she is still dealing with stress with her son which has contributed to some comfort eating.  Subjective:   1. Pre-diabetes [name] continues to do well with diet, and she is tolerating her medications well.  2. Vitamin D deficiency [name] is stable on Vit D, and she has no signs of over-replacement or other side effects.  3. Essential hypertension [name]'s blood pressure is well controlled today. She is working on diet and exercise.  4. At risk for dehydration [name] is at risk for dehydration due to continued weight loss and medications.  Assessment/Plan:   1. Pre-diabetes [name] will continue Saxenda as is, and we will refill metformin 500 mg TID #90 for 1 month. She will continue to work on weight loss, exercise, and decreasing simple carbohydrates to help decrease the risk of diabetes.   2. Vitamin D deficiency Low Vitamin D level contributes to fatigue and are associated with obesity, breast, and colon cancer. [name] agreed to continue prescription Vitamin D 50,000 IU every week #4 and we will refill for 1 month. She will follow-up for routine testing of Vitamin D, at least 2-3 times per year to avoid over-replacement.  3. Essential hypertension [name] will continue her medications, diet, exercise  as is, and will continue to monitor as she continues her lifestyle modifications.  4. At risk for dehydration [name] was given approximately 15 minutes dehydration prevention counseling today. [name] is at risk for dehydration due to weight loss and current medication(s). She was encouraged to hydrate and monitor fluid status to avoid dehydration as well as weight loss plateaus.   5. Obesity with current BMI 37.0 [name] is currently in the action stage of change. As such, her goal is to continue with weight loss efforts. She has agreed to the Category 3 Plan.   We discussed various medication options to help [name] with her weight loss efforts and we both agreed to continue Saxenda as is, and will continue to follow up as directed.  Exercise goals: As is.  Behavioral modification strategies: increasing lean protein intake and emotional eating strategies.  [name] has agreed to follow-up with our clinic in 3 weeks. She was informed of the importance of frequent follow-up visits to maximize her success with intensive lifestyle modifications for her multiple health conditions.   Objective:   Blood pressure 116/73, pulse 72, temperature 97.9 F (36.6 C), height '5\' 3"'$  (1.6 m), weight 208 lb (94.3 kg), SpO2 98 %. Body mass index is 36.85 kg/m.  General: Cooperative, alert, well developed, in no acute distress. HEENT: Conjunctivae and lids unremarkable. Cardiovascular: Regular rhythm.  Lungs: Normal work of breathing. Neurologic: No focal deficits.   Lab Results  Component Value Date   CREATININE 0.74 05/30/2021   BUN 10 05/30/2021   NA 135 05/30/2021   K 4.2 05/30/2021  CL 99 05/30/2021   CO2 23 05/30/2021   Lab Results  Component Value Date   ALT 11 05/29/2020   AST 13 05/29/2020   ALKPHOS 73 05/29/2020   BILITOT 0.3 05/29/2020   Lab Results  Component Value Date   HGBA1C 5.5 05/08/2021   HGBA1C 5.6 05/29/2020   HGBA1C 5.4 11/27/2019   HGBA1C 5.4 06/27/2019   HGBA1C  6.0 (H) 11/30/2018   Lab Results  Component Value Date   INSULIN 22.8 05/08/2021   INSULIN 15.2 05/29/2020   INSULIN 10.3 11/27/2019   INSULIN 12.5 06/27/2019   INSULIN 14.5 11/30/2018   Lab Results  Component Value Date   TSH 3.820 05/07/2021   Lab Results  Component Value Date   CHOL 223 (H) 05/08/2021   HDL 51 05/08/2021   LDLCALC 147 (H) 05/08/2021   TRIG 139 05/08/2021   CHOLHDL 4.4 11/30/2018   Lab Results  Component Value Date   VD25OH 39.3 05/08/2021   VD25OH 37.5 05/29/2020   VD25OH 31.5 11/27/2019   Lab Results  Component Value Date   WBC 11.3 (H) 05/07/2021   HGB 15.3 05/07/2021   HCT 45.6 05/07/2021   MCV 86 05/07/2021   PLT 409 05/07/2021   No results found for: IRON, TIBC, FERRITIN  Attestation Statements:   Reviewed by clinician on day of visit: allergies, medications, problem list, medical history, surgical history, family history, social history, and previous encounter notes.   I, Trixie Dredge, am acting as transcriptionist for Dennard Nip, MD.  I have reviewed the above documentation for accuracy and completeness, and I agree with the above. -  Dennard Nip, MD

## 2021-07-08 ENCOUNTER — Other Ambulatory Visit (HOSPITAL_COMMUNITY): Payer: Self-pay

## 2021-07-08 MED ORDER — METFORMIN HCL 500 MG PO TABS
500.0000 mg | ORAL_TABLET | Freq: Three times a day (TID) | ORAL | 0 refills | Status: DC
  Filled 2021-07-08: qty 90, 30d supply, fill #0

## 2021-07-08 MED ORDER — VITAMIN D (ERGOCALCIFEROL) 1.25 MG (50000 UNIT) PO CAPS
50000.0000 [IU] | ORAL_CAPSULE | ORAL | 0 refills | Status: DC
  Filled 2021-07-08: qty 4, 28d supply, fill #0

## 2021-07-09 ENCOUNTER — Other Ambulatory Visit (HOSPITAL_COMMUNITY): Payer: Self-pay

## 2021-07-31 ENCOUNTER — Other Ambulatory Visit: Payer: Self-pay

## 2021-07-31 ENCOUNTER — Ambulatory Visit (INDEPENDENT_AMBULATORY_CARE_PROVIDER_SITE_OTHER): Payer: 59 | Admitting: Family Medicine

## 2021-07-31 ENCOUNTER — Encounter (INDEPENDENT_AMBULATORY_CARE_PROVIDER_SITE_OTHER): Payer: Self-pay | Admitting: Family Medicine

## 2021-07-31 VITALS — BP 129/80 | HR 77 | Temp 97.7°F | Ht 63.0 in | Wt 209.0 lb

## 2021-07-31 DIAGNOSIS — E559 Vitamin D deficiency, unspecified: Secondary | ICD-10-CM

## 2021-07-31 DIAGNOSIS — Z6839 Body mass index (BMI) 39.0-39.9, adult: Secondary | ICD-10-CM | POA: Diagnosis not present

## 2021-07-31 DIAGNOSIS — Z9189 Other specified personal risk factors, not elsewhere classified: Secondary | ICD-10-CM

## 2021-07-31 DIAGNOSIS — R7303 Prediabetes: Secondary | ICD-10-CM

## 2021-08-03 NOTE — Progress Notes (Signed)
Chief Complaint:   OBESITY [name] is here to discuss her progress with her obesity treatment plan along with follow-up of her obesity related diagnoses. [name] is on the Category 3 Plan and states she is following her eating plan approximately 85% of the time. [name] states she is walking for 25 minutes 3-4 times per week.  Today's visit was #: 51 Starting weight: 235 lbs Starting date: 11/30/2018 Today's weight: 209 lbs Today's date: 07/31/2021 Total lbs lost to date: 26 Total lbs lost since last in-office visit: 0  Interim History: [name] did some celebration eating while on vacation. She was mindful however and she did well minimizing her weight gain. She is ready to get back on track.  Subjective:   1. Vitamin D deficiency [name] is on Vit D, and her level is slowly improving but is not yet at goal.  2. Pre-diabetes [name] is stable on metformin and Saxenda. She continues to work on diet, exercise, and weight loss.  3. At risk for impaired metabolic function [name] is at increased risk for impaired metabolic function due to current nutrition and muscle mass.  Assessment/Plan:   1. Vitamin D deficiency Low Vitamin D level contributes to fatigue and are associated with obesity, breast, and colon cancer. [name] will continue prescription Vitamin D 50,000 IU every week as is, and we will recheck labs in 1 month. She will follow-up for routine testing of Vitamin D, at least 2-3 times per year to avoid over-replacement.  2. Pre-diabetes [name] will continue metformin and GLP-1, and will continue to follow up as directed. We will recheck labs in 1 month.   3. At risk for impaired metabolic function [name] was given approximately 15 minutes of impaired  metabolic function prevention counseling today. We discussed intensive lifestyle modifications today with an emphasis on specific nutrition and exercise instructions and strategies.   Repetitive spaced learning was  employed today to elicit superior memory formation and behavioral change.  4. Obesity with current BMI 37.2 [name] is currently in the action stage of change. As such, her goal is to continue with weight loss efforts. She has agreed to the Category 3 Plan.   We discussed various medication options to help [name] with her weight loss efforts and we both agreed to continue Saxenda 3 mg daily, and we will refill for 1 month.  Exercise goals: As is.  Behavioral modification strategies: no skipping meals, meal planning and cooking strategies, and dealing with family or coworker sabotage.  [name] has agreed to follow-up with our clinic in 3 weeks. She was informed of the importance of frequent follow-up visits to maximize her success with intensive lifestyle modifications for her multiple health conditions.   Objective:   Blood pressure 129/80, pulse 77, temperature 97.7 F (36.5 C), height '5\' 3"'$  (1.6 m), weight 209 lb (94.8 kg), SpO2 99 %. Body mass index is 37.02 kg/m.  General: Cooperative, alert, well developed, in no acute distress. HEENT: Conjunctivae and lids unremarkable. Cardiovascular: Regular rhythm.  Lungs: Normal work of breathing. Neurologic: No focal deficits.   Lab Results  Component Value Date   CREATININE 0.74 05/30/2021   BUN 10 05/30/2021   NA 135 05/30/2021   K 4.2 05/30/2021   CL 99 05/30/2021   CO2 23 05/30/2021   Lab Results  Component Value Date   ALT 11 05/29/2020   AST 13 05/29/2020   ALKPHOS 73 05/29/2020   BILITOT 0.3 05/29/2020   Lab Results  Component Value Date  HGBA1C 5.5 05/08/2021   HGBA1C 5.6 05/29/2020   HGBA1C 5.4 11/27/2019   HGBA1C 5.4 06/27/2019   HGBA1C 6.0 (H) 11/30/2018   Lab Results  Component Value Date   INSULIN 22.8 05/08/2021   INSULIN 15.2 05/29/2020   INSULIN 10.3 11/27/2019   INSULIN 12.5 06/27/2019   INSULIN 14.5 11/30/2018   Lab Results  Component Value Date   TSH 3.820 05/07/2021   Lab Results   Component Value Date   CHOL 223 (H) 05/08/2021   HDL 51 05/08/2021   LDLCALC 147 (H) 05/08/2021   TRIG 139 05/08/2021   CHOLHDL 4.4 11/30/2018   Lab Results  Component Value Date   VD25OH 39.3 05/08/2021   VD25OH 37.5 05/29/2020   VD25OH 31.5 11/27/2019   Lab Results  Component Value Date   WBC 11.3 (H) 05/07/2021   HGB 15.3 05/07/2021   HCT 45.6 05/07/2021   MCV 86 05/07/2021   PLT 409 05/07/2021   No results found for: IRON, TIBC, FERRITIN  Attestation Statements:   Reviewed by clinician on day of visit: allergies, medications, problem list, medical history, surgical history, family history, social history, and previous encounter notes.   I, Trixie Dredge, am acting as transcriptionist for Dennard Nip, MD.  I have reviewed the above documentation for accuracy and completeness, and I agree with the above. -  Dennard Nip, MD

## 2021-08-04 ENCOUNTER — Ambulatory Visit (INDEPENDENT_AMBULATORY_CARE_PROVIDER_SITE_OTHER): Payer: 59

## 2021-08-04 ENCOUNTER — Other Ambulatory Visit (HOSPITAL_COMMUNITY): Payer: Self-pay

## 2021-08-04 ENCOUNTER — Ambulatory Visit (INDEPENDENT_AMBULATORY_CARE_PROVIDER_SITE_OTHER): Payer: 59 | Admitting: Orthopedic Surgery

## 2021-08-04 ENCOUNTER — Other Ambulatory Visit: Payer: Self-pay

## 2021-08-04 DIAGNOSIS — M549 Dorsalgia, unspecified: Secondary | ICD-10-CM | POA: Diagnosis not present

## 2021-08-04 DIAGNOSIS — M25561 Pain in right knee: Secondary | ICD-10-CM | POA: Diagnosis not present

## 2021-08-04 DIAGNOSIS — M898X1 Other specified disorders of bone, shoulder: Secondary | ICD-10-CM

## 2021-08-04 MED ORDER — LIRAGLUTIDE -WEIGHT MANAGEMENT 18 MG/3ML ~~LOC~~ SOPN
3.0000 mg | PEN_INJECTOR | Freq: Every day | SUBCUTANEOUS | 0 refills | Status: DC
  Filled 2021-08-04 – 2021-08-13 (×2): qty 15, 30d supply, fill #0

## 2021-08-06 ENCOUNTER — Encounter: Payer: Self-pay | Admitting: Orthopedic Surgery

## 2021-08-06 NOTE — Progress Notes (Signed)
Office Visit Note   Patient: [name]           Date of Birth: [birth year]           MRN: [id number] Visit Date: 08/04/2021 Requested by: Susy Frizzle, MD [address] [place],  [place] [address] PCP: Susy Frizzle, MD  Subjective: Chief Complaint  Patient presents with   Neck - Pain   Middle Back - Pain   Right Knee - Pain    HPI: [name] is a 50 year old patient with neck and mid back pain which is mostly scapular in origin.  She also reports right knee pain.  Regarding the neck and mid back she denies any history of injury.  This has been going on for 6 weeks.  Muscle relaxers help.  She has also used ibuprofen as well as deep blue ointment.  Denies any radiation down the arms.  Denies any chest pain or radiation around the front of her thoracic cavity.  She does report sporadic pain once every other day which essentially "takes her breath away".  The pain is not dependent on head position.  Patient also reports right knee pain.  Reports constant pain.  She likes to walk for exercise but she does report some medial sided tenderness with mechanical symptoms.  She has had left knee arthroscopy performed in the past.  Hurts more sitting.  Denies any low back pain radicular symptoms or numbness and tingling.  Left knee arthroscopy was performed in 2019 for medial meniscal tear.  The knee symptoms actually change the way she walks for exercise.              ROS: All systems reviewed are negative as they relate to the chief complaint within the history of present illness.  Patient denies  fevers or chills.   Assessment & Plan: Visit Diagnoses:  1. Acute pain of right knee   2. Upper back pain   3. Pain of right scapula     Plan: Impression is right knee pain long duration more than 6 weeks with failure of conservative management.  Having symptoms which are similar to her left knee.  Medial sided.  No arthritis on radiographs.  Need MRI scan to evaluate likely  medial meniscal tear.  Regarding the right posterior pain this looks more like medial border scapular problem as opposed to cervical or thoracic disc.  No scapular dyskinesia or scapular winging on examination.  She is quite tender along the posterior medial border of the right scapula.  Due to the severity of symptoms as well as duration of symptoms and failure of conservative management MRI of that scapula is indicated to evaluate that posterior medial border to potentially guide further treatment.  Follow-Up Instructions: Return for after MRI.   Orders:  Orders Placed This Encounter  Procedures   XR KNEE 3 VIEW RIGHT   XR Cervical Spine 2 or 3 views   XR Thoracic Spine 2 View   MR SHOULDER RIGHT WO CONTRAST   MR Knee Right w/o contrast   No orders of the defined types were placed in this encounter.     Procedures: No procedures performed   Clinical Data: No additional findings.  Objective: Vital Signs: There were no vitals taken for this visit.  Physical Exam:   Constitutional: Patient appears well-developed HEENT:  Head: Normocephalic Eyes:EOM are normal Neck: Normal range of motion Cardiovascular: Normal rate Pulmonary/chest: Effort normal Neurologic: Patient is alert Skin: Skin is  warm Psychiatric: Patient has normal mood and affect   Ortho Exam: Ortho exam demonstrates good cervical spine range of motion.  5 out of 5 grip EPL FPL interosseous are/extension bicep triceps and deltoid strength bilaterally.  No scapular dyskinesia or scapular winging with forward flexion or abduction of both arms.  Does have fairly discrete tenderness along the posterior medial border of the right scapula not on the left.  No scapulothoracic crepitus is present.  Rotator cuff strength is excellent bilaterally with negative O'Brien's testing.  Right knee has no effusion but positive medial joint line tenderness and equivocal McMurray compression testing.  Collateral cruciate ligaments are  stable.  Extensor mechanism is intact.  Range of motion is full.  Specialty Comments:  No specialty comments available.  Imaging: No results found.   PMFS History: Patient Active Problem List   Diagnosis Date Noted   Palpitations 10/03/2020   At risk for diabetes mellitus 08/12/2020   Asthma 05/21/2020   H/O: hysterectomy 05/21/2020   Prediabetes 12/29/2018   Essential hypertension 09/02/2018   Atypical chest pain 08/18/2018   Shortness of breath 08/18/2018   ADD (attention deficit disorder) 12/29/2017   Chronic diastolic CHF (congestive heart failure) ([facility]) 08/24/2016   Sinus tachycardia (HCC) 70/62/3762   Diastolic dysfunction 83/15/1761   PVC (premature ventricular contraction) 12/17/2015   Family history of premature coronary artery disease 03/14/2015   GERD (gastroesophageal reflux disease) 05/02/2014   Anal fissure 05/02/2014   Leg swelling 05/07/2013   Obesity, unspecified 05/07/2013   Spasm of muscle 12/05/2010   Pain in thoracic spine 12/05/2010   Acute upper respiratory infection 12/05/2010   Vitamin D deficiency 08/20/2010   Other abnormal glucose 08/20/2010   Hypothyroidism 08/20/2010   Depressive disorder, not elsewhere classified 07/31/2010   Anxiety state 07/31/2010   Seborrheic dermatitis, unspecified 09/18/2008   Past Medical History:  Diagnosis Date   Anxiety    Asthma    Back pain    Chest pain    Chronic CHF ([facility])    Dyspnea    Echocardiogram 10/25/2015   EF 60-65, normal wall motion, grade 1 diastolic dysfunction, GLS -19.1%   Edema of left lower extremity    Fracture dislocation of elbow joint 01/15/2013   right   GERD (gastroesophageal reflux disease)    Omeprazole as needed, none in 6 mos.   Hyperlipidemia    Hypertension    Joint pain    Obesity    Palpitations    Prediabetes    R/L Cardiac catheterization 08/18/2018   Normal coronary arteries, mean RA 6, mean PA 17, PCWP 10   Seborrheic dermatitis    Sleep apnea    uses CPAP  "most nights"    Family History  Problem Relation Age of Onset   Diabetes Sister    Heart disease Father 86       MI--1st Dx CAD. Died2that time   Sudden death Father    Diabetes Mother    Hypertension Mother    Heart disease Mother    Obesity Mother    Heart disease Maternal Uncle    Ovarian cancer Maternal Grandmother    Diabetes Maternal Grandmother    Heart disease Maternal Grandmother        Great Grandmother   Diabetes Paternal Grandmother    Liver disease Paternal Aunt    Heart disease Maternal Grandfather     Past Surgical History:  Procedure Laterality Date   CESAREAN SECTION  1998; 10/10/2000   x 2  KNEE ARTHROSCOPY Left 1991   also in 2019   LAPAROSCOPIC ASSISTED VAGINAL HYSTERECTOMY  11/02/2006   LAPAROSCOPIC LYSIS OF ADHESIONS  03/06/2005   RADIAL HEAD ARTHROPLASTY Right 01/17/2013   Procedure: RIGHT RADIAL HEAD ARTHROPLASTY OPEN REDUCTION INTERNAL FIXATION CORONOID ;  Surgeon: Tennis Must, MD;  Location: [facility];  Service: Orthopedics;  Laterality: Right;   RIGHT/LEFT HEART CATH AND CORONARY ANGIOGRAPHY N/A 08/18/2018   Procedure: RIGHT/LEFT HEART CATH AND CORONARY ANGIOGRAPHY;  Surgeon: Nelva Bush, MD;  Location: [facility];  Service: Cardiovascular;  Laterality: N/A;   TUBAL LIGATION  03/06/2005   Social History   Occupational History   Occupation: CMA    Employer: [employer]  Tobacco Use   Smoking status: Never   Smokeless tobacco: Never  Substance and Sexual Activity   Alcohol use: No   Drug use: No   Sexual activity: Not on file

## 2021-08-12 ENCOUNTER — Other Ambulatory Visit (HOSPITAL_COMMUNITY): Payer: Self-pay

## 2021-08-13 ENCOUNTER — Other Ambulatory Visit (HOSPITAL_COMMUNITY): Payer: Self-pay

## 2021-08-13 ENCOUNTER — Other Ambulatory Visit: Payer: Self-pay | Admitting: Family Medicine

## 2021-08-13 MED FILL — Ketoconazole Shampoo 2%: CUTANEOUS | 30 days supply | Qty: 120 | Fill #2 | Status: AC

## 2021-08-13 NOTE — Telephone Encounter (Signed)
Last OV--05/09/21 Last refill--05/09/21 No f/u appt.  Please advise

## 2021-08-14 ENCOUNTER — Other Ambulatory Visit (HOSPITAL_COMMUNITY): Payer: Self-pay

## 2021-08-14 MED ORDER — AMPHETAMINE-DEXTROAMPHET ER 25 MG PO CP24
25.0000 mg | ORAL_CAPSULE | Freq: Every morning | ORAL | 0 refills | Status: DC
Start: 2021-08-14 — End: 2021-09-23
  Filled 2021-08-14: qty 30, 30d supply, fill #0

## 2021-08-21 ENCOUNTER — Ambulatory Visit (INDEPENDENT_AMBULATORY_CARE_PROVIDER_SITE_OTHER): Payer: 59 | Admitting: Family Medicine

## 2021-08-21 ENCOUNTER — Other Ambulatory Visit: Payer: Self-pay

## 2021-08-21 ENCOUNTER — Encounter (INDEPENDENT_AMBULATORY_CARE_PROVIDER_SITE_OTHER): Payer: Self-pay | Admitting: Family Medicine

## 2021-08-21 VITALS — BP 117/76 | HR 87 | Temp 97.9°F | Ht 63.0 in | Wt 209.0 lb

## 2021-08-21 DIAGNOSIS — Z6841 Body Mass Index (BMI) 40.0 and over, adult: Secondary | ICD-10-CM | POA: Diagnosis not present

## 2021-08-21 DIAGNOSIS — F439 Reaction to severe stress, unspecified: Secondary | ICD-10-CM

## 2021-08-25 NOTE — Progress Notes (Signed)
Chief Complaint:   OBESITY [name] is here to discuss her progress with her obesity treatment plan along with follow-up of her obesity related diagnoses. [name] is on the Category 3 Plan and states she is following her eating plan approximately 80-85% of the time. [name] states she is waling 1-1.5 mile for 20-25 minutes 5 times per week.  Today's visit was #: 41 Starting weight: 235 lbs Starting date: 11/30/2018 Today's weight: 209 lbs Today's date: 08/21/2021 Total lbs lost to date: 26 Total lbs lost since last in-office visit: 0  Interim History: [name] has done well maintaining her weight. She has eaten out more but she tried to make better choices.  Subjective:   1. Stress [name] has increased stress at work and with having to ask her son to leave the home. She appears to be dealing with this well overall. She has been able to avoid too much comfort eating.  Assessment/Plan:   1. Stress [name] was offered encouragement and support, and will continue to monitor.  2. Obesity with current BMI 37.1 [name] is currently in the action stage of change. As such, her goal is to continue with weight loss efforts. She has agreed to the Category 3 Plan.   We discussed various medication options to help [name] with her weight loss efforts and we both agreed to continue Saxenda, and will continue to follow up as directed.  Exercise goals: As is.  Behavioral modification strategies: increasing lean protein intake.  [name] has agreed to follow-up with our clinic in 3 to 4 weeks. She was informed of the importance of frequent follow-up visits to maximize her success with intensive lifestyle modifications for her multiple health conditions.   Objective:   Blood pressure 117/76, pulse 87, temperature 97.9 F (36.6 C), height 5\' 3"  (1.6 m), weight 209 lb (94.8 kg), SpO2 98 %. Body mass index is 37.02 kg/m.  General: Cooperative, alert, well developed, in no acute  distress. HEENT: Conjunctivae and lids unremarkable. Cardiovascular: Regular rhythm.  Lungs: Normal work of breathing. Neurologic: No focal deficits.   Lab Results  Component Value Date   CREATININE 0.74 05/30/2021   BUN 10 05/30/2021   NA 135 05/30/2021   K 4.2 05/30/2021   CL 99 05/30/2021   CO2 23 05/30/2021   Lab Results  Component Value Date   ALT 11 05/29/2020   AST 13 05/29/2020   ALKPHOS 73 05/29/2020   BILITOT 0.3 05/29/2020   Lab Results  Component Value Date   HGBA1C 5.5 05/08/2021   HGBA1C 5.6 05/29/2020   HGBA1C 5.4 11/27/2019   HGBA1C 5.4 06/27/2019   HGBA1C 6.0 (H) 11/30/2018   Lab Results  Component Value Date   INSULIN 22.8 05/08/2021   INSULIN 15.2 05/29/2020   INSULIN 10.3 11/27/2019   INSULIN 12.5 06/27/2019   INSULIN 14.5 11/30/2018   Lab Results  Component Value Date   TSH 3.820 05/07/2021   Lab Results  Component Value Date   CHOL 223 (H) 05/08/2021   HDL 51 05/08/2021   LDLCALC 147 (H) 05/08/2021   TRIG 139 05/08/2021   CHOLHDL 4.4 11/30/2018   Lab Results  Component Value Date   VD25OH 39.3 05/08/2021   VD25OH 37.5 05/29/2020   VD25OH 31.5 11/27/2019   Lab Results  Component Value Date   WBC 11.3 (H) 05/07/2021   HGB 15.3 05/07/2021   HCT 45.6 05/07/2021   MCV 86 05/07/2021   PLT 409 05/07/2021   No results found for: IRON,  TIBC, FERRITIN  Attestation Statements:   Reviewed by clinician on day of visit: allergies, medications, problem list, medical history, surgical history, family history, social history, and previous encounter notes.  Time spent on visit including pre-visit chart review and post-visit care and charting was 20 minutes.    I, Trixie Dredge, am acting as transcriptionist for Dennard Nip, MD.  I have reviewed the above documentation for accuracy and completeness, and I agree with the above. -  Dennard Nip, MD

## 2021-08-31 ENCOUNTER — Ambulatory Visit
Admission: RE | Admit: 2021-08-31 | Discharge: 2021-08-31 | Disposition: A | Discharge status: Home / Self Care | Payer: 59 | Source: Ambulatory Visit | Attending: Orthopedic Surgery | Admitting: Orthopedic Surgery

## 2021-08-31 ENCOUNTER — Other Ambulatory Visit: Payer: Self-pay

## 2021-08-31 DIAGNOSIS — M898X1 Other specified disorders of bone, shoulder: Secondary | ICD-10-CM

## 2021-08-31 DIAGNOSIS — S83241A Other tear of medial meniscus, current injury, right knee, initial encounter: Secondary | ICD-10-CM | POA: Diagnosis not present

## 2021-08-31 DIAGNOSIS — M25561 Pain in right knee: Secondary | ICD-10-CM

## 2021-08-31 DIAGNOSIS — S83411A Sprain of medial collateral ligament of right knee, initial encounter: Secondary | ICD-10-CM | POA: Diagnosis not present

## 2021-09-03 ENCOUNTER — Ambulatory Visit (INDEPENDENT_AMBULATORY_CARE_PROVIDER_SITE_OTHER): Payer: 59 | Admitting: Orthopedic Surgery

## 2021-09-03 ENCOUNTER — Other Ambulatory Visit: Payer: Self-pay

## 2021-09-03 DIAGNOSIS — M25561 Pain in right knee: Secondary | ICD-10-CM

## 2021-09-03 DIAGNOSIS — S838X1D Sprain of other specified parts of right knee, subsequent encounter: Secondary | ICD-10-CM | POA: Diagnosis not present

## 2021-09-07 ENCOUNTER — Encounter: Payer: Self-pay | Admitting: Orthopedic Surgery

## 2021-09-07 MED ORDER — BUPIVACAINE HCL 0.25 % IJ SOLN
4.0000 mL | INTRAMUSCULAR | Status: AC | PRN
  Administered 2021-09-03: 4 mL via INTRA_ARTICULAR

## 2021-09-07 MED ORDER — LIDOCAINE HCL 1 % IJ SOLN
5.0000 mL | INTRAMUSCULAR | Status: AC | PRN
  Administered 2021-09-03: 5 mL

## 2021-09-07 MED ORDER — METHYLPREDNISOLONE ACETATE 40 MG/ML IJ SUSP
40.0000 mg | INTRAMUSCULAR | Status: AC | PRN
  Administered 2021-09-03: 40 mg via INTRA_ARTICULAR

## 2021-09-07 NOTE — Progress Notes (Signed)
   Procedure Note  Patient: [name]             Date of Birth: Apr 02, 1971           MRN: [id number]             Visit Date: 09/03/2021  Procedures: Visit Diagnoses:  1. Injury of meniscus of right knee, subsequent encounter     Large Joint Inj: R knee on 09/03/2021 11:38 AM Indications: diagnostic evaluation, joint swelling and pain Details: 18 G 1.5 in needle, superolateral approach  Arthrogram: No  Medications: 5 mL lidocaine 1 %; 40 mg methylPREDNISolone acetate 40 MG/ML; 4 mL bupivacaine 0.25 % Outcome: tolerated well, no immediate complications Procedure, treatment alternatives, risks and benefits explained, specific risks discussed. Consent was given by the patient. Immediately prior to procedure a time out was called to verify the correct patient, procedure, equipment, support staff and site/side marked as required. Patient was prepped and draped in the usual sterile fashion.   [name] is a 50 year old patient here to review MRI scan on her right knee.  Scan shows degenerative type medial meniscal tear with no unstable flap components.  No effusion.  ACL PCL intact.  No acute arthritis present.  Compared the scan to her left knee which has had prior surgery and they look relatively equivalent.  Plan at this time is cortisone injection to see if that can calm down some of the pain in her knee.  If not she may need to consider arthroscopic intervention.

## 2021-09-17 ENCOUNTER — Other Ambulatory Visit: Payer: Self-pay

## 2021-09-17 ENCOUNTER — Other Ambulatory Visit (HOSPITAL_COMMUNITY): Payer: Self-pay

## 2021-09-17 ENCOUNTER — Encounter (INDEPENDENT_AMBULATORY_CARE_PROVIDER_SITE_OTHER): Payer: Self-pay | Admitting: Family Medicine

## 2021-09-17 ENCOUNTER — Ambulatory Visit (INDEPENDENT_AMBULATORY_CARE_PROVIDER_SITE_OTHER): Payer: 59 | Admitting: Family Medicine

## 2021-09-17 VITALS — BP 116/72 | HR 100 | Temp 97.8°F | Ht 63.0 in | Wt 208.0 lb

## 2021-09-17 DIAGNOSIS — Z6841 Body Mass Index (BMI) 40.0 and over, adult: Secondary | ICD-10-CM

## 2021-09-17 DIAGNOSIS — E559 Vitamin D deficiency, unspecified: Secondary | ICD-10-CM | POA: Diagnosis not present

## 2021-09-17 MED ORDER — VITAMIN D (ERGOCALCIFEROL) 1.25 MG (50000 UNIT) PO CAPS
50000.0000 [IU] | ORAL_CAPSULE | ORAL | 0 refills | Status: DC
  Filled 2021-09-17: qty 4, 28d supply, fill #0

## 2021-09-17 NOTE — Progress Notes (Signed)
Chief Complaint:   OBESITY [name] is here to discuss her progress with her obesity treatment plan along with follow-up of her obesity related diagnoses. [name] is on the Category 3 Plan and states she is following her eating plan approximately 85% of the time. Keeya states she is walking for 25 minutes 5 times per week.  Today's visit was #: 62 Starting weight: 235 lbs Starting date: 11/30/2018 Today's weight: 208 lbs Today's date: 09/17/2021 Total lbs lost to date: 27 Total lbs lost since last in-office visit: 1  Interim History: [name] continues to do well with weight loss despite some celebration eating last night. Her hunger is controlled and she is doing well with making good choices.  Subjective:   1. Vitamin D deficiency [name] is stable on Vit D, and she is due for labs soon.  Assessment/Plan:   1. Vitamin D deficiency Low Vitamin D level contributes to fatigue and are associated with obesity, breast, and colon cancer. We will refill prescription Vitamin D 50,000 IU every week #4 for 1 month, and we will recheck labs in 1 month. [name] will follow-up for routine testing of Vitamin D, at least 2-3 times per year to avoid over-replacement.  2. Obesity with current BMI of 36.8 [name] is currently in the action stage of change. As such, her goal is to continue with weight loss efforts. She has agreed to the Category 3 Plan.   We discussed various medication options to help [name] with her weight loss efforts and we both agreed to continue Saxenda as is, and will continue to follow up as directed.  We will recheck fasting labs at her next visit.  Exercise goals: As is.  Behavioral modification strategies: dealing with family or coworker sabotage and holiday eating strategies .  [name] has agreed to follow-up with our clinic in 4 to 5 weeks. She was informed of the importance of frequent follow-up visits to maximize her success with intensive lifestyle  modifications for her multiple health conditions.   Objective:   Blood pressure 116/72, pulse 100, temperature 97.8 F (36.6 C), temperature source Oral, height 5\' 3"  (1.6 m), weight 208 lb (94.3 kg), SpO2 (!) 85 %. Body mass index is 36.85 kg/m.  General: Cooperative, alert, well developed, in no acute distress. HEENT: Conjunctivae and lids unremarkable. Cardiovascular: Regular rhythm.  Lungs: Normal work of breathing. Neurologic: No focal deficits.   Lab Results  Component Value Date   CREATININE 0.74 05/30/2021   BUN 10 05/30/2021   NA 135 05/30/2021   K 4.2 05/30/2021   CL 99 05/30/2021   CO2 23 05/30/2021   Lab Results  Component Value Date   ALT 11 05/29/2020   AST 13 05/29/2020   ALKPHOS 73 05/29/2020   BILITOT 0.3 05/29/2020   Lab Results  Component Value Date   HGBA1C 5.5 05/08/2021   HGBA1C 5.6 05/29/2020   HGBA1C 5.4 11/27/2019   HGBA1C 5.4 06/27/2019   HGBA1C 6.0 (H) 11/30/2018   Lab Results  Component Value Date   INSULIN 22.8 05/08/2021   INSULIN 15.2 05/29/2020   INSULIN 10.3 11/27/2019   INSULIN 12.5 06/27/2019   INSULIN 14.5 11/30/2018   Lab Results  Component Value Date   TSH 3.820 05/07/2021   Lab Results  Component Value Date   CHOL 223 (H) 05/08/2021   HDL 51 05/08/2021   LDLCALC 147 (H) 05/08/2021   TRIG 139 05/08/2021   CHOLHDL 4.4 11/30/2018   Lab Results  Component Value Date  VD25OH 39.3 05/08/2021   VD25OH 37.5 05/29/2020   VD25OH 31.5 11/27/2019   Lab Results  Component Value Date   WBC 11.3 (H) 05/07/2021   HGB 15.3 05/07/2021   HCT 45.6 05/07/2021   MCV 86 05/07/2021   PLT 409 05/07/2021   No results found for: IRON, TIBC, FERRITIN  Attestation Statements:   Reviewed by clinician on day of visit: allergies, medications, problem list, medical history, surgical history, family history, social history, and previous encounter notes.  Time spent on visit including pre-visit chart review and post-visit care and  charting was 30 minutes.    I, Trixie Dredge, am acting as transcriptionist for Dennard Nip, MD.  I have reviewed the above documentation for accuracy and completeness, and I agree with the above. -  Dennard Nip, MD

## 2021-09-23 ENCOUNTER — Other Ambulatory Visit (HOSPITAL_COMMUNITY): Payer: Self-pay

## 2021-09-23 ENCOUNTER — Other Ambulatory Visit: Payer: Self-pay | Admitting: Family Medicine

## 2021-09-23 MED ORDER — AMPHETAMINE-DEXTROAMPHET ER 25 MG PO CP24
25.0000 mg | ORAL_CAPSULE | Freq: Every morning | ORAL | 0 refills | Status: DC
  Filled 2021-09-23: qty 30, 30d supply, fill #0

## 2021-09-23 MED ORDER — ALPRAZOLAM 0.5 MG PO TABS
0.5000 mg | ORAL_TABLET | Freq: Three times a day (TID) | ORAL | 0 refills | Status: DC | PRN
  Filled 2021-09-23: qty 45, 15d supply, fill #0

## 2021-09-23 MED FILL — Ketoconazole Shampoo 2%: CUTANEOUS | 30 days supply | Qty: 120 | Fill #3 | Status: AC

## 2021-09-23 NOTE — Telephone Encounter (Signed)
Ok to refill??  Last office visit 05/09/2021.  Last refill on Xanax 04/18/2021.   Last refill on Adderall 08/14/2021.

## 2021-10-21 ENCOUNTER — Encounter (INDEPENDENT_AMBULATORY_CARE_PROVIDER_SITE_OTHER): Payer: Self-pay

## 2021-10-22 ENCOUNTER — Ambulatory Visit (INDEPENDENT_AMBULATORY_CARE_PROVIDER_SITE_OTHER): Payer: 59 | Admitting: Family Medicine

## 2021-10-23 ENCOUNTER — Other Ambulatory Visit (HOSPITAL_COMMUNITY): Payer: Self-pay

## 2021-10-23 ENCOUNTER — Encounter (INDEPENDENT_AMBULATORY_CARE_PROVIDER_SITE_OTHER): Payer: Self-pay | Admitting: Family Medicine

## 2021-10-23 ENCOUNTER — Other Ambulatory Visit: Payer: Self-pay

## 2021-10-23 ENCOUNTER — Ambulatory Visit (INDEPENDENT_AMBULATORY_CARE_PROVIDER_SITE_OTHER): Payer: 59 | Admitting: Family Medicine

## 2021-10-23 VITALS — BP 120/79 | HR 73 | Temp 98.6°F | Ht 63.0 in | Wt 209.0 lb

## 2021-10-23 DIAGNOSIS — E7849 Other hyperlipidemia: Secondary | ICD-10-CM | POA: Diagnosis not present

## 2021-10-23 DIAGNOSIS — Z6839 Body mass index (BMI) 39.0-39.9, adult: Secondary | ICD-10-CM | POA: Diagnosis not present

## 2021-10-23 DIAGNOSIS — E559 Vitamin D deficiency, unspecified: Secondary | ICD-10-CM

## 2021-10-23 DIAGNOSIS — R7303 Prediabetes: Secondary | ICD-10-CM

## 2021-10-23 MED ORDER — UNIFINE PENTIPS 32G X 4 MM MISC
1.0000 | Freq: Two times a day (BID) | 0 refills | Status: DC
  Filled 2021-10-23: qty 100, 50d supply, fill #0

## 2021-10-23 MED ORDER — METFORMIN HCL 500 MG PO TABS
500.0000 mg | ORAL_TABLET | Freq: Three times a day (TID) | ORAL | 0 refills | Status: DC
  Filled 2021-10-23: qty 90, 30d supply, fill #0

## 2021-10-23 MED ORDER — LIRAGLUTIDE -WEIGHT MANAGEMENT 18 MG/3ML ~~LOC~~ SOPN
3.0000 mg | PEN_INJECTOR | Freq: Every day | SUBCUTANEOUS | 0 refills | Status: DC
  Filled 2021-10-23: qty 15, 30d supply, fill #0

## 2021-10-23 NOTE — Progress Notes (Signed)
Chief Complaint:   OBESITY [name] is here to discuss her progress with her obesity treatment plan along with follow-up of her obesity related diagnoses. [name] is on the Category 3 Plan and states she is following her eating plan approximately 80% of the time. [name] states she is walking 20-25 minutes 3-4 times per week.  Today's visit was #: 41 Starting weight: 235 lbs Starting date: 11/30/2018 Today's weight: 209 lbs Today's date: 10/23/2021 Total lbs lost to date: 26 Total lbs lost since last in-office visit: 0  Interim History: [name] reports last month was good. She had Thanksgiving at her in-laws. Pt and her husband both have birthdays in December and may eat out with burgers. She is going to have a local Christmas celebration. Pt is looking to change breakfast and lunch options.  Subjective:   1. Vitamin D deficiency Pt denies nausea, vomiting, and muscle weakness but notes fatigue.   2. Pre-diabetes [name]'s last A1c was 5.5 with an insulin level os 22.8.  3. Other hyperlipidemia Her last LDL was 147 and HDL 51.  Assessment/Plan:   1. Vitamin D deficiency Low Vitamin D level contributes to fatigue and are associated with obesity, breast, and colon cancer. She agrees to continue to take prescription Vitamin D 50,000 IU every week and will follow-up for routine testing of Vitamin D, at least 2-3 times per year to avoid over-replacement. Check labs today.  - VITAMIN D 25 Hydroxy (Vit-D Deficiency, Fractures)  2. Pre-diabetes [name] will continue to work on weight loss, exercise, and decreasing simple carbohydrates to help decrease the risk of diabetes. Continue Metformin as prescribed. Check labs today.  - Comprehensive metabolic panel - Hemoglobin A1c - Insulin, random  Refill- metFORMIN (GLUCOPHAGE) 500 MG tablet; Take 1 tablet (500 mg total) by mouth in the morning, at noon, and at bedtime.  Dispense: 90 tablet; Refill: 0  3. Other  hyperlipidemia Cardiovascular risk and specific lipid/LDL goals reviewed.  We discussed several lifestyle modifications today and [name] will continue to work on diet, exercise and weight loss efforts. Orders and follow up as documented in patient record.   Counseling Intensive lifestyle modifications are the first line treatment for this issue. Dietary changes: Increase soluble fiber. Decrease simple carbohydrates. Exercise changes: Moderate to vigorous-intensity aerobic activity 150 minutes per week if tolerated. Lipid-lowering medications: see documented in medical record. Check labs today.  - Lipid Panel With LDL/HDL Ratio  4. Obesity with current BMI of 37.0  [name] is currently in the action stage of change. As such, her goal is to continue with weight loss efforts. She has agreed to the Category 3 Plan.   We discussed various medication options to help [name] with her weight loss efforts and we both agreed to continue Saxenda as prescribed.  Refill- Insulin Pen Needle (UNIFINE PENTIPS) 32G X 4 MM MISC; Use 2 times a day as directed  Dispense: 100 each; Refill: 0 Refill- Liraglutide -Weight Management 18 MG/3ML SOPN; Inject 3 mg into the skin daily.  Dispense: 15 mL; Refill: 0  Exercise goals: All adults should avoid inactivity. Some physical activity is better than none, and adults who participate in any amount of physical activity gain some health benefits.  Behavioral modification strategies: increasing lean protein intake, meal planning and cooking strategies, keeping healthy foods in the home, and planning for success.  [name] has agreed to follow-up with our clinic in 4 weeks. She was informed of the importance of frequent follow-up visits to maximize her success  with intensive lifestyle modifications for her multiple health conditions.   [name] was informed we would discuss her lab results at her next visit unless there is a critical issue that needs to be addressed  sooner. [name] agreed to keep her next visit at the agreed upon time to discuss these results.  Objective:   Blood pressure 120/79, pulse 73, temperature 98.6 F (37 C), height 5\' 3"  (1.6 m), weight 209 lb (94.8 kg), SpO2 99 %. Body mass index is 37.02 kg/m.  General: Cooperative, alert, well developed, in no acute distress. HEENT: Conjunctivae and lids unremarkable. Cardiovascular: Regular rhythm.  Lungs: Normal work of breathing. Neurologic: No focal deficits.   Lab Results  Component Value Date   CREATININE 0.74 05/30/2021   BUN 10 05/30/2021   NA 135 05/30/2021   K 4.2 05/30/2021   CL 99 05/30/2021   CO2 23 05/30/2021   Lab Results  Component Value Date   ALT 11 05/29/2020   AST 13 05/29/2020   ALKPHOS 73 05/29/2020   BILITOT 0.3 05/29/2020   Lab Results  Component Value Date   HGBA1C 5.5 05/08/2021   HGBA1C 5.6 05/29/2020   HGBA1C 5.4 11/27/2019   HGBA1C 5.4 06/27/2019   HGBA1C 6.0 (H) 11/30/2018   Lab Results  Component Value Date   INSULIN 22.8 05/08/2021   INSULIN 15.2 05/29/2020   INSULIN 10.3 11/27/2019   INSULIN 12.5 06/27/2019   INSULIN 14.5 11/30/2018   Lab Results  Component Value Date   TSH 3.820 05/07/2021   Lab Results  Component Value Date   CHOL 223 (H) 05/08/2021   HDL 51 05/08/2021   LDLCALC 147 (H) 05/08/2021   TRIG 139 05/08/2021   CHOLHDL 4.4 11/30/2018   Lab Results  Component Value Date   VD25OH 39.3 05/08/2021   VD25OH 37.5 05/29/2020   VD25OH 31.5 11/27/2019   Lab Results  Component Value Date   WBC 11.3 (H) 05/07/2021   HGB 15.3 05/07/2021   HCT 45.6 05/07/2021   MCV 86 05/07/2021   PLT 409 05/07/2021    Attestation Statements:   Reviewed by clinician on day of visit: allergies, medications, problem list, medical history, surgical history, family history, social history, and previous encounter notes.  Coral Ceo, CMA, am acting as transcriptionist for Coralie Common, MD.   I have reviewed the  above documentation for accuracy and completeness, and I agree with the above. - Coralie Common, MD

## 2021-10-24 LAB — COMPREHENSIVE METABOLIC PANEL
ALT: 9 IU/L (ref 0–32)
AST: 14 IU/L (ref 0–40)
Albumin/Globulin Ratio: 1.6 (ref 1.2–2.2)
Albumin: 4.1 g/dL (ref 3.8–4.8)
Alkaline Phosphatase: 70 IU/L (ref 44–121)
BUN/Creatinine Ratio: 21 (ref 9–23)
BUN: 14 mg/dL (ref 6–24)
Bilirubin Total: 0.3 mg/dL (ref 0.0–1.2)
CO2: 24 mmol/L (ref 20–29)
Calcium: 9.1 mg/dL (ref 8.7–10.2)
Chloride: 98 mmol/L (ref 96–106)
Creatinine, Ser: 0.68 mg/dL (ref 0.57–1.00)
Globulin, Total: 2.6 g/dL (ref 1.5–4.5)
Glucose: 87 mg/dL (ref 70–99)
Potassium: 4.5 mmol/L (ref 3.5–5.2)
Sodium: 134 mmol/L (ref 134–144)
Total Protein: 6.7 g/dL (ref 6.0–8.5)
eGFR: 108 mL/min/{1.73_m2} (ref >59)

## 2021-10-24 LAB — HEMOGLOBIN A1C
Est. average glucose Bld gHb Est-mCnc: 114 mg/dL
Hgb A1c MFr Bld: 5.6 % (ref 4.8–5.6)

## 2021-10-24 LAB — LIPID PANEL WITH LDL/HDL RATIO
Cholesterol, Total: 188 mg/dL (ref 100–199)
HDL: 40 mg/dL (ref >39)
LDL Chol Calc (NIH): 126 mg/dL — ABNORMAL HIGH (ref 0–99)
LDL/HDL Ratio: 3.2 ratio (ref 0.0–3.2)
Triglycerides: 124 mg/dL (ref 0–149)
VLDL Cholesterol Cal: 22 mg/dL (ref 5–40)

## 2021-10-24 LAB — VITAMIN D 25 HYDROXY (VIT D DEFICIENCY, FRACTURES): Vit D, 25-Hydroxy: 39.1 ng/mL (ref 30.0–100.0)

## 2021-10-24 LAB — INSULIN, RANDOM: INSULIN: 9.3 u[IU]/mL (ref 2.6–24.9)

## 2021-10-27 ENCOUNTER — Other Ambulatory Visit (HOSPITAL_COMMUNITY): Payer: Self-pay

## 2021-10-27 ENCOUNTER — Other Ambulatory Visit: Payer: Self-pay | Admitting: Family Medicine

## 2021-10-27 MED ORDER — AMPHETAMINE-DEXTROAMPHET ER 25 MG PO CP24
25.0000 mg | ORAL_CAPSULE | Freq: Every morning | ORAL | 0 refills | Status: DC
  Filled 2021-10-27: qty 30, 30d supply, fill #0

## 2021-11-04 ENCOUNTER — Other Ambulatory Visit (HOSPITAL_COMMUNITY): Payer: Self-pay

## 2021-11-25 ENCOUNTER — Encounter (INDEPENDENT_AMBULATORY_CARE_PROVIDER_SITE_OTHER): Payer: Self-pay | Admitting: Family Medicine

## 2021-11-25 ENCOUNTER — Ambulatory Visit (INDEPENDENT_AMBULATORY_CARE_PROVIDER_SITE_OTHER): Payer: 59 | Admitting: Family Medicine

## 2021-11-25 ENCOUNTER — Other Ambulatory Visit: Payer: Self-pay

## 2021-11-25 VITALS — BP 123/75 | HR 85 | Temp 98.0°F | Ht 63.0 in | Wt 210.0 lb

## 2021-11-25 DIAGNOSIS — Z9189 Other specified personal risk factors, not elsewhere classified: Secondary | ICD-10-CM | POA: Diagnosis not present

## 2021-11-25 DIAGNOSIS — E559 Vitamin D deficiency, unspecified: Secondary | ICD-10-CM

## 2021-11-25 DIAGNOSIS — E7849 Other hyperlipidemia: Secondary | ICD-10-CM

## 2021-11-25 DIAGNOSIS — Z6839 Body mass index (BMI) 39.0-39.9, adult: Secondary | ICD-10-CM

## 2021-11-25 DIAGNOSIS — Z6837 Body mass index (BMI) 37.0-37.9, adult: Secondary | ICD-10-CM | POA: Diagnosis not present

## 2021-11-26 ENCOUNTER — Other Ambulatory Visit (HOSPITAL_COMMUNITY): Payer: Self-pay

## 2021-11-26 MED ORDER — VITAMIN D (ERGOCALCIFEROL) 1.25 MG (50000 UNIT) PO CAPS
50000.0000 [IU] | ORAL_CAPSULE | ORAL | 0 refills | Status: DC
  Filled 2021-11-26: qty 4, 28d supply, fill #0

## 2021-11-26 NOTE — Progress Notes (Signed)
Chief Complaint:   OBESITY [name] is here to discuss her progress with her obesity treatment plan along with follow-up of her obesity related diagnoses. [name] is on the Category 3 Plan and states she is following her eating plan approximately 80% of the time. [name] states she is doing 0 minutes 0 times per week.  Today's visit was #: 54 Starting weight: 235 lbs Starting date: 11/30/2018 Today's weight: 210 lbs Today's date: 11/25/2021 Total lbs lost to date: 25 Total lbs lost since last in-office visit: 0  Interim History: [name] has done well with minimizing holiday weight gain, and she has done well with starting to get back to her plan. She is considering starting exercise.  Subjective:   1. Vitamin D deficiency [name]'s Vit D level is not yet at goal. No side effects were noted. I discussed labs with the patient today.  2. Other hyperlipidemia [name]'s LDL is improving recently with diet. She is not on statin, and she denies chest pain. I discussed labs with the patient today.  3. At risk for heart disease [name] is at a higher than average risk for cardiovascular disease due to obesity.   Assessment/Plan:   1. Vitamin D deficiency Low Vitamin D level contributes to fatigue and are associated with obesity, breast, and colon cancer. We will refill prescription Vitamin D for 1 month. [name] will follow-up for routine testing of Vitamin D, at least 2-3 times per year to avoid over-replacement.  2. Other hyperlipidemia Cardiovascular risk and specific lipid/LDL goals reviewed. We discussed several lifestyle modifications today. [name] will continue to work on diet, exercise, and weight loss efforts. Orders and follow up as documented in patient record.   3. At risk for heart disease [name] was given approximately 15 minutes of coronary artery disease prevention counseling today. She is 51 y.o. female and has risk factors for heart disease including obesity. We  discussed intensive lifestyle modifications today with an emphasis on specific weight loss instructions and strategies.   Repetitive spaced learning was employed today to elicit superior memory formation and behavioral change.  4. Obesity with current BMI of 37.3 [name] is currently in the action stage of change. As such, her goal is to continue with weight loss efforts. She has agreed to the Category 3 Plan.   Exercise goals: Start at the gym, especially with strengthening.  Behavioral modification strategies: increasing lean protein intake and meal planning and cooking strategies.  [name] has agreed to follow-up with our clinic in 3 weeks. She was informed of the importance of frequent follow-up visits to maximize her success with intensive lifestyle modifications for her multiple health conditions.   Objective:   Blood pressure 123/75, pulse 85, temperature 98 F (36.7 C), height 5\' 3"  (1.6 m), weight 210 lb (95.3 kg), SpO2 99 %. Body mass index is 37.2 kg/m.  General: Cooperative, alert, well developed, in no acute distress. HEENT: Conjunctivae and lids unremarkable. Cardiovascular: Regular rhythm.  Lungs: Normal work of breathing. Neurologic: No focal deficits.   Lab Results  Component Value Date   CREATININE 0.68 10/23/2021   BUN 14 10/23/2021   NA 134 10/23/2021   K 4.5 10/23/2021   CL 98 10/23/2021   CO2 24 10/23/2021   Lab Results  Component Value Date   ALT 9 10/23/2021   AST 14 10/23/2021   ALKPHOS 70 10/23/2021   BILITOT 0.3 10/23/2021   Lab Results  Component Value Date   HGBA1C 5.6 10/23/2021   HGBA1C 5.5  05/08/2021   HGBA1C 5.6 05/29/2020   HGBA1C 5.4 11/27/2019   HGBA1C 5.4 06/27/2019   Lab Results  Component Value Date   INSULIN 9.3 10/23/2021   INSULIN 22.8 05/08/2021   INSULIN 15.2 05/29/2020   INSULIN 10.3 11/27/2019   INSULIN 12.5 06/27/2019   Lab Results  Component Value Date   TSH 3.820 05/07/2021   Lab Results  Component Value  Date   CHOL 188 10/23/2021   HDL 40 10/23/2021   LDLCALC 126 (H) 10/23/2021   TRIG 124 10/23/2021   CHOLHDL 4.4 11/30/2018   Lab Results  Component Value Date   VD25OH 39.1 10/23/2021   VD25OH 39.3 05/08/2021   VD25OH 37.5 05/29/2020   Lab Results  Component Value Date   WBC 11.3 (H) 05/07/2021   HGB 15.3 05/07/2021   HCT 45.6 05/07/2021   MCV 86 05/07/2021   PLT 409 05/07/2021   No results found for: IRON, TIBC, FERRITIN  Attestation Statements:   Reviewed by clinician on day of visit: allergies, medications, problem list, medical history, surgical history, family history, social history, and previous encounter notes.   I, Trixie Dredge, am acting as transcriptionist for Dennard Nip, MD.  I have reviewed the above documentation for accuracy and completeness, and I agree with the above. -  Dennard Nip, MD

## 2021-12-12 ENCOUNTER — Other Ambulatory Visit (HOSPITAL_COMMUNITY): Payer: Self-pay

## 2021-12-12 ENCOUNTER — Other Ambulatory Visit: Payer: Self-pay | Admitting: Family Medicine

## 2021-12-12 MED ORDER — AMPHETAMINE-DEXTROAMPHET ER 25 MG PO CP24
25.0000 mg | ORAL_CAPSULE | Freq: Every morning | ORAL | 0 refills | Status: DC
  Filled 2021-12-12: qty 30, 30d supply, fill #0

## 2021-12-12 MED FILL — Ketoconazole Shampoo 2%: CUTANEOUS | 30 days supply | Qty: 120 | Fill #4 | Status: AC

## 2021-12-12 MED FILL — Clobetasol Propionate Soln 0.05%: CUTANEOUS | 30 days supply | Qty: 50 | Fill #1 | Status: AC

## 2021-12-12 NOTE — Telephone Encounter (Signed)
Adderall refill request.  Last seen 05/09/2021, last filled 10/27/2021.

## 2021-12-23 ENCOUNTER — Other Ambulatory Visit: Payer: Self-pay

## 2021-12-23 ENCOUNTER — Other Ambulatory Visit (HOSPITAL_COMMUNITY): Payer: Self-pay

## 2021-12-23 ENCOUNTER — Encounter (INDEPENDENT_AMBULATORY_CARE_PROVIDER_SITE_OTHER): Payer: Self-pay | Admitting: Family Medicine

## 2021-12-23 ENCOUNTER — Ambulatory Visit (INDEPENDENT_AMBULATORY_CARE_PROVIDER_SITE_OTHER): Payer: 59 | Admitting: Family Medicine

## 2021-12-23 VITALS — BP 118/75 | HR 80 | Temp 98.1°F | Ht 63.0 in | Wt 209.0 lb

## 2021-12-23 DIAGNOSIS — E669 Obesity, unspecified: Secondary | ICD-10-CM

## 2021-12-23 DIAGNOSIS — Z6837 Body mass index (BMI) 37.0-37.9, adult: Secondary | ICD-10-CM | POA: Diagnosis not present

## 2021-12-23 DIAGNOSIS — E559 Vitamin D deficiency, unspecified: Secondary | ICD-10-CM | POA: Diagnosis not present

## 2021-12-23 MED ORDER — VITAMIN D (ERGOCALCIFEROL) 1.25 MG (50000 UNIT) PO CAPS
50000.0000 [IU] | ORAL_CAPSULE | ORAL | 0 refills | Status: DC
  Filled 2021-12-23: qty 4, 28d supply, fill #0

## 2021-12-24 NOTE — Progress Notes (Signed)
Chief Complaint:   OBESITY [name] is here to discuss her progress with her obesity treatment plan along with follow-up of her obesity related diagnoses. [name] is on the Category 3 Plan and states she is following her eating plan approximately 80-85% of the time. [name] states she is walking for 20-25 minutes 2 times per week.  Today's visit was #: 39 Starting weight: 235 lbs Starting date: 11/30/2018 Today's weight: 209 lbs Today's date: 12/23/2021 Total lbs lost to date: 26 Total lbs lost since last in-office visit: 1  Interim History: [name] continues to do well with weight loss. She notes that she has been under more stress since her last visit. She recently joined [employer]. Her goal is to go to the gym 2 days per week. She is struggling with meeting her protein goal. She has a good friend for support.  Subjective:   1. Vitamin D deficiency Analy is taking Vitamin D 50,000 IU weekly, and she denies side effects. Last Vit D level was 39.1 on 10/23/2021.  Assessment/Plan:   1. Vitamin D deficiency We will refill prescription Vitamin D for 1 month. [name] will follow-up for routine testing of Vitamin D, at least 2-3 times per year to avoid over-replacement.  - Vitamin D, Ergocalciferol, (DRISDOL) 1.25 MG (50000 UNIT) CAPS capsule; Take 1 capsule by mouth every 7 days.  Dispense: 4 capsule; Refill: 0  2. Obesity with current BMI of 37.0 [name] is currently in the action stage of change. As such, her goal is to continue with weight loss efforts. She has agreed to the Category 3 Plan.   Exercise goals: Go to the gym 2 days per week.  Behavioral modification strategies: increasing lean protein intake.  [name] has agreed to follow-up with our clinic in 3 weeks. She was informed of the importance of frequent follow-up visits to maximize her success with intensive lifestyle modifications for her multiple health conditions.   Objective:   Blood pressure 118/75, pulse  80, temperature 98.1 F (36.7 C), height 5\' 3"  (1.6 m), weight 209 lb (94.8 kg), SpO2 99 %. Body mass index is 37.02 kg/m.  General: Cooperative, alert, well developed, in no acute distress. HEENT: Conjunctivae and lids unremarkable. Cardiovascular: Regular rhythm.  Lungs: Normal work of breathing. Neurologic: No focal deficits.   Lab Results  Component Value Date   CREATININE 0.68 10/23/2021   BUN 14 10/23/2021   NA 134 10/23/2021   K 4.5 10/23/2021   CL 98 10/23/2021   CO2 24 10/23/2021   Lab Results  Component Value Date   ALT 9 10/23/2021   AST 14 10/23/2021   ALKPHOS 70 10/23/2021   BILITOT 0.3 10/23/2021   Lab Results  Component Value Date   HGBA1C 5.6 10/23/2021   HGBA1C 5.5 05/08/2021   HGBA1C 5.6 05/29/2020   HGBA1C 5.4 11/27/2019   HGBA1C 5.4 06/27/2019   Lab Results  Component Value Date   INSULIN 9.3 10/23/2021   INSULIN 22.8 05/08/2021   INSULIN 15.2 05/29/2020   INSULIN 10.3 11/27/2019   INSULIN 12.5 06/27/2019   Lab Results  Component Value Date   TSH 3.820 05/07/2021   Lab Results  Component Value Date   CHOL 188 10/23/2021   HDL 40 10/23/2021   LDLCALC 126 (H) 10/23/2021   TRIG 124 10/23/2021   CHOLHDL 4.4 11/30/2018   Lab Results  Component Value Date   VD25OH 39.1 10/23/2021   VD25OH 39.3 05/08/2021   VD25OH 37.5 05/29/2020   Lab Results  Component Value Date   WBC 11.3 (H) 05/07/2021   HGB 15.3 05/07/2021   HCT 45.6 05/07/2021   MCV 86 05/07/2021   PLT 409 05/07/2021   No results found for: IRON, TIBC, FERRITIN  Attestation Statements:   Reviewed by clinician on day of visit: allergies, medications, problem list, medical history, surgical history, family history, social history, and previous encounter notes.   I, Trixie Dredge, am acting as transcriptionist for Dennard Nip, MD.  I have reviewed the above documentation for accuracy and completeness, and I agree with the above. -  Dennard Nip, MD

## 2021-12-30 ENCOUNTER — Encounter: Payer: Self-pay | Admitting: Cardiovascular Disease

## 2021-12-30 NOTE — Progress Notes (Signed)
Cardiology Office Note   Date:  12/31/2021   ID:  [name], [name] 11-Jul-1971, MRN [id number]  PCP:  Susy Frizzle, MD  Cardiologist:   Mertie Moores, MD   Chief Complaint  Patient presents with   Congestive Heart Failure        Problem List 1.  Diastolic dysfunction 2. Hypertension 3. Obesity 4. Palpitations  5. Obstructive sleep apnea - has not been using her CPAP, Needs to see a pulmonologist to get another mask.    [name] is a 51 y.o. female who presents for  Further evaluation of her shortness breath and palpitations. Has had palpitations for several months.   Seem to be worse at night , while watching TV.  Also has dyspnea - primarily at rest.   Not related to lying down. Has DOE Works out at [employer] , has been going for the past month. Has had shortness of breath since getting back to the gym.    Was given lasix 20 as needed - takes it once every 1-2 months.   Seems to need to take the lasix after she has eaten more salty foods than she should .    She knows that she should limit salt intake but has not made any real effort.   Has lost 4 lbs since Jan. 1.  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    Husband is a Hotel manager.   2 boys at home,  Age 36 and 15.   Sept. 7, 2017: [name] is seen for a follow up visit This past Saturday ,  Has had some hot flashes / sweats  HR has been very high for the past several weeks.  (is on adderall)  No diarrhea. No weight loss.   Has not been using her CPAP mask - ends up taking the CPAP mask off after 15 minutes.   Oct. 9, 2017:  [name] is seen back for follow up of Her chronic diastolic congestive heart failure. She was seen in Sept.  We stopped the Bystolic and started Metoprolol 50 BID  Has been checking BP , Now works at [employer].   Mar 17, 2018: [name] is seen back today for further evaluation of her sinus tachycardia and diastolic heart  failure. Had partial left knee replacement [name] )  Occasional episodes of shortness of breath.   Is walking at lunch - gets a little short of breath  Is making progress with her walking   Wt today is 228 lbs.   December 01, 2019:  [name] is seen back today for follow-up of her palpitations and chronic diastolic congestive heart failure. Wt. Today is 203 lbs ( down 25 lbs from last visit)  Rare CP.   Some palpitations  Works at [employer] ( now owned by [employer])  Walking regularly ,   Working on diet .   Is having a new CPAP mask made.    February 10, 2021:  [name] is seen today for follow p of her palpitations and chronic diastolic CHF Has OSA Had covid in jan. Wt today is 211  Up 8 lbs from last yer  Is  exercising  Event monitor showed :   Sinus rhythm with occasional PVCs.  Diastolic BP is elevated.   Sees Dr. Leafy Ro at [facility] program    May 07, 2021: [name] is seen today for a follow-up visit.  Approximately 1  week ago her husband found her passed out on the commode.  He tried to shake her and arouse her but she was unresponsive.  They called EMS and took her to the hospital.  She was found to have profound hypokalemia with a potassium of 2.7. She was given IV fluids, oral potassium, IV potassium.  Her white blood cell count was elevated at 12.5.  She denies any dysuria or urgency.  She denies any blood in her stool or abdominal pain, cramps.  She denies any sore throat. No COVID symptoms.  Wt is 208 lbs.   HR has been elevated since her visit to the hospital  No TSH recently  Taking lasix 20 mg PRN ( usually this is twice a week)    Previous work-up includes a right left heart catheterization in October, 2019.  She had normal coronary arteries.  She had normal LVEF and normal cardiac output by Fick calculations.  Tries to avoid salt - has done better   Echo from December, 2016 shows normal left ventricular systolic function.  She has  grade 1 diastolic dysfunction.   Feb. 15, 2023 [name] is seen for follow up of her CHF, dyspnea  She has grade 1 DD We changed her lasix to spiro at her last visit Notices she does not urinate as much And feels weak after taking lasix and diuresing  Wt is 215. ( Up 7 lbs from last visit )   Past Medical History:  Diagnosis Date   Anxiety    Asthma    Back pain    Chest pain    Chronic CHF ([place])    Dyspnea    Echocardiogram 10/25/2015   EF 60-65, normal wall motion, grade 1 diastolic dysfunction, GLS -19.1%   Edema of left lower extremity    Fracture dislocation of elbow joint 01/15/2013   right   GERD (gastroesophageal reflux disease)    Omeprazole as needed, none in 6 mos.   Hyperlipidemia    Hypertension    Joint pain    Obesity    Palpitations    Prediabetes    R/L Cardiac catheterization 08/18/2018   Normal coronary arteries, mean RA 6, mean PA 17, PCWP 10   Seborrheic dermatitis    Sleep apnea    uses CPAP "most nights"    Past Surgical History:  Procedure Laterality Date   CESAREAN SECTION  1998; 10/10/2000   x 2   KNEE ARTHROSCOPY Left 1991   also in 2019   [place]  11/02/2006   LAPAROSCOPIC LYSIS OF ADHESIONS  03/06/2005   RADIAL HEAD ARTHROPLASTY Right 01/17/2013   Procedure: RIGHT RADIAL HEAD ARTHROPLASTY OPEN REDUCTION INTERNAL FIXATION CORONOID ;  Surgeon: Tennis Must, MD;  Location: [facility];  Service: Orthopedics;  Laterality: Right;   RIGHT/LEFT HEART CATH AND CORONARY ANGIOGRAPHY N/A 08/18/2018   Procedure: RIGHT/LEFT HEART CATH AND CORONARY ANGIOGRAPHY;  Surgeon: Nelva Bush, MD;  Location: [facility];  Service: Cardiovascular;  Laterality: N/A;   TUBAL LIGATION  03/06/2005     Current Outpatient Medications  Medication Sig Dispense Refill   albuterol (PROVENTIL HFA;VENTOLIN HFA) 108 (90 Base) MCG/ACT inhaler Inhale 2 puffs into the lungs every 6 (six) hours as needed for wheezing.  1 Inhaler 11   ALPRAZolam (XANAX) 0.5 MG tablet Take 1 tablet (0.5 mg total) by mouth 3 (three) times daily as needed. 45 tablet 0   amphetamine-dextroamphetamine (ADDERALL XR) 25 MG 24 hr capsule Take 1  capsule by mouth every morning. 30 capsule 0   clobetasol (TEMOVATE) 0.05 % external solution APPLY 2 TIMES A DAY TO SCALP AS NEEDED FOR FLARES 50 mL 11   escitalopram (LEXAPRO) 10 MG tablet Take 1 tablet (10 mg total) by mouth daily. 30 tablet 3   ibuprofen (ADVIL,MOTRIN) 200 MG tablet Take 400 mg by mouth 2 (two) times daily as needed for headache or moderate pain.     Insulin Pen Needle (UNIFINE PENTIPS) 32G X 4 MM MISC Use 2 times a day as directed 100 each 0   Insulin Pen Needle 32G X 4 MM MISC USE ONCE DAILY TO INJECT SAXENDA. 100 each 0   ketoconazole (NIZORAL) 2 % shampoo APPLY 1 APPLICATION ON THE SKIN AS DIRECTED; LATHER AND LET SIT FOR A FEW MINUTES PRIOR TO RINSING WHEN YOU SHOWER. 120 mL 11   Liraglutide -Weight Management 18 MG/3ML SOPN Inject 3 mg into the skin daily. (Patient taking differently: Inject 1.8 mg into the skin daily.) 15 mL 0   metFORMIN (GLUCOPHAGE) 500 MG tablet Take 1 tablet (500 mg total) by mouth in the morning, at noon, and at bedtime. 90 tablet 0   metoprolol tartrate (LOPRESSOR) 50 MG tablet Take 1 tablet (50 mg total) by mouth 2 (two) times daily. 180 tablet 3   spironolactone (ALDACTONE) 25 MG tablet Take 1 tablet (25 mg total) by mouth 3 (three) times a week. 45 tablet 3   Vitamin D, Ergocalciferol, (DRISDOL) 1.25 MG (50000 UNIT) CAPS capsule Take 1 capsule by mouth every 7 days. 4 capsule 0   No current facility-administered medications for this visit.    Allergies:   Adhesive [tape] and Latex    Social History:  The patient  reports that she has never smoked. She has been exposed to tobacco smoke. She has never used smokeless tobacco. She reports that she does not drink alcohol and does not use drugs.   Family History:  The patient's family history  includes Diabetes in her maternal grandmother, mother, paternal grandmother, and sister; Heart disease in her maternal grandfather, maternal grandmother, maternal uncle, and mother; Heart disease (age of onset: 43) in her father; Hypertension in her mother; Liver disease in her paternal aunt; Obesity in her mother; Ovarian cancer in her maternal grandmother; Sudden death in her father.    ROS:   Noted in current history, otherwise review of systems is negative.  Physical Exam: Blood pressure 130/78, pulse 93, height 5' 2.5" (1.588 m), weight 215 lb (97.5 kg), SpO2 97 %.  GEN: Middle-aged female, moderately obese. HEENT: Normal NECK: No JVD; No carotid bruits LYMPHATICS: No lymphadenopathy CARDIAC: RRR , no murmurs, rubs, gallops RESPIRATORY:  Clear to auscultation without rales, wheezing or rhonchi  ABDOMEN: Soft, non-tender, non-distended MUSCULOSKELETAL:  trace leg edema .  No deformity  SKIN: Warm and dry NEUROLOGIC:  Alert and oriented x 3   EKG:       Recent Labs: 05/07/2021: Hemoglobin 15.3; Platelets 409; TSH 3.820 10/23/2021: ALT 9; BUN 14; Creatinine, Ser 0.68; Potassium 4.5; Sodium 134    Lipid Panel    Component Value Date/Time   CHOL 188 10/23/2021 0823   TRIG 124 10/23/2021 0823   HDL 40 10/23/2021 0823   CHOLHDL 4.4 11/30/2018 1047   CHOLHDL 4.2 12/24/2017 0818   VLDL 22 03/11/2015 0847   LDLCALC 126 (H) 10/23/2021 0823   LDLCALC 134 (H) 12/24/2017 0818      Wt Readings from Last 3 Encounters:  12/31/21 215 lb (  97.5 kg)  12/23/21 209 lb (94.8 kg)  11/25/21 210 lb (95.3 kg)      Other studies Reviewed: Additional studies/ records that were reviewed today include: . Review of the above records demonstrates:    ASSESSMENT AND PLAN:  1.   Syncope: She has not had any recent episodes of syncope.    2.  Chronic diastolic congestive heart failure:  She has chronic diastolic congestive heart failure.  We will add Entresto 24-26 mg twice a day. This  may also assist in her diuresis.    Basic metabolic profile in 3 weeks.  We will see her back in the office in several months to assess whether or not she needs Lasix several times a week.   Ive advised her to avoid processed meats     Current medicines are reviewed at length with the patient today.  The patient does not have concerns regarding medicines.  The following changes have been made:  no change  Labs/ tests ordered today include:   No orders of the defined types were placed in this encounter.     Mertie Moores, MD  12/31/2021 4:29 PM    [address], [place], [place]  [address] Phone: [phone]; Fax: [phone]

## 2021-12-31 ENCOUNTER — Other Ambulatory Visit (HOSPITAL_COMMUNITY): Payer: Self-pay

## 2021-12-31 ENCOUNTER — Encounter: Payer: Self-pay | Admitting: Cardiovascular Disease

## 2021-12-31 ENCOUNTER — Other Ambulatory Visit: Payer: Self-pay

## 2021-12-31 ENCOUNTER — Ambulatory Visit: Payer: 59 | Admitting: Cardiovascular Disease

## 2021-12-31 VITALS — BP 130/78 | HR 93 | Ht 62.5 in | Wt 215.0 lb

## 2021-12-31 DIAGNOSIS — Z79899 Other long term (current) drug therapy: Secondary | ICD-10-CM

## 2021-12-31 DIAGNOSIS — I10 Essential (primary) hypertension: Secondary | ICD-10-CM

## 2021-12-31 DIAGNOSIS — I5032 Chronic diastolic (congestive) heart failure: Secondary | ICD-10-CM | POA: Diagnosis not present

## 2021-12-31 MED ORDER — SACUBITRIL-VALSARTAN 24-26 MG PO TABS
1.0000 | ORAL_TABLET | Freq: Two times a day (BID) | ORAL | 6 refills | Status: DC
  Filled 2021-12-31: qty 60, 30d supply, fill #0
  Filled 2022-03-19: qty 60, 30d supply, fill #1
  Filled 2022-05-09 – 2022-06-15 (×2): qty 60, 30d supply, fill #2
  Filled 2022-09-02: qty 60, 30d supply, fill #3
  Filled 2022-12-16: qty 60, 30d supply, fill #4

## 2021-12-31 NOTE — Patient Instructions (Signed)
Medication Instructions:  Please start Entresto 24-26 mg one tablet twice a day. Continue all other medications as listed.  *If you need a refill on your cardiac medications before your next appointment, please call your pharmacy*  Lab Work: Please have blood work in 3 weeks (BMP)  If you have labs (blood work) drawn today and your tests are completely normal, you will receive your results only by: [place] (if you have MyChart) OR A paper copy in the mail If you have any lab test that is abnormal or we need to change your treatment, we will call you to review the results.  Follow-Up: At [facility], you and your health needs are our priority.  As part of our continuing mission to provide you with exceptional heart care, we have created designated Provider Care Teams.  These Care Teams include your primary Cardiologist (physician) and Advanced Practice Providers (APPs -  Physician Assistants and Nurse Practitioners) who all work together to provide you with the care you need, when you need it.  We recommend signing up for the patient portal called "MyChart".  Sign up information is provided on this After Visit Summary.  MyChart is used to connect with patients for Virtual Visits (Telemedicine).  Patients are able to view lab/test results, encounter notes, upcoming appointments, etc.  Non-urgent messages can be sent to your provider as well.   To learn more about what you can do with MyChart, go to [email or web address].    Your next appointment:   3 month(s)  The format for your next appointment:   In Person  Provider:   Mertie Moores, MD {  Thank you for choosing [facility]!!     Sacubitril; Valsartan Oral Tablets What is this medication? SACUBITRIL; VALSARTAN (sak UE bi tril; val SAR tan) is a combination of a neprilysin inhibitor and a an angiotensin II receptor blocker. It treats heart failure. This medicine may be used for other purposes; ask your  health care provider or pharmacist if you have questions. COMMON BRAND NAME(S): Entresto What should I tell my care team before I take this medication? They need to know if you have any of these conditions: diabetes and take a medicine that contains aliskiren high levels of potassium in the blood kidney disease liver disease low blood pressure an unusual or allergic reaction to sacubitril; valsartan, drugs called angiotensin converting enzyme (ACE) inhibitors, angiotensin II receptor blockers (ARBs), other medicines, foods, dyes, or preservatives pregnant or trying to get pregnant breast-feeding How should I use this medication? Take this medicine by mouth. Take it as directed on the prescription label at the same time every day. You can take it with or without food. If it upsets your stomach, take it with food. Keep taking it unless your health care provider tells you to stop. Talk to your health care provider about the use of this drug in children. While it may be prescribed for children as young as 1 for selected conditions, precautions do apply. Overdosage: If you think you have taken too much of this medicine contact a poison control center or emergency room at once. NOTE: This medicine is only for you. Do not share this medicine with others. What if I miss a dose? If you miss a dose, take it as soon as you can. If it is almost time for your next dose, take only that dose. Do not take double or extra doses. What may interact with this medication? Do not take this medicine with  any of the following medicines: aliskiren if you have diabetes angiotensin-converting enzyme (ACE) inhibitors, like benazepril, captopril, enalapril, fosinopril, lisinopril, or ramipril tranylcypromine This medicine may also interact with the following medicines: angiotensin II receptor blockers (ARBs) like azilsartan, candesartan, eprosartan, irbesartan, losartan, olmesartan, telmisartan, or  valsartan celecoxib lithium NSAIDS, medicines for pain and inflammation, like ibuprofen or naproxen potassium-sparing diuretics like amiloride, spironolactone, and triamterene potassium supplements This list may not describe all possible interactions. Give your health care provider a list of all the medicines, herbs, non-prescription drugs, or dietary supplements you use. Also tell them if you smoke, drink alcohol, or use illegal drugs. Some items may interact with your medicine. What should I watch for while using this medication? Tell your doctor or health care provider if your symptoms do not start to get better or if they get worse. Do not become pregnant while taking this medicine. Women should inform their health care provider if they wish to become pregnant or think they might be pregnant. There is a potential for serious harm to an unborn child. Talk to your health care provider for more information. You may get drowsy or dizzy. Do not drive, use machinery, or do anything that needs mental alertness until you know how this medicine affects you. Do not stand or sit up quickly, especially if you are an older patient. This reduces the risk of dizzy or fainting spells. Alcohol may interfere with the effects of this medicine. Avoid alcoholic drinks. Avoid salt substitutes unless you are told otherwise by your health care provider. What side effects may I notice from receiving this medication? Side effects that you should report to your doctor or health care provider as soon as possible: allergic reactions (skin rash, itching or hives; swelling of the face, lips, or tongue) high potassium levels (chest pain; fast, irregular heartbeat; muscle weakness) kidney injury (trouble passing urine or change in the amount of urine) low blood pressure (dizziness; feeling faint or lightheaded, falls; unusually weak or tired) Side effects that usually do not require medical attention (report to your doctor or  health care provider if they continue or are bothersome): cough This list may not describe all possible side effects. Call your doctor for medical advice about side effects. You may report side effects to FDA at 1-800-FDA-1088. Where should I keep my medication? Keep out of the reach of children and pets. Store at room temperature between 20 and 25 degrees C (68 and 77 degrees F). Protect from moisture. Keep the container tightly closed. Get rid of any unused medicine after the expiration date. To get rid of medicines that are no longer needed or have expired: Take the medicine to a take-back program. Check with your pharmacy or law enforcement to find a location. If you cannot return the medicine, check the label or package insert to see if the medicine should be thrown out in the garbage or flushed down the toilet. If you are not sure, ask your health care provider. If it is safe to put it in the trash, empty the medicine out of the container. Mix the medicine with cat litter, dirt, coffee grounds, or other unwanted substance. Seal the mixture in a bag or container. Put it in the trash. NOTE: This sheet is a summary. It may not cover all possible information. If you have questions about this medicine, talk to your doctor, pharmacist, or health care provider.  2022 Elsevier/Gold Standard (2021-07-22 00:00:00)

## 2022-01-08 ENCOUNTER — Other Ambulatory Visit: Payer: Self-pay | Admitting: Family Medicine

## 2022-01-08 ENCOUNTER — Other Ambulatory Visit (HOSPITAL_COMMUNITY): Payer: Self-pay

## 2022-01-08 MED ORDER — AMPHETAMINE-DEXTROAMPHET ER 25 MG PO CP24
25.0000 mg | ORAL_CAPSULE | Freq: Every morning | ORAL | 0 refills | Status: DC
  Filled 2022-01-08: qty 30, 30d supply, fill #0

## 2022-01-08 NOTE — Telephone Encounter (Signed)
LOV 05/09/21 Last refill 12/12/21, #30, 0 refills  Please review, thanks!

## 2022-01-12 ENCOUNTER — Other Ambulatory Visit (HOSPITAL_COMMUNITY): Payer: Self-pay

## 2022-01-12 DIAGNOSIS — N9089 Other specified noninflammatory disorders of vulva and perineum: Secondary | ICD-10-CM | POA: Diagnosis not present

## 2022-01-12 DIAGNOSIS — N941 Unspecified dyspareunia: Secondary | ICD-10-CM | POA: Diagnosis not present

## 2022-01-12 DIAGNOSIS — N760 Acute vaginitis: Secondary | ICD-10-CM | POA: Diagnosis not present

## 2022-01-12 DIAGNOSIS — N952 Postmenopausal atrophic vaginitis: Secondary | ICD-10-CM | POA: Diagnosis not present

## 2022-01-12 MED ORDER — ESTRADIOL 0.1 MG/GM VA CREA
1.0000 g | TOPICAL_CREAM | VAGINAL | 3 refills | Status: DC
  Filled 2022-01-12: qty 42.5, 42d supply, fill #0

## 2022-01-12 MED ORDER — SULFAMETHOXAZOLE-TRIMETHOPRIM 800-160 MG PO TABS
1.0000 | ORAL_TABLET | Freq: Two times a day (BID) | ORAL | 0 refills | Status: DC
  Filled 2022-01-12: qty 14, 7d supply, fill #0

## 2022-01-13 ENCOUNTER — Other Ambulatory Visit (HOSPITAL_COMMUNITY): Payer: Self-pay

## 2022-01-13 MED ORDER — METRONIDAZOLE 0.75 % VA GEL
1.0000 | Freq: Every day | VAGINAL | 0 refills | Status: DC
  Filled 2022-01-13: qty 70, 5d supply, fill #0

## 2022-01-13 MED ORDER — ESTRADIOL 0.1 MG/GM VA CREA
1.0000 g | TOPICAL_CREAM | VAGINAL | 3 refills | Status: DC
  Filled 2022-01-13 – 2022-01-14 (×3): qty 42.5, 90d supply, fill #0

## 2022-01-14 ENCOUNTER — Other Ambulatory Visit (HOSPITAL_COMMUNITY): Payer: Self-pay

## 2022-01-15 ENCOUNTER — Other Ambulatory Visit (HOSPITAL_COMMUNITY): Payer: Self-pay

## 2022-01-16 ENCOUNTER — Other Ambulatory Visit (HOSPITAL_COMMUNITY): Payer: Self-pay

## 2022-01-19 ENCOUNTER — Encounter (INDEPENDENT_AMBULATORY_CARE_PROVIDER_SITE_OTHER): Payer: Self-pay

## 2022-01-21 ENCOUNTER — Other Ambulatory Visit: Payer: Self-pay

## 2022-01-21 ENCOUNTER — Ambulatory Visit (INDEPENDENT_AMBULATORY_CARE_PROVIDER_SITE_OTHER): Payer: 59 | Admitting: Family Medicine

## 2022-01-21 ENCOUNTER — Encounter (INDEPENDENT_AMBULATORY_CARE_PROVIDER_SITE_OTHER): Payer: Self-pay | Admitting: Family Medicine

## 2022-01-21 VITALS — BP 107/68 | HR 73 | Temp 98.0°F | Ht 63.0 in | Wt 215.0 lb

## 2022-01-21 DIAGNOSIS — E559 Vitamin D deficiency, unspecified: Secondary | ICD-10-CM

## 2022-01-21 DIAGNOSIS — I5032 Chronic diastolic (congestive) heart failure: Secondary | ICD-10-CM | POA: Diagnosis not present

## 2022-01-21 DIAGNOSIS — R7303 Prediabetes: Secondary | ICD-10-CM | POA: Diagnosis not present

## 2022-01-21 DIAGNOSIS — E669 Obesity, unspecified: Secondary | ICD-10-CM | POA: Diagnosis not present

## 2022-01-21 DIAGNOSIS — Z6841 Body Mass Index (BMI) 40.0 and over, adult: Secondary | ICD-10-CM

## 2022-01-21 DIAGNOSIS — Z6837 Body mass index (BMI) 37.0-37.9, adult: Secondary | ICD-10-CM

## 2022-01-21 DIAGNOSIS — Z9189 Other specified personal risk factors, not elsewhere classified: Secondary | ICD-10-CM

## 2022-01-22 ENCOUNTER — Other Ambulatory Visit (HOSPITAL_COMMUNITY): Payer: Self-pay

## 2022-01-22 MED ORDER — VITAMIN D (ERGOCALCIFEROL) 1.25 MG (50000 UNIT) PO CAPS
50000.0000 [IU] | ORAL_CAPSULE | ORAL | 0 refills | Status: DC
  Filled 2022-01-22: qty 4, 28d supply, fill #0

## 2022-01-22 MED ORDER — WEGOVY 1 MG/0.5ML ~~LOC~~ SOAJ
1.0000 mg | SUBCUTANEOUS | 0 refills | Status: DC
  Filled 2022-01-22 – 2022-02-05 (×2): qty 2, 28d supply, fill #0

## 2022-01-22 MED ORDER — METFORMIN HCL 500 MG PO TABS
500.0000 mg | ORAL_TABLET | Freq: Three times a day (TID) | ORAL | 0 refills | Status: DC
Start: 2022-01-22 — End: 2022-06-25
  Filled 2022-01-22: qty 90, 30d supply, fill #0

## 2022-01-22 NOTE — Progress Notes (Signed)
? ? ? ?Chief Complaint:  ? ?OBESITY ?[name] is here to discuss her progress with her obesity treatment plan along with follow-up of her obesity related diagnoses. [name] is on the Category 3 Plan and states she is following her eating plan approximately 80-85% of the time. [name] states she is walking for 25 minutes 3-4 times per week. ? ?Today's visit was #: 18 ?Starting weight: 235 lbs ?Starting date: 11/30/2018 ?Today's weight: 209 lbs ?Today's date: 01/21/2022 ?Total lbs lost to date: 3 ?Total lbs lost since last in-office visit: 0 ? ?Interim History: [name] has maintained her weight since her last visit. She is taking Saxenda 1.8 mg. She denies side effects. She is not skipping meals. She is struggling with hunger all the time and cravings around 2-3 pm. ? ?Subjective:  ? ?1. Vitamin D deficiency ?[name] is taking Vit D 50,000 IU weekly, and she denies side effects, or nausea, vomiting, or muscle weakness. ? ?2. Pre-diabetes ?[name] is taking metformin 500 mg BID, and she forgets to take her lunch dose. She denies side effects. Last A1c was 5.6. ? ?3. Chronic diastolic CHF (congestive heart failure) ([place]) ?[name] saw Cardiology last on 12/31/2021. She was started on Entresto, and she denies side effects.  ? ?4. At risk for diabetes mellitus ?[name] is at higher than average risk for developing diabetes due to her obesity. ? ?Assessment/Plan:  ? ?1. Vitamin D deficiency ?We will refill prescription Vitamin D for 1 month. Side effects were discussed. Armina will follow-up for routine testing of Vitamin D, at least 2-3 times per year to avoid over-replacement. ? ?- Vitamin D, Ergocalciferol, (DRISDOL) 1.25 MG (50000 UNIT) CAPS capsule; Take 1 capsule by mouth every 7 days.  Dispense: 4 capsule; Refill: 0 ? ?2. Pre-diabetes ?We will refill metformin 500 mg TID for 1 month. Side effects were discussed. Nancy will continue working on dietary changes, exercise, and weight loss to help decrease the risk of  diabetes.  ? ?- metFORMIN (GLUCOPHAGE) 500 MG tablet; Take 1 tablet by mouth in the morning, at noon, and at bedtime.  Dispense: 90 tablet; Refill: 0 ? ?3. Chronic diastolic CHF (congestive heart failure) ([place]) ?Adine is scheduled for BMP on 01/23/2022. She will continue to follow up with Cardiology. ? ?4. At risk for diabetes mellitus ?[name] was given approximately 15 minutes of diabetic education and counseling today. We discussed intensive lifestyle modifications today with an emphasis on weight loss as well as increasing exercise and decreasing simple carbohydrates in her diet. We also reviewed medication options with an emphasis on risk versus benefits of those discussed. ? ?Repetitive spaced learning was employed today to elicit superior memory formation and behavioral change. ? ?5. Obesity with current BMI of 37.2 ?Lawson is currently in the action stage of change. As such, her goal is to continue with weight loss efforts. She has agreed to the Category 3 Plan.  ? ?Additional Category 3 breakfast options were given. ? ?We discussed various medication options to help [name] with her weight loss efforts and we both agreed to discontinue Saxenda, and start Wegovy 1 mg weekly with no refills. Side effects were discussed. Prior authorization needed. ? ?- Semaglutide-Weight Management (WEGOVY) 1 MG/0.5ML SOAJ; Inject 1 mg into the skin once a week.  Dispense: 2 mL; Refill: 0 ? ?Exercise goals: As is. ? ?Behavioral modification strategies: increasing lean protein intake, increasing water intake, no skipping meals, and meal planning and cooking strategies. ? ?[name] has agreed to follow-up with our clinic in 3  to 4 weeks. She was informed of the importance of frequent follow-up visits to maximize her success with intensive lifestyle modifications for her multiple health conditions.  ? ?Objective:  ? ?Blood pressure 107/68, pulse 73, temperature 98 ?F (36.7 ?C), height '5\' 3"'$  (1.6 m), weight 215 lb (97.5 kg),  SpO2 99 %. ?Body mass index is 38.09 kg/m?. ? ?General: Cooperative, alert, well developed, in no acute distress. ?HEENT: Conjunctivae and lids unremarkable. ?Cardiovascular: Regular rhythm.  ?Lungs: Normal work of breathing. ?Neurologic: No focal deficits.  ? ?Lab Results  ?Component Value Date  ? CREATININE 0.68 10/23/2021  ? BUN 14 10/23/2021  ? NA 134 10/23/2021  ? K 4.5 10/23/2021  ? CL 98 10/23/2021  ? CO2 24 10/23/2021  ? ?Lab Results  ?Component Value Date  ? ALT 9 10/23/2021  ? AST 14 10/23/2021  ? ALKPHOS 70 10/23/2021  ? BILITOT 0.3 10/23/2021  ? ?Lab Results  ?Component Value Date  ? HGBA1C 5.6 10/23/2021  ? HGBA1C 5.5 05/08/2021  ? HGBA1C 5.6 05/29/2020  ? HGBA1C 5.4 11/27/2019  ? HGBA1C 5.4 06/27/2019  ? ?Lab Results  ?Component Value Date  ? INSULIN 9.3 10/23/2021  ? INSULIN 22.8 05/08/2021  ? INSULIN 15.2 05/29/2020  ? INSULIN 10.3 11/27/2019  ? INSULIN 12.5 06/27/2019  ? ?Lab Results  ?Component Value Date  ? TSH 3.820 05/07/2021  ? ?Lab Results  ?Component Value Date  ? CHOL 188 10/23/2021  ? HDL 40 10/23/2021  ? LDLCALC 126 (H) 10/23/2021  ? TRIG 124 10/23/2021  ? CHOLHDL 4.4 11/30/2018  ? ?Lab Results  ?Component Value Date  ? VD25OH 39.1 10/23/2021  ? VD25OH 39.3 05/08/2021  ? VD25OH 37.5 05/29/2020  ? ?Lab Results  ?Component Value Date  ? WBC 11.3 (H) 05/07/2021  ? HGB 15.3 05/07/2021  ? HCT 45.6 05/07/2021  ? MCV 86 05/07/2021  ? PLT 409 05/07/2021  ? ?No results found for: IRON, TIBC, FERRITIN ? ?Attestation Statements:  ? ?Reviewed by clinician on day of visit: allergies, medications, problem list, medical history, surgical history, family history, social history, and previous encounter notes. ? ? ?I, Trixie Dredge, am acting as transcriptionist for Dennard Nip, MD. ? ?I have reviewed the above documentation for accuracy and completeness, and I agree with the above. -  Dennard Nip, MD ? ? ?

## 2022-01-23 ENCOUNTER — Other Ambulatory Visit: Payer: 59 | Admitting: *Deleted

## 2022-01-23 ENCOUNTER — Emergency Department (HOSPITAL_BASED_OUTPATIENT_CLINIC_OR_DEPARTMENT_OTHER)
Admission: EM | Admit: 2022-01-23 | Discharge: 2022-01-24 | Disposition: A | Discharge status: Home / Self Care | Payer: 59 | Attending: Emergency Medicine | Admitting: Emergency Medicine

## 2022-01-23 ENCOUNTER — Emergency Department (HOSPITAL_BASED_OUTPATIENT_CLINIC_OR_DEPARTMENT_OTHER): Payer: 59

## 2022-01-23 ENCOUNTER — Other Ambulatory Visit: Payer: Self-pay

## 2022-01-23 ENCOUNTER — Encounter (HOSPITAL_BASED_OUTPATIENT_CLINIC_OR_DEPARTMENT_OTHER): Payer: Self-pay

## 2022-01-23 DIAGNOSIS — Z794 Long term (current) use of insulin: Secondary | ICD-10-CM | POA: Diagnosis not present

## 2022-01-23 DIAGNOSIS — Z91040 Latex allergy status: Secondary | ICD-10-CM | POA: Diagnosis not present

## 2022-01-23 DIAGNOSIS — R072 Precordial pain: Secondary | ICD-10-CM | POA: Insufficient documentation

## 2022-01-23 DIAGNOSIS — I110 Hypertensive heart disease with heart failure: Secondary | ICD-10-CM | POA: Diagnosis not present

## 2022-01-23 DIAGNOSIS — I5030 Unspecified diastolic (congestive) heart failure: Secondary | ICD-10-CM | POA: Insufficient documentation

## 2022-01-23 DIAGNOSIS — Z79899 Other long term (current) drug therapy: Secondary | ICD-10-CM | POA: Insufficient documentation

## 2022-01-23 DIAGNOSIS — R0789 Other chest pain: Secondary | ICD-10-CM | POA: Diagnosis not present

## 2022-01-23 DIAGNOSIS — I10 Essential (primary) hypertension: Secondary | ICD-10-CM

## 2022-01-23 DIAGNOSIS — R079 Chest pain, unspecified: Secondary | ICD-10-CM

## 2022-01-23 DIAGNOSIS — I5032 Chronic diastolic (congestive) heart failure: Secondary | ICD-10-CM | POA: Diagnosis not present

## 2022-01-23 LAB — CBC
HCT: 44.7 % (ref 36.0–46.0)
Hemoglobin: 14.6 g/dL (ref 12.0–15.0)
MCH: 28.6 pg (ref 26.0–34.0)
MCHC: 32.7 g/dL (ref 30.0–36.0)
MCV: 87.5 fL (ref 80.0–100.0)
Platelets: 402 10*3/uL — ABNORMAL HIGH (ref 150–400)
RBC: 5.11 MIL/uL (ref 3.87–5.11)
RDW: 13.2 % (ref 11.5–15.5)
WBC: 11.8 10*3/uL — ABNORMAL HIGH (ref 4.0–10.5)
nRBC: 0 % (ref 0.0–0.2)

## 2022-01-23 LAB — BASIC METABOLIC PANEL
Anion gap: 8 (ref 5–15)
BUN/Creatinine Ratio: 25 — ABNORMAL HIGH (ref 9–23)
BUN: 16 mg/dL (ref 6–24)
BUN: 11 mg/dL (ref 6–20)
CO2: 26 mmol/L (ref 20–29)
CO2: 29 mmol/L (ref 22–32)
Calcium: 9.4 mg/dL (ref 8.7–10.2)
Calcium: 9.8 mg/dL (ref 8.9–10.3)
Chloride: 101 mmol/L (ref 96–106)
Chloride: 102 mmol/L (ref 98–111)
Creatinine, Ser: 0.64 mg/dL (ref 0.57–1.00)
Creatinine, Ser: 0.66 mg/dL (ref 0.44–1.00)
GFR, Estimated: >60 mL/min (ref >60)
Glucose, Bld: 83 mg/dL (ref 70–99)
Glucose: 104 mg/dL — ABNORMAL HIGH (ref 70–99)
Potassium: 4.4 mmol/L (ref 3.5–5.2)
Potassium: 4.1 mmol/L (ref 3.5–5.1)
Sodium: 139 mmol/L (ref 134–144)
Sodium: 139 mmol/L (ref 135–145)
eGFR: 109 mL/min/{1.73_m2} (ref >59)

## 2022-01-23 LAB — TROPONIN I (HIGH SENSITIVITY): Troponin I (High Sensitivity): <2 ng/L (ref <18)

## 2022-01-23 NOTE — ED Provider Notes (Signed)
? ?[facility] EMERGENCY DEPT  ?Provider Note ? ?CSN: [id number] ?Arrival date & time: 01/23/22 1857 ? ?History ?Chief Complaint  ?Patient presents with  ? Chest Pain  ? ? ?[name] is a 51 y.o. female with history of obesity, OSA, diastolic HF and HTN, reports intermittent sharp mid sternal chest pains for the last 2 days, sometimes associated with SOB. No provoking or relieving factors. She gets SOB and tired with exercise. No prior history of CAD. Pain was more severe earlier but she did not want to come to the hospital until encouraged by her husband. She tried TUMS and ASA without relief. Currently pain is improved.  ? ? ?Home Medications ?Prior to Admission medications   ?Medication Sig Start Date End Date Taking? Authorizing Provider  ?albuterol (PROVENTIL HFA;VENTOLIN HFA) 108 (90 Base) MCG/ACT inhaler Inhale 2 puffs into the lungs every 6 (six) hours as needed for wheezing. 12/02/15   Orlena Marthe Dant, PA-C  ?ALPRAZolam (XANAX) 0.5 MG tablet Take 1 tablet (0.5 mg total) by mouth 3 (three) times daily as needed. 09/23/21   Susy Frizzle, MD  ?amphetamine-dextroamphetamine (ADDERALL XR) 25 MG 24 hr capsule Take 1 capsule by mouth every morning. 01/08/22   Susy Frizzle, MD  ?escitalopram (LEXAPRO) 10 MG tablet Take 1 tablet (10 mg total) by mouth daily. 05/09/21   Susy Frizzle, MD  ?estradiol (ESTRACE) 0.1 MG/GM vaginal cream Place 1 gram vaginally 3 (three) times a week for 14 days, then taper to 1 gram once weekly. 01/12/22     ?estradiol (ESTRACE) 0.1 MG/GM vaginal cream Insert 1 gram vaginally 3 (three) times a week for 14 days. Taper use to 1 gram once per week for maintenance. 01/14/22     ?ibuprofen (ADVIL,MOTRIN) 200 MG tablet Take 400 mg by mouth 2 (two) times daily as needed for headache or moderate pain.    [provider]  ?Insulin Pen Needle (UNIFINE PENTIPS) 32G X 4 MM MISC Use 2 times a day as directed 10/23/21   Laqueta Linden, MD  ?Insulin Pen Needle 32G  X 4 MM MISC USE ONCE DAILY TO INJECT SAXENDA. 06/17/21 06/17/22  Dennard Nip D, MD  ?Liraglutide -Weight Management 18 MG/3ML SOPN Inject 3 mg into the skin daily. ?Patient taking differently: Inject 1.8 mg into the skin daily. 10/23/21   Laqueta Linden, MD  ?metFORMIN (GLUCOPHAGE) 500 MG tablet Take 1 tablet by mouth in the morning, at noon, and at bedtime. 01/22/22   Dennard Nip D, MD  ?metoprolol tartrate (LOPRESSOR) 50 MG tablet Take 1 tablet (50 mg total) by mouth 2 (two) times daily. 05/07/21   Nahser, Wonda Cheng, MD  ?metroNIDAZOLE (METROGEL) 0.75 % vaginal gel Place 1 Applicatorful vaginally at bedtime for 5 days 01/12/22     ?sacubitril-valsartan (ENTRESTO) 24-26 MG Take 1 tablet by mouth 2 (two) times daily. 12/31/21   Nahser, Wonda Cheng, MD  ?Semaglutide-Weight Management [facility]) 1 MG/0.5ML SOAJ Inject 1 mg into the skin once a week. 01/22/22   Dennard Nip D, MD  ?spironolactone (ALDACTONE) 25 MG tablet Take 1 tablet (25 mg total) by mouth 3 (three) times a week. 05/07/21   Nahser, Wonda Cheng, MD  ?sulfamethoxazole-trimethoprim (BACTRIM DS) 800-160 MG tablet Take 1 tablet by mouth every 12 (twelve) hours. 01/12/22     ?Vitamin D, Ergocalciferol, (DRISDOL) 1.25 MG (50000 UNIT) CAPS capsule Take 1 capsule by mouth every 7 days. 01/22/22   Starlyn Skeans, MD  ? ? ? ?Allergies    ?  Adhesive [tape] and Latex ? ? ?Review of Systems   ?Review of Systems ?Please see HPI for pertinent positives and negatives ? ?Physical Exam ?BP (!) 110/53   Pulse 77   Temp 98.4 ?F (36.9 ?C)   Resp 18   Ht '5\' 3"'$  (1.6 m)   Wt 97.5 kg   SpO2 95%   BMI 38.08 kg/m?  ? ?Physical Exam ?Vitals and nursing note reviewed.  ?Constitutional:   ?   Appearance: Normal appearance.  ?HENT:  ?   Head: Normocephalic and atraumatic.  ?   Nose: Nose normal.  ?   Mouth/Throat:  ?   Mouth: Mucous membranes are moist.  ?Eyes:  ?   Extraocular Movements: Extraocular movements intact.  ?   Conjunctiva/sclera: Conjunctivae normal.  ?Cardiovascular:  ?    Rate and Rhythm: Normal rate.  ?Pulmonary:  ?   Effort: Pulmonary effort is normal.  ?   Breath sounds: Normal breath sounds.  ?Chest:  ?   Chest wall: No tenderness.  ?Abdominal:  ?   General: Abdomen is flat.  ?   Palpations: Abdomen is soft.  ?   Tenderness: There is no abdominal tenderness.  ?Musculoskeletal:     ?   General: No swelling. Normal range of motion.  ?   Cervical back: Neck supple.  ?Skin: ?   General: Skin is warm and dry.  ?Neurological:  ?   General: No focal deficit present.  ?   Mental Status: She is alert.  ?Psychiatric:     ?   Mood and Affect: Mood normal.  ? ? ?ED Results / Procedures / Treatments   ?EKG ?EKG Interpretation ? ?Date/Time:  Friday January 23 2022 19:04:06 EST ?Ventricular Rate:  81 ?PR Interval:  158 ?QRS Duration: 84 ?QT Interval:  398 ?QTC Calculation: 462 ?R Axis:   57 ?Text Interpretation: Normal sinus rhythm Normal ECG When compared with ECG of 01-May-2021 04:51, No significant change was found Confirmed by Calvert Cantor [phone]) on 01/23/2022 11:13:47 PM ? ?Procedures ?Procedures ? ?Medications Ordered in the ED ?Medications - No data to display ? ?Initial Impression and Plan ? Patient here with atypical chest pains, off and on for two days. Some exertional component, but not consistently. Her initial labs in triage are unremarkable, CBC, BMP and Trop are neg. I personally viewed the images from radiology studies and agree with radiologist interpretation: CXR is clear. Will check second trop. Currently NSR on monitor.  ? ? ?ED Course  ? ?Clinical Course as of 01/24/22 0052  ?Sat Jan 24, 2022  ?6754 Repeat Trop remains normal. Patient would like to go home. Low risk for ACS with atypical symptoms. Advised to follow up with Cardiology for re-evaluation. RTED for any worsening pains.  [CS]  ?  ?Clinical Course User Index ?[CS] Truddie Hidden, MD  ? ? ? ?MDM Rules/Calculators/A&P ?Medical Decision Making ?Given presenting complaint, I considered that admission might be  necessary. After review of results from ED lab and/or imaging studies, admission to the hospital is not indicated at this time.  ? ? ?Problems Addressed: ?Atypical chest pain: acute illness or injury that poses a threat to life or bodily functions ? ?Amount and/or Complexity of Data Reviewed ?Labs: ordered. Decision-making details documented in ED Course. ?Radiology: ordered and independent interpretation performed. Decision-making details documented in ED Course. ?ECG/medicine tests: ordered and independent interpretation performed. Decision-making details documented in ED Course. ? ?Risk ?Decision regarding hospitalization. ? ? ? ?Final Clinical Impression(s) / ED Diagnoses ?  Final diagnoses:  ?Atypical chest pain  ? ? ?Rx / DC Orders ?ED Discharge Orders   ? ?      Ordered  ?  Ambulatory referral to Cardiology       ? 01/24/22 0051  ? ?  ?  ? ?  ? ?  ?Truddie Hidden, MD ?01/24/22 [phone] ? ?

## 2022-01-23 NOTE — ED Triage Notes (Signed)
Pt arrives POV with c/o chest pain, headache, and nausea, onset 2 days ago. Pt also states that she has felt more fatigued lately.  Pt states she took antiacid at home for pain with no relief. ?

## 2022-01-23 NOTE — ED Notes (Signed)
XRAY at Bedside ?

## 2022-01-24 LAB — TROPONIN I (HIGH SENSITIVITY): Troponin I (High Sensitivity): <2 ng/L (ref <18)

## 2022-01-28 ENCOUNTER — Telehealth (INDEPENDENT_AMBULATORY_CARE_PROVIDER_SITE_OTHER): Payer: Self-pay | Admitting: Family Medicine

## 2022-01-28 ENCOUNTER — Encounter (INDEPENDENT_AMBULATORY_CARE_PROVIDER_SITE_OTHER): Payer: Self-pay

## 2022-01-28 NOTE — Telephone Encounter (Signed)
Prior authorization approved for [facility]. Effective 01/27/22 - 04/28/22. Patient notified by mychart. ?

## 2022-01-30 ENCOUNTER — Encounter: Payer: Self-pay | Admitting: Cardiovascular Disease

## 2022-01-30 ENCOUNTER — Other Ambulatory Visit: Payer: Self-pay

## 2022-01-30 ENCOUNTER — Other Ambulatory Visit (HOSPITAL_COMMUNITY): Payer: Self-pay

## 2022-01-30 ENCOUNTER — Ambulatory Visit: Payer: 59 | Admitting: Cardiovascular Disease

## 2022-01-30 VITALS — BP 112/72 | HR 76 | Ht 63.0 in | Wt 212.6 lb

## 2022-01-30 DIAGNOSIS — R079 Chest pain, unspecified: Secondary | ICD-10-CM | POA: Diagnosis not present

## 2022-01-30 DIAGNOSIS — R0789 Other chest pain: Secondary | ICD-10-CM | POA: Diagnosis not present

## 2022-01-30 DIAGNOSIS — I5032 Chronic diastolic (congestive) heart failure: Secondary | ICD-10-CM | POA: Diagnosis not present

## 2022-01-30 MED ORDER — METOPROLOL TARTRATE 100 MG PO TABS
100.0000 mg | ORAL_TABLET | Freq: Once | ORAL | 0 refills | Status: DC
  Filled 2022-01-30: qty 1, 1d supply, fill #0

## 2022-01-30 MED ORDER — ASPIRIN EC 81 MG PO TBEC: 81.0000 mg | DELAYED_RELEASE_TABLET | Freq: Every day | ORAL | Status: DC

## 2022-01-30 MED ORDER — NITROGLYCERIN 0.4 MG SL SUBL
0.4000 mg | SUBLINGUAL_TABLET | SUBLINGUAL | 3 refills | Status: DC | PRN
  Filled 2022-01-30: qty 25, 7d supply, fill #0

## 2022-01-30 NOTE — Patient Instructions (Signed)
Medication Instructions:  ?Your physician has recommended you make the following change in your medication:  ? ?1) START Aspirin '81mg'$  once daily ?2) START Nitroglycerin 0.'4mg'$  sublingal as needed for chest pain ? ?*If you need a refill on your cardiac medications before your next appointment, please call your pharmacy* ? ?Lab Work: ?NONE ?If you have labs (blood work) drawn today and your tests are completely normal, you will receive your results only by: ?MyChart Message (if you have MyChart) OR ?A paper copy in the mail ?If you have any lab test that is abnormal or we need to change your treatment, we will call you to review the results. ? ? ?Testing/Procedures: ?Your physician has recommended for you to have a Coronary CTA. ? ?Follow-Up: ?At [facility], you and your health needs are our priority.  As part of our continuing mission to provide you with exceptional heart care, we have created designated Provider Care Teams.  These Care Teams include your primary Cardiologist (physician) and Advanced Practice Providers (APPs -  Physician Assistants and Nurse Practitioners) who all work together to provide you with the care you need, when you need it. ? ?Your next appointment:   ?3 month(s) ? ?The format for your next appointment:   ?In Person ? ?Provider:   ?Mertie Moores, MD { ? ?Other Instructions ? ? ?Your cardiac CT will be scheduled at one of the below locations:  ? ?[facility] ?[address] ?[place] [address] ?[phone] ? ?At [facility], please arrive at the [facility] and Children's Entrance (Entrance C2) of [facility] 30 minutes prior to test start time. ?You can use the FREE valet parking offered at entrance C (encouraged to control the heart rate for the test)  ?Proceed to the [facility] Radiology Department (first floor) to check-in and test prep. ? ?All radiology patients and guests should use entrance C2 at [facility], accessed from [facility], even though the hospital's physical address listed is [address]. ? ? ? ?Please follow these instructions carefully (unless otherwise directed): ? ?On the Night Before the Test: ?Be sure to Drink plenty of water. ?Do not consume any caffeinated/decaffeinated beverages or chocolate 12 hours prior to your test. ?Do not take any antihistamines 12 hours prior to your test. ? ?On the Day of the Test: ?Drink plenty of water until 1 hour prior to the test. ?Do not eat any food 4 hours prior to the test. ?You may take your regular medications prior to the test.  ?Take metoprolol (Lopressor) two hours prior to test. ?HOLD Furosemide/Hydrochlorothiazide morning of the test. ?FEMALES- please wear underwire-free bra if available, avoid dresses & tight clothing ?     ?After the Test: ?Drink plenty of water. ?After receiving IV contrast, you may experience a mild flushed feeling. This is normal. ?On occasion, you may experience a mild rash up to 24 hours after the test. This is not dangerous. If this occurs, you can take Benadryl 25 mg and increase your fluid intake. ?If you experience trouble breathing, this can be serious. If it is severe call 911 IMMEDIATELY. If it is mild, please call our office. ?If you take any of these medications: Glipizide/Metformin, Avandament, Glucavance, please do not take 48 hours after completing test unless otherwise instructed. ? ?We will call to schedule your test 2-4 weeks out understanding that some insurance companies will need an authorization prior to the service being performed.  ? ?For non-scheduling  related questions, please contact the cardiac imaging nurse navigator should you have any questions/concerns: ?Marchia Bond, Cardiac Imaging Nurse Navigator ?Gordy Clement, Cardiac Imaging Nurse Navigator ?[facility] ?Direct Office Dial: [phone]  ? ?For scheduling needs, including cancellations and rescheduling, please call [place],  [phone]. ?

## 2022-01-30 NOTE — Progress Notes (Signed)
? ?Cardiology Office Note ? ? ?Date:  01/30/2022  ? ?ID:  [name], DOB 09/06/1971, MRN [id number] ? ?PCP:  Susy Frizzle, MD  ?Cardiologist:   Mertie Moores, MD  ? ?Chief Complaint  ?Patient presents with  ? Hypertension  ?   ?  ? Congestive Heart Failure  ?   ?  ? Chest Pain  ? ?Problem List ?1.  Diastolic dysfunction ?2. Hypertension ?3. Obesity ?4. Palpitations  ?5. Obstructive sleep apnea - has not been using her CPAP, ?Needs to see a pulmonologist to get another mask.  ? ? ?[name] is a 51 y.o. female who presents for  Further evaluation of her shortness breath and palpitations. ?Has had palpitations for several months.   Seem to be worse at night , while watching TV.  ?Also has dyspnea - primarily at rest.   Not related to lying down. ?Has DOE ?Works out at [employer] , has been going for the past month. Has had shortness of breath since getting back to the gym.   ? ?Was given lasix 20 as needed - takes it once every 1-2 months.   Seems to need to take the lasix after she has eaten more salty foods than she should .    ?She knows that she should limit salt intake but has not made any real effort.   ?Has lost 4 lbs since Jan. 1. ? ?They eat out quite a bit ? 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    Husband is a Hotel manager.   ?2 boys at home,  Age 33 and 94.  ? ?Sept. 7, 2017: ?[name] is seen for a follow up visit ?This past Saturday ,  ?Has had some hot flashes / sweats  ?HR has been very high for the past several weeks.  ?(is on adderall)  ?No diarrhea. ?No weight loss.  ? ?Has not been using her CPAP mask - ends up taking the CPAP mask off after 15 minutes.  ? ?Oct. 9, 2017: ? ?[name] is seen back for follow up of Her chronic diastolic congestive heart failure. ?She was seen in Sept.  ?We stopped the Bystolic and started Metoprolol 50 BID  ?Has been checking BP , ?Now works at [employer].  ? ?Mar 17, 2018: ?[name] is seen back today for further evaluation of her  sinus tachycardia and diastolic heart failure. ?Had partial left knee replacement [name] )  ?Occasional episodes of shortness of breath.   ?Is walking at lunch - gets a little short of breath  ?Is making progress with her walking  ? ?Wt today is 228 lbs.  ? ?December 01, 2019:  ?[name] is seen back today for follow-up of her palpitations and chronic diastolic congestive heart failure. ?Wt. Today is 203 lbs ( down 25 lbs from last visit)  ?Rare CP.   ?Some palpitations  ?Works at [employer] ( now owned by [employer])  ?Walking regularly ,   Working on diet .   ?Is having a new CPAP mask made.   ? ?February 10, 2021: ? ?[name] is seen today for follow p of her palpitations and chronic diastolic CHF ?Has OSA ?Had covid in jan. ?Wt today is 211  Up 8 lbs from last yer  ?Is  exercising  ?Event monitor showed :   Sinus rhythm with occasional PVCs.  ?Diastolic BP is elevated.   ?Sees Dr. Leafy Ro at [facility] loss program  ? ? ?May 07, 2021: ?[name] is seen today for a follow-up visit.  Approximately 1 week ago her husband found her passed out on the commode.  He tried to shake her and arouse her but she was unresponsive.  They called EMS and took her to the hospital.  She was found to have profound hypokalemia with a potassium of 2.7. ?She was given IV fluids, oral potassium, IV potassium.  Her white blood cell count was elevated at 12.5. ? ?She denies any dysuria or urgency.  She denies any blood in her stool or abdominal pain, cramps.  She denies any sore throat. ?No COVID symptoms. ? ?Wt is 208 lbs.  ? ?HR has been elevated since her visit to the hospital  ?No TSH recently  ?Taking lasix 20 mg PRN ( usually this is twice a week)  ? ? ?Previous work-up includes a right left heart catheterization in October, 2019.  She had normal coronary arteries.  She had normal LVEF and normal cardiac output by Fick calculations. ? ?Tries to avoid salt - has done better  ? ?Echo from December, 2016 shows normal left  ventricular systolic function.  She has grade 1 diastolic dysfunction. ? ? ?Feb. 15, 2023 ?[name] is seen for follow up of her CHF, dyspnea  ?She has grade 1 DD ?We changed her lasix to spiro at her last visit ?Notices she does not urinate as much ?And feels weak after taking lasix and diuresing  ?Wt is 215. ( Up 7 lbs from last visit ) ? ?January 30, 2022: [name] is here for further discussion of atypical episode of chest pain that she had recently.  During her last visit we added Entresto. ?Her weight is 212 pounds which is down 3 pounds from last visit. ? ?She was seen in the emergency room on March 10 with episode of 2 days of chest pain. ?She was prescribed Wegovy but has not started it yet.  ? ?Chest pain s come and go,  last several minutes.  ?Off and on for days at a time  ?Mid sternal , pressure , no radiation  ?Had some associated dyspnea  ?No diaphoresis  ? ?Has a strong family hx of premature cad  ? ?Labs from Dec. 2022 ?Chol = 188 ?HDL = 40 ?Trigs = 124 ?LDL = 125 ? ? ?Will start ASA 81 mg a day . ?NTG 0.4 mg SL PRN ? ? ?Pain is not related to  eating or drinking  ? ? ? ?Past Medical History:  ?Diagnosis Date  ? Anxiety   ? Asthma   ? Back pain   ? Chest pain   ? Chronic CHF ([name])   ? Dyspnea   ? Echocardiogram 10/25/2015  ? EF 60-65, normal wall motion, grade 1 diastolic dysfunction, GLS -19.1%  ? Edema of left lower extremity   ? Fracture dislocation of elbow joint 01/15/2013  ? right  ? GERD (gastroesophageal reflux disease)   ? Omeprazole as needed, none in 6 mos.  ? Hyperlipidemia   ? Hypertension   ? Joint pain   ? Obesity   ? Palpitations   ? Prediabetes   ? R/L Cardiac catheterization 08/18/2018  ? Normal coronary arteries, mean RA 6, mean PA 17, PCWP 10  ? Seborrheic dermatitis   ? Sleep apnea   ? uses CPAP "most nights"  ? ? ?Past Surgical History:  ?Procedure Laterality Date  ? [place]; 10/10/2000  ? x 2  ? KNEE ARTHROSCOPY Left 1991  ? also  in 2019  ? LAPAROSCOPIC ASSISTED  VAGINAL HYSTERECTOMY  11/02/2006  ? LAPAROSCOPIC LYSIS OF ADHESIONS  03/06/2005  ? RADIAL HEAD ARTHROPLASTY Right 01/17/2013  ? Procedure: RIGHT RADIAL HEAD ARTHROPLASTY OPEN REDUCTION INTERNAL FIXATION CORONOID ;  Surgeon: Tennis Must, MD;  Location: [place];  Service: Orthopedics;  Laterality: Right;  ? RIGHT/LEFT HEART CATH AND CORONARY ANGIOGRAPHY N/A 08/18/2018  ? Procedure: RIGHT/LEFT HEART CATH AND CORONARY ANGIOGRAPHY;  Surgeon: Nelva Bush, MD;  Location: [place] [facility];  Service: Cardiovascular;  Laterality: N/A;  ? TUBAL LIGATION  03/06/2005  ? ? ? ?Current Outpatient Medications  ?Medication Sig Dispense Refill  ? albuterol (PROVENTIL HFA;VENTOLIN HFA) 108 (90 Base) MCG/ACT inhaler Inhale 2 puffs into the lungs every 6 (six) hours as needed for wheezing. 1 Inhaler 11  ? ALPRAZolam (XANAX) 0.5 MG tablet Take 1 tablet (0.5 mg total) by mouth 3 (three) times daily as needed. 45 tablet 0  ? amphetamine-dextroamphetamine (ADDERALL XR) 25 MG 24 hr capsule Take 1 capsule by mouth every morning. 30 capsule 0  ? aspirin EC 81 MG tablet Take 1 tablet (81 mg total) by mouth daily. Swallow whole.    ? escitalopram (LEXAPRO) 10 MG tablet Take 1 tablet (10 mg total) by mouth daily. 30 tablet 3  ? estradiol (ESTRACE) 0.1 MG/GM vaginal cream Place 1 gram vaginally 3 (three) times a week for 14 days, then taper to 1 gram once weekly. 42.5 g 3  ? estradiol (ESTRACE) 0.1 MG/GM vaginal cream Insert 1 gram vaginally 3 (three) times a week for 14 days. Taper use to 1 gram once per week for maintenance. 42.5 g 3  ? ibuprofen (ADVIL,MOTRIN) 200 MG tablet Take 400 mg by mouth 2 (two) times daily as needed for headache or moderate pain.    ? Insulin Pen Needle (UNIFINE PENTIPS) 32G X 4 MM MISC Use 2 times a day as directed 100 each 0  ? Insulin Pen Needle 32G X 4 MM MISC USE ONCE DAILY TO INJECT SAXENDA. 100 each 0  ? Liraglutide -Weight Management 18 MG/3ML SOPN Inject 3 mg into the skin daily. (Patient  taking differently: Inject 1.8 mg into the skin daily.) 15 mL 0  ? metFORMIN (GLUCOPHAGE) 500 MG tablet Take 1 tablet by mouth in the morning, at noon, and at bedtime. 90 tablet 0  ? metoprolol tartrate (LOPRE

## 2022-02-05 ENCOUNTER — Other Ambulatory Visit (HOSPITAL_COMMUNITY): Payer: Self-pay

## 2022-02-05 MED ORDER — CARESTART COVID-19 HOME TEST VI KIT
PACK | 0 refills | Status: DC
  Filled 2022-02-05: qty 4, 4d supply, fill #0

## 2022-02-06 ENCOUNTER — Other Ambulatory Visit: Payer: Self-pay | Admitting: Family Medicine

## 2022-02-06 ENCOUNTER — Other Ambulatory Visit (HOSPITAL_COMMUNITY): Payer: Self-pay

## 2022-02-06 ENCOUNTER — Other Ambulatory Visit: Payer: Self-pay

## 2022-02-06 MED ORDER — AMPHETAMINE-DEXTROAMPHET ER 25 MG PO CP24
25.0000 mg | ORAL_CAPSULE | Freq: Every morning | ORAL | 0 refills | Status: DC
  Filled 2022-02-06: qty 30, 30d supply, fill #0

## 2022-02-06 NOTE — Telephone Encounter (Signed)
LOV 05/09/21 ?Last refill 01/08/22, #30, 0 refills ? ?Please review, thanks! ? ?

## 2022-02-09 ENCOUNTER — Encounter (INDEPENDENT_AMBULATORY_CARE_PROVIDER_SITE_OTHER): Payer: Self-pay | Admitting: Nurse Practitioner

## 2022-02-09 ENCOUNTER — Other Ambulatory Visit (HOSPITAL_COMMUNITY): Payer: Self-pay

## 2022-02-09 ENCOUNTER — Ambulatory Visit (INDEPENDENT_AMBULATORY_CARE_PROVIDER_SITE_OTHER): Payer: 59 | Admitting: Nurse Practitioner

## 2022-02-09 ENCOUNTER — Other Ambulatory Visit: Payer: Self-pay

## 2022-02-09 VITALS — BP 126/78 | HR 78 | Temp 98.0°F | Ht 63.0 in | Wt 210.0 lb

## 2022-02-09 DIAGNOSIS — R110 Nausea: Secondary | ICD-10-CM

## 2022-02-09 DIAGNOSIS — Z6837 Body mass index (BMI) 37.0-37.9, adult: Secondary | ICD-10-CM

## 2022-02-09 DIAGNOSIS — E559 Vitamin D deficiency, unspecified: Secondary | ICD-10-CM | POA: Diagnosis not present

## 2022-02-09 DIAGNOSIS — I5032 Chronic diastolic (congestive) heart failure: Secondary | ICD-10-CM | POA: Diagnosis not present

## 2022-02-09 DIAGNOSIS — R7303 Prediabetes: Secondary | ICD-10-CM | POA: Diagnosis not present

## 2022-02-09 DIAGNOSIS — E669 Obesity, unspecified: Secondary | ICD-10-CM | POA: Diagnosis not present

## 2022-02-09 DIAGNOSIS — Z9189 Other specified personal risk factors, not elsewhere classified: Secondary | ICD-10-CM

## 2022-02-09 DIAGNOSIS — E785 Hyperlipidemia, unspecified: Secondary | ICD-10-CM

## 2022-02-09 MED ORDER — ONDANSETRON HCL 4 MG PO TABS
4.0000 mg | ORAL_TABLET | Freq: Three times a day (TID) | ORAL | 0 refills | Status: DC | PRN
  Filled 2022-02-09: qty 20, 7d supply, fill #0

## 2022-02-09 MED ORDER — VITAMIN D (ERGOCALCIFEROL) 1.25 MG (50000 UNIT) PO CAPS
50000.0000 [IU] | ORAL_CAPSULE | ORAL | 0 refills | Status: DC
  Filled 2022-02-09: qty 4, 28d supply, fill #0

## 2022-02-09 NOTE — Progress Notes (Signed)
? ? ? ?Chief Complaint:  ? ?OBESITY ?[name] is here to discuss her progress with her obesity treatment plan along with follow-up of her obesity related diagnoses. [name] is on the Category 3 Plan with breakfast options and states she is following her eating plan approximately 80-85% of the time. [name] states she is doing 0 minutes 0 times per week. ? ?Today's visit was #: 64 ?Starting weight: 235 lbs ?Starting date: 11/30/2018 ?Today's weight: 210 lbs ?Today's date: 02/09/2022 ?Total lbs lost to date: 25 lbs ?Total lbs lost since last in-office visit: 5 lbs ? ?Interim History: [name] has done well with weight loss since last visit. She started [facility] 1 mg this past Friday. She reports nausea and vomiting the first 3 days. She is doing better today. She stopped Saxenda 1-2 week prior to starting [facility]. She feels it's too early to tell if the Mancel Parsons is helping with hunger and cravings.  ? ?Subjective:  ? ?1. Pre-diabetes ?[name] is currently taking Metformin 500 mg twice daily. She denies side effects. Her last A1C was 5.6. ? ?2. Vitamin D deficiency ?[name] is taking Vitmain D 50,000 IU weekly. She denies side effects.  ? ?3. Chronic diastolic CHF (congestive heart failure) ([place]) ?[name] saw cardiology last on 01/30/2022 for follow up after going to the emergency room on 01/23/2022 for evaluation of chest pain, shortness of breath and palpitations. She reports a history of "chronic palpitations". Cardiology questioned if her symptoms were due to starting [facility] but she hadn't started taking the wegovy yet.  She has been on [facility] for 3 days.  Denies any complaints today of palpitations.  She does reports a constant pressure in her chest.  She is scheduled for (Coronary CTA  this Friday). She will have a Cath afterwards if needed. She was started on a daily ASA '81mg'$  and was given a prescription for NTG PRN.   ? ?4. Nausea ?[name] notes nausea since starting Wegovy. She took Zofran in the past.  ? ?5.  Hyperlipidemia, unspecified hyperlipidemia type ?[name] is not on a statin currently. She sees cardiology on a regular basis.  ? ?6. At risk for side effect of medication ?[name] is at risk for side effect of medication due to newly start of Wegovy.  ? ?Assessment/Plan:  ? ?1. Pre-diabetes ?[name] will continue Metformin 500 mg twice daily. We discussed side effects. She will continue to work on weight loss, exercise, and decreasing simple carbohydrates to help decrease the risk of diabetes.  ? ?2. Vitamin D deficiency ?Low Vitamin D level contributes to fatigue and are associated with obesity, breast, and colon cancer. We will refill prescription Vitamin D 50,000 IU every week for 1 month with 1 refills and [name] will follow-up for routine testing of Vitamin D, at least 2-3 times per year to avoid over-replacement. ? ?- Vitamin D, Ergocalciferol, (DRISDOL) 1.25 MG (50000 UNIT) CAPS capsule; Take 1 capsule by mouth every 7 days.  Dispense: 4 capsule; Refill: 0 ? ?3. Chronic diastolic CHF (congestive heart failure) ([place]) ?[name] will continue to follow up with cardiology and medications as directed. Disease process and medications reviewed with an emphasis on salt restriction, regular exercise, and regular weight monitoring.   ? ?4. Nausea ?Zofran 4 mg as needed. We discussed side effects.  ? ?- ondansetron (ZOFRAN) 4 MG tablet; Take 1 tablet (4 mg total) by mouth every 8 (eight) hours as needed for nausea or vomiting.  Dispense: 20 tablet; Refill: 0 ? ?5. Hyperlipidemia, unspecified hyperlipidemia type ?Cardiovascular risk  and specific lipid/LDL goals reviewed.  [name] will continue to follow up with cardiology. We discussed several lifestyle modifications today and [name] will continue to work on diet, exercise and weight loss efforts. Orders and follow up as documented in patient record.  ? ?Counseling ?Intensive lifestyle modifications are the first line treatment for this issue. ?Dietary changes:  Increase soluble fiber. Decrease simple carbohydrates. ?Exercise changes: Moderate to vigorous-intensity aerobic activity 150 minutes per week if tolerated. ?Lipid-lowering medications: see documented in medical record. ? ?6. At risk for side effect of medication ?[name] was given approximately 15 minutes of drug side effect counseling today.  We discussed side effect possibility and risk versus benefits. [name] agreed to the medication and will contact this office if these side effects are intolerable. ? ?Repetitive spaced learning was employed today to elicit superior memory formation and behavioral change.  ? ?7. Obesity with current BMI of 37.3 ?[name] is currently in the action stage of change. As such, her goal is to continue with weight loss efforts. She has agreed to the Category 3 Plan.  ? ?Exercise goals:  Per cardiology recommendation.  ? ?Behavioral modification strategies: increasing lean protein intake, increasing water intake, no skipping meals, and meal planning and cooking strategies. ? ?[name] has agreed to follow-up with our clinic in 2 weeks. She was informed of the importance of frequent follow-up visits to maximize her success with intensive lifestyle modifications for her multiple health conditions.  ? ?Objective:  ? ?Blood pressure 126/78, pulse 78, temperature 98 ?F (36.7 ?C), height '5\' 3"'$  (1.6 m), weight 210 lb (95.3 kg), SpO2 98 %. ?Body mass index is 37.2 kg/m?. ? ?General: Cooperative, alert, well developed, in no acute distress. ?HEENT: Conjunctivae and lids unremarkable. ?Cardiovascular: Regular rhythm.  ?Lungs: Normal work of breathing. ?Neurologic: No focal deficits.  ? ?Lab Results  ?Component Value Date  ? CREATININE 0.66 01/23/2022  ? BUN 11 01/23/2022  ? NA 139 01/23/2022  ? K 4.1 01/23/2022  ? CL 102 01/23/2022  ? CO2 29 01/23/2022  ? ?Lab Results  ?Component Value Date  ? ALT 9 10/23/2021  ? AST 14 10/23/2021  ? ALKPHOS 70 10/23/2021  ? BILITOT 0.3 10/23/2021  ? ?Lab  Results  ?Component Value Date  ? HGBA1C 5.6 10/23/2021  ? HGBA1C 5.5 05/08/2021  ? HGBA1C 5.6 05/29/2020  ? HGBA1C 5.4 11/27/2019  ? HGBA1C 5.4 06/27/2019  ? ?Lab Results  ?Component Value Date  ? INSULIN 9.3 10/23/2021  ? INSULIN 22.8 05/08/2021  ? INSULIN 15.2 05/29/2020  ? INSULIN 10.3 11/27/2019  ? INSULIN 12.5 06/27/2019  ? ?Lab Results  ?Component Value Date  ? TSH 3.820 05/07/2021  ? ?Lab Results  ?Component Value Date  ? CHOL 188 10/23/2021  ? HDL 40 10/23/2021  ? LDLCALC 126 (H) 10/23/2021  ? TRIG 124 10/23/2021  ? CHOLHDL 4.4 11/30/2018  ? ?Lab Results  ?Component Value Date  ? VD25OH 39.1 10/23/2021  ? VD25OH 39.3 05/08/2021  ? VD25OH 37.5 05/29/2020  ? ?Lab Results  ?Component Value Date  ? WBC 11.8 (H) 01/23/2022  ? HGB 14.6 01/23/2022  ? HCT 44.7 01/23/2022  ? MCV 87.5 01/23/2022  ? PLT 402 (H) 01/23/2022  ? ?No results found for: IRON, TIBC, FERRITIN ? ?Attestation Statements:  ? ?Reviewed by clinician on day of visit: allergies, medications, problem list, medical history, surgical history, family history, social history, and previous encounter notes. ? ?I, Lizbeth Bark, RMA, am acting as Location manager for Everardo Pacific, FNP. ? ?I  have reviewed the above documentation for accuracy and completeness, and I agree with the above. Everardo Pacific, FNP  ?

## 2022-02-11 ENCOUNTER — Telehealth (HOSPITAL_COMMUNITY): Payer: Self-pay | Admitting: *Deleted

## 2022-02-11 ENCOUNTER — Encounter (HOSPITAL_COMMUNITY): Payer: Self-pay

## 2022-02-11 NOTE — Telephone Encounter (Signed)
Reaching out to patient to offer assistance regarding upcoming cardiac imaging study; pt verbalizes understanding of appt date/time, parking situation and where to check in, pre-test NPO status and medications ordered, and verified current allergies; name and call back number provided for further questions should they arise ? ?Gordy Clement RN Navigator Cardiac Imaging ?[facility] ?[phone] office ?[phone] cell ? ?Patient to take '100mg'$  metoprolol tartrate two hours prior to her cardiac CT.  She is aware to arrive at 8:30am for her 9am scan. ?

## 2022-02-13 ENCOUNTER — Ambulatory Visit (HOSPITAL_COMMUNITY)
Admission: RE | Admit: 2022-02-13 | Discharge: 2022-02-13 | Disposition: A | Discharge status: Home / Self Care | Payer: 59 | Source: Ambulatory Visit | Attending: Cardiovascular Disease | Admitting: Cardiovascular Disease

## 2022-02-13 DIAGNOSIS — R079 Chest pain, unspecified: Secondary | ICD-10-CM | POA: Diagnosis not present

## 2022-02-13 DIAGNOSIS — I2510 Atherosclerotic heart disease of native coronary artery without angina pectoris: Secondary | ICD-10-CM | POA: Diagnosis not present

## 2022-02-13 MED ORDER — IOHEXOL 350 MG/ML SOLN
95.0000 mL | Freq: Once | INTRAVENOUS | Status: AC | PRN
  Administered 2022-02-13: 95 mL via INTRAVENOUS

## 2022-02-13 MED ORDER — NITROGLYCERIN 0.4 MG SL SUBL
0.8000 mg | SUBLINGUAL_TABLET | Freq: Once | SUBLINGUAL | Status: AC
  Administered 2022-02-13: 0.8 mg via SUBLINGUAL

## 2022-02-13 MED ORDER — METOPROLOL TARTRATE 5 MG/5ML IV SOLN
INTRAVENOUS | Status: AC
  Filled 2022-02-13: qty 20

## 2022-02-13 MED ORDER — NITROGLYCERIN 0.4 MG SL SUBL
SUBLINGUAL_TABLET | SUBLINGUAL | Status: AC
Start: 2022-02-13 — End: 2022-02-13
  Filled 2022-02-13: qty 2

## 2022-02-13 MED ORDER — METOPROLOL TARTRATE 5 MG/5ML IV SOLN
10.0000 mg | INTRAVENOUS | Status: DC | PRN
  Administered 2022-02-13: 10 mg via INTRAVENOUS

## 2022-02-17 ENCOUNTER — Telehealth: Payer: Self-pay

## 2022-02-17 ENCOUNTER — Other Ambulatory Visit (HOSPITAL_COMMUNITY): Payer: Self-pay

## 2022-02-17 MED ORDER — ROSUVASTATIN CALCIUM 20 MG PO TABS
20.0000 mg | ORAL_TABLET | Freq: Every day | ORAL | 3 refills | Status: DC
  Filled 2022-02-17: qty 90, 90d supply, fill #0
  Filled 2022-06-15: qty 90, 90d supply, fill #1
  Filled 2022-12-16: qty 90, 90d supply, fill #2

## 2022-02-17 NOTE — Telephone Encounter (Signed)
-----   Message from Thayer Headings, MD sent at 02/16/2022  5:40 PM EDT ----- ?Coronary calcium score is 71.7, which places the patient in the 98th ?percentile for age and sex matched control ?RCA - minimal CAD ?LM  - minimal plaque ?LAD - mild plaque ?Lcx - no plaque  ? ?Her last LDL is 126 .  She does not need any stenting or other coronary procedures.  Our main objective now  Risk reduction through lowering of her cholesterol. ?Lets start rosuvastatin 20 mg a day.  Check lipids, ALT, basic metabolic profile in 3 months ? ? ? ? ?

## 2022-02-17 NOTE — Telephone Encounter (Signed)
Outreach made to Pt. ? ?Pt had reviewed her test results on MyChart. ? ?In agreement with starting Crestor. ? ?Prescription sent to pharmacy.  Will keep 6/23 f/u appt and plan to repeat lab work at this appt. ? ?

## 2022-02-25 ENCOUNTER — Encounter (INDEPENDENT_AMBULATORY_CARE_PROVIDER_SITE_OTHER): Payer: Self-pay | Admitting: Family Medicine

## 2022-02-25 ENCOUNTER — Ambulatory Visit (INDEPENDENT_AMBULATORY_CARE_PROVIDER_SITE_OTHER): Payer: 59 | Admitting: Family Medicine

## 2022-02-25 ENCOUNTER — Other Ambulatory Visit (HOSPITAL_COMMUNITY): Payer: Self-pay

## 2022-02-25 VITALS — BP 115/75 | HR 87 | Temp 97.5°F | Ht 63.0 in | Wt 208.0 lb

## 2022-02-25 DIAGNOSIS — Z6836 Body mass index (BMI) 36.0-36.9, adult: Secondary | ICD-10-CM

## 2022-02-25 DIAGNOSIS — E669 Obesity, unspecified: Secondary | ICD-10-CM

## 2022-02-25 DIAGNOSIS — E559 Vitamin D deficiency, unspecified: Secondary | ICD-10-CM | POA: Diagnosis not present

## 2022-02-26 ENCOUNTER — Other Ambulatory Visit (HOSPITAL_COMMUNITY): Payer: Self-pay

## 2022-02-26 MED ORDER — VITAMIN D (ERGOCALCIFEROL) 1.25 MG (50000 UNIT) PO CAPS
50000.0000 [IU] | ORAL_CAPSULE | ORAL | 0 refills | Status: DC
  Filled 2022-02-26: qty 12, 84d supply, fill #0

## 2022-02-26 MED ORDER — WEGOVY 1 MG/0.5ML ~~LOC~~ SOAJ
1.0000 mg | SUBCUTANEOUS | 0 refills | Status: DC
Start: 2022-02-26 — End: 2022-04-27
  Filled 2022-02-26: qty 2, 28d supply, fill #0
  Filled 2022-04-02: qty 2, 28d supply, fill #1

## 2022-02-27 ENCOUNTER — Other Ambulatory Visit (HOSPITAL_COMMUNITY): Payer: Self-pay

## 2022-02-27 NOTE — Progress Notes (Signed)
? ? ? ?Chief Complaint:  ? ?OBESITY ?[name] is here to discuss her progress with her obesity treatment plan along with follow-up of her obesity related diagnoses. [name] is on the Category 3 Plan and states she is following her eating plan approximately 80% of the time. [name] states she is walking for 20-25 minutes 2-3 times per week. ? ?Today's visit was #: 35 ?Starting weight: 235 lbs ?Starting date: 11/30/2018 ?Today's weight: 208 lbs ?Today's date: 02/25/2022 ?Total lbs lost to date: 31 ?Total lbs lost since last in-office visit: 2 ? ?Interim History: [name] is doing  well on Wegovy. Her hunger is controlled and she is doing well with portion control. She had some initial nausea in the beginning, but not since.  ? ?Subjective:  ? ?1. Vitamin D deficiency ?[name] is stable on Vitamin D, with no side effects noted.  ? ?Assessment/Plan:  ? ?1. Vitamin D deficiency ?We will refill prescription Vitamin D for 90 days with no refills. [name] will follow-up for routine testing of Vitamin D, at least 2-3 times per year to avoid over-replacement. ? ?- Vitamin D, Ergocalciferol, (DRISDOL) 1.25 MG (50000 UNIT) CAPS capsule; Take 1 capsule by mouth every 7 days.  Dispense: 12 capsule; Refill: 0 ? ?2. Obesity with current BMI of 36.8 ?[name] is currently in the action stage of change. As such, her goal is to continue with weight loss efforts. She has agreed to the Category 3 Plan.  ? ?We discussed various medication options to help [name] with her weight loss efforts and we both agreed to continue Wegovy, and we will refill for 90 days. ? ?- Semaglutide-Weight Management (WEGOVY) 1 MG/0.5ML SOAJ; Inject 1 mg into the skin once a week.  Dispense: 6 mL; Refill: 0 ? ?Exercise goals: As is. ? ?Behavioral modification strategies: increasing lean protein intake. ? ?[name] has agreed to follow-up with our clinic in 8 weeks. She was informed of the importance of frequent follow-up visits to maximize her success with  intensive lifestyle modifications for her multiple health conditions.  ? ?Objective:  ? ?Blood pressure 115/75, pulse 87, temperature (!) 97.5 ?F (36.4 ?C), height '5\' 3"'$  (1.6 m), weight 208 lb (94.3 kg), SpO2 98 %. ?Body mass index is 36.85 kg/m?. ? ?General: Cooperative, alert, well developed, in no acute distress. ?HEENT: [name] unremarkable. ?Cardiovascular: Regular rhythm.  ?Lungs: Normal work of breathing. ?Neurologic: No focal deficits.  ? ?Lab Results  ?Component Value Date  ? CREATININE 0.66 01/23/2022  ? BUN 11 01/23/2022  ? NA 139 01/23/2022  ? K 4.1 01/23/2022  ? CL 102 01/23/2022  ? CO2 29 01/23/2022  ? ?Lab Results  ?Component Value Date  ? ALT 9 10/23/2021  ? AST 14 10/23/2021  ? ALKPHOS 70 10/23/2021  ? BILITOT 0.3 10/23/2021  ? ?Lab Results  ?Component Value Date  ? HGBA1C 5.6 10/23/2021  ? HGBA1C 5.5 05/08/2021  ? HGBA1C 5.6 05/29/2020  ? HGBA1C 5.4 11/27/2019  ? HGBA1C 5.4 06/27/2019  ? ?Lab Results  ?Component Value Date  ? INSULIN 9.3 10/23/2021  ? INSULIN 22.8 05/08/2021  ? INSULIN 15.2 05/29/2020  ? INSULIN 10.3 11/27/2019  ? INSULIN 12.5 06/27/2019  ? ?Lab Results  ?Component Value Date  ? TSH 3.820 05/07/2021  ? ?Lab Results  ?Component Value Date  ? CHOL 188 10/23/2021  ? HDL 40 10/23/2021  ? LDLCALC 126 (H) 10/23/2021  ? TRIG 124 10/23/2021  ? CHOLHDL 4.4 11/30/2018  ? ?Lab Results  ?Component Value Date  ?  VD25OH 39.1 10/23/2021  ? VD25OH 39.3 05/08/2021  ? VD25OH 37.5 05/29/2020  ? ?Lab Results  ?Component Value Date  ? WBC 11.8 (H) 01/23/2022  ? HGB 14.6 01/23/2022  ? HCT 44.7 01/23/2022  ? MCV 87.5 01/23/2022  ? PLT 402 (H) 01/23/2022  ? ?No results found for: IRON, TIBC, FERRITIN ? ?Attestation Statements:  ? ?Reviewed by clinician on day of visit: allergies, medications, problem list, medical history, surgical history, family history, social history, and previous encounter notes. ? ?Time spent on visit including pre-visit chart review and post-visit care and charting  was 30 minutes.  ? ? ?I, Trixie Dredge, am acting as transcriptionist for Dennard Nip, MD. ? ?I have reviewed the above documentation for accuracy and completeness, and I agree with the above. -  Dennard Nip, MD ? ? ?

## 2022-03-04 ENCOUNTER — Other Ambulatory Visit (HOSPITAL_COMMUNITY): Payer: Self-pay

## 2022-03-17 DIAGNOSIS — Z6837 Body mass index (BMI) 37.0-37.9, adult: Secondary | ICD-10-CM | POA: Diagnosis not present

## 2022-03-17 DIAGNOSIS — Z01419 Encounter for gynecological examination (general) (routine) without abnormal findings: Secondary | ICD-10-CM | POA: Diagnosis not present

## 2022-03-17 DIAGNOSIS — N941 Unspecified dyspareunia: Secondary | ICD-10-CM | POA: Diagnosis not present

## 2022-03-17 DIAGNOSIS — N959 Unspecified menopausal and perimenopausal disorder: Secondary | ICD-10-CM | POA: Diagnosis not present

## 2022-03-17 DIAGNOSIS — Z1231 Encounter for screening mammogram for malignant neoplasm of breast: Secondary | ICD-10-CM | POA: Diagnosis not present

## 2022-03-17 DIAGNOSIS — N951 Menopausal and female climacteric states: Secondary | ICD-10-CM | POA: Diagnosis not present

## 2022-03-17 DIAGNOSIS — Z1272 Encounter for screening for malignant neoplasm of vagina: Secondary | ICD-10-CM | POA: Diagnosis not present

## 2022-03-19 ENCOUNTER — Other Ambulatory Visit (HOSPITAL_COMMUNITY): Payer: Self-pay

## 2022-03-19 ENCOUNTER — Other Ambulatory Visit: Payer: Self-pay | Admitting: Family Medicine

## 2022-03-19 NOTE — Telephone Encounter (Signed)
Requested medication (s) are due for refill today: Yes ? ?Requested medication (s) are on the active medication list: Yes ? ?Last refill:  Alprazolam 09/23/21     Adderall  02/06/22 ? ?Future visit scheduled: No ? ?Notes to clinic:  Not delegated. ? ? ? ?Requested Prescriptions  ?Pending Prescriptions Disp Refills  ? ALPRAZolam (XANAX) 0.5 MG tablet 45 tablet 0  ?  Sig: Take 1 tablet (0.5 mg total) by mouth 3 (three) times daily as needed.  ?  ? Not Delegated - Psychiatry: Anxiolytics/Hypnotics 2 Failed - 03/19/2022  7:19 AM  ?  ?  Failed - This refill cannot be delegated  ?  ?  Failed - Urine Drug Screen completed in last 360 days  ?  ?  Failed - Valid encounter within last 6 months  ?  Recent Outpatient Visits   ? ?      ? 10 months ago Vasovagal attack  ? [facility] [place], Cammie Mcgee, MD  ? 1 year ago Dyspnea, unspecified type  ? [facility] [place], Cammie Mcgee, MD  ? 2 years ago Hearing loss associated with syndrome, bilateral  ? [facility] [place], Cammie Mcgee, MD  ? 3 years ago Prediabetes  ? [facility] Pickard, Cammie Mcgee, MD  ? 4 years ago Encounter for preventive health examination  ? [place], [place], PA-C  ? ?  ?  ?Future Appointments   ? ?        ? In 1 month Nahser, Wonda Cheng, MD [place], [facility]  ? ?  ? ? ?  ?  ?  Passed - Patient is not pregnant  ?  ?  ? amphetamine-dextroamphetamine (ADDERALL XR) 25 MG 24 hr capsule 30 capsule 0  ?  Sig: Take 1 capsule by mouth every morning.  ?  ? Not Delegated - Psychiatry:  Stimulants/ADHD Failed - 03/19/2022  7:19 AM  ?  ?  Failed - This refill cannot be delegated  ?  ?  Failed - Urine Drug Screen completed in last 360 days  ?  ?  Failed - Valid encounter within last 6 months  ?  Recent Outpatient Visits   ? ?      ? 10 months ago Vasovagal attack  ? [facility] [place], Cammie Mcgee, MD  ? 1 year ago Dyspnea, unspecified type  ? [facility] Pickard, Cammie Mcgee, MD  ? 2 years ago Hearing loss associated with syndrome, bilateral  ? [facility] Pickard, Cammie Mcgee, MD  ? 3 years ago Prediabetes  ? [facility] [place]ard, Cammie Mcgee, MD  ? 4 years ago Encounter for preventive health examination  ? [place], [place], PA-C  ? ?  ?  ?Future Appointments   ? ?        ? In 1 month Nahser, Wonda Cheng, MD [facility], [facility]  ? ?  ? ? ?  ?  ?  Passed - Last BP in normal range  ?  BP Readings from Last 1 Encounters:  ?02/25/22 115/75  ?  ?  ?  ?  Passed - Last Heart Rate in normal range  ?  Pulse Readings from Last 1 Encounters:  ?02/25/22 87  ?  ?  ?  ?  ? ?

## 2022-03-19 NOTE — Telephone Encounter (Signed)
[place] ? ?LOV 05/09/21 ?Last refill 02/06/22, #30, 0 refills ? ? ?Xanax  ?LOV 05/09/21 ?Last refill 09/23/21, #45, 0 refills ? ?Please review, thanks! ? ?Please review, thanks! ? ?

## 2022-03-20 ENCOUNTER — Other Ambulatory Visit (HOSPITAL_COMMUNITY): Payer: Self-pay

## 2022-03-20 ENCOUNTER — Ambulatory Visit: Payer: 59 | Admitting: Cardiovascular Disease

## 2022-03-20 MED ORDER — AMPHETAMINE-DEXTROAMPHET ER 25 MG PO CP24
25.0000 mg | ORAL_CAPSULE | Freq: Every morning | ORAL | 0 refills | Status: DC
  Filled 2022-03-20: qty 30, 30d supply, fill #0

## 2022-03-20 MED ORDER — ALPRAZOLAM 0.5 MG PO TABS
0.5000 mg | ORAL_TABLET | Freq: Three times a day (TID) | ORAL | 0 refills | Status: DC | PRN
  Filled 2022-03-20: qty 45, 15d supply, fill #0

## 2022-03-23 ENCOUNTER — Ambulatory Visit (INDEPENDENT_AMBULATORY_CARE_PROVIDER_SITE_OTHER): Payer: 59 | Admitting: Physician Assistant

## 2022-03-23 ENCOUNTER — Ambulatory Visit (INDEPENDENT_AMBULATORY_CARE_PROVIDER_SITE_OTHER): Payer: 59

## 2022-03-23 DIAGNOSIS — M545 Low back pain, unspecified: Secondary | ICD-10-CM | POA: Diagnosis not present

## 2022-03-23 NOTE — Progress Notes (Signed)
? ?Office Visit Note ?  ?Patient: [name]           ?Date of Birth: 1970-12-23           ?MRN: [id number] ?Visit Date: 03/23/2022 ?             ?Requested by: Susy Frizzle, MD ?[address] [place],  [place] [address] ?PCP: Susy Frizzle, MD ? ?Chief Complaint  ?Patient presents with  ? Lower Back - Pain  ? ? ? ? ?HPI: ?Patient is a pleasant 52 year old woman who is 3 weeks status post slipping down the steps when it was raining.  She had some bruising at the time of her posterior right side of her back and into her buttock.  She has been taking some aspirin powders and anti-inflammatories.  She still is having some pain.  Denies any radiation down into her legs or radicular findings. ? ?Assessment & Plan: ?Visit Diagnoses:  ?1. Acute low back pain, unspecified back pain laterality, unspecified whether sciatica present   ? ? ?Plan: Low back pain after fall.  I think this is mostly muscular in nature.  I would like her to go on a regular course of anti-inflammatories ibuprofen 400 mg 3 times a day.  She is taken ibuprofen in the past without difficulty we will follow-up if no improvement ? ?Follow-Up Instructions: No follow-ups on file.  ? ?Ortho Exam ? ?Patient is alert, oriented, no adenopathy, well-dressed, normal affect, normal respiratory effort. ?Examination of her back cannot appreciate any ecchymosis or bruising today mild tenderness over the posterior lumbar spine.  No radicular findings her strength is 5 out of 5 with dorsiflexion plantarflexion of her ankles flexion extension of her knees and flexion of her hips she is able to wiggle her toes she has good feeling ? ?Imaging: ?XR Lumbar Spine 2-3 Views ? ?Result Date: 03/23/2022 ?X-rays of her lumbar spine were reviewed today no acute fractures noted she overall has well-maintained preserved joint spaces.  No significant degenerative changes  ?No images are attached to the encounter. ? ?Labs: ?Lab Results  ?Component Value Date  ?  HGBA1C 5.6 10/23/2021  ? HGBA1C 5.5 05/08/2021  ? HGBA1C 5.6 05/29/2020  ? ? ? ?Lab Results  ?Component Value Date  ? ALBUMIN 4.1 10/23/2021  ? ALBUMIN 4.3 05/29/2020  ? ALBUMIN 4.5 11/27/2019  ? ? ?No results found for: MG ?Lab Results  ?Component Value Date  ? VD25OH 39.1 10/23/2021  ? VD25OH 39.3 05/08/2021  ? VD25OH 37.5 05/29/2020  ? ? ?No results found for: PREALBUMIN ? ?  Latest Ref Rng & Units 01/23/2022  ?  7:31 PM 05/07/2021  ?  3:15 PM 05/01/2021  ?  4:38 AM  ?CBC EXTENDED  ?WBC 4.0 - 10.5 K/uL 11.8   11.3   12.5    ?RBC 3.87 - 5.11 MIL/uL 5.11   5.29   4.79    ?Hemoglobin 12.0 - 15.0 g/dL 14.6   15.3   13.7    ?HCT 36.0 - 46.0 % 44.7   45.6   42.3    ?Platelets 150 - 400 K/uL 402   409   263    ?NEUT# 1.7 - 7.7 K/uL   8.5    ?Lymph# 0.7 - 4.0 K/uL   3.1    ? ? ? ?There is no height or weight on file to calculate BMI. ? ?Orders:  ?Orders Placed This Encounter  ?Procedures  ? XR Lumbar Spine 2-3  Views  ? ?No orders of the defined types were placed in this encounter. ? ? ? Procedures: ?No procedures performed ? ?Clinical Data: ?No additional findings. ? ?ROS: ? ?All other systems negative, except as noted in the HPI. ?Review of Systems ? ?Objective: ?Vital Signs: There were no vitals taken for this visit. ? ?Specialty Comments:  ?No specialty comments available. ? ?PMFS History: ?Patient Active Problem List  ? Diagnosis Date Noted  ? Chest tightness 01/30/2022  ? Palpitations 10/03/2020  ? At risk for diabetes mellitus 08/12/2020  ? Asthma 05/21/2020  ? H/O: hysterectomy 05/21/2020  ? Prediabetes 12/29/2018  ? Essential hypertension 09/02/2018  ? Atypical chest pain 08/18/2018  ? Shortness of breath 08/18/2018  ? ADD (attention deficit disorder) 12/29/2017  ? Chronic diastolic CHF (congestive heart failure) ([place]) 08/24/2016  ? Sinus tachycardia ([place]) 07/23/2016  ? Diastolic dysfunction 40/08/2724  ? PVC (premature ventricular contraction) 12/17/2015  ? Family history of premature coronary artery disease  03/14/2015  ? GERD (gastroesophageal reflux disease) 05/02/2014  ? Anal fissure 05/02/2014  ? Leg swelling 05/07/2013  ? Obesity, unspecified 05/07/2013  ? Spasm of muscle 12/05/2010  ? Pain in thoracic spine 12/05/2010  ? Acute upper respiratory infection 12/05/2010  ? Vitamin D deficiency 08/20/2010  ? Other abnormal glucose 08/20/2010  ? Hypothyroidism 08/20/2010  ? Depressive disorder, not elsewhere classified 07/31/2010  ? Anxiety state 07/31/2010  ? Seborrheic dermatitis, unspecified 09/18/2008  ? ?Past Medical History:  ?Diagnosis Date  ? Anxiety   ? Asthma   ? Back pain   ? Chest pain   ? Chronic CHF ([place])   ? Dyspnea   ? Echocardiogram 10/25/2015  ? EF 60-65, normal wall motion, grade 1 diastolic dysfunction, GLS -19.1%  ? Edema of left lower extremity   ? Fracture dislocation of elbow joint 01/15/2013  ? right  ? GERD (gastroesophageal reflux disease)   ? Omeprazole as needed, none in 6 mos.  ? Hyperlipidemia   ? Hypertension   ? Joint pain   ? Obesity   ? Palpitations   ? Prediabetes   ? R/L Cardiac catheterization 08/18/2018  ? Normal coronary arteries, mean RA 6, mean PA 17, PCWP 10  ? Seborrheic dermatitis   ? Sleep apnea   ? uses CPAP "most nights"  ?  ?Family History  ?Problem Relation Age of Onset  ? Diabetes Sister   ? Heart disease Father 31  ?     MI--1st Dx CAD. Died2that time  ? Sudden death Father   ? Diabetes Mother   ? Hypertension Mother   ? Heart disease Mother   ? Obesity Mother   ? Heart disease Maternal Uncle   ? Ovarian cancer Maternal Grandmother   ? Diabetes Maternal Grandmother   ? Heart disease Maternal Grandmother   ?     Great Grandmother  ? Diabetes Paternal Grandmother   ? Liver disease Paternal Aunt   ? Heart disease Maternal Grandfather   ?  ?Past Surgical History:  ?Procedure Laterality Date  ? [place]; 10/10/2000  ? x 2  ? KNEE ARTHROSCOPY Left 1991  ? also in 2019  ? LAPAROSCOPIC ASSISTED VAGINAL HYSTERECTOMY  11/02/2006  ? LAPAROSCOPIC LYSIS OF ADHESIONS   03/06/2005  ? RADIAL HEAD ARTHROPLASTY Right 01/17/2013  ? Procedure: RIGHT RADIAL HEAD ARTHROPLASTY OPEN REDUCTION INTERNAL FIXATION CORONOID ;  Surgeon: Tennis Must, MD;  Location: [facility];  Service: Orthopedics;  Laterality: Right;  ? RIGHT/LEFT HEART CATH  AND CORONARY ANGIOGRAPHY N/A 08/18/2018  ? Procedure: RIGHT/LEFT HEART CATH AND CORONARY ANGIOGRAPHY;  Surgeon: Nelva Bush, MD;  Location: [facility];  Service: Cardiovascular;  Laterality: N/A;  ? TUBAL LIGATION  03/06/2005  ? ?Social History  ? ?Occupational History  ? Occupation: CMA  ?  Employer:   ?Tobacco Use  ? Smoking status: Never  ?  Passive exposure: Past  ? Smokeless tobacco: Never  ?Substance and Sexual Activity  ? Alcohol use: No  ? Drug use: No  ? Sexual activity: Not on file  ? ? ? ? ? ?

## 2022-04-01 DIAGNOSIS — R87612 Low grade squamous intraepithelial lesion on cytologic smear of cervix (LGSIL): Secondary | ICD-10-CM | POA: Diagnosis not present

## 2022-04-01 DIAGNOSIS — N870 Mild cervical dysplasia: Secondary | ICD-10-CM | POA: Diagnosis not present

## 2022-04-02 ENCOUNTER — Other Ambulatory Visit (HOSPITAL_COMMUNITY): Payer: Self-pay

## 2022-04-03 ENCOUNTER — Other Ambulatory Visit (HOSPITAL_COMMUNITY): Payer: Self-pay

## 2022-04-21 ENCOUNTER — Other Ambulatory Visit (HOSPITAL_COMMUNITY): Payer: Self-pay

## 2022-04-22 ENCOUNTER — Other Ambulatory Visit (HOSPITAL_COMMUNITY): Payer: Self-pay

## 2022-04-27 ENCOUNTER — Encounter: Payer: Self-pay | Admitting: Cardiovascular Disease

## 2022-04-27 ENCOUNTER — Encounter (INDEPENDENT_AMBULATORY_CARE_PROVIDER_SITE_OTHER): Payer: Self-pay | Admitting: Family Medicine

## 2022-04-27 ENCOUNTER — Other Ambulatory Visit (HOSPITAL_COMMUNITY): Payer: Self-pay

## 2022-04-27 ENCOUNTER — Ambulatory Visit (INDEPENDENT_AMBULATORY_CARE_PROVIDER_SITE_OTHER): Payer: 59 | Admitting: Family Medicine

## 2022-04-27 VITALS — BP 103/70 | HR 84 | Temp 98.2°F | Ht 63.0 in | Wt 205.0 lb

## 2022-04-27 DIAGNOSIS — E559 Vitamin D deficiency, unspecified: Secondary | ICD-10-CM | POA: Diagnosis not present

## 2022-04-27 DIAGNOSIS — E669 Obesity, unspecified: Secondary | ICD-10-CM

## 2022-04-27 DIAGNOSIS — Z6836 Body mass index (BMI) 36.0-36.9, adult: Secondary | ICD-10-CM

## 2022-04-27 DIAGNOSIS — Z7985 Long-term (current) use of injectable non-insulin antidiabetic drugs: Secondary | ICD-10-CM | POA: Diagnosis not present

## 2022-04-27 DIAGNOSIS — R7303 Prediabetes: Secondary | ICD-10-CM | POA: Diagnosis not present

## 2022-04-27 DIAGNOSIS — Z9189 Other specified personal risk factors, not elsewhere classified: Secondary | ICD-10-CM

## 2022-04-27 DIAGNOSIS — Z7984 Long term (current) use of oral hypoglycemic drugs: Secondary | ICD-10-CM | POA: Diagnosis not present

## 2022-04-27 MED ORDER — WEGOVY 1.7 MG/0.75ML ~~LOC~~ SOAJ
1.7000 mg | SUBCUTANEOUS | 0 refills | Status: DC
  Filled 2022-04-27: qty 3, 28d supply, fill #0

## 2022-04-27 MED ORDER — VITAMIN D (ERGOCALCIFEROL) 1.25 MG (50000 UNIT) PO CAPS
50000.0000 [IU] | ORAL_CAPSULE | ORAL | 0 refills | Status: DC
  Filled 2022-04-27 – 2022-05-09 (×2): qty 4, 28d supply, fill #0

## 2022-04-27 NOTE — Progress Notes (Unsigned)
Chief Complaint:   OBESITY [name] is here to discuss her progress with her obesity treatment plan along with follow-up of her obesity related diagnoses. [name] is on the Category 3 Plan and states she is following her eating plan approximately 80% of the time. [name] states she is walking for 25 minutes 5 times per week.  Today's visit was #: 10 Starting weight: 235 lbs Starting date: 11/30/2018 Today's weight: 205 lbs Today's date: 04/27/2022 Total lbs lost to date: 30 Total lbs lost since last in-office visit: 3  Interim History: [name] continues to do very well with weight loss.  Her hunger is mostly controlled.  She has increased stress with family moving in with her.  Subjective:   1. Vitamin D deficiency [name] is on vitamin D, and she requested a refill today.  She is due to have labs soon.  2. Pre-diabetes [name] is stable on metformin, but she misses some doses.  She denies nausea, vomiting, or hypoglycemia.  3. At risk for dehydration [name] is at risk for dehydration due to walking outdoors in increase heat.  Assessment/Plan:   1. Vitamin D deficiency We will refill prescription Vitamin D for 1 month, and we will recheck labs in 1 month.  Y[place]o will follow-up for routine testing of Vitamin D, at least 2-3 times per year to avoid over-replacement.  - Vitamin D, Ergocalciferol, (DRISDOL) 1.25 MG (50000 UNIT) CAPS capsule; Take 1 capsule by mouth every 7 days.  Dispense: 4 capsule; Refill: 0  2. Pre-diabetes Toniyah will continue metformin as is, and will continue to work on weight loss, exercise, and decreasing simple carbohydrates to help decrease the risk of diabetes.  We will plan to recheck labs in 1 month.  3. At risk for dehydration [name] was given approximately 15 minutes of dehydration prevention counseling today. [name] is at risk for dehydration due to weight loss and current medication(s). She was encouraged to hydrate and monitor fluid  status to avoid dehydration as weight loss plateaus.  4. Obesity, Current BMI 36.3 [name] is currently in the action stage of change. As such, her goal is to continue with weight loss efforts. She has agreed to the Category 3 Plan.   We discussed various medication options to help [name] with her weight loss efforts and we both agreed to increase Wegovy to 1.7 mg q week, and we will refill for 1 month.  - Semaglutide-Weight Management (WEGOVY) 1.7 MG/0.75ML SOAJ; Inject 1.7 mg into the skin once a week.  Dispense: 3 mL; Refill: 0  We will recheck fasting labs at her next visit.  Exercise goals: As is.   Behavioral modification strategies: increasing lean protein intake and no skipping meals.  [name] has agreed to follow-up with our clinic in 3 to 4 weeks. She was informed of the importance of frequent follow-up visits to maximize her success with intensive lifestyle modifications for her multiple health conditions.   Objective:   Blood pressure 103/70, pulse 84, temperature 98.2 F (36.8 C), height '5\' 3"'$  (1.6 m), weight 205 lb (93 kg), SpO2 97 %. Body mass index is 36.31 kg/m.  General: Cooperative, alert, well developed, in no acute distress. HEENT: Conjunctivae and lids unremarkable. Cardiovascular: Regular rhythm.  Lungs: Normal work of breathing. Neurologic: No focal deficits.   Lab Results  Component Value Date   CREATININE 0.66 01/23/2022   BUN 11 01/23/2022   NA 139 01/23/2022   K 4.1 01/23/2022   CL 102 01/23/2022   CO2 29  01/23/2022   Lab Results  Component Value Date   ALT 9 10/23/2021   AST 14 10/23/2021   ALKPHOS 70 10/23/2021   BILITOT 0.3 10/23/2021   Lab Results  Component Value Date   HGBA1C 5.6 10/23/2021   HGBA1C 5.5 05/08/2021   HGBA1C 5.6 05/29/2020   HGBA1C 5.4 11/27/2019   HGBA1C 5.4 06/27/2019   Lab Results  Component Value Date   INSULIN 9.3 10/23/2021   INSULIN 22.8 05/08/2021   INSULIN 15.2 05/29/2020   INSULIN 10.3 11/27/2019    INSULIN 12.5 06/27/2019   Lab Results  Component Value Date   TSH 3.820 05/07/2021   Lab Results  Component Value Date   CHOL 188 10/23/2021   HDL 40 10/23/2021   LDLCALC 126 (H) 10/23/2021   TRIG 124 10/23/2021   CHOLHDL 4.4 11/30/2018   Lab Results  Component Value Date   VD25OH 39.1 10/23/2021   VD25OH 39.3 05/08/2021   VD25OH 37.5 05/29/2020   Lab Results  Component Value Date   WBC 11.8 (H) 01/23/2022   HGB 14.6 01/23/2022   HCT 44.7 01/23/2022   MCV 87.5 01/23/2022   PLT 402 (H) 01/23/2022   No results found for: "IRON", "TIBC", "FERRITIN"  Attestation Statements:   Reviewed by clinician on day of visit: allergies, medications, problem list, medical history, surgical history, family history, social history, and previous encounter notes.   I, Trixie Dredge, am acting as transcriptionist for Dennard Nip, MD.  I have reviewed the above documentation for accuracy and completeness, and I agree with the above. -  Dennard Nip, MD

## 2022-04-29 ENCOUNTER — Other Ambulatory Visit (HOSPITAL_COMMUNITY): Payer: Self-pay

## 2022-05-07 ENCOUNTER — Encounter: Payer: Self-pay | Admitting: Cardiovascular Disease

## 2022-05-07 NOTE — Progress Notes (Unsigned)
Cardiology Office Note   Date:  05/07/2022   ID:  Alison, Jenkins 1971-05-02, MRN [id number]  PCP:  Susy Frizzle, MD  Cardiologist:   Mertie Moores, MD   Chief Complaint  Patient presents with   Chest Pain   Problem List 1.  Diastolic dysfunction 2. Hypertension 3. Obesity 4. Palpitations  5. Obstructive sleep apnea - has not been using her CPAP, Needs to see a pulmonologist to get another mask.    [name] is a 51 y.o. female who presents for  Further evaluation of her shortness breath and palpitations. Has had palpitations for several months.   Seem to be worse at night , while watching TV.  Also has dyspnea - primarily at rest.   Not related to lying down. Has DOE Works out at [employer] , has been going for the past month. Has had shortness of breath since getting back to the gym.    Was given lasix 20 as needed - takes it once every 1-2 months.   Seems to need to take the lasix after she has eaten more salty foods than she should .    She knows that she should limit salt intake but has not made any real effort.   Has lost 4 lbs since Jan. 1.  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    Husband is a Hotel manager.   2 boys at home,  Age 88 and 46.   Sept. 7, 2017: [name] is seen for a follow up visit This past Saturday ,  Has had some hot flashes / sweats  HR has been very high for the past several weeks.  (is on adderall)  No diarrhea. No weight loss.   Has not been using her CPAP mask - ends up taking the CPAP mask off after 15 minutes.   Oct. 9, 2017:  [name] is seen back for follow up of Her chronic diastolic congestive heart failure. She was seen in Sept.  We stopped the Bystolic and started Metoprolol 50 BID  Has been checking BP , Now works at [employer].   Mar 17, 2018: Andra is seen back today for further evaluation of her sinus tachycardia and diastolic heart failure. Had partial  left knee replacement [name] )  Occasional episodes of shortness of breath.   Is walking at lunch - gets a little short of breath  Is making progress with her walking   Wt today is 228 lbs.   December 01, 2019:  [name] is seen back today for follow-up of her palpitations and chronic diastolic congestive heart failure. Wt. Today is 203 lbs ( down 25 lbs from last visit)  Rare CP.   Some palpitations  Works at [employer] ( now owned by [employer])  Walking regularly ,   Working on diet .   Is having a new CPAP mask made.    February 10, 2021:  [name] is seen today for follow p of her palpitations and chronic diastolic CHF Has OSA Had covid in jan. Wt today is 211  Up 8 lbs from last yer  Is  exercising  Event monitor showed :   Sinus rhythm with occasional PVCs.  Diastolic BP is elevated.   Sees Dr. Leafy Ro at [facility] program    May 07, 2021: [name] is seen today for a follow-up visit.  Approximately 1 week ago her husband found her  passed out on the commode.  He tried to shake her and arouse her but she was unresponsive.  They called EMS and took her to the hospital.  She was found to have profound hypokalemia with a potassium of 2.7. She was given IV fluids, oral potassium, IV potassium.  Her white blood cell count was elevated at 12.5.  She denies any dysuria or urgency.  She denies any blood in her stool or abdominal pain, cramps.  She denies any sore throat. No COVID symptoms.  Wt is 208 lbs.   HR has been elevated since her visit to the hospital  No TSH recently  Taking lasix 20 mg PRN ( usually this is twice a week)    Previous work-up includes a right left heart catheterization in October, 2019.  She had normal coronary arteries.  She had normal LVEF and normal cardiac output by Fick calculations.  Tries to avoid salt - has done better   Echo from December, 2016 shows normal left ventricular systolic function.  She has grade 1 diastolic  dysfunction.   Feb. 15, 2023 [name] is seen for follow up of her CHF, dyspnea  She has grade 1 DD We changed her lasix to spiro at her last visit Notices she does not urinate as much And feels weak after taking lasix and diuresing  Wt is 215. ( Up 7 lbs from last visit )  January 30, 2022: [name] is here for further discussion of atypical episode of chest pain that she had recently.  During her last visit we added Entresto. Her weight is 212 pounds which is down 3 pounds from last visit.  She was seen in the emergency room on March 10 with episode of 2 days of chest pain. She was prescribed Wegovy but has not started it yet.   Chest pain s come and go,  last several minutes.  Off and on for days at a time  Mid sternal , pressure , no radiation  Had some associated dyspnea  No diaphoresis   Has a strong family hx of premature cad   Labs from Dec. 2022 Chol = 188 HDL = 40 Trigs = 124 LDL = 125   Will start ASA 81 mg a day . NTG 0.4 mg SL PRN   Pain is not related to  eating or drinking    May 08, 2022 [name] is seen today for follow up of her severe CP  Coronary calcium score is 71.7, which places the patient in the 98th percentile for age and sex matched control. RCA - minimal plaque LM - mild CAD LAD - mild mod CAD in prox and mid segments LCx- normal  Aorta - normal  , no dissection     Past Medical History:  Diagnosis Date   Anxiety    Asthma    Back pain    Chest pain    Chronic CHF (HCC)    Dyspnea    Echocardiogram 10/25/2015   EF 60-65, normal wall motion, grade 1 diastolic dysfunction, GLS -19.1%   Edema of left lower extremity    Fracture dislocation of elbow joint 01/15/2013   right   GERD (gastroesophageal reflux disease)    Omeprazole as needed, none in 6 mos.   Hyperlipidemia    Hypertension    Joint pain    Obesity    Palpitations    Prediabetes    R/L Cardiac catheterization 08/18/2018   Normal coronary arteries, mean RA 6, mean PA  17,  PCWP 10   Seborrheic dermatitis    Sleep apnea    uses CPAP "most nights"    Past Surgical History:  Procedure Laterality Date   CESAREAN SECTION  1998; 10/10/2000   x 2   KNEE ARTHROSCOPY Left 1991   also in 2019   [place]  11/02/2006   LAPAROSCOPIC LYSIS OF ADHESIONS  03/06/2005   RADIAL HEAD ARTHROPLASTY Right 01/17/2013   Procedure: RIGHT RADIAL HEAD ARTHROPLASTY OPEN REDUCTION INTERNAL FIXATION CORONOID ;  Surgeon: Tennis Must, MD;  Location: [facility];  Service: Orthopedics;  Laterality: Right;   RIGHT/LEFT HEART CATH AND CORONARY ANGIOGRAPHY N/A 08/18/2018   Procedure: RIGHT/LEFT HEART CATH AND CORONARY ANGIOGRAPHY;  Surgeon: Nelva Bush, MD;  Location: [facility];  Service: Cardiovascular;  Laterality: N/A;   TUBAL LIGATION  03/06/2005     Current Outpatient Medications  Medication Sig Dispense Refill   albuterol (PROVENTIL HFA;VENTOLIN HFA) 108 (90 Base) MCG/ACT inhaler Inhale 2 puffs into the lungs every 6 (six) hours as needed for wheezing. 1 Inhaler 11   ALPRAZolam (XANAX) 0.5 MG tablet Take 1 tablet by mouth 3 times daily as needed. 45 tablet 0   amphetamine-dextroamphetamine (ADDERALL XR) 25 MG 24 hr capsule Take 1 capsule by mouth every morning. 30 capsule 0   aspirin EC 81 MG tablet Take 1 tablet (81 mg total) by mouth daily. Swallow whole.     escitalopram (LEXAPRO) 10 MG tablet Take 1 tablet (10 mg total) by mouth daily. 30 tablet 3   ibuprofen (ADVIL,MOTRIN) 200 MG tablet Take 400 mg by mouth 2 (two) times daily as needed for headache or moderate pain.     Insulin Pen Needle 32G X 4 MM MISC USE ONCE DAILY TO INJECT SAXENDA. 100 each 0   metFORMIN (GLUCOPHAGE) 500 MG tablet Take 1 tablet by mouth in the morning, at noon, and at bedtime. 90 tablet 0   metoprolol tartrate (LOPRESSOR) 50 MG tablet Take 1 tablet (50 mg total) by mouth 2 (two) times daily. 180 tablet 3   nitroGLYCERIN (NITROSTAT) 0.4 MG SL  tablet Place 1 tablet (0.4 mg total) under the tongue every 5 (five) minutes as needed for chest pain. 25 tablet 3   ondansetron (ZOFRAN) 4 MG tablet Take 1 tablet (4 mg total) by mouth every 8 (eight) hours as needed for nausea or vomiting. 20 tablet 0   rosuvastatin (CRESTOR) 20 MG tablet Take 1 tablet  by mouth daily. 90 tablet 3   sacubitril-valsartan (ENTRESTO) 24-26 MG Take 1 tablet by mouth 2 (two) times daily. 60 tablet 6   Semaglutide-Weight Management (WEGOVY) 1.7 MG/0.75ML SOAJ Inject 1.7 mg into the skin once a week. 3 mL 0   spironolactone (ALDACTONE) 25 MG tablet Take 1 tablet (25 mg total) by mouth 3 (three) times a week. 45 tablet 3   sulfamethoxazole-trimethoprim (BACTRIM DS) 800-160 MG tablet Take 1 tablet by mouth every 12 (twelve) hours. 14 tablet 0   Vitamin D, Ergocalciferol, (DRISDOL) 1.25 MG (50000 UNIT) CAPS capsule Take 1 capsule by mouth every 7 days. 4 capsule 0   No current facility-administered medications for this visit.    Allergies:   Adhesive [tape] and Latex    Social History:  The patient  reports that she has never smoked. She has been exposed to tobacco smoke. She has never used smokeless tobacco. She reports that she does not drink alcohol and does not use drugs.   Family History:  The  patient's family history includes Diabetes in her maternal grandmother, mother, paternal grandmother, and sister; Heart disease in her maternal grandfather, maternal grandmother, maternal uncle, and mother; Heart disease (age of onset: 41) in her father; Hypertension in her mother; Liver disease in her paternal aunt; Obesity in her mother; Ovarian cancer in her maternal grandmother; Sudden death in her father.    ROS:   Noted in current history, otherwise review of systems is negative.  Physical Exam: There were no vitals taken for this visit.  GEN:  Well nourished, well developed in no acute distress HEENT: Normal NECK: No JVD; No carotid bruits LYMPHATICS: No  lymphadenopathy CARDIAC: RRR ***, no murmurs, rubs, gallops RESPIRATORY:  Clear to auscultation without rales, wheezing or rhonchi  ABDOMEN: Soft, non-tender, non-distended MUSCULOSKELETAL:  No edema; No deformity  SKIN: Warm and dry NEUROLOGIC:  Alert and oriented x 3     EKG:       Recent Labs: 10/23/2021: ALT 9 01/23/2022: BUN 11; Creatinine, Ser 0.66; Hemoglobin 14.6; Platelets 402; Potassium 4.1; Sodium 139    Lipid Panel    Component Value Date/Time   CHOL 188 10/23/2021 0823   TRIG 124 10/23/2021 0823   HDL 40 10/23/2021 0823   CHOLHDL 4.4 11/30/2018 1047   CHOLHDL 4.2 12/24/2017 0818   VLDL 22 03/11/2015 0847   LDLCALC 126 (H) 10/23/2021 0823   LDLCALC 134 (H) 12/24/2017 0818      Wt Readings from Last 3 Encounters:  04/27/22 205 lb (93 kg)  02/25/22 208 lb (94.3 kg)  02/09/22 210 lb (95.3 kg)      Other studies Reviewed: Additional studies/ records that were reviewed today include: . Review of the above records demonstrates:    ASSESSMENT AND PLAN:  1.   Syncope:      2.  Chronic diastolic congestive heart failure:     3.  Chest pain :        Current medicines are reviewed at length with the patient today.  The patient does not have concerns regarding medicines.  The following changes have been made:  no change  Labs/ tests ordered today include:   No orders of the defined types were placed in this encounter.     Mertie Moores, MD  05/07/2022 7:56 PM    [address], [place], [place]  [address] Phone: [phone]; Fax: [phone]

## 2022-05-08 ENCOUNTER — Ambulatory Visit: Payer: 59 | Admitting: Cardiovascular Disease

## 2022-05-08 ENCOUNTER — Encounter: Payer: Self-pay | Admitting: Cardiovascular Disease

## 2022-05-08 VITALS — BP 112/80 | HR 77 | Ht 63.0 in | Wt 206.0 lb

## 2022-05-08 DIAGNOSIS — E7800 Pure hypercholesterolemia, unspecified: Secondary | ICD-10-CM

## 2022-05-08 DIAGNOSIS — R0602 Shortness of breath: Secondary | ICD-10-CM | POA: Diagnosis not present

## 2022-05-08 LAB — BASIC METABOLIC PANEL
BUN/Creatinine Ratio: 23 (ref 9–23)
BUN: 17 mg/dL (ref 6–24)
CO2: 26 mmol/L (ref 20–29)
Calcium: 9.8 mg/dL (ref 8.7–10.2)
Chloride: 103 mmol/L (ref 96–106)
Creatinine, Ser: 0.75 mg/dL (ref 0.57–1.00)
Glucose: 86 mg/dL (ref 70–99)
Potassium: 4.2 mmol/L (ref 3.5–5.2)
Sodium: 142 mmol/L (ref 134–144)
eGFR: 98 mL/min/{1.73_m2} (ref >59)

## 2022-05-08 LAB — LIPID PANEL
Chol/HDL Ratio: 3 ratio (ref 0.0–4.4)
Cholesterol, Total: 122 mg/dL (ref 100–199)
HDL: 41 mg/dL (ref >39)
LDL Chol Calc (NIH): 64 mg/dL (ref 0–99)
Triglycerides: 89 mg/dL (ref 0–149)
VLDL Cholesterol Cal: 17 mg/dL (ref 5–40)

## 2022-05-08 LAB — ALT: ALT: 12 IU/L (ref 0–32)

## 2022-05-09 ENCOUNTER — Other Ambulatory Visit: Payer: Self-pay | Admitting: Family Medicine

## 2022-05-09 ENCOUNTER — Other Ambulatory Visit: Payer: Self-pay | Admitting: Cardiovascular Disease

## 2022-05-09 ENCOUNTER — Other Ambulatory Visit (INDEPENDENT_AMBULATORY_CARE_PROVIDER_SITE_OTHER): Payer: Self-pay | Admitting: Family Medicine

## 2022-05-09 ENCOUNTER — Other Ambulatory Visit (HOSPITAL_COMMUNITY): Payer: Self-pay

## 2022-05-09 DIAGNOSIS — R7303 Prediabetes: Secondary | ICD-10-CM

## 2022-05-11 ENCOUNTER — Other Ambulatory Visit (HOSPITAL_COMMUNITY): Payer: Self-pay

## 2022-05-11 MED ORDER — SPIRONOLACTONE 25 MG PO TABS
25.0000 mg | ORAL_TABLET | ORAL | 3 refills | Status: DC
  Filled 2022-05-11: qty 45, 105d supply, fill #0
  Filled 2022-06-15: qty 39, 90d supply, fill #0
  Filled 2022-10-16: qty 39, 90d supply, fill #1
  Filled 2023-03-02: qty 39, 90d supply, fill #2

## 2022-05-12 ENCOUNTER — Other Ambulatory Visit (HOSPITAL_COMMUNITY): Payer: Self-pay

## 2022-05-13 ENCOUNTER — Other Ambulatory Visit (HOSPITAL_COMMUNITY): Payer: Self-pay

## 2022-05-23 ENCOUNTER — Other Ambulatory Visit (HOSPITAL_COMMUNITY): Payer: Self-pay

## 2022-05-26 ENCOUNTER — Other Ambulatory Visit (HOSPITAL_COMMUNITY): Payer: Self-pay

## 2022-05-27 ENCOUNTER — Encounter: Payer: Self-pay | Admitting: Family Medicine

## 2022-05-27 NOTE — Telephone Encounter (Signed)
LOV 05/09/21 Last refill 03/20/22, #30, 0 refills  Please review, thanks!

## 2022-05-28 ENCOUNTER — Encounter (INDEPENDENT_AMBULATORY_CARE_PROVIDER_SITE_OTHER): Payer: Self-pay | Admitting: Family Medicine

## 2022-05-28 ENCOUNTER — Ambulatory Visit (INDEPENDENT_AMBULATORY_CARE_PROVIDER_SITE_OTHER): Payer: 59 | Admitting: Family Medicine

## 2022-05-28 ENCOUNTER — Other Ambulatory Visit (HOSPITAL_COMMUNITY): Payer: Self-pay

## 2022-05-28 VITALS — BP 96/65 | HR 70 | Temp 98.0°F | Ht 63.0 in | Wt 204.0 lb

## 2022-05-28 DIAGNOSIS — E669 Obesity, unspecified: Secondary | ICD-10-CM | POA: Diagnosis not present

## 2022-05-28 DIAGNOSIS — Z6836 Body mass index (BMI) 36.0-36.9, adult: Secondary | ICD-10-CM | POA: Diagnosis not present

## 2022-05-28 DIAGNOSIS — R7303 Prediabetes: Secondary | ICD-10-CM

## 2022-05-28 DIAGNOSIS — E559 Vitamin D deficiency, unspecified: Secondary | ICD-10-CM

## 2022-05-28 MED ORDER — AMPHETAMINE-DEXTROAMPHET ER 25 MG PO CP24
25.0000 mg | ORAL_CAPSULE | Freq: Every morning | ORAL | 0 refills | Status: DC
Start: 2022-05-28 — End: 2022-07-07
  Filled 2022-05-28: qty 30, 30d supply, fill #0

## 2022-05-29 LAB — CMP14+EGFR
ALT: 11 IU/L (ref 0–32)
AST: 14 IU/L (ref 0–40)
Albumin/Globulin Ratio: 1.6 (ref 1.2–2.2)
Albumin: 4.3 g/dL (ref 3.9–4.9)
Alkaline Phosphatase: 68 IU/L (ref 44–121)
BUN/Creatinine Ratio: 20 (ref 9–23)
BUN: 13 mg/dL (ref 6–24)
Bilirubin Total: 0.3 mg/dL (ref 0.0–1.2)
CO2: 24 mmol/L (ref 20–29)
Calcium: 9.6 mg/dL (ref 8.7–10.2)
Chloride: 102 mmol/L (ref 96–106)
Creatinine, Ser: 0.66 mg/dL (ref 0.57–1.00)
Globulin, Total: 2.7 g/dL (ref 1.5–4.5)
Glucose: 74 mg/dL (ref 70–99)
Potassium: 4.7 mmol/L (ref 3.5–5.2)
Sodium: 140 mmol/L (ref 134–144)
Total Protein: 7 g/dL (ref 6.0–8.5)
eGFR: 108 mL/min/{1.73_m2} (ref >59)

## 2022-05-29 LAB — HEMOGLOBIN A1C
Est. average glucose Bld gHb Est-mCnc: 111 mg/dL
Hgb A1c MFr Bld: 5.5 % (ref 4.8–5.6)

## 2022-05-29 LAB — VITAMIN D 25 HYDROXY (VIT D DEFICIENCY, FRACTURES): Vit D, 25-Hydroxy: 59.4 ng/mL (ref 30.0–100.0)

## 2022-05-29 LAB — INSULIN, RANDOM: INSULIN: 12.5 u[IU]/mL (ref 2.6–24.9)

## 2022-06-01 NOTE — Progress Notes (Signed)
Chief Complaint:   OBESITY [name] is here to discuss her progress with her obesity treatment plan along with follow-up of her obesity related diagnoses. [name] is on the Category 3 Plan and states she is following her eating plan approximately 80% of the time. [name] states she is doing 0 minutes 0 times per week.  Today's visit was #: 31 Starting weight: 235 lbs Starting date: 11/30/2018 Today's weight: 204 lbs Today's date: 05/28/2022 Total lbs lost to date: 31 Total lbs lost since last in-office visit: 1  Interim History: Trany continues to do well with weight loss.  Her hunger is mostly controlled, but she notes some increased weeks after lunch.  She notes increased constipation on Wegovy 1.7 mg.  Subjective:   1. Pre-diabetes [name] is working on her diet and she is on GLP-1.  She is due for labs.  2. Vitamin D deficiency [name] is on vitamin D, and she is due to have labs checked.  Assessment/Plan:   1. Pre-diabetes We will check labs today. [name] will continue to work on her diet, exercise, and decreasing simple carbohydrates to help decrease the risk of diabetes.   - CMP14+EGFR - Insulin, random - Hemoglobin A1c  2. Vitamin D deficiency We will check labs today. [name] will follow-up for routine testing of Vitamin D, at least 2-3 times per year to avoid over-replacement.  - VITAMIN D 25 Hydroxy (Vit-D Deficiency, Fractures)  3. Obesity, Current BMI 36.2 [name] is currently in the action stage of change. As such, her goal is to continue with weight loss efforts. She has agreed to the Category 3 Plan.   [name] is to continue to increase her water intake, and add OTC MiraLAX 17 g/day.  Behavioral modification strategies: increasing lean protein intake, increasing water intake, and increasing high fiber foods.  [name] has agreed to follow-up with our clinic in 3 to 4 weeks. She was informed of the importance of frequent follow-up visits to maximize  her success with intensive lifestyle modifications for her multiple health conditions.   [name] was informed we would discuss her lab results at her next visit unless there is a critical issue that needs to be addressed sooner. [name] agreed to keep her next visit at the agreed upon time to discuss these results.  Objective:   Blood pressure 96/65, pulse 70, temperature 98 F (36.7 C), height $RemoveBe'5\' 3"'MsyoDZHLK$  (1.6 m), weight 204 lb (92.5 kg), SpO2 95 %. Body mass index is 36.14 kg/m.  General: Cooperative, alert, well developed, in no acute distress. HEENT: Conjunctivae and lids unremarkable. Cardiovascular: Regular rhythm.  Lungs: Normal work of breathing. Neurologic: No focal deficits.   Lab Results  Component Value Date   CREATININE 0.66 05/28/2022   BUN 13 05/28/2022   NA 140 05/28/2022   K 4.7 05/28/2022   CL 102 05/28/2022   CO2 24 05/28/2022   Lab Results  Component Value Date   ALT 11 05/28/2022   AST 14 05/28/2022   ALKPHOS 68 05/28/2022   BILITOT 0.3 05/28/2022   Lab Results  Component Value Date   HGBA1C 5.5 05/28/2022   HGBA1C 5.6 10/23/2021   HGBA1C 5.5 05/08/2021   HGBA1C 5.6 05/29/2020   HGBA1C 5.4 11/27/2019   Lab Results  Component Value Date   INSULIN 12.5 05/28/2022   INSULIN 9.3 10/23/2021   INSULIN 22.8 05/08/2021   INSULIN 15.2 05/29/2020   INSULIN 10.3 11/27/2019   Lab Results  Component Value Date   TSH 3.820 05/07/2021  Lab Results  Component Value Date   CHOL 122 05/08/2022   HDL 41 05/08/2022   LDLCALC 64 05/08/2022   TRIG 89 05/08/2022   CHOLHDL 3.0 05/08/2022   Lab Results  Component Value Date   VD25OH 59.4 05/28/2022   VD25OH 39.1 10/23/2021   VD25OH 39.3 05/08/2021   Lab Results  Component Value Date   WBC 11.8 (H) 01/23/2022   HGB 14.6 01/23/2022   HCT 44.7 01/23/2022   MCV 87.5 01/23/2022   PLT 402 (H) 01/23/2022   No results found for: "IRON", "TIBC", "FERRITIN"  Attestation Statements:   Reviewed by clinician  on day of visit: allergies, medications, problem list, medical history, surgical history, family history, social history, and previous encounter notes.   I, Trixie Dredge, am acting as transcriptionist for Dennard Nip, MD.  I have reviewed the above documentation for accuracy and completeness, and I agree with the above. -  Dennard Nip, MD

## 2022-06-04 ENCOUNTER — Other Ambulatory Visit (HOSPITAL_COMMUNITY): Payer: Self-pay

## 2022-06-04 ENCOUNTER — Emergency Department (HOSPITAL_BASED_OUTPATIENT_CLINIC_OR_DEPARTMENT_OTHER)
Admission: EM | Admit: 2022-06-04 | Discharge: 2022-06-04 | Disposition: A | Discharge status: Home / Self Care | Payer: 59 | Attending: Emergency Medicine | Admitting: Emergency Medicine

## 2022-06-04 ENCOUNTER — Other Ambulatory Visit: Payer: Self-pay

## 2022-06-04 ENCOUNTER — Encounter (HOSPITAL_BASED_OUTPATIENT_CLINIC_OR_DEPARTMENT_OTHER): Payer: Self-pay | Admitting: Emergency Medicine

## 2022-06-04 ENCOUNTER — Emergency Department (HOSPITAL_BASED_OUTPATIENT_CLINIC_OR_DEPARTMENT_OTHER): Payer: 59

## 2022-06-04 DIAGNOSIS — R112 Nausea with vomiting, unspecified: Secondary | ICD-10-CM | POA: Insufficient documentation

## 2022-06-04 DIAGNOSIS — I509 Heart failure, unspecified: Secondary | ICD-10-CM | POA: Diagnosis not present

## 2022-06-04 DIAGNOSIS — J45909 Unspecified asthma, uncomplicated: Secondary | ICD-10-CM | POA: Insufficient documentation

## 2022-06-04 DIAGNOSIS — S0990XA Unspecified injury of head, initial encounter: Secondary | ICD-10-CM | POA: Diagnosis not present

## 2022-06-04 DIAGNOSIS — E860 Dehydration: Secondary | ICD-10-CM | POA: Diagnosis not present

## 2022-06-04 DIAGNOSIS — R55 Syncope and collapse: Secondary | ICD-10-CM | POA: Diagnosis not present

## 2022-06-04 DIAGNOSIS — Z951 Presence of aortocoronary bypass graft: Secondary | ICD-10-CM | POA: Diagnosis not present

## 2022-06-04 DIAGNOSIS — W2201XA Walked into wall, initial encounter: Secondary | ICD-10-CM | POA: Diagnosis not present

## 2022-06-04 DIAGNOSIS — Z7982 Long term (current) use of aspirin: Secondary | ICD-10-CM | POA: Insufficient documentation

## 2022-06-04 DIAGNOSIS — I110 Hypertensive heart disease with heart failure: Secondary | ICD-10-CM | POA: Diagnosis not present

## 2022-06-04 DIAGNOSIS — S022XXA Fracture of nasal bones, initial encounter for closed fracture: Secondary | ICD-10-CM | POA: Insufficient documentation

## 2022-06-04 DIAGNOSIS — S199XXA Unspecified injury of neck, initial encounter: Secondary | ICD-10-CM | POA: Diagnosis not present

## 2022-06-04 DIAGNOSIS — S0992XA Unspecified injury of nose, initial encounter: Secondary | ICD-10-CM | POA: Diagnosis present

## 2022-06-04 LAB — CBC
HCT: 44.6 % (ref 36.0–46.0)
Hemoglobin: 14.9 g/dL (ref 12.0–15.0)
MCH: 29.4 pg (ref 26.0–34.0)
MCHC: 33.4 g/dL (ref 30.0–36.0)
MCV: 88 fL (ref 80.0–100.0)
Platelets: 301 10*3/uL (ref 150–400)
RBC: 5.07 MIL/uL (ref 3.87–5.11)
RDW: 13.2 % (ref 11.5–15.5)
WBC: 15.3 10*3/uL — ABNORMAL HIGH (ref 4.0–10.5)
nRBC: 0 % (ref 0.0–0.2)

## 2022-06-04 LAB — COMPREHENSIVE METABOLIC PANEL
ALT: 11 U/L (ref 0–44)
AST: 15 U/L (ref 15–41)
Albumin: 4.2 g/dL (ref 3.5–5.0)
Alkaline Phosphatase: 59 U/L (ref 38–126)
Anion gap: 11 (ref 5–15)
BUN: 18 mg/dL (ref 6–20)
CO2: 25 mmol/L (ref 22–32)
Calcium: 9.7 mg/dL (ref 8.9–10.3)
Chloride: 103 mmol/L (ref 98–111)
Creatinine, Ser: 0.75 mg/dL (ref 0.44–1.00)
GFR, Estimated: >60 mL/min (ref >60)
Glucose, Bld: 116 mg/dL — ABNORMAL HIGH (ref 70–99)
Potassium: 3.8 mmol/L (ref 3.5–5.1)
Sodium: 139 mmol/L (ref 135–145)
Total Bilirubin: 0.5 mg/dL (ref 0.3–1.2)
Total Protein: 7.4 g/dL (ref 6.5–8.1)

## 2022-06-04 LAB — PROTIME-INR
INR: 1 (ref 0.8–1.2)
Prothrombin Time: 13.4 seconds (ref 11.4–15.2)

## 2022-06-04 LAB — LIPASE, BLOOD: Lipase: 34 U/L (ref 11–51)

## 2022-06-04 MED ORDER — SODIUM CHLORIDE 0.9 % IV BOLUS
1000.0000 mL | Freq: Once | INTRAVENOUS | Status: AC
  Administered 2022-06-04: 1000 mL via INTRAVENOUS

## 2022-06-04 MED ORDER — ONDANSETRON 4 MG PO TBDP
4.0000 mg | ORAL_TABLET | Freq: Three times a day (TID) | ORAL | 0 refills | Status: DC | PRN
  Filled 2022-06-04: qty 20, 7d supply, fill #0

## 2022-06-04 MED ORDER — ONDANSETRON HCL 4 MG/2ML IJ SOLN
4.0000 mg | Freq: Once | INTRAMUSCULAR | Status: AC
  Administered 2022-06-04: 4 mg via INTRAVENOUS
  Filled 2022-06-04: qty 2

## 2022-06-04 MED ORDER — ACETAMINOPHEN 500 MG PO TABS
1000.0000 mg | ORAL_TABLET | Freq: Once | ORAL | Status: AC
  Administered 2022-06-04: 1000 mg via ORAL
  Filled 2022-06-04: qty 2

## 2022-06-04 MED ORDER — LOPERAMIDE HCL 2 MG PO CAPS
2.0000 mg | ORAL_CAPSULE | Freq: Four times a day (QID) | ORAL | 0 refills | Status: DC | PRN
  Filled 2022-06-04: qty 12, 3d supply, fill #0

## 2022-06-04 NOTE — ED Provider Notes (Signed)
[facility] Emergency Department Provider Note MRN:  [id number]  Arrival date & time: 06/04/22     Chief Complaint   Loss of Consciousness   History of Present Illness   [name] is a 51 y.o. year-old female with a history of HTN presenting to the ED with chief complaint of LOC.  Patient was experiencing nausea and persistent vomiting this evening.  Got up from the floor to try to have some diarrhea on the toilet and then passed out.  Hit head on the tub.  Endorsing headache, facial pain.  Still feeling nauseated, generally unwell.  Review of Systems  A thorough review of systems was obtained and all systems are negative except as noted in the HPI and PMH.   Patient's Health History    Past Medical History:  Diagnosis Date   Anxiety    Asthma    Back pain    Chest pain    Chronic CHF (HCC)    Dyspnea    Echocardiogram 10/25/2015   EF 60-65, normal wall motion, grade 1 diastolic dysfunction, GLS -19.1%   Edema of left lower extremity    Fracture dislocation of elbow joint 01/15/2013   right   GERD (gastroesophageal reflux disease)    Omeprazole as needed, none in 6 mos.   Hyperlipidemia    Hypertension    Joint pain    Obesity    Palpitations    Prediabetes    R/L Cardiac catheterization 08/18/2018   Normal coronary arteries, mean RA 6, mean PA 17, PCWP 10   Seborrheic dermatitis    Sleep apnea    uses CPAP "most nights"    Past Surgical History:  Procedure Laterality Date   CESAREAN SECTION  1998; 10/10/2000   x 2   KNEE ARTHROSCOPY Left 1991   also in 2019   [place]  11/02/2006   LAPAROSCOPIC LYSIS OF ADHESIONS  03/06/2005   RADIAL HEAD ARTHROPLASTY Right 01/17/2013   Procedure: RIGHT RADIAL HEAD ARTHROPLASTY OPEN REDUCTION INTERNAL FIXATION CORONOID ;  Surgeon: Tennis Must, MD;  Location: [facility];  Service: Orthopedics;  Laterality: Right;   RIGHT/LEFT HEART CATH AND CORONARY  ANGIOGRAPHY N/A 08/18/2018   Procedure: RIGHT/LEFT HEART CATH AND CORONARY ANGIOGRAPHY;  Surgeon: Nelva Bush, MD;  Location: [facility];  Service: Cardiovascular;  Laterality: N/A;   TUBAL LIGATION  03/06/2005    Family History  Problem Relation Age of Onset   Diabetes Sister    Heart disease Father 5       MI--1st Dx CAD. Died2that time   Sudden death Father    Diabetes Mother    Hypertension Mother    Heart disease Mother    Obesity Mother    Heart disease Maternal Uncle    Ovarian cancer Maternal Grandmother    Diabetes Maternal Grandmother    Heart disease Maternal Grandmother        Great Grandmother   Diabetes Paternal Grandmother    Liver disease Paternal Aunt    Heart disease Maternal Grandfather     Social History   Socioeconomic History   Marital status: Married    Spouse name: Reyanna Baley   Number of children: 2   Years of education: Not on file   Highest education level: Not on file  Occupational History   Occupation: CMA    Employer: [employer]  Tobacco Use   Smoking status: Never    Passive exposure: Past  Smokeless tobacco: Never  Substance and Sexual Activity   Alcohol use: No   Drug use: No   Sexual activity: Not on file  Other Topics Concern   Not on file  Social History Narrative   Not on file   Social Determinants of Health   Financial Resource Strain: Not on file  Food Insecurity: Not on file  Transportation Needs: Not on file  Physical Activity: Not on file  Stress: Not on file  Social Connections: Not on file  Intimate Partner Violence: Not on file     Physical Exam   Vitals:   06/04/22 0300 06/04/22 0445  BP: 112/67 123/60  Pulse: 86 88  Resp: (!) 21 20  Temp:    SpO2: 99% 94%    CONSTITUTIONAL: Well-appearing, NAD NEURO/PSYCH:  Alert and oriented x 3, normal and symmetric strength and sensation, normal coordination, normal speech EYES:  eyes equal and reactive ENT/NECK:  no LAD, no JVD, dried blood right  nare CARDIO: Regular rate, well-perfused, normal S1 and S2 PULM:  CTAB no wheezing or rhonchi GI/GU:  non-distended, non-tender MSK/SPINE:  No gross deformities, no edema SKIN:  no rash, atraumatic   *Additional and/or pertinent findings included in MDM below  Diagnostic and Interventional Summary    EKG Interpretation  Date/Time:  Thursday June 04 2022 02:53:39 EDT Ventricular Rate:  81 PR Interval:  148 QRS Duration: 91 QT Interval:  402 QTC Calculation: 467 R Axis:   38 Text Interpretation: Sinus rhythm Confirmed by Gerlene Fee [phone]) on 06/04/2022 3:13:00 AM       Labs Reviewed  CBC - Abnormal; Notable for the following components:      Result Value   WBC 15.3 (*)    All other components within normal limits  COMPREHENSIVE METABOLIC PANEL - Abnormal; Notable for the following components:   Glucose, Bld 116 (*)    All other components within normal limits  LIPASE, BLOOD  PROTIME-INR    CT HEAD WO CONTRAST (5MM)  Final Result    CT CERVICAL SPINE WO CONTRAST  Final Result    CT MAXILLOFACIAL WO CONTRAST  Final Result      Medications  sodium chloride 0.9 % bolus 1,000 mL (1,000 mLs Intravenous New Bag/Given 06/04/22 0325)  ondansetron (ZOFRAN) injection 4 mg (4 mg Intravenous Given 06/04/22 0326)  acetaminophen (TYLENOL) tablet 1,000 mg (1,000 mg Oral Given 06/04/22 0325)     Procedures  /  Critical Care Procedures  ED Course and Medical Decision Making  Initial Impression and Ddx Suspect syncopal episode due to dehydration in the setting of viral gastroenteritis.  Abdomen soft nontender, vital signs normal.  Will obtain CT imaging to exclude significant traumatic injury to the head, face, neck.  Will evaluate with labs to check for electrolyte disturbance or AKI.  Past medical/surgical history that increases complexity of ED encounter: None  Interpretation of Diagnostics I personally reviewed the EKG and my interpretation is as follows: Sinus  rhythm  Labs are reassuring without significant blood count or electrolyte disturbance.  CT imaging revealing nasal bone fracture but no other significant injuries  Patient Reassessment and Ultimate Disposition/Management     Patient feeling better on reassessment after fluids, appropriate for discharge.  Patient management required discussion with the following services or consulting groups:  None  Complexity of Problems Addressed Acute illness or injury that poses threat of life of bodily function  Additional Data Reviewed and Analyzed Further history obtained from: Further history from spouse/family member  Additional  Factors Impacting ED Encounter Risk Consideration of hospitalization  Barth Kirks. [place] Small, [place] [email or web address]  Final Clinical Impressions(s) / ED Diagnoses     ICD-10-CM   1. Closed fracture of nasal bone, initial encounter  S02.2XXA     2. Syncope, unspecified syncope type  R55     3. Nausea and vomiting, unspecified vomiting type  R11.2     4. Dehydration  E86.0       ED Discharge Orders          Ordered    ondansetron (ZOFRAN-ODT) 4 MG disintegrating tablet  Every 8 hours PRN        06/04/22 0451    loperamide (IMODIUM) 2 MG capsule  4 times daily PRN        06/04/22 0451             Discharge Instructions Discussed with and Provided to Patient:     Discharge Instructions      You were evaluated in the Emergency Department and after careful evaluation, we did not find any emergent condition requiring admission or further testing in the hospital.  Your exam/testing today is overall reassuring.  Symptoms seem to be due to a viral gastroenteritis causing dehydration, leading to the fainting spell.  You broke your nose but have no other significant injuries.  Recommend Tylenol or Motrin at home for discomfort, use the Zofran as needed for nausea, use the Imodium as needed for  diarrhea.  Please return to the Emergency Department if you experience any worsening of your condition.   Thank you for allowing [name] to be a part of your care.       Maudie Flakes, MD 06/04/22 [phone]

## 2022-06-04 NOTE — Discharge Instructions (Signed)
You were evaluated in the Emergency Department and after careful evaluation, we did not find any emergent condition requiring admission or further testing in the hospital.  Your exam/testing today is overall reassuring.  Symptoms seem to be due to a viral gastroenteritis causing dehydration, leading to the fainting spell.  You broke your nose but have no other significant injuries.  Recommend Tylenol or Motrin at home for discomfort, use the Zofran as needed for nausea, use the Imodium as needed for diarrhea.  Please return to the Emergency Department if you experience any worsening of your condition.   Thank you for allowing [name] to be a part of your care.

## 2022-06-04 NOTE — ED Triage Notes (Signed)
Pt c/o LOC while sitting on the toilet. Pt states that she didn't feel well before going to bed. Pt hit her face on the wall when she lost consciousness. Pt takes '81mg'$  ASA every day.

## 2022-06-15 ENCOUNTER — Other Ambulatory Visit (HOSPITAL_COMMUNITY): Payer: Self-pay

## 2022-06-15 ENCOUNTER — Telehealth: Payer: Self-pay | Admitting: Cardiovascular Disease

## 2022-06-15 NOTE — Telephone Encounter (Signed)
    Pre-operative Risk Assessment    Patient Name: [name]  DOB: 1971/07/26 MRN: [id number]     Request for Surgical Clearance    Procedure:  Dental Extraction - Amount of Teeth to be Pulled:  1 tooth, with gum therapy and advance cleaning and root canal at #8  Date of Surgery:  Clearance 06/17/22                                 Surgeon:  Dr. Lesleigh Noe  Surgeon's Group or Practice Name:  [facility]  Phone number:  [phone] Fax number:  [phone]   Type of Clearance Requested:   - Medical  - Pharmacy:  Hold spironolactone  1 day   Type of Anesthesia:   IV sedation    Additional requests/questions:    Signed, Angeline Kirby Funk   06/15/2022, 9:30 AM

## 2022-06-15 NOTE — Telephone Encounter (Signed)
    Primary Cardiologist: Mertie Moores, MD  Chart reviewed as part of pre-operative protocol coverage. Simple dental extractions are considered low risk procedures per guidelines and generally do not require any specific cardiac clearance. It is also generally accepted that for simple extractions and dental cleanings, there is no need to interrupt blood thinner therapy.   SBE prophylaxis is not required for the patient.  I will route this recommendation to the requesting party via Epic fax function and remove from pre-op pool.  Please call with questions.  Deberah Pelton, NP 06/15/2022, 9:42 AM

## 2022-06-16 ENCOUNTER — Telehealth: Payer: Self-pay | Admitting: Cardiovascular Disease

## 2022-06-16 NOTE — Telephone Encounter (Signed)
Pharmacist has reviewed and placed his recommendations.  Please forward to requesting office regarding clearance.  Ambrose Pancoast, NP

## 2022-06-16 NOTE — Telephone Encounter (Signed)
Shasta from Dr. Lavonne Chick, [place] office called stating that Dr. Lavonne Chick, DDS needs to know if the pt is cleared to have IV SEDATION for dental procedure as well as would like it noted that the medications have been reviewed. I assured Jonna Coup that I will have pre op provider review and amend clearance notes as seen fit.   I will make note of the medications to be used in the IV SEDATION per the clearance request.    IV SEDATION MEDICATIONS: TRIAZOLAM LORAZEPAM HYDROXYZINE VERSED FENTANYL DEXAMETHASONE TORADOL DEMEROL

## 2022-06-16 NOTE — Telephone Encounter (Signed)
Patient has diastolic dysfunction, SOB, palpitations, and OSA. This question is beyond the scope of the preop clearance pool.

## 2022-06-16 NOTE — Telephone Encounter (Signed)
Shasta returned call, call transferred to Julaine Hua, [place].

## 2022-06-16 NOTE — Telephone Encounter (Signed)
Caller returned RN's call.  Call back# [phone].

## 2022-06-24 ENCOUNTER — Encounter (INDEPENDENT_AMBULATORY_CARE_PROVIDER_SITE_OTHER): Payer: Self-pay

## 2022-06-25 ENCOUNTER — Encounter (INDEPENDENT_AMBULATORY_CARE_PROVIDER_SITE_OTHER): Payer: Self-pay | Admitting: Family Medicine

## 2022-06-25 ENCOUNTER — Other Ambulatory Visit (HOSPITAL_COMMUNITY): Payer: Self-pay

## 2022-06-25 ENCOUNTER — Ambulatory Visit (INDEPENDENT_AMBULATORY_CARE_PROVIDER_SITE_OTHER): Payer: 59 | Admitting: Family Medicine

## 2022-06-25 VITALS — BP 91/60 | HR 73 | Temp 97.9°F | Ht 63.0 in | Wt 196.0 lb

## 2022-06-25 DIAGNOSIS — R7303 Prediabetes: Secondary | ICD-10-CM | POA: Diagnosis not present

## 2022-06-25 DIAGNOSIS — Z6834 Body mass index (BMI) 34.0-34.9, adult: Secondary | ICD-10-CM

## 2022-06-25 DIAGNOSIS — E669 Obesity, unspecified: Secondary | ICD-10-CM

## 2022-06-25 DIAGNOSIS — E860 Dehydration: Secondary | ICD-10-CM | POA: Diagnosis not present

## 2022-06-25 MED ORDER — METFORMIN HCL 500 MG PO TABS
500.0000 mg | ORAL_TABLET | Freq: Three times a day (TID) | ORAL | 0 refills | Status: DC
  Filled 2022-06-25: qty 30, 10d supply, fill #0

## 2022-06-25 MED ORDER — WEGOVY 1.7 MG/0.75ML ~~LOC~~ SOAJ
1.7000 mg | SUBCUTANEOUS | 0 refills | Status: DC
  Filled 2022-06-25: qty 3, 28d supply, fill #0

## 2022-06-26 ENCOUNTER — Other Ambulatory Visit (HOSPITAL_COMMUNITY): Payer: Self-pay

## 2022-06-30 ENCOUNTER — Telehealth (INDEPENDENT_AMBULATORY_CARE_PROVIDER_SITE_OTHER): Payer: Self-pay | Admitting: Family Medicine

## 2022-06-30 ENCOUNTER — Encounter (INDEPENDENT_AMBULATORY_CARE_PROVIDER_SITE_OTHER): Payer: Self-pay

## 2022-06-30 NOTE — Telephone Encounter (Signed)
Dr. Leafy Ro - Prior authorization approved for [facility]. Effective:06/30/2022 to 06/30/2023. Patient sent approval message via mychart.

## 2022-07-03 ENCOUNTER — Other Ambulatory Visit (HOSPITAL_COMMUNITY): Payer: Self-pay

## 2022-07-06 NOTE — Progress Notes (Unsigned)
Chief Complaint:   OBESITY [name] is here to discuss her progress with her obesity treatment plan along with follow-up of her obesity related diagnoses. [name] is on the Category 3 Plan and states she is following her eating plan approximately 50% of the time. [name] states she is doing 0 minutes 0 times per week.  Today's visit was #: 13 Starting weight: 235 lbs Starting date: 11/30/2018 Today's weight: 196 lbs Today's date: 06/25/2022 Total lbs lost to date: 39 Total lbs lost since last in-office visit: 8  Interim History: [name] had a GI bug recently, and she passed out and broke her nose and broke some teeth. She can only eat soft foods.   Subjective:   1. Pre-diabetes [name] is working on her diet and exercise, and she has no problems with metformin or her GLP-1.  2. Dehydration [name] is down in water weight likely due to dehydration, especially with recent illness.  Assessment/Plan:   1. Pre-diabetes We will refill metformin for 1 month. [name] will continue to work on weight loss, exercise, and decreasing simple carbohydrates to help decrease the risk of diabetes.   - metFORMIN (GLUCOPHAGE) 500 MG tablet; Take 1 tablet by mouth in the morning, at noon, and at bedtime.  Dispense: 30 tablet; Refill: 0  2. Dehydration [name] is to increase her water intake until her urine is almost clear, and we will follow-up.  3. Obesity, Current BMI 34.8 [name] is currently in the action stage of change. As such, her goal is to continue with weight loss efforts. She has agreed to practicing portion control and making smarter food choices, such as increasing vegetables and decreasing simple carbohydrates.   Soft protein rich foods were discussed.   We discussed various medication options to help [name] with her weight loss efforts and we both agreed to continue Wegovy 1.7 mg once weekly, and we will refill for 1 month.  - Semaglutide-Weight Management (WEGOVY) 1.7  MG/0.75ML SOAJ; Inject 1.7 mg into the skin once a week.  Dispense: 3 mL; Refill: 0  Behavioral modification strategies: increasing lean protein intake and increasing water intake.  [name] has agreed to follow-up with our clinic in 3 to 4 weeks. She was informed of the importance of frequent follow-up visits to maximize her success with intensive lifestyle modifications for her multiple health conditions.   Objective:   Blood pressure 91/60, pulse 73, temperature 97.9 F (36.6 C), height '5\' 3"'$  (1.6 m), weight 196 lb (88.9 kg), SpO2 97 %. Body mass index is 34.72 kg/m.  General: Cooperative, alert, well developed, in no acute distress. HEENT: Conjunctivae and lids unremarkable. Cardiovascular: Regular rhythm.  Lungs: Normal work of breathing. Neurologic: No focal deficits.   Lab Results  Component Value Date   CREATININE 0.75 06/04/2022   BUN 18 06/04/2022   NA 139 06/04/2022   K 3.8 06/04/2022   CL 103 06/04/2022   CO2 25 06/04/2022   Lab Results  Component Value Date   ALT 11 06/04/2022   AST 15 06/04/2022   ALKPHOS 59 06/04/2022   BILITOT 0.5 06/04/2022   Lab Results  Component Value Date   HGBA1C 5.5 05/28/2022   HGBA1C 5.6 10/23/2021   HGBA1C 5.5 05/08/2021   HGBA1C 5.6 05/29/2020   HGBA1C 5.4 11/27/2019   Lab Results  Component Value Date   INSULIN 12.5 05/28/2022   INSULIN 9.3 10/23/2021   INSULIN 22.8 05/08/2021   INSULIN 15.2 05/29/2020   INSULIN 10.3 11/27/2019   Lab Results  Component Value Date   TSH 3.820 05/07/2021   Lab Results  Component Value Date   CHOL 122 05/08/2022   HDL 41 05/08/2022   LDLCALC 64 05/08/2022   TRIG 89 05/08/2022   CHOLHDL 3.0 05/08/2022   Lab Results  Component Value Date   VD25OH 59.4 05/28/2022   VD25OH 39.1 10/23/2021   VD25OH 39.3 05/08/2021   Lab Results  Component Value Date   WBC 15.3 (H) 06/04/2022   HGB 14.9 06/04/2022   HCT 44.6 06/04/2022   MCV 88.0 06/04/2022   PLT 301 06/04/2022   No  results found for: "IRON", "TIBC", "FERRITIN"  Attestation Statements:   Reviewed by clinician on day of visit: allergies, medications, problem list, medical history, surgical history, family history, social history, and previous encounter notes.   I, Trixie Dredge, am acting as transcriptionist for Dennard Nip, MD.  I have reviewed the above documentation for accuracy and completeness, and I agree with the above. -  Dennard Nip, MD

## 2022-07-07 ENCOUNTER — Other Ambulatory Visit (HOSPITAL_COMMUNITY): Payer: Self-pay

## 2022-07-07 ENCOUNTER — Ambulatory Visit: Payer: 59 | Admitting: Family Medicine

## 2022-07-07 ENCOUNTER — Encounter: Payer: Self-pay | Admitting: Family Medicine

## 2022-07-07 VITALS — BP 118/76 | HR 82 | Temp 98.5°F | Ht 63.0 in | Wt 201.2 lb

## 2022-07-07 DIAGNOSIS — I2510 Atherosclerotic heart disease of native coronary artery without angina pectoris: Secondary | ICD-10-CM

## 2022-07-07 DIAGNOSIS — I5032 Chronic diastolic (congestive) heart failure: Secondary | ICD-10-CM | POA: Diagnosis not present

## 2022-07-07 DIAGNOSIS — F988 Other specified behavioral and emotional disorders with onset usually occurring in childhood and adolescence: Secondary | ICD-10-CM | POA: Diagnosis not present

## 2022-07-07 MED ORDER — ATOMOXETINE HCL 25 MG PO CAPS
25.0000 mg | ORAL_CAPSULE | Freq: Every day | ORAL | 3 refills | Status: DC
  Filled 2022-07-07: qty 30, 30d supply, fill #0
  Filled 2022-09-02: qty 30, 30d supply, fill #1

## 2022-07-07 NOTE — Progress Notes (Signed)
Subjective:    Patient ID: [name], female    DOB: 1971/02/05, 51 y.o.   MRN: [id number]  Patient recently had to go to the hospital with another vasovagal attack.  She woke up feeling nauseated.  She went to the bathroom and was vomiting.  She stood up because she had to have a diarrhea.  At that point she felt faint and passed out.  She struck her face against the tub fracturing her nasal bridge and also suffering a fracture to the maxilla.  She required surgery on her mouth for this.  This all occurred at the end of July.  She is currently seeing cardiology.  She has a history of diastolic dysfunction for which she is on Entresto.  She also has nonobstructive coronary artery disease on a recent CT scan showing 20 to 40% blockages in her LAD.  She is currently on a statin for this.  However she is also on Adderall for ADD.  Given her recent cardiovascular issues, I strongly recommended that we stop Adderall.  I do not believe it is in her best interest to take any type of amphetamine.  The patient states that she cannot work without some, medication due to her lack of focus.  We discussed a nonstimulant medication such as Strattera and she is certainly interested in this. Past Medical History:  Diagnosis Date   Anxiety    Asthma    Back pain    Chest pain    Chronic CHF (HCC)    Dyspnea    Echocardiogram 10/25/2015   EF 60-65, normal wall motion, grade 1 diastolic dysfunction, GLS -19.1%   Edema of left lower extremity    Fracture dislocation of elbow joint 01/15/2013   right   GERD (gastroesophageal reflux disease)    Omeprazole as needed, none in 6 mos.   Hyperlipidemia    Hypertension    Joint pain    Obesity    Palpitations    Prediabetes    R/L Cardiac catheterization 08/18/2018   Normal coronary arteries, mean RA 6, mean PA 17, PCWP 10   Seborrheic dermatitis    Sleep apnea    uses CPAP "most nights"   Past Surgical History:  Procedure Laterality Date   CESAREAN  SECTION  1998; 10/10/2000   x 2   KNEE ARTHROSCOPY Left 1991   also in 2019   [place]  11/02/2006   LAPAROSCOPIC LYSIS OF ADHESIONS  03/06/2005   RADIAL HEAD ARTHROPLASTY Right 01/17/2013   Procedure: RIGHT RADIAL HEAD ARTHROPLASTY OPEN REDUCTION INTERNAL FIXATION CORONOID ;  Surgeon: Tennis Must, MD;  Location: [facility];  Service: Orthopedics;  Laterality: Right;   RIGHT/LEFT HEART CATH AND CORONARY ANGIOGRAPHY N/A 08/18/2018   Procedure: RIGHT/LEFT HEART CATH AND CORONARY ANGIOGRAPHY;  Surgeon: Nelva Bush, MD;  Location: [facility];  Service: Cardiovascular;  Laterality: N/A;   TUBAL LIGATION  03/06/2005   Current Outpatient Medications on File Prior to Visit  Medication Sig Dispense Refill   albuterol (PROVENTIL HFA;VENTOLIN HFA) 108 (90 Base) MCG/ACT inhaler Inhale 2 puffs into the lungs every 6 (six) hours as needed for wheezing. 1 Inhaler 11   ALPRAZolam (XANAX) 0.5 MG tablet Take 1 tablet by mouth 3 times daily as needed. 45 tablet 0   amphetamine-dextroamphetamine (ADDERALL XR) 25 MG 24 hr capsule Take 1 capsule by mouth every morning. 30 capsule 0   aspirin EC 81 MG tablet Take 1 tablet (81 mg  total) by mouth daily. Swallow whole.     ibuprofen (ADVIL,MOTRIN) 200 MG tablet Take 400 mg by mouth 2 (two) times daily as needed for headache or moderate pain.     metFORMIN (GLUCOPHAGE) 500 MG tablet Take 1 tablet by mouth in the morning, at noon, and at bedtime. 30 tablet 0   metoprolol tartrate (LOPRESSOR) 50 MG tablet Take 1 tablet (50 mg total) by mouth 2 (two) times daily. 180 tablet 3   nitroGLYCERIN (NITROSTAT) 0.4 MG SL tablet Place 1 tablet (0.4 mg total) under the tongue every 5 (five) minutes as needed for chest pain. 25 tablet 3   ondansetron (ZOFRAN-ODT) 4 MG disintegrating tablet Dissolve 1 tablet (4 mg total) by mouth every 8 (eight) hours as needed for nausea or vomiting. 20 tablet 0   rosuvastatin (CRESTOR) 20 MG  tablet Take 1 tablet  by mouth daily. 90 tablet 3   sacubitril-valsartan (ENTRESTO) 24-26 MG Take 1 tablet by mouth 2 (two) times daily. 60 tablet 6   Semaglutide-Weight Management (WEGOVY) 1.7 MG/0.75ML SOAJ Inject 1.7 mg into the skin once a week. 3 mL 0   spironolactone (ALDACTONE) 25 MG tablet Take 1 tablet (25 mg total) by mouth 3 (three) times a week. 45 tablet 3   Vitamin D, Ergocalciferol, (DRISDOL) 1.25 MG (50000 UNIT) CAPS capsule Take 1 capsule by mouth every 7 days. 4 capsule 0   loperamide (IMODIUM) 2 MG capsule Take 1 capsule (2 mg total) by mouth 4 (four) times daily as needed for diarrhea or loose stools. (Patient not taking: Reported on 07/07/2022) 12 capsule 0   ondansetron (ZOFRAN) 4 MG tablet Take 1 tablet (4 mg total) by mouth every 8 (eight) hours as needed for nausea or vomiting. (Patient not taking: Reported on 07/07/2022) 20 tablet 0   sulfamethoxazole-trimethoprim (BACTRIM DS) 800-160 MG tablet Take 1 tablet by mouth every 12 (twelve) hours. (Patient not taking: Reported on 07/07/2022) 14 tablet 0   No current facility-administered medications on file prior to visit.   Allergies  Allergen Reactions   Adhesive [Tape] Rash   Latex Rash   Social History   Socioeconomic History   Marital status: Married    Spouse name: Janyiah Silveri   Number of children: 2   Years of education: Not on file   Highest education level: Not on file  Occupational History   Occupation: CMA    Employer: [employer]  Tobacco Use   Smoking status: Never    Passive exposure: Past   Smokeless tobacco: Never  Substance and Sexual Activity   Alcohol use: No   Drug use: No   Sexual activity: Not on file  Other Topics Concern   Not on file  Social History Narrative   Not on file   Social Determinants of Health   Financial Resource Strain: Not on file  Food Insecurity: Not on file  Transportation Needs: Not on file  Physical Activity: Not on file  Stress: Not on file  Social  Connections: Not on file  Intimate Partner Violence: Not on file     Review of Systems  All other systems reviewed and are negative.      Objective:   Physical Exam Vitals reviewed.  Constitutional:      Appearance: She is obese.  HENT:     Right Ear: Tympanic membrane and ear canal normal.     Left Ear: Tympanic membrane and ear canal normal.     Nose: Nose normal.  Cardiovascular:  Rate and Rhythm: Normal rate and regular rhythm.     Pulses: Normal pulses.     Heart sounds: Normal heart sounds. No murmur heard.    No friction rub. No gallop.  Pulmonary:     Effort: No accessory muscle usage, prolonged expiration or respiratory distress.     Breath sounds: No stridor. No wheezing, rhonchi or rales.  Chest:     Chest wall: No tenderness.  Abdominal:     General: Bowel sounds are normal. There is no distension.     Palpations: Abdomen is soft.     Tenderness: There is no abdominal tenderness. There is no right CVA tenderness, left CVA tenderness, guarding or rebound.  Musculoskeletal:     Right lower leg: No edema.     Left lower leg: No edema.  Neurological:     Mental Status: She is alert.         Assessment & Plan:  Chronic diastolic CHF (congestive heart failure) (HCC)  Attention deficit disorder (ADD) without hyperactivity  ASCVD (arteriosclerotic cardiovascular disease) Patient has been nauseated ever since her hospitalization.  I believe the patient likely suffered a concussion.  She had a syncopal episode due to a vasovagal event and then struck her face with enough force to fracture portions of the maxilla as well as her nasal bridge.  I believe this certainly cause a concussion and the nausea and dizziness is likely postconcussive syndrome.  Given her syncopal episodes, cardiovascular disease with nonobstructive blockages in LAD, and diastolic dysfunction, I have recommended that we discontinue Adderall.  Instead I will replace it with Strattera 25 mg a  day.  We will uptitrate in 1 month to 40 mg as tolerated.  Avoid stimulants moving forward.

## 2022-07-09 ENCOUNTER — Other Ambulatory Visit (HOSPITAL_COMMUNITY): Payer: Self-pay

## 2022-07-09 ENCOUNTER — Other Ambulatory Visit: Payer: Self-pay | Admitting: Family Medicine

## 2022-07-09 MED ORDER — ONDANSETRON HCL 4 MG PO TABS
4.0000 mg | ORAL_TABLET | Freq: Three times a day (TID) | ORAL | 0 refills | Status: DC | PRN
  Filled 2022-07-09: qty 20, 7d supply, fill #0

## 2022-07-10 ENCOUNTER — Other Ambulatory Visit (HOSPITAL_COMMUNITY): Payer: Self-pay

## 2022-07-13 ENCOUNTER — Encounter: Payer: Self-pay | Admitting: Physician Assistant

## 2022-07-13 ENCOUNTER — Other Ambulatory Visit (HOSPITAL_COMMUNITY): Payer: Self-pay

## 2022-07-13 ENCOUNTER — Ambulatory Visit (INDEPENDENT_AMBULATORY_CARE_PROVIDER_SITE_OTHER): Payer: 59 | Admitting: Physician Assistant

## 2022-07-13 ENCOUNTER — Ambulatory Visit (INDEPENDENT_AMBULATORY_CARE_PROVIDER_SITE_OTHER): Payer: 59

## 2022-07-13 DIAGNOSIS — M5440 Lumbago with sciatica, unspecified side: Secondary | ICD-10-CM

## 2022-07-13 DIAGNOSIS — M545 Low back pain, unspecified: Secondary | ICD-10-CM | POA: Insufficient documentation

## 2022-07-13 MED ORDER — CYCLOBENZAPRINE HCL 10 MG PO TABS
10.0000 mg | ORAL_TABLET | Freq: Three times a day (TID) | ORAL | 0 refills | Status: DC | PRN
  Filled 2022-07-13: qty 30, 10d supply, fill #0

## 2022-07-13 MED ORDER — METHYLPREDNISOLONE 4 MG PO TBPK
ORAL_TABLET | ORAL | 0 refills | Status: DC
  Filled 2022-07-13: qty 21, 6d supply, fill #0

## 2022-07-13 NOTE — Progress Notes (Signed)
Office Visit Note   Patient: [name]           Date of Birth: 09/05/1971           MRN: [id number] Visit Date: 07/13/2022              Requested by: Susy Frizzle, MD [address] [place],  [place] [address] PCP: Susy Frizzle, MD      HPI: Patient is a pleasant 51 year old woman with a chief complaint of low right-sided back pain.  She has never had difficulty with her back in the past.  She did sustain a fall a few weeks ago at home.  At the time she did not notice any back pain.  She now has pain that is awakening her at night and is causing her some stiffness during the day.  She is tried some anti-inflammatories and Robaxin.  She denies any paresthesias any loss of bowel or bladder control.  Assessment & Plan: Visit Diagnoses:  1. Acute bilateral low back pain with sciatica, sciatica laterality unspecified   2. Acute right-sided low back pain with sciatica, sciatica laterality unspecified     Plan: Low back pain without any radicular findings.  There is no evidence on x-ray of any fracture.  We will try her on a Depo-Medrol Dosepak which she is taken in the past and not had any difficulty with.  We will also call her in some Flexeril she knows not to take this during the day.  We will follow-up in a few weeks to see how she is doing if she has persistence of symptoms I recommend an MRI  Follow-Up Instructions: No follow-ups on file.   Ortho Exam  Patient is alert, oriented, no adenopathy, well-dressed, normal affect, normal respiratory effort. Examination of her low back she does have some stiffness with an antalgic gait.  This is better when she progressively moves.  She has 5 out of 5 strength in her lower extremities with resisted dorsiflexion plantarflexion of her ankles and legs resisted flexion of her hips.  She does have some reproducible pain with extension of her back and bending to the right.  Sensation is intact she can move her feet without  difficulty  Imaging: XR Lumbar Spine 2-3 Views  Result Date: 07/13/2022 2 view radiographs of her lumbar spine were reviewed today.  She has well-preserved joint spaces no listhesis.  No evidence of any fractures.  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  Labs: Lab Results  Component Value Date   HGBA1C 5.5 05/28/2022   HGBA1C 5.6 10/23/2021   HGBA1C 5.5 05/08/2021     Lab Results  Component Value Date   ALBUMIN 4.2 06/04/2022   ALBUMIN 4.3 05/28/2022   ALBUMIN 4.1 10/23/2021    No results found for: "MG" Lab Results  Component Value Date   VD25OH 59.4 05/28/2022   VD25OH 39.1 10/23/2021   VD25OH 39.3 05/08/2021    No results found for: "PREALBUMIN"    Latest Ref Rng & Units 06/04/2022    3:09 AM 01/23/2022    7:31 PM 05/07/2021    3:15 PM  CBC EXTENDED  WBC 4.0 - 10.5 K/uL 15.3  11.8  11.3   RBC 3.87 - 5.11 MIL/uL 5.07  5.11  5.29   Hemoglobin 12.0 - 15.0 g/dL 14.9  14.6  15.3   HCT 36.0 - 46.0 % 44.6  44.7  45.6  Platelets 150 - 400 K/uL 301  402  409      There is no height or weight on file to calculate BMI.  Orders:  Orders Placed This Encounter  Procedures   XR Lumbar Spine 2-3 Views   Meds ordered this encounter  Medications   methylPREDNISolone (MEDROL DOSEPAK) 4 MG TBPK tablet    Sig: Take as directed    Dispense:  21 tablet    Refill:  0   cyclobenzaprine (FLEXERIL) 10 MG tablet    Sig: Take 1 tablet (10 mg total) by mouth 3 (three) times daily as needed for muscle spasms.    Dispense:  30 tablet    Refill:  0     Procedures: No procedures performed  Clinical Data: No additional findings.  ROS:  All other systems negative, except as noted in the HPI. Review of Systems  Objective: Vital Signs: There were no vitals taken for this visit.  Specialty Comments:  No specialty comments available.  PMFS History: Patient Active Problem List   Diagnosis Date Noted   Low back pain  07/13/2022   Dehydration 06/25/2022   At risk for dehydration 04/27/2022   Chest tightness 01/30/2022   Palpitations 10/03/2020   At risk for diabetes mellitus 08/12/2020   Asthma 05/21/2020   H/O: hysterectomy 05/21/2020   Prediabetes 12/29/2018   Essential hypertension 09/02/2018   Atypical chest pain 08/18/2018   Shortness of breath 08/18/2018   ADD (attention deficit disorder) 12/29/2017   Chronic diastolic CHF (congestive heart failure) ([facility]) 08/24/2016   Sinus tachycardia (HCC) 75/64/3329   Diastolic dysfunction 51/88/4166   PVC (premature ventricular contraction) 12/17/2015   Family history of premature coronary artery disease 03/14/2015   GERD (gastroesophageal reflux disease) 05/02/2014   Anal fissure 05/02/2014   Leg swelling 05/07/2013   Obesity, unspecified 05/07/2013   Spasm of muscle 12/05/2010   Pain in thoracic spine 12/05/2010   Acute upper respiratory infection 12/05/2010   Vitamin D deficiency 08/20/2010   Other abnormal glucose 08/20/2010   Hypothyroidism 08/20/2010   Depressive disorder, not elsewhere classified 07/31/2010   Anxiety state 07/31/2010   Seborrheic dermatitis, unspecified 09/18/2008   Past Medical History:  Diagnosis Date   Anxiety    Asthma    Back pain    Chest pain    Chronic CHF ([facility])    Dyspnea    Echocardiogram 10/25/2015   EF 60-65, normal wall motion, grade 1 diastolic dysfunction, GLS -19.1%   Edema of left lower extremity    Fracture dislocation of elbow joint 01/15/2013   right   GERD (gastroesophageal reflux disease)    Omeprazole as needed, none in 6 mos.   Hyperlipidemia    Hypertension    Joint pain    Obesity    Palpitations    Prediabetes    R/L Cardiac catheterization 08/18/2018   Normal coronary arteries, mean RA 6, mean PA 17, PCWP 10   Seborrheic dermatitis    Sleep apnea    uses CPAP "most nights"    Family History  Problem Relation Age of Onset   Diabetes Sister    Heart disease Father 36        MI--1st Dx CAD. Died2that time   Sudden death Father    Diabetes Mother    Hypertension Mother    Heart disease Mother    Obesity Mother    Heart disease Maternal Uncle    Ovarian cancer Maternal Grandmother    Diabetes Maternal Grandmother  Heart disease Maternal Grandmother        Great Grandmother   Diabetes Paternal Grandmother    Liver disease Paternal Aunt    Heart disease Maternal Grandfather     Past Surgical History:  Procedure Laterality Date   CESAREAN SECTION  1998; 10/10/2000   x 2   KNEE ARTHROSCOPY Left 1991   also in 2019   LAPAROSCOPIC ASSISTED VAGINAL HYSTERECTOMY  11/02/2006   LAPAROSCOPIC LYSIS OF ADHESIONS  03/06/2005   RADIAL HEAD ARTHROPLASTY Right 01/17/2013   Procedure: RIGHT RADIAL HEAD ARTHROPLASTY OPEN REDUCTION INTERNAL FIXATION CORONOID ;  Surgeon: Tennis Must, MD;  Location: [facility];  Service: Orthopedics;  Laterality: Right;   RIGHT/LEFT HEART CATH AND CORONARY ANGIOGRAPHY N/A 08/18/2018   Procedure: RIGHT/LEFT HEART CATH AND CORONARY ANGIOGRAPHY;  Surgeon: Nelva Bush, MD;  Location: [facility];  Service: Cardiovascular;  Laterality: N/A;   TUBAL LIGATION  03/06/2005   Social History   Occupational History   Occupation: CMA    Employer: [employer]  Tobacco Use   Smoking status: Never    Passive exposure: Past   Smokeless tobacco: Never  Substance and Sexual Activity   Alcohol use: No   Drug use: No   Sexual activity: Not on file

## 2022-07-23 DIAGNOSIS — Z7982 Long term (current) use of aspirin: Secondary | ICD-10-CM | POA: Diagnosis not present

## 2022-07-23 DIAGNOSIS — N390 Urinary tract infection, site not specified: Secondary | ICD-10-CM | POA: Diagnosis not present

## 2022-07-23 DIAGNOSIS — M549 Dorsalgia, unspecified: Secondary | ICD-10-CM | POA: Diagnosis not present

## 2022-07-23 DIAGNOSIS — R109 Unspecified abdominal pain: Secondary | ICD-10-CM | POA: Diagnosis not present

## 2022-07-23 DIAGNOSIS — Z79899 Other long term (current) drug therapy: Secondary | ICD-10-CM | POA: Diagnosis not present

## 2022-07-23 DIAGNOSIS — M545 Low back pain, unspecified: Secondary | ICD-10-CM | POA: Diagnosis not present

## 2022-07-23 DIAGNOSIS — F909 Attention-deficit hyperactivity disorder, unspecified type: Secondary | ICD-10-CM | POA: Diagnosis not present

## 2022-07-23 DIAGNOSIS — R748 Abnormal levels of other serum enzymes: Secondary | ICD-10-CM | POA: Diagnosis not present

## 2022-07-23 DIAGNOSIS — R000 Tachycardia, unspecified: Secondary | ICD-10-CM | POA: Diagnosis not present

## 2022-07-23 DIAGNOSIS — E7800 Pure hypercholesterolemia, unspecified: Secondary | ICD-10-CM | POA: Diagnosis not present

## 2022-07-23 DIAGNOSIS — I959 Hypotension, unspecified: Secondary | ICD-10-CM | POA: Diagnosis not present

## 2022-07-23 DIAGNOSIS — R0902 Hypoxemia: Secondary | ICD-10-CM | POA: Diagnosis not present

## 2022-07-23 DIAGNOSIS — G4730 Sleep apnea, unspecified: Secondary | ICD-10-CM | POA: Diagnosis not present

## 2022-07-23 DIAGNOSIS — I110 Hypertensive heart disease with heart failure: Secondary | ICD-10-CM | POA: Diagnosis not present

## 2022-07-23 DIAGNOSIS — F900 Attention-deficit hyperactivity disorder, predominantly inattentive type: Secondary | ICD-10-CM | POA: Diagnosis not present

## 2022-07-23 DIAGNOSIS — N200 Calculus of kidney: Secondary | ICD-10-CM | POA: Diagnosis not present

## 2022-07-23 DIAGNOSIS — N132 Hydronephrosis with renal and ureteral calculous obstruction: Secondary | ICD-10-CM | POA: Diagnosis not present

## 2022-07-23 DIAGNOSIS — N12 Tubulo-interstitial nephritis, not specified as acute or chronic: Secondary | ICD-10-CM | POA: Diagnosis not present

## 2022-07-23 DIAGNOSIS — Z6835 Body mass index (BMI) 35.0-35.9, adult: Secondary | ICD-10-CM | POA: Diagnosis not present

## 2022-07-23 DIAGNOSIS — I509 Heart failure, unspecified: Secondary | ICD-10-CM | POA: Diagnosis not present

## 2022-07-23 DIAGNOSIS — N201 Calculus of ureter: Secondary | ICD-10-CM | POA: Diagnosis not present

## 2022-07-23 LAB — LAB REPORT - SCANNED: EGFR (Non-African Amer.): 81

## 2022-07-24 DIAGNOSIS — N200 Calculus of kidney: Secondary | ICD-10-CM | POA: Diagnosis not present

## 2022-07-24 DIAGNOSIS — E7800 Pure hypercholesterolemia, unspecified: Secondary | ICD-10-CM | POA: Diagnosis not present

## 2022-07-24 DIAGNOSIS — Z7982 Long term (current) use of aspirin: Secondary | ICD-10-CM | POA: Diagnosis not present

## 2022-07-24 DIAGNOSIS — F909 Attention-deficit hyperactivity disorder, unspecified type: Secondary | ICD-10-CM | POA: Diagnosis not present

## 2022-07-24 DIAGNOSIS — G4730 Sleep apnea, unspecified: Secondary | ICD-10-CM | POA: Diagnosis not present

## 2022-07-24 DIAGNOSIS — I509 Heart failure, unspecified: Secondary | ICD-10-CM | POA: Diagnosis not present

## 2022-07-24 DIAGNOSIS — N132 Hydronephrosis with renal and ureteral calculous obstruction: Secondary | ICD-10-CM | POA: Diagnosis not present

## 2022-07-24 DIAGNOSIS — I110 Hypertensive heart disease with heart failure: Secondary | ICD-10-CM | POA: Diagnosis not present

## 2022-07-27 ENCOUNTER — Encounter: Payer: Self-pay | Admitting: Family Medicine

## 2022-07-27 ENCOUNTER — Ambulatory Visit: Payer: 59 | Admitting: Family Medicine

## 2022-07-27 ENCOUNTER — Encounter (INDEPENDENT_AMBULATORY_CARE_PROVIDER_SITE_OTHER): Payer: Self-pay | Admitting: Family Medicine

## 2022-07-27 ENCOUNTER — Encounter (INDEPENDENT_AMBULATORY_CARE_PROVIDER_SITE_OTHER): Payer: Self-pay | Admitting: *Deleted

## 2022-07-27 ENCOUNTER — Ambulatory Visit (INDEPENDENT_AMBULATORY_CARE_PROVIDER_SITE_OTHER): Payer: 59 | Admitting: Family Medicine

## 2022-07-27 VITALS — BP 124/83 | HR 101 | Temp 98.1°F | Ht 63.0 in | Wt 199.0 lb

## 2022-07-27 DIAGNOSIS — R748 Abnormal levels of other serum enzymes: Secondary | ICD-10-CM | POA: Diagnosis not present

## 2022-07-27 DIAGNOSIS — Z6835 Body mass index (BMI) 35.0-35.9, adult: Secondary | ICD-10-CM

## 2022-07-27 DIAGNOSIS — E669 Obesity, unspecified: Secondary | ICD-10-CM

## 2022-07-27 DIAGNOSIS — N200 Calculus of kidney: Secondary | ICD-10-CM | POA: Diagnosis not present

## 2022-07-27 DIAGNOSIS — Z6841 Body Mass Index (BMI) 40.0 and over, adult: Secondary | ICD-10-CM

## 2022-07-28 LAB — CMP14+EGFR
ALT: 42 IU/L — ABNORMAL HIGH (ref 0–32)
AST: 29 IU/L (ref 0–40)
Albumin/Globulin Ratio: 1.4 (ref 1.2–2.2)
Albumin: 3.7 g/dL — ABNORMAL LOW (ref 3.9–4.9)
Alkaline Phosphatase: 113 IU/L (ref 44–121)
BUN/Creatinine Ratio: 14 (ref 9–23)
BUN: 10 mg/dL (ref 6–24)
Bilirubin Total: <0.2 mg/dL (ref 0.0–1.2)
CO2: 22 mmol/L (ref 20–29)
Calcium: 9.3 mg/dL (ref 8.7–10.2)
Chloride: 100 mmol/L (ref 96–106)
Creatinine, Ser: 0.73 mg/dL (ref 0.57–1.00)
Globulin, Total: 2.6 g/dL (ref 1.5–4.5)
Glucose: 124 mg/dL — ABNORMAL HIGH (ref 70–99)
Potassium: 3.7 mmol/L (ref 3.5–5.2)
Sodium: 141 mmol/L (ref 134–144)
Total Protein: 6.3 g/dL (ref 6.0–8.5)
eGFR: 101 mL/min/{1.73_m2} (ref >59)

## 2022-07-28 LAB — AMYLASE: Amylase: 38 U/L (ref 31–110)

## 2022-07-28 LAB — LIPASE: Lipase: 30 U/L (ref 14–72)

## 2022-07-29 ENCOUNTER — Other Ambulatory Visit (HOSPITAL_COMMUNITY): Payer: Self-pay

## 2022-07-29 ENCOUNTER — Ambulatory Visit (HOSPITAL_COMMUNITY)
Admission: RE | Admit: 2022-07-29 | Discharge: 2022-07-29 | Disposition: A | Discharge status: Home / Self Care | Payer: 59 | Source: Ambulatory Visit | Attending: Urology | Admitting: Urology

## 2022-07-29 ENCOUNTER — Ambulatory Visit: Payer: 59 | Admitting: Urology

## 2022-07-29 ENCOUNTER — Encounter: Payer: Self-pay | Admitting: Urology

## 2022-07-29 VITALS — BP 112/75 | HR 83 | Ht 62.5 in | Wt 200.0 lb

## 2022-07-29 DIAGNOSIS — Z87442 Personal history of urinary calculi: Secondary | ICD-10-CM | POA: Diagnosis not present

## 2022-07-29 DIAGNOSIS — N200 Calculus of kidney: Secondary | ICD-10-CM

## 2022-07-29 DIAGNOSIS — Z466 Encounter for fitting and adjustment of urinary device: Secondary | ICD-10-CM | POA: Diagnosis not present

## 2022-07-29 DIAGNOSIS — Z960 Presence of urogenital implants: Secondary | ICD-10-CM | POA: Diagnosis not present

## 2022-07-29 LAB — URINALYSIS, ROUTINE W REFLEX MICROSCOPIC
Bilirubin, UA: NEGATIVE
Glucose, UA: NEGATIVE
Ketones, UA: NEGATIVE
Nitrite, UA: NEGATIVE
Specific Gravity, UA: 1.01 (ref 1.005–1.030)
Urobilinogen, Ur: 0.2 mg/dL (ref 0.2–1.0)
pH, UA: 6 (ref 5.0–7.5)

## 2022-07-29 LAB — MICROSCOPIC EXAMINATION: Renal Epithel, UA: NONE SEEN /hpf

## 2022-07-29 MED ORDER — PHENAZOPYRIDINE HCL 200 MG PO TABS
200.0000 mg | ORAL_TABLET | Freq: Three times a day (TID) | ORAL | 1 refills | Status: DC | PRN
  Filled 2022-07-29: qty 20, 7d supply, fill #0

## 2022-07-29 MED ORDER — MIRABEGRON ER 25 MG PO TB24: 25.0000 mg | ORAL_TABLET | Freq: Every day | ORAL | 0 refills | Status: DC

## 2022-07-29 NOTE — Patient Instructions (Signed)
Dear Ms. Rosita Fire,   Thank you for choosing [facility] to assist in your urologic care for your upcoming surgery. The following information below includes specific dates and details related to surgery:   The Surgical Procedure you are scheduled to have performed is right extracorporeal shock wave lithotripsy    Surgery Date: 08/04/2022   Physician performing the surgery: Dr. Nicolette Bang   Do not eat or drink after midnight the day before your surgery.    You will need a driver the day of surgery and will not be able to operate heavy machinery for 24 hours after.    Your surgery will be performed at  [facility] [address], [place] [address]   Enter at the Main Entrance and check in at the [place] desk.     Pre-Admit Testing Info   Pre- Admit appointments are interview with an anesthesiologist or a pre-operative anesthesia nurse. These appointments are typically completed as an in person visit but can take place over the telephone.  You will be contacted to confirm the date and time window.    Post op- Please go by [facility] prior to coming for your post op appointment to have your xray completed.   If you have any questions or concerns, please don't hesitate to call the office at [phone]   Thank you,    Gibson Ramp, RN Clinical Surgery Coordinator [facility]

## 2022-07-29 NOTE — Progress Notes (Signed)
Assessment: 1. Nephrolithiasis     Plan: I reviewed the records from the patient's ER visit and op notes from [place]. I personally reviewed the KUB from today.  The right ureteral stent is in good position.  There is bowel gas overlying the right kidney making visualization difficult.  However, there appears to be a calcification adjacent to the proximal portion of the stent. Options for management of the right renal calculus discussed with the patient including shockwave lithotripsy, ureteroscopic laser lithotripsy, and PCNL.  Risk and benefits of each treatment discussed. She would like to proceed with shockwave lithotripsy. Continue right ureteral stent. Continue Flomax. Will add Pyridium 200 mg 3 times daily as needed. Samples of Myrbetriq 25 mg daily provided. Begin Macrobid 100 mg daily for UTI prevention   Procedure: The patient will be scheduled for right ESL at [facility].  Surgical request is placed with the surgery schedulers and will be scheduled at the patient's/family request. Informed consent is given as documented below. Anesthesia:  IV sedation  The patient does have sleep apnea, history of MRSA, history of VRE, history of cardiac device requiring special anesthetic needs. Patient is stable and considered clear for surgical in an outpatient ambulatory surgery setting as well as patient hospital setting.  Consent for Operation or Procedure: Provider Certification I hereby certify that the nature, purpose, benefits, usual and most frequent risks of, and alternatives to, the operation or procedure have been explained to the patient (or person authorized to sign for the patient) either by me as responsible physician or by the provider who is to perform the operation or procedure. Time spent such that the patient/family has had an opportunity to ask questions, and that those questions have been answered. The patient or the patient's representative has been advised that  selected tasks may be performed by assistants to the primary health care provider(s). I believe that the patient (or person authorized to sign for the patient) understands what has been explained, and has consented to the operation or procedure. No guarantees were implied or made.   Chief Complaint:  Chief Complaint  Patient presents with   Nephrolithiasis    History of Present Illness:  [name] is a 51 y.o. female who is seen in consultation from Susy Frizzle, MD for evaluation of nephrolithiasis.  She had recent onset of right upper quadrant and right-sided back pain with associated nausea.  No fevers or chills.  She was seen in the emergency room in [place].  CT imaging showed a 9 x 7 mm calculus at the right UPJ with mild hydronephrosis.  She was diagnosed with a possible UTI.  She underwent cystoscopy with insertion of a right ureteral stent on 07/24/2022.  Urine culture showed no growth. She continues to have right sided back pain.  She does have stent related symptoms including frequency and dysuria.  She is no longer taking antibiotics due to side effects of a yeast infection.  No recent fevers or chills.  No gross hematuria.  She is on tamsulosin.   Past Medical History:  Past Medical History:  Diagnosis Date   Anxiety    Asthma    Back pain    Chest pain    Chronic CHF ([place])    Dyspnea    Echocardiogram 10/25/2015   EF 60-65, normal wall motion, grade 1 diastolic dysfunction, GLS -19.1%   Edema of left lower extremity    Fracture dislocation of elbow joint 01/15/2013   right  GERD (gastroesophageal reflux disease)    Omeprazole as needed, none in 6 mos.   Hyperlipidemia    Hypertension    Joint pain    Obesity    Palpitations    Prediabetes    R/L Cardiac catheterization 08/18/2018   Normal coronary arteries, mean RA 6, mean PA 17, PCWP 10   Seborrheic dermatitis    Sleep apnea    uses CPAP "most nights"    Past Surgical History:  Past Surgical  History:  Procedure Laterality Date   CESAREAN SECTION  1998; 10/10/2000   x 2   KNEE ARTHROSCOPY Left 1991   also in 2019   [place]  11/02/2006   LAPAROSCOPIC LYSIS OF ADHESIONS  03/06/2005   RADIAL HEAD ARTHROPLASTY Right 01/17/2013   Procedure: RIGHT RADIAL HEAD ARTHROPLASTY OPEN REDUCTION INTERNAL FIXATION CORONOID ;  Surgeon: Tennis Must, MD;  Location: [facility];  Service: Orthopedics;  Laterality: Right;   RIGHT/LEFT HEART CATH AND CORONARY ANGIOGRAPHY N/A 08/18/2018   Procedure: RIGHT/LEFT HEART CATH AND CORONARY ANGIOGRAPHY;  Surgeon: Nelva Bush, MD;  Location: [facility];  Service: Cardiovascular;  Laterality: N/A;   TUBAL LIGATION  03/06/2005    Allergies:  Allergies  Allergen Reactions   Adhesive [Tape] Rash   Latex Rash    Family History:  Family History  Problem Relation Age of Onset   Diabetes Sister    Heart disease Father 36       MI--1st Dx CAD. Died2that time   Sudden death Father    Diabetes Mother    Hypertension Mother    Heart disease Mother    Obesity Mother    Heart disease Maternal Uncle    Ovarian cancer Maternal Grandmother    Diabetes Maternal Grandmother    Heart disease Maternal Grandmother        Great Grandmother   Diabetes Paternal Grandmother    Liver disease Paternal Aunt    Heart disease Maternal Grandfather     Social History:  Social History   Tobacco Use   Smoking status: Never    Passive exposure: Past   Smokeless tobacco: Never  Substance Use Topics   Alcohol use: No   Drug use: No    Review of symptoms:  Constitutional:  Negative for unexplained weight loss, night sweats, fever, chills ENT:  Negative for nose bleeds, sinus pain, painful swallowing CV:  Negative for chest pain, shortness of breath, exercise intolerance, palpitations, loss of consciousness Resp:  Negative for cough, wheezing, shortness of breath GI:  Negative for nausea, vomiting,  diarrhea, bloody stools GU:  Positives noted in HPI; otherwise negative for gross hematuria, dysuria, urinary incontinence Neuro:  Negative for seizures, poor balance, limb weakness, slurred speech Psych:  Negative for lack of energy, depression, anxiety Endocrine:  Negative for polydipsia, polyuria, symptoms of hypoglycemia (dizziness, hunger, sweating) Hematologic:  Negative for anemia, purpura, petechia, prolonged or excessive bleeding, use of anticoagulants  Allergic:  Negative for difficulty breathing or choking as a result of exposure to anything; no shellfish allergy; no allergic response (rash/itch) to materials, foods  Physical exam: BP 112/75   Pulse 83   Ht 5' 2.5" (1.588 m)   Wt 200 lb (90.7 kg)   BMI 36.00 kg/m  GENERAL APPEARANCE:  Well appearing, well developed, well nourished, NAD HEENT: Atraumatic, Normocephalic, oropharynx clear. NECK: Supple without lymphadenopathy or thyromegaly. LUNGS: Clear to auscultation bilaterally. HEART: Regular Rate and Rhythm without murmurs, gallops, or rubs. ABDOMEN: Soft,  non-tender, No Masses. EXTREMITIES: Moves all extremities well.  Without clubbing, cyanosis, or edema. NEUROLOGIC:  Alert and oriented x 3, normal gait, CN II-XII grossly intact.  MENTAL STATUS:  Appropriate. BACK:  Non-tender to palpation.  No CVAT SKIN:  Warm, dry and intact.    Results: U/A:  11-30 WBC, 11-30 RBC, few bacteria

## 2022-07-29 NOTE — H&P (View-Only) (Signed)
Assessment: 1. Nephrolithiasis     Plan: I reviewed the records from the patient's ER visit and op notes from [place]. I personally reviewed the KUB from today.  The right ureteral stent is in good position.  There is bowel gas overlying the right kidney making visualization difficult.  However, there appears to be a calcification adjacent to the proximal portion of the stent. Options for management of the right renal calculus discussed with the patient including shockwave lithotripsy, ureteroscopic laser lithotripsy, and PCNL.  Risk and benefits of each treatment discussed. She would like to proceed with shockwave lithotripsy. Continue right ureteral stent. Continue Flomax. Will add Pyridium 200 mg 3 times daily as needed. Samples of Myrbetriq 25 mg daily provided. Begin Macrobid 100 mg daily for UTI prevention   Procedure: The patient will be scheduled for right ESL at [facility].  Surgical request is placed with the surgery schedulers and will be scheduled at the patient's/family request. Informed consent is given as documented below. Anesthesia:  IV sedation  The patient does have sleep apnea, history of MRSA, history of VRE, history of cardiac device requiring special anesthetic needs. Patient is stable and considered clear for surgical in an outpatient ambulatory surgery setting as well as patient hospital setting.  Consent for Operation or Procedure: Provider Certification I hereby certify that the nature, purpose, benefits, usual and most frequent risks of, and alternatives to, the operation or procedure have been explained to the patient (or person authorized to sign for the patient) either by me as responsible physician or by the provider who is to perform the operation or procedure. Time spent such that the patient/family has had an opportunity to ask questions, and that those questions have been answered. The patient or the patient's representative has been advised that  selected tasks may be performed by assistants to the primary health care provider(s). I believe that the patient (or person authorized to sign for the patient) understands what has been explained, and has consented to the operation or procedure. No guarantees were implied or made.   Chief Complaint:  Chief Complaint  Patient presents with   Nephrolithiasis    History of Present Illness:  [name] is a 51 y.o. female who is seen in consultation from Susy Frizzle, MD for evaluation of nephrolithiasis.  She had recent onset of right upper quadrant and right-sided back pain with associated nausea.  No fevers or chills.  She was seen in the emergency room in [place].  CT imaging showed a 9 x 7 mm calculus at the right UPJ with mild hydronephrosis.  She was diagnosed with a possible UTI.  She underwent cystoscopy with insertion of a right ureteral stent on 07/24/2022.  Urine culture showed no growth. She continues to have right sided back pain.  She does have stent related symptoms including frequency and dysuria.  She is no longer taking antibiotics due to side effects of a yeast infection.  No recent fevers or chills.  No gross hematuria.  She is on tamsulosin.   Past Medical History:  Past Medical History:  Diagnosis Date   Anxiety    Asthma    Back pain    Chest pain    Chronic CHF ([place])    Dyspnea    Echocardiogram 10/25/2015   EF 60-65, normal wall motion, grade 1 diastolic dysfunction, GLS -19.1%   Edema of left lower extremity    Fracture dislocation of elbow joint 01/15/2013   right  GERD (gastroesophageal reflux disease)    Omeprazole as needed, none in 6 mos.   Hyperlipidemia    Hypertension    Joint pain    Obesity    Palpitations    Prediabetes    R/L Cardiac catheterization 08/18/2018   Normal coronary arteries, mean RA 6, mean PA 17, PCWP 10   Seborrheic dermatitis    Sleep apnea    uses CPAP "most nights"    Past Surgical History:  Past Surgical  History:  Procedure Laterality Date   CESAREAN SECTION  1998; 10/10/2000   x 2   KNEE ARTHROSCOPY Left 1991   also in 2019   [place]  11/02/2006   LAPAROSCOPIC LYSIS OF ADHESIONS  03/06/2005   RADIAL HEAD ARTHROPLASTY Right 01/17/2013   Procedure: RIGHT RADIAL HEAD ARTHROPLASTY OPEN REDUCTION INTERNAL FIXATION CORONOID ;  Surgeon: Tennis Must, MD;  Location: [facility];  Service: Orthopedics;  Laterality: Right;   RIGHT/LEFT HEART CATH AND CORONARY ANGIOGRAPHY N/A 08/18/2018   Procedure: RIGHT/LEFT HEART CATH AND CORONARY ANGIOGRAPHY;  Surgeon: Nelva Bush, MD;  Location: [facility] [facility];  Service: Cardiovascular;  Laterality: N/A;   TUBAL LIGATION  03/06/2005    Allergies:  Allergies  Allergen Reactions   Adhesive [Tape] Rash   Latex Rash    Family History:  Family History  Problem Relation Age of Onset   Diabetes Sister    Heart disease Father 3       MI--1st Dx CAD. Died2that time   Sudden death Father    Diabetes Mother    Hypertension Mother    Heart disease Mother    Obesity Mother    Heart disease Maternal Uncle    Ovarian cancer Maternal Grandmother    Diabetes Maternal Grandmother    Heart disease Maternal Grandmother        Great Grandmother   Diabetes Paternal Grandmother    Liver disease Paternal Aunt    Heart disease Maternal Grandfather     Social History:  Social History   Tobacco Use   Smoking status: Never    Passive exposure: Past   Smokeless tobacco: Never  Substance Use Topics   Alcohol use: No   Drug use: No    Review of symptoms:  Constitutional:  Negative for unexplained weight loss, night sweats, fever, chills ENT:  Negative for nose bleeds, sinus pain, painful swallowing CV:  Negative for chest pain, shortness of breath, exercise intolerance, palpitations, loss of consciousness Resp:  Negative for cough, wheezing, shortness of breath GI:  Negative for nausea, vomiting,  diarrhea, bloody stools GU:  Positives noted in HPI; otherwise negative for gross hematuria, dysuria, urinary incontinence Neuro:  Negative for seizures, poor balance, limb weakness, slurred speech Psych:  Negative for lack of energy, depression, anxiety Endocrine:  Negative for polydipsia, polyuria, symptoms of hypoglycemia (dizziness, hunger, sweating) Hematologic:  Negative for anemia, purpura, petechia, prolonged or excessive bleeding, use of anticoagulants  Allergic:  Negative for difficulty breathing or choking as a result of exposure to anything; no shellfish allergy; no allergic response (rash/itch) to materials, foods  Physical exam: BP 112/75   Pulse 83   Ht 5' 2.5" (1.588 m)   Wt 200 lb (90.7 kg)   BMI 36.00 kg/m  GENERAL APPEARANCE:  Well appearing, well developed, well nourished, NAD HEENT: Atraumatic, Normocephalic, oropharynx clear. NECK: Supple without lymphadenopathy or thyromegaly. LUNGS: Clear to auscultation bilaterally. HEART: Regular Rate and Rhythm without murmurs, gallops, or rubs. ABDOMEN: Soft,  non-tender, No Masses. EXTREMITIES: Moves all extremities well.  Without clubbing, cyanosis, or edema. NEUROLOGIC:  Alert and oriented x 3, normal gait, CN II-XII grossly intact.  MENTAL STATUS:  Appropriate. BACK:  Non-tender to palpation.  No CVAT SKIN:  Warm, dry and intact.    Results: U/A:  11-30 WBC, 11-30 RBC, few bacteria

## 2022-07-31 ENCOUNTER — Encounter (HOSPITAL_COMMUNITY)
Admission: RE | Admit: 2022-07-31 | Discharge: 2022-07-31 | Disposition: A | Discharge status: Home / Self Care | Payer: 59 | Source: Ambulatory Visit | Attending: Urology | Admitting: Urology

## 2022-07-31 NOTE — Progress Notes (Unsigned)
Chief Complaint:   OBESITY [name] is here to discuss her progress with her obesity treatment plan along with follow-up of her obesity related diagnoses. [name] is on practicing portion control and making smarter food choices, such as increasing vegetables and decreasing simple carbohydrates and states she is following her eating plan approximately 50% of the time. [name] states she is doing 0 minutes 0 times per week.  Today's visit was #: 67 Starting weight: 235 lbs Starting date: 11/30/2018 Today's weight: 199 lbs Today's date: 07/27/2022 Total lbs lost to date: 36 Total lbs lost since last in-office visit: 0  Interim History: [name] has not been able to concentrate on weight loss with her recent health issues.  Subjective:   1. Abnormal serum level of lipase [name] had a mildly elevated lipase done when she went to the ER with significant nephrolithiasis and stent placement.  She had some abdominal pain.   2. Nephrolithiasis [name] was diagnosed with right nephrolithiasis with proximal hydronephrosis status post stent and perinephritis stranding.  This was while on vacation in [place] and went to [place] for evaluation and to see the Urologist  Assessment/Plan:   1. Abnormal serum level of lipase We will recheck labs today, and [name] is to hold [facility] for now.  - Lipase - Amylase - CMP14+EGFR  2. Nephrolithiasis We will refer [name] to Urology urgently for further evaluation within 1 to 2 weeks.  If not, she may have to go to the ER or back to [name].  - Ambulatory referral to Urology  3. Obesity, Current BMI 35.3 [name] is currently in the action stage of change. As such, her goal is to continue with weight loss efforts. She has agreed to practicing portion control and making smarter food choices, such as increasing vegetables and decreasing simple carbohydrates until she feel better.   Behavioral modification strategies: increasing water  intake.  [name] has agreed to follow-up with our clinic in 3 to 4 weeks. She was informed of the importance of frequent follow-up visits to maximize her success with intensive lifestyle modifications for her multiple health conditions.   Objective:   Blood pressure 124/83, pulse (!) 101, temperature 98.1 F (36.7 C), height $RemoveBe'5\' 3"'dORcOIMlc$  (1.6 m), weight 199 lb (90.3 kg), SpO2 97 %. Body mass index is 35.25 kg/m.  General: Cooperative, alert, well developed, in no acute distress. HEENT: Conjunctivae and lids unremarkable. Cardiovascular: Regular rhythm.  Lungs: Normal work of breathing. Neurologic: No focal deficits.   Lab Results  Component Value Date   CREATININE 0.73 07/27/2022   BUN 10 07/27/2022   NA 141 07/27/2022   K 3.7 07/27/2022   CL 100 07/27/2022   CO2 22 07/27/2022   Lab Results  Component Value Date   ALT 42 (H) 07/27/2022   AST 29 07/27/2022   ALKPHOS 113 07/27/2022   BILITOT <0.2 07/27/2022   Lab Results  Component Value Date   HGBA1C 5.5 05/28/2022   HGBA1C 5.6 10/23/2021   HGBA1C 5.5 05/08/2021   HGBA1C 5.6 05/29/2020   HGBA1C 5.4 11/27/2019   Lab Results  Component Value Date   INSULIN 12.5 05/28/2022   INSULIN 9.3 10/23/2021   INSULIN 22.8 05/08/2021   INSULIN 15.2 05/29/2020   INSULIN 10.3 11/27/2019   Lab Results  Component Value Date   TSH 3.820 05/07/2021   Lab Results  Component Value Date   CHOL 122 05/08/2022   HDL 41 05/08/2022   LDLCALC 64 05/08/2022   TRIG 89 05/08/2022  CHOLHDL 3.0 05/08/2022   Lab Results  Component Value Date   VD25OH 59.4 05/28/2022   VD25OH 39.1 10/23/2021   VD25OH 39.3 05/08/2021   Lab Results  Component Value Date   WBC 15.3 (H) 06/04/2022   HGB 14.9 06/04/2022   HCT 44.6 06/04/2022   MCV 88.0 06/04/2022   PLT 301 06/04/2022   No results found for: "IRON", "TIBC", "FERRITIN"  Attestation Statements:   Reviewed by clinician on day of visit: allergies, medications, problem list, medical  history, surgical history, family history, social history, and previous encounter notes.   I, Trixie Dredge, am acting as transcriptionist for Dennard Nip, MD.  I have reviewed the above documentation for accuracy and completeness, and I agree with the above. -  Dennard Nip, MD

## 2022-08-04 ENCOUNTER — Other Ambulatory Visit (HOSPITAL_COMMUNITY): Payer: Self-pay

## 2022-08-04 ENCOUNTER — Encounter (HOSPITAL_COMMUNITY)
Admission: RE | Disposition: A | Discharge status: Home / Self Care | Payer: Self-pay | Source: Home / Self Care | Attending: Urology

## 2022-08-04 ENCOUNTER — Ambulatory Visit (HOSPITAL_COMMUNITY)
Admission: RE | Admit: 2022-08-04 | Discharge: 2022-08-04 | Disposition: A | Discharge status: Home / Self Care | Payer: 59 | Attending: Urology | Admitting: Urology

## 2022-08-04 ENCOUNTER — Encounter (HOSPITAL_COMMUNITY): Payer: Self-pay | Admitting: Urology

## 2022-08-04 ENCOUNTER — Ambulatory Visit (HOSPITAL_COMMUNITY): Payer: 59

## 2022-08-04 DIAGNOSIS — Z86000 Personal history of in-situ neoplasm of breast: Secondary | ICD-10-CM | POA: Insufficient documentation

## 2022-08-04 DIAGNOSIS — Z6835 Body mass index (BMI) 35.0-35.9, adult: Secondary | ICD-10-CM | POA: Insufficient documentation

## 2022-08-04 DIAGNOSIS — N200 Calculus of kidney: Secondary | ICD-10-CM | POA: Insufficient documentation

## 2022-08-04 DIAGNOSIS — Z8614 Personal history of Methicillin resistant Staphylococcus aureus infection: Secondary | ICD-10-CM | POA: Diagnosis not present

## 2022-08-04 DIAGNOSIS — Z8619 Personal history of other infectious and parasitic diseases: Secondary | ICD-10-CM | POA: Insufficient documentation

## 2022-08-04 DIAGNOSIS — Z01818 Encounter for other preprocedural examination: Secondary | ICD-10-CM | POA: Diagnosis not present

## 2022-08-04 DIAGNOSIS — E669 Obesity, unspecified: Secondary | ICD-10-CM | POA: Insufficient documentation

## 2022-08-04 DIAGNOSIS — G4730 Sleep apnea, unspecified: Secondary | ICD-10-CM | POA: Diagnosis not present

## 2022-08-04 HISTORY — PX: EXTRACORPOREAL SHOCK WAVE LITHOTRIPSY: SHX1557

## 2022-08-04 SURGERY — LITHOTRIPSY, ESWL
Anesthesia: LOCAL | Laterality: Right

## 2022-08-04 MED ORDER — OXYCODONE-ACETAMINOPHEN 5-325 MG PO TABS
1.0000 | ORAL_TABLET | ORAL | 0 refills | Status: DC | PRN
  Filled 2022-08-04: qty 30, 5d supply, fill #0

## 2022-08-04 MED ORDER — ONDANSETRON 4 MG PO TBDP
4.0000 mg | ORAL_TABLET | Freq: Three times a day (TID) | ORAL | 0 refills | Status: DC | PRN
  Filled 2022-08-04: qty 30, 10d supply, fill #0

## 2022-08-04 MED ORDER — SODIUM CHLORIDE 0.9 % IV SOLN
INTRAVENOUS | Status: DC
  Administered 2022-08-04: 1000 mL via INTRAVENOUS

## 2022-08-04 MED ORDER — DIAZEPAM 5 MG PO TABS
10.0000 mg | ORAL_TABLET | Freq: Once | ORAL | Status: AC
  Administered 2022-08-04: 10 mg via ORAL
  Filled 2022-08-04: qty 2

## 2022-08-04 MED ORDER — TAMSULOSIN HCL 0.4 MG PO CAPS
0.4000 mg | ORAL_CAPSULE | Freq: Every day | ORAL | 1 refills | Status: DC
  Filled 2022-08-04: qty 30, 30d supply, fill #0

## 2022-08-04 MED ORDER — DIPHENHYDRAMINE HCL 25 MG PO CAPS
25.0000 mg | ORAL_CAPSULE | ORAL | Status: AC
  Administered 2022-08-04: 25 mg via ORAL
  Filled 2022-08-04: qty 1

## 2022-08-04 NOTE — Interval H&P Note (Signed)
History and Physical Interval Note:  08/04/2022 12:37 PM  [name]  has presented today for surgery, with the diagnosis of right renal calculus.  The various methods of treatment have been discussed with the patient and family. After consideration of risks, benefits and other options for treatment, the patient has consented to  Procedure(s): EXTRACORPOREAL SHOCK WAVE LITHOTRIPSY (ESWL) (Right) as a surgical intervention.  The patient's history has been reviewed, patient examined, no change in status, stable for surgery.  I have reviewed the patient's chart and labs.  Questions were answered to the patient's satisfaction.     Nicolette Bang

## 2022-08-05 ENCOUNTER — Other Ambulatory Visit (HOSPITAL_COMMUNITY): Payer: Self-pay

## 2022-08-07 ENCOUNTER — Encounter (HOSPITAL_COMMUNITY): Payer: Self-pay | Admitting: Urology

## 2022-08-12 ENCOUNTER — Ambulatory Visit: Payer: 59 | Admitting: Urology

## 2022-08-12 ENCOUNTER — Other Ambulatory Visit: Payer: Self-pay

## 2022-08-12 ENCOUNTER — Ambulatory Visit (HOSPITAL_COMMUNITY)
Admission: RE | Admit: 2022-08-12 | Discharge: 2022-08-12 | Disposition: A | Discharge status: Home / Self Care | Payer: 59 | Source: Ambulatory Visit | Attending: Urology | Admitting: Urology

## 2022-08-12 ENCOUNTER — Other Ambulatory Visit: Payer: Self-pay | Admitting: Urology

## 2022-08-12 ENCOUNTER — Encounter: Payer: Self-pay | Admitting: Urology

## 2022-08-12 VITALS — BP 111/76 | HR 87 | Ht 63.0 in | Wt 200.0 lb

## 2022-08-12 DIAGNOSIS — N200 Calculus of kidney: Secondary | ICD-10-CM

## 2022-08-12 DIAGNOSIS — Z466 Encounter for fitting and adjustment of urinary device: Secondary | ICD-10-CM | POA: Diagnosis not present

## 2022-08-12 MED ORDER — CIPROFLOXACIN HCL 500 MG PO TABS
500.0000 mg | ORAL_TABLET | Freq: Once | ORAL | Status: AC
  Administered 2022-08-12: 500 mg via ORAL

## 2022-08-12 NOTE — Progress Notes (Signed)
Assessment: 1. Nephrolithiasis     Plan: I personally reviewed the KUB study from today which shows a right ureteral stent in good position without obvious calcification noted on the right and a 6 mm calcification in the upper left renal shadow. Right ureteral stent removed today. Cipro x1 following stent removal. Return to office in 10-14 days with previsit KUB.   Chief Complaint:  Chief Complaint  Patient presents with   Nephrolithiasis    History of Present Illness:  [name] is a 51 y.o. female who is seen for further evaluation of nephrolithiasis.  She had recent onset of right upper quadrant and right-sided back pain with associated nausea.  No fevers or chills.  She was seen in the emergency room in [place].  CT imaging showed a 9 x 7 mm calculus at the right UPJ with mild hydronephrosis.  She was diagnosed with a possible UTI.  She underwent cystoscopy with insertion of a right ureteral stent on 07/24/2022.  Urine culture showed no growth. She underwent right ESL on 08/04/2022.  Portions of the above documentation were copied from a prior visit for review purposes only.   Past Medical History:  Past Medical History:  Diagnosis Date   Anxiety    Asthma    Back pain    Chest pain    Chronic CHF ([place])    Dyspnea    Echocardiogram 10/25/2015   EF 60-65, normal wall motion, grade 1 diastolic dysfunction, GLS -19.1%   Edema of left lower extremity    Fracture dislocation of elbow joint 01/15/2013   right   GERD (gastroesophageal reflux disease)    Omeprazole as needed, none in 6 mos.   Hyperlipidemia    Hypertension    Joint pain    Obesity    Palpitations    Prediabetes    R/L Cardiac catheterization 08/18/2018   Normal coronary arteries, mean RA 6, mean PA 17, PCWP 10   Seborrheic dermatitis    Sleep apnea    uses CPAP "most nights"    Past Surgical History:  Past Surgical History:  Procedure Laterality Date   CESAREAN SECTION  1998; 10/10/2000    x 2   EXTRACORPOREAL SHOCK WAVE LITHOTRIPSY Right 08/04/2022   Procedure: EXTRACORPOREAL SHOCK WAVE LITHOTRIPSY (ESWL);  Surgeon: Cleon Gustin, MD;  Location: [facility];  Service: Urology;  Laterality: Right;   KNEE ARTHROSCOPY Left 1991   also in 2019   LAPAROSCOPIC ASSISTED VAGINAL HYSTERECTOMY  11/02/2006   LAPAROSCOPIC LYSIS OF ADHESIONS  03/06/2005   RADIAL HEAD ARTHROPLASTY Right 01/17/2013   Procedure: RIGHT RADIAL HEAD ARTHROPLASTY OPEN REDUCTION INTERNAL FIXATION CORONOID ;  Surgeon: Tennis Must, MD;  Location: [facility];  Service: Orthopedics;  Laterality: Right;   RIGHT/LEFT HEART CATH AND CORONARY ANGIOGRAPHY N/A 08/18/2018   Procedure: RIGHT/LEFT HEART CATH AND CORONARY ANGIOGRAPHY;  Surgeon: Nelva Bush, MD;  Location: [facility];  Service: Cardiovascular;  Laterality: N/A;   TUBAL LIGATION  03/06/2005    Allergies:  Allergies  Allergen Reactions   Adhesive [Tape] Rash   Latex Rash    Family History:  Family History  Problem Relation Age of Onset   Diabetes Sister    Heart disease Father 40       MI--1st Dx CAD. Died2that time   Sudden death Father    Diabetes Mother    Hypertension Mother    Heart disease Mother    Obesity Mother    Heart disease  Maternal Uncle    Ovarian cancer Maternal Grandmother    Diabetes Maternal Grandmother    Heart disease Maternal Grandmother        Great Grandmother   Diabetes Paternal Grandmother    Liver disease Paternal Aunt    Heart disease Maternal Grandfather     Social History:  Social History   Tobacco Use   Smoking status: Never    Passive exposure: Past   Smokeless tobacco: Never  Substance Use Topics   Alcohol use: No   Drug use: No    ROS: Constitutional:  Negative for fever, chills, weight loss CV: Negative for chest pain, previous MI, hypertension Respiratory:  Negative for shortness of breath, wheezing, sleep apnea, frequent cough GI:  Negative for nausea, vomiting,  bloody stool, GERD  Physical exam: BP 111/76   Pulse 87   Ht '5\' 3"'$  (1.6 m)   Wt 200 lb (90.7 kg)   BMI 35.43 kg/m  GENERAL APPEARANCE:  Well appearing, well developed, well nourished, NAD HEENT:  Atraumatic, normocephalic, oropharynx clear NECK:  Supple without lymphadenopathy or thyromegaly ABDOMEN:  Soft, non-tender, no masses EXTREMITIES:  Moves all extremities well, without clubbing, cyanosis, or edema NEUROLOGIC:  Alert and oriented x 3, normal gait, CN II-XII grossly intact MENTAL STATUS:  appropriate BACK:  Non-tender to palpation, No CVAT SKIN:  Warm, dry, and intact  Results: U/A:  6-10 WBC, 3-10 RBC  Procedure:  Flexible Cystourethroscopy/Stent removal  Pre-operative Diagnosis:  Nephrolithiasis  Post-operative Diagnosis:  Nephrolithiasis  Anesthesia:  local with lidocaine jelly  Surgical Narrative:  After appropriate informed consent was obtained, the patient was prepped and draped in the usual sterile fashion in the supine position.  The patient was correctly identified and the proper procedure delineated prior to proceeding.  Sterile lidocaine gel was instilled in the urethra. The flexible cystoscope was introduced without difficulty. The right ureteral stent was visualized within the bladder.  Using stent graspers, the stent was removed without difficulty.  The cystoscope was then removed.  The patient tolerated the procedure well.

## 2022-08-12 NOTE — Addendum Note (Signed)
Addended by: Audie Box on: 08/12/2022 03:36 PM   Modules accepted: Orders

## 2022-08-13 LAB — URINALYSIS, ROUTINE W REFLEX MICROSCOPIC
Bilirubin, UA: NEGATIVE
Glucose, UA: NEGATIVE
Ketones, UA: NEGATIVE
Nitrite, UA: NEGATIVE
Specific Gravity, UA: 1.01 (ref 1.005–1.030)
Urobilinogen, Ur: 0.2 mg/dL (ref 0.2–1.0)
pH, UA: 5.5 (ref 5.0–7.5)

## 2022-08-13 LAB — MICROSCOPIC EXAMINATION: Bacteria, UA: NONE SEEN

## 2022-08-18 ENCOUNTER — Ambulatory Visit: Payer: 59 | Admitting: Urology

## 2022-08-18 ENCOUNTER — Ambulatory Visit (INDEPENDENT_AMBULATORY_CARE_PROVIDER_SITE_OTHER): Payer: 59 | Admitting: Family Medicine

## 2022-08-18 ENCOUNTER — Encounter (INDEPENDENT_AMBULATORY_CARE_PROVIDER_SITE_OTHER): Payer: Self-pay | Admitting: Family Medicine

## 2022-08-18 VITALS — BP 112/70 | HR 71 | Temp 97.9°F | Ht 63.0 in | Wt 204.0 lb

## 2022-08-18 DIAGNOSIS — E559 Vitamin D deficiency, unspecified: Secondary | ICD-10-CM | POA: Diagnosis not present

## 2022-08-18 DIAGNOSIS — R7303 Prediabetes: Secondary | ICD-10-CM

## 2022-08-18 DIAGNOSIS — E669 Obesity, unspecified: Secondary | ICD-10-CM

## 2022-08-18 DIAGNOSIS — Z6836 Body mass index (BMI) 36.0-36.9, adult: Secondary | ICD-10-CM

## 2022-08-18 MED ORDER — METFORMIN HCL 500 MG PO TABS
500.0000 mg | ORAL_TABLET | Freq: Three times a day (TID) | ORAL | 0 refills | Status: DC
  Filled 2022-08-18: qty 30, 10d supply, fill #0

## 2022-08-18 MED ORDER — VITAMIN D (ERGOCALCIFEROL) 1.25 MG (50000 UNIT) PO CAPS
50000.0000 [IU] | ORAL_CAPSULE | ORAL | 0 refills | Status: DC
  Filled 2022-08-18: qty 4, 28d supply, fill #0

## 2022-08-19 ENCOUNTER — Other Ambulatory Visit (HOSPITAL_COMMUNITY): Payer: Self-pay

## 2022-08-25 ENCOUNTER — Ambulatory Visit (INDEPENDENT_AMBULATORY_CARE_PROVIDER_SITE_OTHER): Payer: 59 | Admitting: Urology

## 2022-08-25 ENCOUNTER — Ambulatory Visit (HOSPITAL_COMMUNITY)
Admission: RE | Admit: 2022-08-25 | Discharge: 2022-08-25 | Disposition: A | Discharge status: Home / Self Care | Payer: 59 | Source: Ambulatory Visit | Attending: Urology | Admitting: Urology

## 2022-08-25 ENCOUNTER — Encounter: Payer: Self-pay | Admitting: Urology

## 2022-08-25 VITALS — BP 120/78 | HR 76

## 2022-08-25 DIAGNOSIS — N200 Calculus of kidney: Secondary | ICD-10-CM | POA: Diagnosis not present

## 2022-08-25 DIAGNOSIS — Z87442 Personal history of urinary calculi: Secondary | ICD-10-CM

## 2022-08-25 NOTE — Progress Notes (Signed)
Chief Complaint:   OBESITY [name] is here to discuss her progress with her obesity treatment plan along with follow-up of her obesity related diagnoses. [name] is on practicing portion control and making smarter food choices, such as increasing vegetables and decreasing simple carbohydrates and states she is following her eating plan approximately 50-60% of the time. [name] states she is doing 0 minutes 0 times per week.  Today's visit was #: 30 Starting weight: 235 lbs Starting date: 11/30/2018 Today's weight: 204 lbs Today's date: 08/18/2022 Total lbs lost to date: 31 Total lbs lost since last in-office visit: 0  Interim History: [name] has done well overall with her weight loss. She is still dealing with kidney stones, and she had a stent removed, and lithotripsy but she still has not passed stones. She feels like she is retaining some fluid. She is trying to keep hydrated. She notes increased hunger with this recent stress, and she has only been off steroids over the past couple of days.   Subjective:   1. Pre-diabetes [name] is taking metformin 500 mg daily.   2. Vitamin D deficiency [name]'s last Vitamin D level was 59.4. She has resumed Vitamin D 50,000 IU weekly.   Assessment/Plan:   1. Pre-diabetes We will refill metformin for 1 month. [name] will continue her diet, exercise, and decreasing simple carbohydrates to help decrease the risk of diabetes.   - metFORMIN (GLUCOPHAGE) 500 MG tablet; Take 1 tablet by mouth in the morning, at noon, and at bedtime.  Dispense: 30 tablet; Refill: 0  2. Vitamin D deficiency We will refill prescription Vitamin D for 1 month. [name] will follow-up for routine testing of Vitamin D, at least 2-3 times per year to avoid over-replacement.  - Vitamin D, Ergocalciferol, (DRISDOL) 1.25 MG (50000 UNIT) CAPS capsule; Take 1 capsule by mouth every 7 days.  Dispense: 4 capsule; Refill: 0  3. Obesity, Current BMI 36.2 [name] is  currently in the action stage of change. As such, her goal is to continue with weight loss efforts. She has agreed to practicing portion control and making smarter food choices, such as increasing vegetables and decreasing simple carbohydrates.   Exercise goals: As is.   Behavioral modification strategies: increasing lean protein intake, decreasing simple carbohydrates, and increasing water intake.  [name] has agreed to follow-up with our clinic in 3 to 4 weeks. She was informed of the importance of frequent follow-up visits to maximize her success with intensive lifestyle modifications for her multiple health conditions.   Objective:   Blood pressure 112/70, pulse 71, temperature 97.9 F (36.6 C), height '5\' 3"'$  (1.6 m), weight 204 lb (92.5 kg), SpO2 96 %. Body mass index is 36.14 kg/m.  General: Cooperative, alert, well developed, in no acute distress. HEENT: Conjunctivae and lids unremarkable. Cardiovascular: Regular rhythm.  Lungs: Normal work of breathing. Neurologic: No focal deficits.   Lab Results  Component Value Date   CREATININE 0.73 07/27/2022   BUN 10 07/27/2022   NA 141 07/27/2022   K 3.7 07/27/2022   CL 100 07/27/2022   CO2 22 07/27/2022   Lab Results  Component Value Date   ALT 42 (H) 07/27/2022   AST 29 07/27/2022   ALKPHOS 113 07/27/2022   BILITOT <0.2 07/27/2022   Lab Results  Component Value Date   HGBA1C 5.5 05/28/2022   HGBA1C 5.6 10/23/2021   HGBA1C 5.5 05/08/2021   HGBA1C 5.6 05/29/2020   HGBA1C 5.4 11/27/2019   Lab Results  Component Value  Date   INSULIN 12.5 05/28/2022   INSULIN 9.3 10/23/2021   INSULIN 22.8 05/08/2021   INSULIN 15.2 05/29/2020   INSULIN 10.3 11/27/2019   Lab Results  Component Value Date   TSH 3.820 05/07/2021   Lab Results  Component Value Date   CHOL 122 05/08/2022   HDL 41 05/08/2022   LDLCALC 64 05/08/2022   TRIG 89 05/08/2022   CHOLHDL 3.0 05/08/2022   Lab Results  Component Value Date   VD25OH 59.4  05/28/2022   VD25OH 39.1 10/23/2021   VD25OH 39.3 05/08/2021   Lab Results  Component Value Date   WBC 15.3 (H) 06/04/2022   HGB 14.9 06/04/2022   HCT 44.6 06/04/2022   MCV 88.0 06/04/2022   PLT 301 06/04/2022   No results found for: "IRON", "TIBC", "FERRITIN"  Attestation Statements:   Reviewed by clinician on day of visit: allergies, medications, problem list, medical history, surgical history, family history, social history, and previous encounter notes.   I, Trixie Dredge, am acting as transcriptionist for Dennard Nip, MD.  I have reviewed the above documentation for accuracy and completeness, and I agree with the above. -  Dennard Nip, MD

## 2022-08-25 NOTE — Progress Notes (Signed)
Assessment: 1. Nephrolithiasis     Plan: I personally reviewed the KUB study from today.  No obvious calcifications are seen over the area of the right renal shadow or along the expected course of the right ureter; however, visualization is limited due to overlying bowel gas. Continue stone prevention Return to office in 3 weeks with KUB  Chief Complaint:  Chief Complaint  Patient presents with   Nephrolithiasis    History of Present Illness:  [name] is a 51 y.o. female who is seen for further evaluation of nephrolithiasis.  She had recent onset of right upper quadrant and right-sided back pain with associated nausea.  No fevers or chills.  She was seen in the emergency room in [place].  CT imaging showed a 9 x 7 mm calculus at the right UPJ with mild hydronephrosis.  She was diagnosed with a possible UTI.  She underwent cystoscopy with insertion of a right ureteral stent on 07/24/2022.  Urine culture showed no growth. She underwent right ESL on 08/04/2022. Her right ureteral stent was removed on 08/12/22. KUB from 08/14/2022 showed the right ureteral stent in good position, no obvious right renal calculus, and stable left nephrolithiasis.  She returns today for follow-up.  She reports passing very small pieces of gravel.  She has not had any right-sided flank pain.  She has had some low back pain.  No dysuria or gross hematuria.  Portions of the above documentation were copied from a prior visit for review purposes only.   Past Medical History:  Past Medical History:  Diagnosis Date   Anxiety    Asthma    Back pain    Chest pain    Chronic CHF ([place])    Dyspnea    Echocardiogram 10/25/2015   EF 60-65, normal wall motion, grade 1 diastolic dysfunction, GLS -19.1%   Edema of left lower extremity    Fracture dislocation of elbow joint 01/15/2013   right   GERD (gastroesophageal reflux disease)    Omeprazole as needed, none in 6 mos.   Hyperlipidemia     Hypertension    Joint pain    Obesity    Palpitations    Prediabetes    R/L Cardiac catheterization 08/18/2018   Normal coronary arteries, mean RA 6, mean PA 17, PCWP 10   Seborrheic dermatitis    Sleep apnea    uses CPAP "most nights"    Past Surgical History:  Past Surgical History:  Procedure Laterality Date   CESAREAN SECTION  1998; 10/10/2000   x 2   EXTRACORPOREAL SHOCK WAVE LITHOTRIPSY Right 08/04/2022   Procedure: EXTRACORPOREAL SHOCK WAVE LITHOTRIPSY (ESWL);  Surgeon: Cleon Gustin, MD;  Location: [facility];  Service: Urology;  Laterality: Right;   KNEE ARTHROSCOPY Left 1991   also in 2019   LAPAROSCOPIC ASSISTED VAGINAL HYSTERECTOMY  11/02/2006   LAPAROSCOPIC LYSIS OF ADHESIONS  03/06/2005   RADIAL HEAD ARTHROPLASTY Right 01/17/2013   Procedure: RIGHT RADIAL HEAD ARTHROPLASTY OPEN REDUCTION INTERNAL FIXATION CORONOID ;  Surgeon: Tennis Must, MD;  Location: [facility];  Service: Orthopedics;  Laterality: Right;   RIGHT/LEFT HEART CATH AND CORONARY ANGIOGRAPHY N/A 08/18/2018   Procedure: RIGHT/LEFT HEART CATH AND CORONARY ANGIOGRAPHY;  Surgeon: Nelva Bush, MD;  Location: [facility];  Service: Cardiovascular;  Laterality: N/A;   TUBAL LIGATION  03/06/2005    Allergies:  Allergies  Allergen Reactions   Adhesive [Tape] Rash   Latex Rash    Family History:  Family History  Problem Relation Age of Onset   Diabetes Sister    Heart disease Father 85       MI--1st Dx CAD. Died2that time   Sudden death Father    Diabetes Mother    Hypertension Mother    Heart disease Mother    Obesity Mother    Heart disease Maternal Uncle    Ovarian cancer Maternal Grandmother    Diabetes Maternal Grandmother    Heart disease Maternal Grandmother        Great Grandmother   Diabetes Paternal Grandmother    Liver disease Paternal Aunt    Heart disease Maternal Grandfather     Social History:  Social History   Tobacco Use   Smoking status: Never     Passive exposure: Past   Smokeless tobacco: Never  Substance Use Topics   Alcohol use: No   Drug use: No    ROS: Constitutional:  Negative for fever, chills, weight loss CV: Negative for chest pain, previous MI, hypertension Respiratory:  Negative for shortness of breath, wheezing, sleep apnea, frequent cough GI:  Negative for nausea, vomiting, bloody stool, GERD  Physical exam: BP 120/78   Pulse 76  GENERAL APPEARANCE:  Well appearing, well developed, well nourished, NAD HEENT:  Atraumatic, normocephalic, oropharynx clear NECK:  Supple without lymphadenopathy or thyromegaly ABDOMEN:  Soft, non-tender, no masses EXTREMITIES:  Moves all extremities well, without clubbing, cyanosis, or edema NEUROLOGIC:  Alert and oriented x 3, normal gait, CN II-XII grossly intact MENTAL STATUS:  appropriate BACK:  Non-tender to palpation, No CVAT SKIN:  Warm, dry, and intact  Results: U/A:  pH 7.5, 6-10 WBC, 3-10 RBC, few bacteria

## 2022-08-26 LAB — URINALYSIS, ROUTINE W REFLEX MICROSCOPIC
Bilirubin, UA: NEGATIVE
Glucose, UA: NEGATIVE
Ketones, UA: NEGATIVE
Nitrite, UA: NEGATIVE
Specific Gravity, UA: 1.02 (ref 1.005–1.030)
Urobilinogen, Ur: 1 mg/dL (ref 0.2–1.0)
pH, UA: 7.5 (ref 5.0–7.5)

## 2022-08-26 LAB — MICROSCOPIC EXAMINATION

## 2022-09-02 ENCOUNTER — Other Ambulatory Visit (HOSPITAL_COMMUNITY): Payer: Self-pay

## 2022-09-02 ENCOUNTER — Other Ambulatory Visit: Payer: Self-pay | Admitting: Cardiovascular Disease

## 2022-09-02 MED ORDER — METOPROLOL TARTRATE 50 MG PO TABS
50.0000 mg | ORAL_TABLET | Freq: Two times a day (BID) | ORAL | 2 refills | Status: DC
  Filled 2022-09-02: qty 180, 90d supply, fill #0
  Filled 2023-06-24: qty 180, 90d supply, fill #1

## 2022-09-03 ENCOUNTER — Other Ambulatory Visit (HOSPITAL_COMMUNITY): Payer: Self-pay

## 2022-09-15 ENCOUNTER — Encounter: Payer: Self-pay | Admitting: Urology

## 2022-09-15 ENCOUNTER — Ambulatory Visit (HOSPITAL_COMMUNITY)
Admission: RE | Admit: 2022-09-15 | Discharge: 2022-09-15 | Disposition: A | Discharge status: Home / Self Care | Payer: 59 | Source: Ambulatory Visit | Attending: Urology | Admitting: Urology

## 2022-09-15 ENCOUNTER — Ambulatory Visit (INDEPENDENT_AMBULATORY_CARE_PROVIDER_SITE_OTHER): Payer: 59 | Admitting: Urology

## 2022-09-15 VITALS — BP 114/76 | HR 81 | Ht 63.0 in | Wt 204.0 lb

## 2022-09-15 DIAGNOSIS — N200 Calculus of kidney: Secondary | ICD-10-CM

## 2022-09-15 DIAGNOSIS — Z9889 Other specified postprocedural states: Secondary | ICD-10-CM | POA: Diagnosis not present

## 2022-09-15 NOTE — Progress Notes (Signed)
Assessment: 1. Nephrolithiasis     Plan: I personally viewed the KUB study from today.  No definite calcifications were seen over the area of the right renal shadow or along the expected course of the right ureter. Recommend further evaluation with a renal ultrasound given her ongoing symptoms. Continue stone prevention. We will contact her with results of the ultrasound when available.  Chief Complaint:  Chief Complaint  Patient presents with   Nephrolithiasis    History of Present Illness:  [name] is a 51 y.o. female who is seen for further evaluation of nephrolithiasis.  She had onset of right upper quadrant and right-sided back pain with associated nausea.  No fevers or chills.  She was seen in the emergency room in [place].  CT imaging showed a 9 x 7 mm calculus at the right UPJ with mild hydronephrosis.  She was also diagnosed with a possible UTI.  She underwent cystoscopy with insertion of a right ureteral stent on 07/24/2022.  Urine culture showed no growth. She underwent right ESL on 08/04/2022. Her right ureteral stent was removed on 08/12/22. KUB from 08/14/2022 showed the right ureteral stent in good position, no obvious right renal calculus, and stable left nephrolithiasis. KUB from 08/25/2022 showed no obvious calcifications in the area of the right renal shadow or along the expected course of the right ureter. KUB from today shows a 4 mm left renal calculus and no right renal or ureteral calcifications.  She returns today for follow-up.  She has not passed any additional large fragments since her last visit.  She reports some pain in the right low back area.  She is also had an episode of pain in the left low back area.  She reports this does occasionally wake her up at night.  No dysuria or gross hematuria.   Portions of the above documentation were copied from a prior visit for review purposes only.   Past Medical History:  Past Medical History:   Diagnosis Date   Anxiety    Asthma    Back pain    Chest pain    Chronic CHF ([place])    Dyspnea    Echocardiogram 10/25/2015   EF 60-65, normal wall motion, grade 1 diastolic dysfunction, GLS -19.1%   Edema of left lower extremity    Fracture dislocation of elbow joint 01/15/2013   right   GERD (gastroesophageal reflux disease)    Omeprazole as needed, none in 6 mos.   Hyperlipidemia    Hypertension    Joint pain    Obesity    Palpitations    Prediabetes    R/L Cardiac catheterization 08/18/2018   Normal coronary arteries, mean RA 6, mean PA 17, PCWP 10   Seborrheic dermatitis    Sleep apnea    uses CPAP "most nights"    Past Surgical History:  Past Surgical History:  Procedure Laterality Date   CESAREAN SECTION  1998; 10/10/2000   x 2   EXTRACORPOREAL SHOCK WAVE LITHOTRIPSY Right 08/04/2022   Procedure: EXTRACORPOREAL SHOCK WAVE LITHOTRIPSY (ESWL);  Surgeon: Cleon Gustin, MD;  Location: [facility];  Service: Urology;  Laterality: Right;   KNEE ARTHROSCOPY Left 1991   also in 2019   LAPAROSCOPIC ASSISTED VAGINAL HYSTERECTOMY  11/02/2006   LAPAROSCOPIC LYSIS OF ADHESIONS  03/06/2005   RADIAL HEAD ARTHROPLASTY Right 01/17/2013   Procedure: RIGHT RADIAL HEAD ARTHROPLASTY OPEN REDUCTION INTERNAL FIXATION CORONOID ;  Surgeon: Tennis Must, MD;  Location: [facility];  Service: Orthopedics;  Laterality: Right;   RIGHT/LEFT HEART CATH AND CORONARY ANGIOGRAPHY N/A 08/18/2018   Procedure: RIGHT/LEFT HEART CATH AND CORONARY ANGIOGRAPHY;  Surgeon: Nelva Bush, MD;  Location: [facility];  Service: Cardiovascular;  Laterality: N/A;   TUBAL LIGATION  03/06/2005    Allergies:  Allergies  Allergen Reactions   Adhesive [Tape] Rash   Latex Rash    Family History:  Family History  Problem Relation Age of Onset   Diabetes Sister    Heart disease Father 3       MI--1st Dx CAD. Died2that time   Sudden death Father    Diabetes Mother    Hypertension Mother     Heart disease Mother    Obesity Mother    Heart disease Maternal Uncle    Ovarian cancer Maternal Grandmother    Diabetes Maternal Grandmother    Heart disease Maternal Grandmother        Great Grandmother   Diabetes Paternal Grandmother    Liver disease Paternal Aunt    Heart disease Maternal Grandfather     Social History:  Social History   Tobacco Use   Smoking status: Never    Passive exposure: Past   Smokeless tobacco: Never  Substance Use Topics   Alcohol use: No   Drug use: No    ROS: Constitutional:  Negative for fever, chills, weight loss CV: Negative for chest pain, previous MI, hypertension Respiratory:  Negative for shortness of breath, wheezing, sleep apnea, frequent cough GI:  Negative for nausea, vomiting, bloody stool, GERD  Physical exam: BP 114/76   Pulse 81   Ht '5\' 3"'$  (1.6 m)   Wt 204 lb (92.5 kg)   BMI 36.14 kg/m  GENERAL APPEARANCE:  Well appearing, well developed, well nourished, NAD HEENT:  Atraumatic, normocephalic, oropharynx clear NECK:  Supple without lymphadenopathy or thyromegaly ABDOMEN:  Soft, non-tender, no masses EXTREMITIES:  Moves all extremities well, without clubbing, cyanosis, or edema NEUROLOGIC:  Alert and oriented x 3, normal gait, CN II-XII grossly intact MENTAL STATUS:  appropriate BACK: Mildly tender to palpation in right paraspinal area, No CVAT SKIN:  Warm, dry, and intact  Results: U/A: 0-5 WBCs, 0 RBCs, few bacteria

## 2022-09-16 ENCOUNTER — Ambulatory Visit (INDEPENDENT_AMBULATORY_CARE_PROVIDER_SITE_OTHER): Payer: 59 | Admitting: Family Medicine

## 2022-09-16 ENCOUNTER — Other Ambulatory Visit (HOSPITAL_COMMUNITY): Payer: Self-pay

## 2022-09-16 ENCOUNTER — Encounter (INDEPENDENT_AMBULATORY_CARE_PROVIDER_SITE_OTHER): Payer: Self-pay | Admitting: Family Medicine

## 2022-09-16 VITALS — BP 101/68 | HR 72 | Temp 98.1°F | Ht 63.0 in | Wt 205.0 lb

## 2022-09-16 DIAGNOSIS — Z6836 Body mass index (BMI) 36.0-36.9, adult: Secondary | ICD-10-CM | POA: Diagnosis not present

## 2022-09-16 DIAGNOSIS — F908 Attention-deficit hyperactivity disorder, other type: Secondary | ICD-10-CM

## 2022-09-16 DIAGNOSIS — R7303 Prediabetes: Secondary | ICD-10-CM | POA: Diagnosis not present

## 2022-09-16 DIAGNOSIS — Z6841 Body Mass Index (BMI) 40.0 and over, adult: Secondary | ICD-10-CM

## 2022-09-16 DIAGNOSIS — R748 Abnormal levels of other serum enzymes: Secondary | ICD-10-CM | POA: Diagnosis not present

## 2022-09-16 DIAGNOSIS — E559 Vitamin D deficiency, unspecified: Secondary | ICD-10-CM

## 2022-09-16 DIAGNOSIS — E669 Obesity, unspecified: Secondary | ICD-10-CM

## 2022-09-16 DIAGNOSIS — F988 Other specified behavioral and emotional disorders with onset usually occurring in childhood and adolescence: Secondary | ICD-10-CM | POA: Insufficient documentation

## 2022-09-16 LAB — URINALYSIS, ROUTINE W REFLEX MICROSCOPIC
Bilirubin, UA: NEGATIVE
Glucose, UA: NEGATIVE
Leukocytes,UA: NEGATIVE
Nitrite, UA: NEGATIVE
RBC, UA: NEGATIVE
Specific Gravity, UA: 1.015 (ref 1.005–1.030)
Urobilinogen, Ur: 1 mg/dL (ref 0.2–1.0)
pH, UA: 7 (ref 5.0–7.5)

## 2022-09-16 LAB — MICROSCOPIC EXAMINATION: RBC, Urine: NONE SEEN /hpf (ref 0–2)

## 2022-09-16 MED ORDER — VITAMIN D (ERGOCALCIFEROL) 1.25 MG (50000 UNIT) PO CAPS
50000.0000 [IU] | ORAL_CAPSULE | ORAL | 0 refills | Status: DC
  Filled 2022-09-16: qty 4, 28d supply, fill #0

## 2022-09-16 MED ORDER — METFORMIN HCL 500 MG PO TABS
500.0000 mg | ORAL_TABLET | Freq: Three times a day (TID) | ORAL | 0 refills | Status: DC
  Filled 2022-09-16: qty 90, 30d supply, fill #0

## 2022-09-17 ENCOUNTER — Other Ambulatory Visit (HOSPITAL_COMMUNITY): Payer: Self-pay

## 2022-09-28 NOTE — Progress Notes (Unsigned)
Chief Complaint:   OBESITY [name] is here to discuss her progress with her obesity treatment plan along with follow-up of her obesity related diagnoses. [name] is on practicing portion control and making smarter food choices, such as increasing vegetables and decreasing simple carbohydrates and states she is following her eating plan approximately 50% of the time. [name] states she is doing 0 minutes 0 times per week.  Today's visit was #: 55 Starting weight: 235 lbs Starting date: 11/30/2018 Today's weight: 205 lbs Today's date: 09/16/2022 Total lbs lost to date: 30 Total lbs lost since last in-office visit: 0  Interim History: [name] has maintained her weight loss.  She reports increased cravings off GLP-1 since her episode with kidney stones back in September.  Subjective:   1. Vitamin D deficiency [name] is taking vitamin D prescription, with no side effects noted.  2. Pre-diabetes [name] is taking metformin 500 mg twice daily with no side effects noted.  I encouraged the patient to go ahead and increase to 3 times daily dosing.  3. Elevated lipase [name] is interested in getting back on GLP-1.  She has no clear diagnosis of pancreatitis during recent episode with kidney stones.  She has normal amylase and lipase since that time.   4. Attention deficit hyperactivity disorder (ADHD), other type [name] is taking Strattera at a low dose and the plans are to increase her dose if she is tolerating it well, and should have similar effects on decreasing cravings.  Assessment/Plan:   1. Vitamin D deficiency [name] will continue prescription Vitamin D 50,000 IU every week, and we will refill for 1 month. She will follow-up for routine testing of Vitamin D, at least 2-3 times per year to avoid over-replacement.  - Vitamin D, Ergocalciferol, (DRISDOL) 1.25 MG (50000 UNIT) CAPS capsule; Take 1 capsule by mouth every 7 days.  Dispense: 4 capsule; Refill: 0  2.  Pre-diabetes [name] agreed to increase metformin to 500 mg 3 times daily, and we will refill for 1 month.  She will continue with her diet and exercise.  - metFORMIN (GLUCOPHAGE) 500 MG tablet; Take 1 tablet (500 mg total) by mouth in the morning, at noon, and at bedtime.  Dispense: 90 tablet; Refill: 0  3. Elevated lipase We will continue to monitor off GLP-1 for now.   4. Attention deficit hyperactivity disorder (ADHD), other type [name] is to discuss this with her PCP and if she agrees, then plan to increase Strattera to 40 mg.  5. Obesity, Current BMI 36.4 [name] is currently in the action stage of change. As such, her goal is to continue with weight loss efforts. She has agreed to the Category 3 Plan and practicing portion control and making smarter food choices, such as increasing vegetables and decreasing simple carbohydrates.   Exercise goals: All adults should avoid inactivity. Some physical activity is better than none, and adults who participate in any amount of physical activity gain some health benefits.  Behavioral modification strategies: increasing lean protein intake, decreasing simple carbohydrates, emotional eating strategies, and holiday eating strategies .  [name] has agreed to follow-up with our clinic in 4 weeks. She was informed of the importance of frequent follow-up visits to maximize her success with intensive lifestyle modifications for her multiple health conditions.   Objective:   Blood pressure 101/68, pulse 72, temperature 98.1 F (36.7 C), height '5\' 3"'$  (1.6 m), weight 205 lb (93 kg), SpO2 98 %. Body mass index is 36.31 kg/m.  General: Cooperative,  alert, well developed, in no acute distress. HEENT: Conjunctivae and lids unremarkable. Cardiovascular: Regular rhythm.  Lungs: Normal work of breathing. Neurologic: No focal deficits.   Lab Results  Component Value Date   CREATININE 0.73 07/27/2022   BUN 10 07/27/2022   NA 141 07/27/2022   K 3.7  07/27/2022   CL 100 07/27/2022   CO2 22 07/27/2022   Lab Results  Component Value Date   ALT 42 (H) 07/27/2022   AST 29 07/27/2022   ALKPHOS 113 07/27/2022   BILITOT <0.2 07/27/2022   Lab Results  Component Value Date   HGBA1C 5.5 05/28/2022   HGBA1C 5.6 10/23/2021   HGBA1C 5.5 05/08/2021   HGBA1C 5.6 05/29/2020   HGBA1C 5.4 11/27/2019   Lab Results  Component Value Date   INSULIN 12.5 05/28/2022   INSULIN 9.3 10/23/2021   INSULIN 22.8 05/08/2021   INSULIN 15.2 05/29/2020   INSULIN 10.3 11/27/2019   Lab Results  Component Value Date   TSH 3.820 05/07/2021   Lab Results  Component Value Date   CHOL 122 05/08/2022   HDL 41 05/08/2022   LDLCALC 64 05/08/2022   TRIG 89 05/08/2022   CHOLHDL 3.0 05/08/2022   Lab Results  Component Value Date   VD25OH 59.4 05/28/2022   VD25OH 39.1 10/23/2021   VD25OH 39.3 05/08/2021   Lab Results  Component Value Date   WBC 15.3 (H) 06/04/2022   HGB 14.9 06/04/2022   HCT 44.6 06/04/2022   MCV 88.0 06/04/2022   PLT 301 06/04/2022   No results found for: "IRON", "TIBC", "FERRITIN"  Attestation Statements:   Reviewed by clinician on day of visit: allergies, medications, problem list, medical history, surgical history, family history, social history, and previous encounter notes.   I, Trixie Dredge, am acting as transcriptionist for Dennard Nip, MD.  I have reviewed the above documentation for accuracy and completeness, and I agree with the above. -  Dennard Nip, MD

## 2022-10-13 DIAGNOSIS — R87622 Low grade squamous intraepithelial lesion on cytologic smear of vagina (LGSIL): Secondary | ICD-10-CM | POA: Diagnosis not present

## 2022-10-13 DIAGNOSIS — N890 Mild vaginal dysplasia: Secondary | ICD-10-CM | POA: Diagnosis not present

## 2022-10-13 DIAGNOSIS — Z1272 Encounter for screening for malignant neoplasm of vagina: Secondary | ICD-10-CM | POA: Diagnosis not present

## 2022-10-16 ENCOUNTER — Encounter: Payer: Self-pay | Admitting: Family Medicine

## 2022-10-16 ENCOUNTER — Telehealth: Payer: Self-pay

## 2022-10-16 ENCOUNTER — Other Ambulatory Visit: Payer: Self-pay | Admitting: Family Medicine

## 2022-10-16 ENCOUNTER — Other Ambulatory Visit (INDEPENDENT_AMBULATORY_CARE_PROVIDER_SITE_OTHER): Payer: Self-pay | Admitting: Family Medicine

## 2022-10-16 ENCOUNTER — Other Ambulatory Visit: Payer: Self-pay | Admitting: Urology

## 2022-10-16 ENCOUNTER — Other Ambulatory Visit (HOSPITAL_COMMUNITY): Payer: Self-pay

## 2022-10-16 DIAGNOSIS — E559 Vitamin D deficiency, unspecified: Secondary | ICD-10-CM

## 2022-10-16 MED ORDER — ATOMOXETINE HCL 40 MG PO CAPS
40.0000 mg | ORAL_CAPSULE | Freq: Every day | ORAL | 3 refills | Status: DC
  Filled 2022-10-16: qty 30, 30d supply, fill #0
  Filled 2022-12-16: qty 30, 30d supply, fill #1
  Filled 2023-03-02: qty 30, 30d supply, fill #2
  Filled 2023-04-20: qty 30, 30d supply, fill #3

## 2022-10-16 NOTE — Telephone Encounter (Signed)
MY CHART MESSAGE FROM PATIENT:  Good morning! I am getting low on the staterra and the last time I was in office you wanted me to take for 39 days and then let you know how it done and then you will up my dosage. The dose I'm on now I feel isn't helping me as good and I gave it more time. Can I get a higher dose? Thanks   Pt is aware you are out of the office until Monday.

## 2022-10-19 ENCOUNTER — Other Ambulatory Visit (HOSPITAL_COMMUNITY): Payer: Self-pay

## 2022-10-19 MED ORDER — ONDANSETRON 4 MG PO TBDP
4.0000 mg | ORAL_TABLET | Freq: Three times a day (TID) | ORAL | 0 refills | Status: DC | PRN
  Filled 2022-10-19: qty 30, 10d supply, fill #0

## 2022-10-21 ENCOUNTER — Ambulatory Visit (INDEPENDENT_AMBULATORY_CARE_PROVIDER_SITE_OTHER): Payer: 59 | Admitting: Family Medicine

## 2022-10-22 ENCOUNTER — Other Ambulatory Visit (HOSPITAL_COMMUNITY): Payer: Self-pay

## 2022-10-22 MED ORDER — METRONIDAZOLE 500 MG PO TABS
500.0000 mg | ORAL_TABLET | Freq: Two times a day (BID) | ORAL | 0 refills | Status: DC
  Filled 2022-10-22: qty 14, 7d supply, fill #0

## 2022-10-31 ENCOUNTER — Other Ambulatory Visit (HOSPITAL_COMMUNITY): Payer: Self-pay

## 2022-11-06 ENCOUNTER — Other Ambulatory Visit (HOSPITAL_COMMUNITY): Payer: Self-pay

## 2022-11-13 ENCOUNTER — Encounter (HOSPITAL_BASED_OUTPATIENT_CLINIC_OR_DEPARTMENT_OTHER): Payer: Self-pay | Admitting: Emergency Medicine

## 2022-11-13 ENCOUNTER — Emergency Department (HOSPITAL_BASED_OUTPATIENT_CLINIC_OR_DEPARTMENT_OTHER)
Admission: EM | Admit: 2022-11-13 | Discharge: 2022-11-14 | Disposition: A | Discharge status: Home / Self Care | Payer: 59 | Attending: Emergency Medicine | Admitting: Emergency Medicine

## 2022-11-13 ENCOUNTER — Other Ambulatory Visit: Payer: Self-pay

## 2022-11-13 DIAGNOSIS — J45909 Unspecified asthma, uncomplicated: Secondary | ICD-10-CM | POA: Insufficient documentation

## 2022-11-13 DIAGNOSIS — Z91040 Latex allergy status: Secondary | ICD-10-CM | POA: Diagnosis not present

## 2022-11-13 DIAGNOSIS — Z7982 Long term (current) use of aspirin: Secondary | ICD-10-CM | POA: Diagnosis not present

## 2022-11-13 DIAGNOSIS — I110 Hypertensive heart disease with heart failure: Secondary | ICD-10-CM | POA: Diagnosis not present

## 2022-11-13 DIAGNOSIS — N200 Calculus of kidney: Secondary | ICD-10-CM

## 2022-11-13 DIAGNOSIS — I5032 Chronic diastolic (congestive) heart failure: Secondary | ICD-10-CM | POA: Diagnosis not present

## 2022-11-13 DIAGNOSIS — N202 Calculus of kidney with calculus of ureter: Secondary | ICD-10-CM | POA: Insufficient documentation

## 2022-11-13 DIAGNOSIS — Z79899 Other long term (current) drug therapy: Secondary | ICD-10-CM | POA: Insufficient documentation

## 2022-11-13 DIAGNOSIS — R109 Unspecified abdominal pain: Secondary | ICD-10-CM | POA: Diagnosis present

## 2022-11-13 LAB — URINALYSIS, ROUTINE W REFLEX MICROSCOPIC
Bilirubin Urine: NEGATIVE
Glucose, UA: NEGATIVE mg/dL
Hgb urine dipstick: NEGATIVE
Ketones, ur: NEGATIVE mg/dL
Leukocytes,Ua: NEGATIVE
Nitrite: NEGATIVE
Specific Gravity, Urine: 1.032 — ABNORMAL HIGH (ref 1.005–1.030)
pH: 6 (ref 5.0–8.0)

## 2022-11-13 NOTE — ED Triage Notes (Signed)
Right flank pain radiates to pelvic area Started around 6pm Took tylenol and ibuprofen with no relief. Hx of kidney stone reports that this feel similar Uncomfortable in triage

## 2022-11-14 ENCOUNTER — Emergency Department (HOSPITAL_BASED_OUTPATIENT_CLINIC_OR_DEPARTMENT_OTHER): Payer: 59

## 2022-11-14 ENCOUNTER — Other Ambulatory Visit (HOSPITAL_BASED_OUTPATIENT_CLINIC_OR_DEPARTMENT_OTHER): Payer: Self-pay

## 2022-11-14 DIAGNOSIS — Z91040 Latex allergy status: Secondary | ICD-10-CM | POA: Diagnosis not present

## 2022-11-14 DIAGNOSIS — Z7982 Long term (current) use of aspirin: Secondary | ICD-10-CM | POA: Diagnosis not present

## 2022-11-14 DIAGNOSIS — Z79899 Other long term (current) drug therapy: Secondary | ICD-10-CM | POA: Diagnosis not present

## 2022-11-14 DIAGNOSIS — J45909 Unspecified asthma, uncomplicated: Secondary | ICD-10-CM | POA: Diagnosis not present

## 2022-11-14 DIAGNOSIS — I110 Hypertensive heart disease with heart failure: Secondary | ICD-10-CM | POA: Diagnosis not present

## 2022-11-14 DIAGNOSIS — I5032 Chronic diastolic (congestive) heart failure: Secondary | ICD-10-CM | POA: Diagnosis not present

## 2022-11-14 DIAGNOSIS — N202 Calculus of kidney with calculus of ureter: Secondary | ICD-10-CM | POA: Diagnosis not present

## 2022-11-14 LAB — BASIC METABOLIC PANEL
Anion gap: 9 (ref 5–15)
BUN: 16 mg/dL (ref 6–20)
CO2: 24 mmol/L (ref 22–32)
Calcium: 9.6 mg/dL (ref 8.9–10.3)
Chloride: 105 mmol/L (ref 98–111)
Creatinine, Ser: 0.69 mg/dL (ref 0.44–1.00)
GFR, Estimated: >60 mL/min (ref >60)
Glucose, Bld: 135 mg/dL — ABNORMAL HIGH (ref 70–99)
Potassium: 3.7 mmol/L (ref 3.5–5.1)
Sodium: 138 mmol/L (ref 135–145)

## 2022-11-14 LAB — CBC WITH DIFFERENTIAL/PLATELET
Abs Immature Granulocytes: 0.03 10*3/uL (ref 0.00–0.07)
Basophils Absolute: 0 10*3/uL (ref 0.0–0.1)
Basophils Relative: 0 %
Eosinophils Absolute: 0.1 10*3/uL (ref 0.0–0.5)
Eosinophils Relative: 1 %
HCT: 40.5 % (ref 36.0–46.0)
Hemoglobin: 13.5 g/dL (ref 12.0–15.0)
Immature Granulocytes: 0 %
Lymphocytes Relative: 37 %
Lymphs Abs: 4.5 10*3/uL — ABNORMAL HIGH (ref 0.7–4.0)
MCH: 29 pg (ref 26.0–34.0)
MCHC: 33.3 g/dL (ref 30.0–36.0)
MCV: 87.1 fL (ref 80.0–100.0)
Monocytes Absolute: 0.8 10*3/uL (ref 0.1–1.0)
Monocytes Relative: 6 %
Neutro Abs: 6.7 10*3/uL (ref 1.7–7.7)
Neutrophils Relative %: 56 %
Platelets: 292 10*3/uL (ref 150–400)
RBC: 4.65 MIL/uL (ref 3.87–5.11)
RDW: 12.9 % (ref 11.5–15.5)
WBC: 12.1 10*3/uL — ABNORMAL HIGH (ref 4.0–10.5)
nRBC: 0 % (ref 0.0–0.2)

## 2022-11-14 MED ORDER — SODIUM CHLORIDE 0.9 % IV SOLN
12.5000 mg | Freq: Four times a day (QID) | INTRAVENOUS | Status: DC | PRN
  Administered 2022-11-14: 12.5 mg via INTRAVENOUS
  Filled 2022-11-14: qty 0.5

## 2022-11-14 MED ORDER — HYDROMORPHONE HCL 1 MG/ML IJ SOLN
0.5000 mg | Freq: Once | INTRAMUSCULAR | Status: AC
  Administered 2022-11-14: 0.5 mg via INTRAVENOUS
  Filled 2022-11-14: qty 1

## 2022-11-14 MED ORDER — ONDANSETRON HCL 4 MG PO TABS: 4.0000 mg | ORAL_TABLET | Freq: Three times a day (TID) | ORAL | 0 refills | Status: DC | PRN

## 2022-11-14 MED ORDER — SODIUM CHLORIDE 0.9 % IV BOLUS
1000.0000 mL | Freq: Once | INTRAVENOUS | Status: AC
  Administered 2022-11-14: 1000 mL via INTRAVENOUS

## 2022-11-14 MED ORDER — OXYCODONE-ACETAMINOPHEN 5-325 MG PO TABS: 1.0000 | ORAL_TABLET | ORAL | 0 refills | Status: DC | PRN

## 2022-11-14 MED ORDER — TAMSULOSIN HCL 0.4 MG PO CAPS
0.4000 mg | ORAL_CAPSULE | Freq: Every day | ORAL | 0 refills | Status: DC
  Filled 2022-11-14: qty 30, 30d supply, fill #0

## 2022-11-14 MED ORDER — PROMETHAZINE HCL 25 MG/ML IJ SOLN
INTRAMUSCULAR | Status: AC
  Filled 2022-11-14: qty 1

## 2022-11-14 MED ORDER — OXYCODONE-ACETAMINOPHEN 5-325 MG PO TABS
1.0000 | ORAL_TABLET | Freq: Once | ORAL | Status: AC
  Administered 2022-11-14: 1 via ORAL
  Filled 2022-11-14: qty 1

## 2022-11-14 MED ORDER — MORPHINE SULFATE (PF) 4 MG/ML IV SOLN
4.0000 mg | Freq: Once | INTRAVENOUS | Status: AC
  Administered 2022-11-14: 4 mg via INTRAVENOUS
  Filled 2022-11-14: qty 1

## 2022-11-14 MED ORDER — ONDANSETRON HCL 4 MG/2ML IJ SOLN
4.0000 mg | Freq: Once | INTRAMUSCULAR | Status: AC
  Administered 2022-11-14: 4 mg via INTRAVENOUS
  Filled 2022-11-14: qty 2

## 2022-11-14 MED ORDER — KETOROLAC TROMETHAMINE 30 MG/ML IJ SOLN
30.0000 mg | Freq: Once | INTRAMUSCULAR | Status: AC
  Administered 2022-11-14: 30 mg via INTRAVENOUS
  Filled 2022-11-14: qty 1

## 2022-11-14 NOTE — Discharge Instructions (Addendum)
You were found to have a 4 mm right-sided kidney stone.  Follow-up with urology.  Make sure that you are staying hydrated.  Take medications as prescribed.  Do not drive while taking narcotic pain medication.

## 2022-11-14 NOTE — ED Notes (Addendum)
Registration came to the nurse's station stating that the pt had passed out. When this RN and RT went assess pt, pt was A&O and able to transfer herself to a wheelchair to be taken to a treatment room. Pt placed on cardiac monitor, EDP made aware.

## 2022-11-14 NOTE — ED Provider Notes (Incomplete)
[facility] Provider Note   CSN: [id number] Arrival date & time: 11/13/22  2247     History {Add pertinent medical, surgical, social history, OB history to HPI:1} Chief Complaint  Patient presents with   Flank Pain    [name] is a 51 y.o. female.  HPI     This is a 51 year old female who presents with flank pain.  Patient had onset of pain around 6:30 PM.  She has a history of kidney stones and had lithotripsy with Dr. Felipa Eth as recently as September.  Reports pain in the right flank that radiates into the right pelvis.  Reports nausea without vomiting.  Took Tylenol and ibuprofen prior to evaluation without relief.  Of note, patient passed out in the waiting room.  States she had significant pain at that time.  No chest pain or shortness of breath.  No recent fevers.  Home Medications Prior to Admission medications   Medication Sig Start Date End Date Taking? Authorizing Provider  atomoxetine (STRATTERA) 40 MG capsule Take 1 capsule (40 mg total) by mouth daily. 10/16/22   Susy Frizzle, MD  albuterol (PROVENTIL HFA;VENTOLIN HFA) 108 (90 Base) MCG/ACT inhaler Inhale 2 puffs into the lungs every 6 (six) hours as needed for wheezing. 12/02/15   Orlena Sheldon, PA-C  ALPRAZolam Duanne Moron) 0.5 MG tablet Take 1 tablet by mouth 3 times daily as needed. 03/20/22   Susy Frizzle, MD  aspirin EC 81 MG tablet Take 1 tablet (81 mg total) by mouth daily. Swallow whole. 01/30/22   Nahser, Wonda Cheng, MD  cyclobenzaprine (FLEXERIL) 10 MG tablet Take 1 tablet (10 mg total) by mouth 3 (three) times daily as needed for muscle spasms. 07/13/22   Persons, Bevely Palmer, PA  ibuprofen (ADVIL,MOTRIN) 200 MG tablet Take 400 mg by mouth 2 (two) times daily as needed for headache or moderate pain.    [provider]  metFORMIN (GLUCOPHAGE) 500 MG tablet Take 1 tablet (500 mg total) by mouth in the morning, at noon, and at bedtime. 09/16/22   Dennard Nip D, MD   metoprolol tartrate (LOPRESSOR) 50 MG tablet Take 1 tablet (50 mg total) by mouth 2 (two) times daily. 09/02/22   Nahser, Wonda Cheng, MD  metroNIDAZOLE (FLAGYL) 500 MG tablet Take 1 tablet (500 mg total) by mouth 2 (two) times daily for 7 days 10/22/22     nitroGLYCERIN (NITROSTAT) 0.4 MG SL tablet Place 1 tablet (0.4 mg total) under the tongue every 5 (five) minutes as needed for chest pain. 01/30/22   Nahser, Wonda Cheng, MD  ondansetron (ZOFRAN) 4 MG tablet Take 1 tablet (4 mg total) by mouth every 8 (eight) hours as needed for nausea or vomiting. 07/09/22   Susy Frizzle, MD  ondansetron (ZOFRAN-ODT) 4 MG disintegrating tablet Dissolve 1 tablet (4 mg total) by mouth every 8 (eight) hours as needed for nausea or vomiting. 10/19/22   McKenzie, Candee Furbish, MD  oxyCODONE-acetaminophen (PERCOCET) 5-325 MG tablet Take 1 tablet by mouth every 4 (four) hours as needed for severe pain. 08/04/22 08/04/23  Cleon Gustin, MD  phenazopyridine (PYRIDIUM) 200 MG tablet Take 1 tablet (200 mg total) by mouth 3 (three) times daily as needed for pain. 07/29/22   Stoneking, [name] Leader., MD  rosuvastatin (CRESTOR) 20 MG tablet Take 1 tablet  by mouth daily. 02/17/22 09/13/22  Nahser, Wonda Cheng, MD  sacubitril-valsartan (ENTRESTO) 24-26 MG Take 1 tablet by mouth 2 (two) times daily. 12/31/21  Nahser, Wonda Cheng, MD  spironolactone (ALDACTONE) 25 MG tablet Take 1 tablet (25 mg total) by mouth 3 (three) times a week. 05/11/22   Nahser, Wonda Cheng, MD  tamsulosin (FLOMAX) 0.4 MG CAPS capsule Take 1 capsule (0.4 mg total) by mouth daily after supper. 08/04/22   McKenzie, Candee Furbish, MD  Vitamin D, Ergocalciferol, (DRISDOL) 1.25 MG (50000 UNIT) CAPS capsule Take 1 capsule by mouth every 7 days. 09/16/22   Starlyn Skeans, MD      Allergies    Adhesive [tape] and Latex    Review of Systems   Review of Systems  Constitutional:  Negative for fever.  Genitourinary:  Positive for flank pain. Negative for hematuria.  Neurological:   Positive for syncope.  All other systems reviewed and are negative.   Physical Exam Updated Vital Signs BP (!) 154/80   Pulse 78   Temp 98.1 F (36.7 C) (Oral)   Resp 18   SpO2 99%  Physical Exam Vitals and nursing note reviewed.  Constitutional:      Appearance: She is well-developed. She is obese.     Comments: Uncomfortable appearing but nontoxic  HENT:     Head: Normocephalic and atraumatic.  Eyes:     Pupils: Pupils are equal, round, and reactive to light.  Cardiovascular:     Rate and Rhythm: Normal rate and regular rhythm.     Heart sounds: Normal heart sounds.  Pulmonary:     Effort: Pulmonary effort is normal. No respiratory distress.     Breath sounds: No wheezing.  Abdominal:     General: Bowel sounds are normal.     Palpations: Abdomen is soft.     Tenderness: There is no abdominal tenderness. There is right CVA tenderness. There is no left CVA tenderness.  Musculoskeletal:     Cervical back: Neck supple.  Skin:    General: Skin is warm and dry.  Neurological:     Mental Status: She is alert and oriented to person, place, and time.  Psychiatric:        Mood and Affect: Mood normal.     ED Results / Procedures / Treatments   Labs (all labs ordered are listed, but only abnormal results are displayed) Labs Reviewed  URINALYSIS, ROUTINE W REFLEX MICROSCOPIC - Abnormal; Notable for the following components:      Result Value   APPearance HAZY (*)    Specific Gravity, Urine 1.032 (*)    Protein, ur TRACE (*)    Bacteria, UA RARE (*)    All other components within normal limits    EKG None  Radiology No results found.  Procedures Procedures  {Document cardiac monitor, telemetry assessment procedure when appropriate:1}  Medications Ordered in ED Medications  morphine (PF) 4 MG/ML injection 4 mg (4 mg Intravenous Given 11/14/22 0015)  ondansetron (ZOFRAN) injection 4 mg (4 mg Intravenous Given 11/14/22 0015)  sodium chloride 0.9 % bolus 1,000 mL  (1,000 mLs Intravenous New Bag/Given 11/14/22 0014)    ED Course/ Medical Decision Making/ A&P                           Medical Decision Making Amount and/or Complexity of Data Reviewed Labs: ordered. Radiology: ordered.  Risk Prescription drug management.   ***  {Document critical care time when appropriate:1} {Document review of labs and clinical decision tools ie heart score, Chads2Vasc2 etc:1}  {Document your independent review of radiology images, and any outside records:1} {  Document your discussion with family members, caretakers, and with consultants:1} {Document social determinants of health affecting pt's care:1} {Document your decision making why or why not admission, treatments were needed:1} Final Clinical Impression(s) / ED Diagnoses Final diagnoses:  None    Rx / DC Orders ED Discharge Orders     None

## 2022-11-14 NOTE — ED Notes (Signed)
Patient given ginger ale. 

## 2022-11-17 ENCOUNTER — Encounter (HOSPITAL_BASED_OUTPATIENT_CLINIC_OR_DEPARTMENT_OTHER): Payer: Self-pay | Admitting: Urology

## 2022-11-17 ENCOUNTER — Other Ambulatory Visit: Payer: Self-pay | Admitting: Urology

## 2022-11-17 ENCOUNTER — Other Ambulatory Visit: Payer: Self-pay

## 2022-11-17 ENCOUNTER — Encounter: Payer: Self-pay | Admitting: Urology

## 2022-11-17 DIAGNOSIS — R109 Unspecified abdominal pain: Secondary | ICD-10-CM | POA: Diagnosis present

## 2022-11-17 DIAGNOSIS — N201 Calculus of ureter: Secondary | ICD-10-CM | POA: Diagnosis present

## 2022-11-17 NOTE — Progress Notes (Signed)
Spoke w/ via phone for pre-op interview---[name] needs dos----none               Jenkins results------11/13/22 CBC w/ diff and BMP in Epic, 06/04/22 EKG in Epic & chart COVID test -----patient states asymptomatic no test needed Arrive at -------1215 in Wednesday, 11/18/2021 NPO after MN NO Solid Food.  Clear liquids from MN until---1115 Med rec completed Medications to take morning of surgery -----Albuterol inhaler prn, Xanax prn, Flexeril prn, Metoprolol, Zofran prn, Oxycodone-Acetaminophen prn, Crestor Patient instructed to call cardiologist for instructions on holding ASA 81 mg. Diabetic medication -----Hold Metformin day of surgery. Patient instructed no nail polish to be worn day of surgery Patient instructed to bring photo id and insurance card day of surgery Patient aware to have Driver (ride ) / caregiver    for 24 hours after surgery - husband, [name] Patient Special Instructions -----Bring inhaler. Patient is HOH and normally wears hearing aids. She is aware that hearing aids need to be removed prior to OR. Pre-Op special Istructions -----none Patient verbalized understanding of instructions that were given at this phone interview. Patient denies shortness of breath, chest pain, fever, cough at this phone interview.   Patient has a hx of CHF, palpitations, dyspnea, chest pain, HTN, HLD, obesity, prediabetes and OSA. She follows w/ cardiologist, Dr. Grayland Jack, [place] 05/08/22 in Epic and chart (f/u in 1 year.) Patient states that she has not been using her CPAP in the last 2 to 3 months but she has a new one ordered.  06/04/22 EKG in chart & Epic 08/18/2018 heart catherization in Epic 05/30/21 Echo in [place], EF 65 -70% 02/13/22 CT Coronary study in Epic 01/23/22 Chest CT in Epic  Consulted w/ Dr. Fransisco Beau, MDA on 11/17/22. Per Dr. Fransisco Beau, as long as patient is not symptomatic, she will not need cardiac clearance. Patient denied chest pain, dyspnea at this time.

## 2022-11-17 NOTE — Progress Notes (Signed)
I spoke with [name]. We have discussed possible surgery dates and 11/18/2022 was agreed upon by all parties. Patient given information about surgery date, what to expect pre-operatively and post operatively.    We discussed that a pre-op nurse will be calling to set up the pre-op visit that will take place prior to surgery. Informed patient that our office will communicate any additional care to be provided after surgery.    Patients questions or concerns were discussed during our call. Advised to call our office should there be any additional information, questions or concerns that arise. Patient verbalized understanding.

## 2022-11-17 NOTE — Progress Notes (Signed)
Surgical Physician Order Form [facility]   * Scheduling expectation : ASAP  *Length of Case: 60 minutes  *MD Preforming Case: Michaelle Birks, MD  *Assistant Needed: no  *Facility Preference:  [facility]  *Clearance needed: no  *Anticoagulation Instructions: N/A  *Aspirin Instructions: Ok to continue Aspirin  -Admit type: OUTpatient  -Anesthesia: General  -Use Standing Orders:  none  *Diagnosis: Right Ureteral Stone  *Procedure:  Cystoscopy, bilateral retrograde pyelograms, right ureteroscopy, possible laser lithotripsy, insertion of right ureteral stent    Additional orders: N/A  -Equipment:  C-arm, dig Ureteroscope, holmium laser -VTE Prophylaxis Standing Order SCD's       Other:   -Standing Lab Orders Per Anesthesia    Lab other: None  -Standing Test orders EKG/Chest x-ray per Anesthesia       Test other:   - Medications:  Ancef 2gm IV  -Other orders:  N/A  *Post-op visit Date/Instructions:  1 week cysto stent removal

## 2022-11-17 NOTE — H&P (Signed)
Assessment: Right Ureteral Calculus Right flank pain  Plan: I personally reviewed the patient's chart including provider notes from her recent ER visit, lab results, and imaging results. I personally viewed the CT scan from 11/13/2022 which shows a 4 mm calculus in the right proximal ureter with associated obstructive changes and left renal calculi. Treatment options for the right ureteral calculus were discussed with the patient including spontaneous passage, shockwave lithotripsy, and ureteroscopic stone manipulation.  Risk and benefits of each treatment modality were reviewed.  Following our discussion, she elects to proceed with cystoscopy, bilateral retrograde pyelograms, right ureteroscopy, possible laser lithotripsy, and insertion of right ureteral stent. Risks, benefits, alternatives were discussed with the patient in detail.  Potential risks including, but not limited to, infection; bleeding;  injury to urethra, bladder, or ureter; possible need of other treatments; possible failure to remove the calculus; ureteral  stricture formation; cardiac, pulmonary, cerebrovascular events; and anesthetic complications were discussed.  The patient understands and wishes to proceed.  The patient will be scheduled for cystoscopy, bilateral retrograde pyelograms, possible right ureteroscopy, laser lithotripsy, and insertion of right ureteral stent at [facility].  Surgical request is placed with the surgery schedulers and will be scheduled at the patient's/family request. Informed consent is given as documented below. Anesthesia: General  The patient does not have sleep apnea, history of MRSA, history of VRE, history of cardiac device requiring special anesthetic needs. Patient is stable and considered clear for surgical in an outpatient ambulatory surgery setting as well as patient hospital setting.  Consent for Operation or Procedure: Provider Certification I hereby certify that the nature, purpose,  benefits, usual and most frequent risks of, and alternatives to, the operation or procedure have been explained to the patient (or person authorized to sign for the patient) either by me as responsible physician or by the provider who is to perform the operation or procedure. Time spent such that the patient/family has had an opportunity to ask questions, and that those questions have been answered. The patient or the patient's representative has been advised that selected tasks may be performed by assistants to the primary health care provider(s). I believe that the patient (or person authorized to sign for the patient) understands what has been explained, and has consented to the operation or procedure. No guarantees were implied or made.   Chief Complaint: Right ureteral calculus  History of Present Illness:  [name] is a 52 y.o. female who is seen for evaluation and management of a right ureteral calculus.  She has a history of nephrolithiasis and has previously been treated with a right ureteral stent followed by lithotripsy.  She had a right ureteral stent placed while in [place] for a 9 x 7 mm calculus at the right UPJ with associated mild hydronephrosis.  She underwent right ESL on 08/04/2022 and her stent was removed on 08/12/2022.  Posttreatment KUB from 08/25/2022 showed no obvious calcifications in the area of the right renal shadow or along the expected course of the right ureter and a left renal calculus.  She presented to the emergency room on 11/13/2022 with right-sided pain.  No fevers or chills.  She was having some associated nausea without vomiting.  Evaluation in the emergency room demonstrated a elevated white blood cell count to 12 K.  Renal function was normal.  Urinalysis showed 0-5 RBCs, 6-10 WBCs and rare bacteria.  CT renal stone study showed a 4 mm calculus in the proximal right ureter with mild right-sided hydroureter and  adjacent 2 mm and 3 mm nonobstructing calculi  in the mid left kidney.  She has had continued right-sided flank pain and nausea despite taking pain medication and antiemetics.  She has been unable to work due to the pain.  She presents now for surgical management of the right ureteral calculus.   Past Medical History:  Past Medical History:  Diagnosis Date   ADHD (attention deficit hyperactivity disorder)    Follows w/ PCP, Dr. Jenna Luo, [place] 07/07/22.   Anxiety    Follows w/ PCP, Dr. Jenna Luo, [place] 07/07/22.   Asthma    Follows w/ PCP, Dr. Jenna Luo, [place] 07/07/22.   Back pain    low back pain with right-sided sciatica   Chest pain    Chronic CHF [facility])    Follows w/ cardiologist Dr. Grayland Jack, see most recent [place] on 05/08/22 in [place].   Coronary artery disease    non-obstructive, cardiologist Dr. Grayland Jack, [place] 05/08/22. 02/13/22 CT Coronary in Epic, 05/30/21 echo in [place], 06/04/22 EKG in [place], 08/18/18 heart cath in [place], [place] w/ cardiology 05/08/22   Does use hearing aid    Dyspnea    resolved per pt as of 11/17/2022   Echocardiogram 10/25/2015   EF 60-65, normal wall motion, grade 1 diastolic dysfunction, GLS -19.1%   Edema of left lower extremity    Fracture dislocation of elbow joint 01/15/2013   right   GERD (gastroesophageal reflux disease)    Omeprazole as needed, none in 6 mos.   History of echocardiogram 05/30/2021   EF 65 - 70%   History of kidney stones    Hyperlipidemia    Follows w/ PCP, Dr. Jenna Luo @ [place].   Hypertension    Follows w/ PCP, Dr. Vernell [facility]. See OV dated 07/07/2022.   Joint pain    Obesity    Follows with Dr. Dennard Nip at [facility].   Palpitations    Cardiologist, Dr. Grayland Jack.   Pre-diabetes    Follows with [facility], Dr. Dennard Nip, MD, [place] 09/16/22 in Epic.   R/L Cardiac catheterization 08/18/2018   Normal coronary arteries, mean RA 6, mean PA 17, PCWP 10    Seborrheic dermatitis    Sleep apnea    has not been using CPAP for 2 or 3 months per pt on 11/17/22, new one is ordered   Vitamin D deficiency    Wears glasses     Past Surgical History:  Past Surgical History:  Procedure Laterality Date   [place]; 10/10/2000   x 2   EXTRACORPOREAL SHOCK WAVE LITHOTRIPSY Right 08/04/2022   Procedure: EXTRACORPOREAL SHOCK WAVE LITHOTRIPSY (ESWL);  Surgeon: Cleon Gustin, MD;  Location: [facility];  Service: Urology;  Laterality: Right;   KNEE ARTHROSCOPY Left 1991   also in 2019   LAPAROSCOPIC ASSISTED VAGINAL HYSTERECTOMY  11/02/2006   LAPAROSCOPIC LYSIS OF ADHESIONS  03/06/2005   RADIAL HEAD ARTHROPLASTY Right 01/17/2013   Procedure: RIGHT RADIAL HEAD ARTHROPLASTY OPEN REDUCTION INTERNAL FIXATION CORONOID ;  Surgeon: Tennis Must, MD;  Location: [facility];  Service: Orthopedics;  Laterality: Right;   RIGHT/LEFT HEART CATH AND CORONARY ANGIOGRAPHY N/A 08/18/2018   Procedure: RIGHT/LEFT HEART CATH AND CORONARY ANGIOGRAPHY;  Surgeon: Nelva Bush, MD;  Location: [place] [facility];  Service: Cardiovascular;  Laterality: N/A;   TUBAL LIGATION  03/06/2005    Allergies:  Allergies  Allergen Reactions  Adhesive [Tape] Rash   Latex Rash    Family History:  Family History  Problem Relation Age of Onset   Diabetes Sister    Heart disease Father 68       MI--1st Dx CAD. Died2that time   Sudden death Father    Diabetes Mother    Hypertension Mother    Heart disease Mother    Obesity Mother    Heart disease Maternal Uncle    Ovarian cancer Maternal Grandmother    Diabetes Maternal Grandmother    Heart disease Maternal Grandmother        Great Grandmother   Diabetes Paternal Grandmother    Liver disease Paternal Aunt    Heart disease Maternal Grandfather     Social History:  Social History   Tobacco Use   Smoking status: Never    Passive exposure: Past   Smokeless tobacco: Never  Vaping Use   Vaping Use:  Never used  Substance Use Topics   Alcohol use: No   Drug use: No    Review of symptoms:  Constitutional:  Negative for unexplained weight loss, night sweats, fever, chills ENT:  Negative for nose bleeds, sinus pain, painful swallowing CV:  Negative for chest pain, shortness of breath, exercise intolerance, palpitations, loss of consciousness Resp:  Negative for cough, wheezing, shortness of breath GI:  Negative for vomiting, diarrhea, bloody stools GU:  Positives noted in HPI; otherwise negative for gross hematuria, dysuria, urinary incontinence Neuro:  Negative for seizures, poor balance, limb weakness, slurred speech Psych:  Negative for lack of energy, depression, anxiety Endocrine:  Negative for polydipsia, polyuria, symptoms of hypoglycemia (dizziness, hunger, sweating) Hematologic:  Negative for anemia, purpura, petechia, prolonged or excessive bleeding, use of anticoagulants  Allergic:  Negative for difficulty breathing or choking as a result of exposure to anything; no shellfish allergy; no allergic response (rash/itch) to materials, foods  Physical exam: BP (!) 149/78   Pulse 100   Temp 97.7 F (36.5 C) (Oral)   Resp 15   Ht '5\' 2"'$  (1.575 m)   Wt 97.5 kg   SpO2 95%   BMI 39.32 kg/m  GENERAL APPEARANCE:  Well appearing, well developed, well nourished, NAD HEENT: Atraumatic, Normocephalic, oropharynx clear. NECK: Supple without lymphadenopathy or thyromegaly. LUNGS: Clear to auscultation bilaterally. HEART: Regular Rate and Rhythm without murmurs, gallops, or rubs. ABDOMEN: Soft, non-tender, No Masses. EXTREMITIES: Moves all extremities well.  Without clubbing, cyanosis, or edema. NEUROLOGIC:  Alert and oriented x 3, normal gait, CN II-XII grossly intact.  MENTAL STATUS:  Appropriate. BACK:  Non-tender to palpation.  No CVAT SKIN:  Warm, dry and intact.    Results: Recent Results (from the past 2160 hour(s))  Urinalysis, Routine w reflex microscopic     Status:  Abnormal   Collection Time: 08/25/22  4:02 PM  Result Value Ref Range   Specific Gravity, UA 1.020 1.005 - 1.030   pH, UA 7.5 5.0 - 7.5   Color, UA Yellow Yellow   Appearance Ur Cloudy (A) Clear   Leukocytes,UA Trace (A) Negative   Protein,UA 1+ (A) Negative/Trace   Glucose, UA Negative Negative   Ketones, UA Negative Negative   RBC, UA 1+ (A) Negative   Bilirubin, UA Negative Negative   Urobilinogen, Ur 1.0 0.2 - 1.0 mg/dL   Nitrite, UA Negative Negative   Microscopic Examination See below:   Microscopic Examination     Status: Abnormal   Collection Time: 08/25/22  4:02 PM   Urine  Result Value  Ref Range   WBC, UA 6-10 (A) 0 - 5 /hpf   RBC, Urine 3-10 (A) 0 - 2 /hpf   Epithelial Cells (non renal) 0-10 0 - 10 /hpf   Bacteria, UA Few None seen/Few  Urinalysis, Routine w reflex microscopic     Status: Abnormal   Collection Time: 09/15/22  3:51 PM  Result Value Ref Range   Specific Gravity, UA 1.015 1.005 - 1.030   pH, UA 7.0 5.0 - 7.5   Color, UA Yellow Yellow   Appearance Ur Cloudy (A) Clear   Leukocytes,UA Negative Negative   Protein,UA Trace (A) Negative/Trace   Glucose, UA Negative Negative   Ketones, UA Trace (A) Negative   RBC, UA Negative Negative   Bilirubin, UA Negative Negative   Urobilinogen, Ur 1.0 0.2 - 1.0 mg/dL   Nitrite, UA Negative Negative   Microscopic Examination See below:   Microscopic Examination     Status: None   Collection Time: 09/15/22  3:51 PM   Urine  Result Value Ref Range   WBC, UA 0-5 0 - 5 /hpf   RBC, Urine None seen 0 - 2 /hpf   Epithelial Cells (non renal) 0-10 0 - 10 /hpf   Bacteria, UA Few None seen/Few  Urinalysis, Routine w reflex microscopic Urine, Clean Catch     Status: Abnormal   Collection Time: 11/13/22 11:01 PM  Result Value Ref Range   Color, Urine YELLOW YELLOW   APPearance HAZY (A) CLEAR   Specific Gravity, Urine 1.032 (H) 1.005 - 1.030   pH 6.0 5.0 - 8.0   Glucose, UA NEGATIVE NEGATIVE mg/dL   Hgb urine dipstick  NEGATIVE NEGATIVE   Bilirubin Urine NEGATIVE NEGATIVE   Ketones, ur NEGATIVE NEGATIVE mg/dL   Protein, ur TRACE (A) NEGATIVE mg/dL   Nitrite NEGATIVE NEGATIVE   Leukocytes,Ua NEGATIVE NEGATIVE   RBC / HPF 0-5 0 - 5 RBC/hpf   WBC, UA 6-10 0 - 5 WBC/hpf   Bacteria, UA RARE (A) NONE SEEN   Squamous Epithelial / LPF 21-50 0 - 5 /HPF   Mucus PRESENT     Comment: Performed at [facility], [address] [place], [place] [address]  CBC with Differential     Status: Abnormal   Collection Time: 11/13/22 11:54 PM  Result Value Ref Range   WBC 12.1 (H) 4.0 - 10.5 K/uL   RBC 4.65 3.87 - 5.11 MIL/uL   Hemoglobin 13.5 12.0 - 15.0 g/dL   HCT 40.5 36.0 - 46.0 %   MCV 87.1 80.0 - 100.0 fL   MCH 29.0 26.0 - 34.0 pg   MCHC 33.3 30.0 - 36.0 g/dL   RDW 12.9 11.5 - 15.5 %   Platelets 292 150 - 400 K/uL   nRBC 0.0 0.0 - 0.2 %   Neutrophils Relative % 56 %   Neutro Abs 6.7 1.7 - 7.7 K/uL   Lymphocytes Relative 37 %   Lymphs Abs 4.5 (H) 0.7 - 4.0 K/uL   Monocytes Relative 6 %   Monocytes Absolute 0.8 0.1 - 1.0 K/uL   Eosinophils Relative 1 %   Eosinophils Absolute 0.1 0.0 - 0.5 K/uL   Basophils Relative 0 %   Basophils Absolute 0.0 0.0 - 0.1 K/uL   Immature Granulocytes 0 %   Abs Immature Granulocytes 0.03 0.00 - 0.07 K/uL    Comment: Performed at [facility], [address] [place], [place] [address]  Basic metabolic panel     Status: Abnormal  Collection Time: 11/13/22 11:54 PM  Result Value Ref Range   Sodium 138 135 - 145 mmol/L   Potassium 3.7 3.5 - 5.1 mmol/L   Chloride 105 98 - 111 mmol/L   CO2 24 22 - 32 mmol/L   Glucose, Bld 135 (H) 70 - 99 mg/dL    Comment: Glucose reference range applies only to samples taken after fasting for at least 8 hours.   BUN 16 6 - 20 mg/dL   Creatinine, Ser 0.69 0.44 - 1.00 mg/dL   Calcium 9.6 8.9 - 10.3 mg/dL   GFR, Estimated >60 >60 mL/min    Comment: (NOTE) Calculated using the CKD-EPI Creatinine Equation  (2021)    Anion gap 9 5 - 15    Comment: Performed at [facility], [address], [place], [place] [address]

## 2022-11-17 NOTE — Anesthesia Preprocedure Evaluation (Addendum)
Anesthesia Evaluation  Patient identified by MRN, date of birth, ID band Patient awake    Reviewed: Allergy & Precautions, NPO status , Patient's Chart, lab work & pertinent test results  Airway Mallampati: II  TM Distance: >3 FB Neck ROM: Full    Dental no notable dental hx. (+) Teeth Intact, Dental Advisory Given   Pulmonary sleep apnea    Pulmonary exam normal breath sounds clear to auscultation       Cardiovascular hypertension, + CAD and +CHF  Normal cardiovascular exam Rhythm:Regular Rate:Normal  05/2021 Echo 1. Left ventricular ejection fraction, by estimation, is 65 to 70%. The  left ventricle has normal function. The left ventricle has no regional  wall motion abnormalities. Left ventricular diastolic parameters were  normal.   2. Right ventricular systolic function is normal. The right ventricular  size is normal.   3. The mitral valve is normal in structure. No evidence of mitral valve  regurgitation. No evidence of mitral stenosis.   4. The aortic valve is normal in structure. Aortic valve regurgitation is  not visualized.      Neuro/Psych  PSYCHIATRIC DISORDERS Anxiety Depression       GI/Hepatic ,GERD  ,,  Endo/Other  Hypothyroidism    Renal/GU Renal disease (.lastrenal)Lab Results      Component                Value               Date                      CREATININE               0.69                11/13/2022                BUN                      16                  11/13/2022                NA                       138                 11/13/2022                K                        3.7                 11/13/2022                CL                       105                 11/13/2022                CO2                      24                  11/13/2022  Musculoskeletal   Abdominal  (+) + obese (BMI 39.3)  Peds  Hematology Lab Results      Component                Value                Date                      WBC                      12.1 (H)            11/13/2022                HGB                      13.5                11/13/2022                HCT                      40.5                11/13/2022                MCV                      87.1                11/13/2022                PLT                      292                 11/13/2022              Anesthesia Other Findings All: Latex  Reproductive/Obstetrics                             Anesthesia Physical Anesthesia Plan  ASA: 3  Anesthesia Plan: General   Post-op Pain Management:    Induction: Intravenous  PONV Risk Score and Plan: 4 or greater and Treatment may vary due to age or medical condition, Ondansetron, Midazolam and Dexamethasone  Airway Management Planned: LMA  Additional Equipment: None  Intra-op Plan:   Post-operative Plan:   Informed Consent: I have reviewed the patients History and Physical, chart, labs and discussed the procedure including the risks, benefits and alternatives for the proposed anesthesia with the patient or authorized representative who has indicated his/her understanding and acceptance.     Dental advisory given  Plan Discussed with: CRNA and Anesthesiologist  Anesthesia Plan Comments:         Anesthesia Quick Evaluation

## 2022-11-18 ENCOUNTER — Encounter (HOSPITAL_BASED_OUTPATIENT_CLINIC_OR_DEPARTMENT_OTHER): Payer: Self-pay | Admitting: Urology

## 2022-11-18 ENCOUNTER — Ambulatory Visit (HOSPITAL_BASED_OUTPATIENT_CLINIC_OR_DEPARTMENT_OTHER): Payer: Commercial Managed Care - PPO | Admitting: Anesthesiology

## 2022-11-18 ENCOUNTER — Other Ambulatory Visit: Payer: Self-pay

## 2022-11-18 ENCOUNTER — Encounter (HOSPITAL_BASED_OUTPATIENT_CLINIC_OR_DEPARTMENT_OTHER)
Admission: RE | Disposition: A | Discharge status: Home / Self Care | Payer: Self-pay | Source: Home / Self Care | Attending: Urology

## 2022-11-18 ENCOUNTER — Ambulatory Visit (HOSPITAL_BASED_OUTPATIENT_CLINIC_OR_DEPARTMENT_OTHER)
Admission: RE | Admit: 2022-11-18 | Discharge: 2022-11-18 | Disposition: A | Discharge status: Home / Self Care | Payer: Commercial Managed Care - PPO | Attending: Urology | Admitting: Urology

## 2022-11-18 ENCOUNTER — Other Ambulatory Visit (HOSPITAL_COMMUNITY): Payer: Self-pay

## 2022-11-18 DIAGNOSIS — I2510 Atherosclerotic heart disease of native coronary artery without angina pectoris: Secondary | ICD-10-CM | POA: Insufficient documentation

## 2022-11-18 DIAGNOSIS — Z8249 Family history of ischemic heart disease and other diseases of the circulatory system: Secondary | ICD-10-CM | POA: Insufficient documentation

## 2022-11-18 DIAGNOSIS — I110 Hypertensive heart disease with heart failure: Secondary | ICD-10-CM | POA: Insufficient documentation

## 2022-11-18 DIAGNOSIS — Z01818 Encounter for other preprocedural examination: Secondary | ICD-10-CM

## 2022-11-18 DIAGNOSIS — N201 Calculus of ureter: Secondary | ICD-10-CM

## 2022-11-18 DIAGNOSIS — N132 Hydronephrosis with renal and ureteral calculous obstruction: Secondary | ICD-10-CM | POA: Insufficient documentation

## 2022-11-18 DIAGNOSIS — R109 Unspecified abdominal pain: Secondary | ICD-10-CM | POA: Diagnosis present

## 2022-11-18 DIAGNOSIS — N200 Calculus of kidney: Secondary | ICD-10-CM

## 2022-11-18 DIAGNOSIS — I509 Heart failure, unspecified: Secondary | ICD-10-CM

## 2022-11-18 DIAGNOSIS — Z87442 Personal history of urinary calculi: Secondary | ICD-10-CM | POA: Diagnosis not present

## 2022-11-18 DIAGNOSIS — G4730 Sleep apnea, unspecified: Secondary | ICD-10-CM | POA: Diagnosis not present

## 2022-11-18 HISTORY — PX: CYSTOSCOPY WITH RETROGRADE PYELOGRAM, URETEROSCOPY AND STENT PLACEMENT: SHX5789

## 2022-11-18 HISTORY — PX: HOLMIUM LASER APPLICATION: SHX5852

## 2022-11-18 HISTORY — DX: Vitamin D deficiency, unspecified: E55.9

## 2022-11-18 HISTORY — DX: Presence of external hearing-aid: Z97.4

## 2022-11-18 HISTORY — DX: Prediabetes: R73.03

## 2022-11-18 HISTORY — DX: Attention-deficit hyperactivity disorder, unspecified type: F90.9

## 2022-11-18 HISTORY — DX: Presence of spectacles and contact lenses: Z97.3

## 2022-11-18 HISTORY — DX: Personal history of urinary calculi: Z87.442

## 2022-11-18 HISTORY — DX: Atherosclerotic heart disease of native coronary artery without angina pectoris: I25.10

## 2022-11-18 LAB — GLUCOSE, CAPILLARY: Glucose-Capillary: 78 mg/dL (ref 70–99)

## 2022-11-18 SURGERY — CYSTOURETEROSCOPY, WITH RETROGRADE PYELOGRAM AND STENT INSERTION
Anesthesia: General | Site: Pelvis | Laterality: Right

## 2022-11-18 MED ORDER — FENTANYL CITRATE (PF) 100 MCG/2ML IJ SOLN
INTRAMUSCULAR | Status: AC
  Filled 2022-11-18: qty 2

## 2022-11-18 MED ORDER — OXYCODONE-ACETAMINOPHEN 5-325 MG PO TABS
1.0000 | ORAL_TABLET | ORAL | 0 refills | Status: DC | PRN
  Filled 2022-11-18 – 2022-11-19 (×2): qty 15, 3d supply, fill #0

## 2022-11-18 MED ORDER — DEXAMETHASONE SODIUM PHOSPHATE 10 MG/ML IJ SOLN
INTRAMUSCULAR | Status: DC | PRN
  Administered 2022-11-18: 4 mg via INTRAVENOUS

## 2022-11-18 MED ORDER — LIDOCAINE HCL (CARDIAC) PF 100 MG/5ML IV SOSY
PREFILLED_SYRINGE | INTRAVENOUS | Status: DC | PRN
  Administered 2022-11-18: 60 mg via INTRAVENOUS

## 2022-11-18 MED ORDER — PROPOFOL 10 MG/ML IV BOLUS
INTRAVENOUS | Status: DC | PRN
  Administered 2022-11-18: 200 mg via INTRAVENOUS

## 2022-11-18 MED ORDER — KETAMINE HCL 10 MG/ML IJ SOLN: INTRAMUSCULAR | Status: DC | PRN

## 2022-11-18 MED ORDER — MIDAZOLAM HCL 2 MG/2ML IJ SOLN
INTRAMUSCULAR | Status: AC
  Filled 2022-11-18: qty 2

## 2022-11-18 MED ORDER — ACETAMINOPHEN 10 MG/ML IV SOLN: 1000.0000 mg | Freq: Once | INTRAVENOUS | Status: DC | PRN

## 2022-11-18 MED ORDER — DEXMEDETOMIDINE HCL IN NACL 80 MCG/20ML IV SOLN
INTRAVENOUS | Status: DC | PRN
  Administered 2022-11-18: 8 ug via INTRAVENOUS

## 2022-11-18 MED ORDER — AMISULPRIDE (ANTIEMETIC) 5 MG/2ML IV SOLN: 10.0000 mg | Freq: Once | INTRAVENOUS | Status: DC | PRN

## 2022-11-18 MED ORDER — ONDANSETRON HCL 4 MG/2ML IJ SOLN
4.0000 mg | Freq: Once | INTRAMUSCULAR | Status: AC | PRN
  Administered 2022-11-18: 4 mg via INTRAVENOUS

## 2022-11-18 MED ORDER — PROPOFOL 10 MG/ML IV BOLUS
INTRAVENOUS | Status: AC
  Filled 2022-11-18: qty 20

## 2022-11-18 MED ORDER — CEFAZOLIN SODIUM-DEXTROSE 2-4 GM/100ML-% IV SOLN
2.0000 g | INTRAVENOUS | Status: AC
  Administered 2022-11-18: 2 g via INTRAVENOUS

## 2022-11-18 MED ORDER — HYDROMORPHONE HCL 1 MG/ML IJ SOLN: 0.2500 mg | INTRAMUSCULAR | Status: DC | PRN

## 2022-11-18 MED ORDER — FENTANYL CITRATE (PF) 100 MCG/2ML IJ SOLN
INTRAMUSCULAR | Status: DC | PRN
  Administered 2022-11-18: 50 ug via INTRAVENOUS
  Administered 2022-11-18 (×2): 25 ug via INTRAVENOUS

## 2022-11-18 MED ORDER — CEFAZOLIN SODIUM-DEXTROSE 2-4 GM/100ML-% IV SOLN
INTRAVENOUS | Status: AC
  Filled 2022-11-18: qty 100

## 2022-11-18 MED ORDER — ONDANSETRON HCL 4 MG/2ML IJ SOLN
INTRAMUSCULAR | Status: AC
  Filled 2022-11-18: qty 2

## 2022-11-18 MED ORDER — IOHEXOL 300 MG/ML  SOLN
INTRAMUSCULAR | Status: DC | PRN
  Administered 2022-11-18: 7 mL

## 2022-11-18 MED ORDER — LIDOCAINE HCL URETHRAL/MUCOSAL 2 % EX GEL
CUTANEOUS | Status: DC | PRN
  Administered 2022-11-18: 1 via TOPICAL

## 2022-11-18 MED ORDER — MIDAZOLAM HCL 5 MG/5ML IJ SOLN
INTRAMUSCULAR | Status: DC | PRN
  Administered 2022-11-18: 2 mg via INTRAVENOUS

## 2022-11-18 MED ORDER — SODIUM CHLORIDE 0.9 % IR SOLN
Status: DC | PRN
  Administered 2022-11-18: 3000 mL

## 2022-11-18 MED ORDER — PHENAZOPYRIDINE HCL 200 MG PO TABS
200.0000 mg | ORAL_TABLET | Freq: Three times a day (TID) | ORAL | 1 refills | Status: DC | PRN
  Filled 2022-11-18 – 2022-11-19 (×2): qty 20, 7d supply, fill #0

## 2022-11-18 MED ORDER — CEFDINIR 300 MG PO CAPS
300.0000 mg | ORAL_CAPSULE | Freq: Two times a day (BID) | ORAL | 0 refills | Status: AC
  Filled 2022-11-18 – 2022-11-19 (×2): qty 14, 7d supply, fill #0

## 2022-11-18 MED ORDER — OXYCODONE HCL 5 MG PO TABS: 5.0000 mg | ORAL_TABLET | Freq: Once | ORAL | Status: DC | PRN

## 2022-11-18 MED ORDER — KETOROLAC TROMETHAMINE 30 MG/ML IJ SOLN
INTRAMUSCULAR | Status: AC
  Filled 2022-11-18: qty 1

## 2022-11-18 MED ORDER — OXYCODONE HCL 5 MG/5ML PO SOLN: 5.0000 mg | Freq: Once | ORAL | Status: DC | PRN

## 2022-11-18 MED ORDER — LACTATED RINGERS IV SOLN
INTRAVENOUS | Status: DC
  Administered 2022-11-18: 1000 mL via INTRAVENOUS

## 2022-11-18 MED ORDER — KETOROLAC TROMETHAMINE 30 MG/ML IJ SOLN
INTRAMUSCULAR | Status: DC | PRN
  Administered 2022-11-18: 30 mg via INTRAVENOUS

## 2022-11-18 MED ORDER — FENTANYL CITRATE (PF) 100 MCG/2ML IJ SOLN
50.0000 ug | Freq: Once | INTRAMUSCULAR | Status: AC
  Administered 2022-11-18: 50 ug via INTRAVENOUS

## 2022-11-18 SURGICAL SUPPLY — 17 items
BAG URO CATCHER STRL LF (MISCELLANEOUS) ×3 IMPLANT
CATH URET 5FR 28IN OPEN ENDED (CATHETERS) ×3 IMPLANT
CATH URETERAL DUAL LUMEN 10F (MISCELLANEOUS) IMPLANT
CLOTH BEACON ORANGE TIMEOUT ST (SAFETY) ×3 IMPLANT
EXTRACTOR STONE NITINOL NGAGE (UROLOGICAL SUPPLIES) IMPLANT
GLOVE BIO SURGEON STRL SZ8 (GLOVE) ×3 IMPLANT
GOWN STRL REUS W/TWL LRG LVL3 (GOWN DISPOSABLE) ×3 IMPLANT
GOWN STRL REUS W/TWL XL LVL3 (GOWN DISPOSABLE) ×3 IMPLANT
GUIDEWIRE STR DUAL SENSOR (WIRE) ×3 IMPLANT
IV NS IRRIG 3000ML ARTHROMATIC (IV SOLUTION) ×6 IMPLANT
KIT TURNOVER CYSTO (KITS) ×3 IMPLANT
MANIFOLD NEPTUNE II (INSTRUMENTS) ×3 IMPLANT
PACK CYSTO (CUSTOM PROCEDURE TRAY) ×3 IMPLANT
SHEATH NAVIGATOR HD 11/13X36 (SHEATH) IMPLANT
STENT URET 6FRX24 CONTOUR (STENTS) IMPLANT
TUBING UROLOGY SET (TUBING) ×3 IMPLANT
WATER STERILE IRR 500ML POUR (IV SOLUTION) ×3 IMPLANT

## 2022-11-18 NOTE — Discharge Instructions (Signed)
[facility] °[phone] °Post Ureteroscopy With or Without Stent Instructions ° °Definitions: ° °Ureter: The duct that transports urine from the kidney to the bladder. °Stent:   A plastic hollow tube that is placed into the ureter, from the kidney to the                 bladder to prevent the ureter from swelling shut. ° °GENERAL INSTRUCTIONS: ° °Despite the fact that no skin incisions were used, the area around the ureter and bladder is raw and irritated. The stent is a foreign body which will further irritate the bladder wall. This irritation is manifested by increased frequency of urination, both day and night, and by an increase in the urge to urinate. In some, the urge to urinate is present almost always. Sometimes the urge is strong enough that you may not be able to stop yourself from urinating. The only real cure is to remove the stent and then give time for the bladder wall to heal which can't be done until the danger of the ureter swelling shut has passed, which varies. ° °You may see some blood in your urine while the stent is in place and a few days afterwards. Do not be alarmed, even if the urine was clear for a while. Get off your feet and drink lots of fluids until clearing occurs. If you start to pass clots or don't improve, call us. ° °DIET: °You may return to your normal diet immediately. Because of the raw surface of your bladder, alcohol, spicy foods, acid type foods and drinks with caffeine may cause irritation or frequency and should be used in moderation. To keep your urine flowing freely and to avoid constipation, drink plenty of fluids during the day ( 8-10 glasses ). °Tip: Avoid cranberry juice because it is very acidic. ° °ACTIVITY: °Your physical activity doesn't need to be restricted. However, if you are very active, you may see some blood in your urine. We suggest that you reduce your activity under these circumstances until the bleeding has stopped. ° °BOWELS: °It is  important to keep your bowels regular during the postoperative period. Straining with bowel movements can cause bleeding. A bowel movement every other day is reasonable. Use a mild laxative if needed, such as Milk of Magnesia 2-3 tablespoons, or 2 Dulcolax tablets. Call if you continue to have problems. If you have been taking narcotics for pain, before, during or after your surgery, you may be constipated. Take a laxative if necessary. ° ° °MEDICATION: °You should resume your pre-surgery medications unless told not to. In addition you will often be given an antibiotic to prevent infection. These should be taken as prescribed until the bottles are finished unless you are having an unusual reaction to one of the drugs. ° °PROBLEMS YOU SHOULD REPORT TO US: °· Fevers over 100.5 Fahrenheit. °· Heavy bleeding, or clots ( See above notes about blood in urine ). °· Inability to urinate. °· Drug reactions ( hives, rash, nausea, vomiting, diarrhea ). °· Severe burning or pain with urination that is not improving. ° °FOLLOW-UP: °You will need a follow-up appointment to monitor your progress. Call for this appointment at the number listed above. Usually the first appointment will be about three to fourteen days after your surgery. ° ° ° ° ° °Post Anesthesia Home Care Instructions ° °Activity: °Get plenty of rest for the remainder of the day. A responsible individual must stay with you for 24 hours following the procedure.  °  For the next 24 hours, DO NOT: °-Drive a car °-Operate machinery °-Drink alcoholic beverages °-Take any medication unless instructed by your physician °-Make any legal decisions or sign important papers. ° °Meals: °Start with liquid foods such as gelatin or soup. Progress to regular foods as tolerated. Avoid greasy, spicy, heavy foods. If nausea and/or vomiting occur, drink only clear liquids until the nausea and/or vomiting subsides. Call your physician if vomiting continues. ° °Special  Instructions/Symptoms: °Your throat may feel dry or sore from the anesthesia or the breathing tube placed in your throat during surgery. If this causes discomfort, gargle with warm salt water. The discomfort should disappear within 24 hours. ° °If you had a scopolamine patch placed behind your ear for the management of post- operative nausea and/or vomiting: ° °1. The medication in the patch is effective for 72 hours, after which it should be removed.  Wrap patch in a tissue and discard in the trash. Wash hands thoroughly with soap and water. °2. You may remove the patch earlier than 72 hours if you experience unpleasant side effects which may include dry mouth, dizziness or visual disturbances. °3. Avoid touching the patch. Wash your hands with soap and water after contact with the patch. °  ° °

## 2022-11-18 NOTE — Interval H&P Note (Signed)
History and Physical Interval Note:  11/18/2022 1:16 PM  [name]  has presented today for surgery, with the diagnosis of Right Ureteral Stone.  The various methods of treatment have been discussed with the patient and family. After consideration of risks, benefits and other options for treatment, the patient has consented to  Procedure(s): CYSTOSCOPY WITH RETROGRADE PYELOGRAM, URETEROSCOPY AND STENT PLACEMENT (Right) HOLMIUM LASER APPLICATION (Right) as a surgical intervention.  The patient's history has been reviewed, patient examined, no change in status, stable for surgery.  I have reviewed the patient's chart and labs.  Questions were answered to the patient's satisfaction.     Michaelle Birks

## 2022-11-18 NOTE — Transfer of Care (Signed)
Immediate Anesthesia Transfer of Care Note  Patient: [name]  Procedure(s) Performed: CYSTOSCOPY WITH RETROGRADE PYELOGRAM AND STENT PLACEMENT (Right: Pelvis) HOLMIUM LASER APPLICATION/aborted (Right)  Patient Location: PACU  Anesthesia Type:General  Level of Consciousness: awake, alert , and oriented  Airway & Oxygen Therapy: Patient Spontanous Breathing and Patient connected to nasal cannula oxygen  Post-op Assessment: Report given to RN and Post -op Vital signs reviewed and stable  Post vital signs: Reviewed and stable  Last Vitals:  Vitals Value Taken Time  BP 126/69 11/18/22 1510  Temp    Pulse 100 11/18/22 1511  Resp 19 11/18/22 1511  SpO2 95 % 11/18/22 1511  Vitals shown include unvalidated device data.  Last Pain:  Vitals:   11/18/22 1345  TempSrc:   PainSc: 8       Patients Stated Pain Goal: 3 (86/48/47 2072)  Complications: No notable events documented.

## 2022-11-18 NOTE — Op Note (Signed)
OPERATIVE NOTE   Patient Name: [name]  MRN: [id number]   Date of Procedure: 11/18/22  Preoperative diagnosis:  Right ureteral calculus Right flank pain  Postoperative diagnosis:  Right ureteral calculus Right flank pain  Procedure:  Cystoscopy Right retrograde pyelogram Insertion of right ureteral stent (22F x 24 cm, no tether) Fluoroscopy with intraoperative interpretation  Attending: Primus Bravo, MD  Anesthesia: General  Estimated blood loss: None  Fluids: Per anesthesia record  Drains: 22F x 24 cm right ureteral stent, no tether  Specimens: None  Antibiotics: Ancef 2 gm IV  Findings: Normal urethra and bladder; 4 mm stone in proximal right ureter with mild dilation; narrow ureter would not allow easy passage of ureteroscope or dilation  Indications:  52 year old female presents for surgical management of a right ureteral calculus and associated flank pain.  She has a history of nephrolithiasis and has previously been treated with a right ureteral stent followed by lithotripsy in September 2023.  She presented to the emergency room on 11/13/2022 with right-sided pain.  She had associated nausea without vomiting.  CT imaging showed a 4 mm calculus in the proximal right ureter with mild right-sided hydronephrosis and nonobstructing stones in the left kidney.  She has continued to have right flank pain despite taking pain medication.  She presents now for cystoscopy with right retrograde pyelogram, possible right ureteroscopy, possible laser lithotripsy, and insertion of right ureteral stent.  Risk and benefits of the procedure were discussed in detail.  She understands wished to proceed as described.  Description of Procedure:  Patient received IV Ancef preoperatively.  After successful induction of a general anesthetic, the patient was placed in the dorsolithotomy position.  The patient's genitalia was prepped and draped in sterile fashion.  Under direct  visualization, a 6 French rigid cystoscope was passed through the urethra into the bladder.  No urethral or bladder abnormalities were noted.  No stones were seen within the bladder.  A normal-appearing trigone was seen.  A right retrograde pyelogram was performed for evaluation of the patient's right upper tract given the findings on CT scan of a right ureteral calculus.  Scout film showed a nonspecific bowel gas pattern and no obvious bony abnormalities.  There appeared to be a small calcification in the right upper abdomen consistent with the known stone.  Using a 5 [place] open-ended catheter, contrast was injected into the right ureter.  The right distal and mid ureter were normal in appearance without filling defect.  The previously seen calcification was confirmed to be within the right proximal ureter.  There was mild dilation of the ureter and renal pelvis on the right.    A sensor guidewire was passed through the open-ended catheter into the right renal pelvis under fluoroscopic guidance.  A second guidewire was placed using a dual-lumen ureteral catheter.  A single-lumen ureteroscope was not available.  A dual-lumen flexible ureteroscope  would not pass easily over the guidewire and into the distal ureter.  I attempted gentle dilation with an 23 French access sheath but again met some resistance.  Consequently, I elected to place a ureteral stent to avoid any ureteral trauma.  A 6 French by 24 cm double-J stent was then passed over the guidewire and into the right renal pelvis.  Position was confirmed with fluoroscopy.  The tether was removed prior to stent placement.  The bladder was then drained and the cystoscope was removed.  The patient received intraurethral lidocaine jelly at the end of  the procedure.  She was extubated taken to the post anesthesia care unit in stable condition.  Complications: None  Condition: Stable, extubated, transferred to PACU  Plan:  Discharge to home Continue  right ureteral stent Return to office in 1 week with KUB

## 2022-11-18 NOTE — Anesthesia Procedure Notes (Cosign Needed Addendum)
Procedure Name: LMA Insertion Date/Time: 11/18/2022 2:15 PM  Performed by: Barnet Glasgow, MDPre-anesthesia Checklist: Patient identified, Emergency Drugs available, Suction available and Patient being monitored Patient Re-evaluated:Patient Re-evaluated prior to induction Oxygen Delivery Method: Circle system utilized Preoxygenation: Pre-oxygenation with 100% oxygen Induction Type: IV induction LMA: LMA inserted LMA Size: 4.0 Number of attempts: 1 Dental Injury: Teeth and Oropharynx as per pre-operative assessment

## 2022-11-18 NOTE — Addendum Note (Signed)
Addendum  created 11/18/22 1630 by Georgeanne Nim, CRNA   Flowsheet accepted, Intraprocedure Meds edited

## 2022-11-18 NOTE — Anesthesia Postprocedure Evaluation (Signed)
Anesthesia Post Note  Patient: [name]  Procedure(s) Performed: CYSTOSCOPY WITH RETROGRADE PYELOGRAM AND STENT PLACEMENT (Right: Pelvis) HOLMIUM LASER APPLICATION/aborted (Right)     Patient location during evaluation: PACU Anesthesia Type: General Level of consciousness: awake and alert Pain management: pain level controlled Vital Signs Assessment: post-procedure vital signs reviewed and stable Respiratory status: spontaneous breathing, nonlabored ventilation, respiratory function stable and patient connected to nasal cannula oxygen Cardiovascular status: blood pressure returned to baseline and stable Postop Assessment: no apparent nausea or vomiting Anesthetic complications: no  No notable events documented.  Last Vitals:  Vitals:   11/18/22 1532 11/18/22 1545  BP:  132/74  Pulse:  91  Resp:  20  Temp:    SpO2: 92% 93%    Last Pain:  Vitals:   11/18/22 1545  TempSrc:   PainSc: 3                  Barnet Glasgow

## 2022-11-19 ENCOUNTER — Encounter (HOSPITAL_BASED_OUTPATIENT_CLINIC_OR_DEPARTMENT_OTHER): Payer: Self-pay | Admitting: Urology

## 2022-11-19 ENCOUNTER — Other Ambulatory Visit: Payer: Self-pay

## 2022-11-19 ENCOUNTER — Other Ambulatory Visit (HOSPITAL_COMMUNITY): Payer: Self-pay

## 2022-11-19 DIAGNOSIS — N200 Calculus of kidney: Secondary | ICD-10-CM

## 2022-11-21 ENCOUNTER — Encounter: Payer: Self-pay | Admitting: Family Medicine

## 2022-11-23 ENCOUNTER — Other Ambulatory Visit: Payer: Self-pay | Admitting: Family Medicine

## 2022-11-23 DIAGNOSIS — K8020 Calculus of gallbladder without cholecystitis without obstruction: Secondary | ICD-10-CM

## 2022-11-23 DIAGNOSIS — R1011 Right upper quadrant pain: Secondary | ICD-10-CM

## 2022-11-25 ENCOUNTER — Encounter: Payer: Self-pay | Admitting: Urology

## 2022-11-25 ENCOUNTER — Ambulatory Visit (INDEPENDENT_AMBULATORY_CARE_PROVIDER_SITE_OTHER): Payer: Commercial Managed Care - PPO | Admitting: Urology

## 2022-11-25 ENCOUNTER — Ambulatory Visit (HOSPITAL_BASED_OUTPATIENT_CLINIC_OR_DEPARTMENT_OTHER)
Admission: RE | Admit: 2022-11-25 | Discharge: 2022-11-25 | Disposition: A | Discharge status: Home / Self Care | Payer: Commercial Managed Care - PPO | Source: Ambulatory Visit | Attending: Urology | Admitting: Urology

## 2022-11-25 ENCOUNTER — Other Ambulatory Visit: Payer: Self-pay | Admitting: Urology

## 2022-11-25 VITALS — BP 119/79 | HR 80 | Temp 97.9°F | Ht 62.0 in | Wt 211.0 lb

## 2022-11-25 DIAGNOSIS — N201 Calculus of ureter: Secondary | ICD-10-CM | POA: Diagnosis not present

## 2022-11-25 DIAGNOSIS — Z466 Encounter for fitting and adjustment of urinary device: Secondary | ICD-10-CM | POA: Diagnosis not present

## 2022-11-25 DIAGNOSIS — I878 Other specified disorders of veins: Secondary | ICD-10-CM | POA: Diagnosis not present

## 2022-11-25 DIAGNOSIS — N200 Calculus of kidney: Secondary | ICD-10-CM | POA: Diagnosis not present

## 2022-11-25 DIAGNOSIS — N2889 Other specified disorders of kidney and ureter: Secondary | ICD-10-CM | POA: Diagnosis not present

## 2022-11-25 LAB — URINALYSIS
Bilirubin, UA: NEGATIVE
Glucose, UA: NEGATIVE mg/dL
Ketones, POC UA: NEGATIVE mg/dL
Nitrite, UA: NEGATIVE
Protein Ur, POC: 30 mg/dL — AB
Spec Grav, UA: 1.02 (ref 1.010–1.025)
Urobilinogen, UA: 0.2 E.U./dL
pH, UA: 6.5 (ref 5.0–8.0)

## 2022-11-25 NOTE — H&P (View-Only) (Signed)
Assessment: 1. Ureteral calculus, right   2. Nephrolithiasis     Plan: I personally viewed the KUB study from this morning.  This shows a right ureteral stent in good position.  A 4 x 5 mm faint calcification is seen just lateral to proximal stent consistent with known calculus. Options for management of the right renal calculus discussed including removal of stent with spontaneous passage, shockwave lithotripsy, or ureteroscopic stone manipulation.  She is interested in proceeding with shockwave lithotripsy. She would like to consult with general surgery tomorrow regarding her cholelithiasis prior to scheduling treatment for her renal calculus.  Procedure: The patient will be scheduled for Right ESL at [facility]  Surgical request is placed with the surgery schedulers and will be scheduled at the patient's/family request. Informed consent is given as documented below. Anesthesia:  local  The patient does not have sleep apnea, history of MRSA, history of VRE, history of cardiac device requiring special anesthetic needs. Patient is stable and considered clear for surgical in an outpatient ambulatory surgery setting as well as patient hospital setting.  Consent for Operation or Procedure: Provider Certification I hereby certify that the nature, purpose, benefits, usual and most frequent risks of, and alternatives to, the operation or procedure have been explained to the patient (or person authorized to sign for the patient) either by me as responsible physician or by the provider who is to perform the operation or procedure. Time spent such that the patient/family has had an opportunity to ask questions, and that those questions have been answered. The patient or the patient's representative has been advised that selected tasks may be performed by assistants to the primary health care provider(s). I believe that the patient (or person authorized to sign for the patient) understands what has  been explained, and has consented to the operation or procedure. No guarantees were implied or made.   Chief Complaint:  Chief Complaint  Patient presents with   ureteral calculus    History of Present Illness:  [name] is a 52 y.o. female who is seen for further evaluation of a right ureteral calculus and nephrolithiasis.  She has a history of nephrolithiasis and has previously been treated with a right ureteral stent followed by lithotripsy.  She had a right ureteral stent placed while in [place] for a 9 x 7 mm calculus at the right UPJ with associated mild hydronephrosis.  She underwent right ESL on 08/04/2022 and her stent was removed on 08/12/2022.  Posttreatment KUB from 08/25/2022 showed no obvious calcifications in the area of the right renal shadow or along the expected course of the right ureter and a left renal calculus.   She presented to the emergency room on 11/13/2022 with right-sided pain.  No fevers or chills.  She was having some associated nausea without vomiting.  Evaluation in the emergency room demonstrated a elevated white blood cell count to 12 K.  Renal function was normal. Urinalysis showed 0-5 RBCs, 6-10 WBCs and rare bacteria.  CT renal stone study showed a 4 mm calculus in the proximal right ureter with mild right-sided hydroureter and adjacent 2 mm and 3 mm nonobstructing calculi in the mid left kidney.  She  had continued right-sided flank pain and nausea despite taking pain medication and antiemetics.  She underwent cystoscopy, right retrograde pyelogram, attempted right uteroscopy, and insertion of a right ureteral stent on 11/17/2022.  Unfortunately, the flexible ureteroscope was not able to pass into the proximal ureter easily.  She  returns today for scheduled follow-up.  She continues to have some discomfort in the right flank area.  She has also had some pain in the right shoulder area.  She has nausea without vomiting.  She is not currently taking any  pain medication.  She continues on her antibiotics and tamsulosin.  She is voiding without difficulty.  No dysuria or gross hematuria.  She is scheduled to see general surgery tomorrow for evaluation of her cholelithiasis.  Portions of the above documentation were copied from a prior visit for review purposes only.   Past Medical History:  Past Medical History:  Diagnosis Date   ADHD (attention deficit hyperactivity disorder)    Follows w/ PCP, Dr. Jenna Luo, [place] 07/07/22.   Anxiety    Follows w/ PCP, Dr. Jenna Luo, [place] 07/07/22.   Asthma    Follows w/ PCP, Dr. Jenna Luo, [place] 07/07/22.   Back pain    low back pain with right-sided sciatica   Chest pain    Chronic CHF [facility])    Follows w/ cardiologist Dr. Grayland Jack, see most recent [place] on 05/08/22 in [place].   Coronary artery disease    non-obstructive, cardiologist Dr. Grayland Jack, [place] 05/08/22. 02/13/22 CT Coronary in Epic, 05/30/21 echo in [place], 06/04/22 EKG in [place], 08/18/18 heart cath in [place], [place] w/ cardiology 05/08/22   Does use hearing aid    Dyspnea    resolved per pt as of 11/17/2022   Echocardiogram 10/25/2015   EF 60-65, normal wall motion, grade 1 diastolic dysfunction, GLS -19.1%   Edema of left lower extremity    Fracture dislocation of elbow joint 01/15/2013   right   GERD (gastroesophageal reflux disease)    Omeprazole as needed, none in 6 mos.   History of echocardiogram 05/30/2021   EF 65 - 70%   History of kidney stones    Hyperlipidemia    Follows w/ PCP, Dr. Jenna Luo @ [place].   Hypertension    Follows w/ PCP, Dr. Vernell Barrier [facility]. See OV dated 07/07/2022.   Joint pain    Obesity    Follows with Dr. Dennard Nip at [facility].   Palpitations    Cardiologist, Dr. Grayland Jack.   Pre-diabetes    Follows with [facility], Dr. Dennard Nip, [place], [place] 09/16/22 in Epic.   R/L Cardiac catheterization 08/18/2018    Normal coronary arteries, mean RA 6, mean PA 17, PCWP 10   Seborrheic dermatitis    Sleep apnea    has not been using CPAP for 2 or 3 months per pt on 11/17/22, new one is ordered   Vitamin D deficiency    Wears glasses     Past Surgical History:  Past Surgical History:  Procedure Laterality Date   [place]; 10/10/2000   x 2   CYSTOSCOPY WITH RETROGRADE PYELOGRAM, URETEROSCOPY AND STENT PLACEMENT Right 11/18/2022   Procedure: CYSTOSCOPY WITH RETROGRADE PYELOGRAM AND STENT PLACEMENT;  Surgeon: Primus Bravo., MD;  Location: [facility];  Service: Urology;  Laterality: Right;   EXTRACORPOREAL SHOCK WAVE LITHOTRIPSY Right 08/04/2022   Procedure: EXTRACORPOREAL SHOCK WAVE LITHOTRIPSY (ESWL);  Surgeon: Cleon Gustin, MD;  Location: [facility];  Service: Urology;  Laterality: Right;   HOLMIUM LASER APPLICATION Right 04/17/2296   Procedure: HOLMIUM LASER APPLICATION/aborted;  Surgeon: Primus Bravo., MD;  Location: [facility];  Service: Urology;  Laterality: Right;   KNEE  ARTHROSCOPY Left 1991   also in 2019   [place]  11/02/2006   LAPAROSCOPIC LYSIS OF ADHESIONS  03/06/2005   RADIAL HEAD ARTHROPLASTY Right 01/17/2013   Procedure: RIGHT RADIAL HEAD ARTHROPLASTY OPEN REDUCTION INTERNAL FIXATION CORONOID ;  Surgeon: Tennis Must, MD;  Location: [facility];  Service: Orthopedics;  Laterality: Right;   RIGHT/LEFT HEART CATH AND CORONARY ANGIOGRAPHY N/A 08/18/2018   Procedure: RIGHT/LEFT HEART CATH AND CORONARY ANGIOGRAPHY;  Surgeon: Nelva Bush, MD;  Location: [facility];  Service: Cardiovascular;  Laterality: N/A;   TUBAL LIGATION  03/06/2005    Allergies:  Allergies  Allergen Reactions   Adhesive [Tape] Rash   Latex Rash    Family History:  Family History  Problem Relation Age of Onset   Diabetes Sister    Heart disease Father 81       MI--1st Dx CAD. Died2that time    Sudden death Father    Diabetes Mother    Hypertension Mother    Heart disease Mother    Obesity Mother    Heart disease Maternal Uncle    Ovarian cancer Maternal Grandmother    Diabetes Maternal Grandmother    Heart disease Maternal Grandmother        Great Grandmother   Diabetes Paternal Grandmother    Liver disease Paternal Aunt    Heart disease Maternal Grandfather     Social History:  Social History   Tobacco Use   Smoking status: Never    Passive exposure: Past   Smokeless tobacco: Never  Vaping Use   Vaping Use: Never used  Substance Use Topics   Alcohol use: No   Drug use: No    ROS: Constitutional:  Negative for fever, chills, weight loss CV: Negative for chest pain, previous MI, hypertension Respiratory:  Negative for shortness of breath, wheezing, sleep apnea, frequent cough GI:  Negative for nausea, vomiting, bloody stool, GERD  Physical exam: BP 119/79   Pulse 80   Temp 97.9 F (36.6 C) (Oral)   Ht '5\' 2"'$  (1.575 m)   Wt 211 lb (95.7 kg)   BMI 38.59 kg/m  GENERAL APPEARANCE:  Well appearing, well developed, well nourished, NAD HEENT:  Atraumatic, normocephalic, oropharynx clear NECK:  Supple without lymphadenopathy or thyromegaly ABDOMEN:  Soft, non-tender, no masses EXTREMITIES:  Moves all extremities well, without clubbing, cyanosis, or edema NEUROLOGIC:  Alert and oriented x 3, normal gait, CN II-XII grossly intact MENTAL STATUS:  appropriate BACK:  Non-tender to palpation, No CVAT SKIN:  Warm, dry, and intact  Results: U/A dipstick: 1+ LE, 1+ protein, 2+ blood

## 2022-11-25 NOTE — Progress Notes (Signed)
Message sent through my chart on my number or epic my chart when patient is ready to schedule.

## 2022-11-25 NOTE — Progress Notes (Unsigned)
Surgical Physician Order Form [facility]  * Scheduling expectation :  Patient will call to schedule  *Length of Case: 60 minutes  *MD Preforming Case: Michaelle Birks, MD  *Assistant Needed: no  *Facility Preference:  [facility]  *Clearance needed: no  *Anticoagulation Instructions: N/A  *Aspirin Instructions: Hold Aspirin  -Admit type: OUTpatient  -Anesthesia: Local  -Use Standing Orders: ESWL  *Diagnosis: Right Nephrolithiasis  *Procedure: Right ESL  Additional orders: N/A  -Equipment:  N/A -VTE Prophylaxis Standing Order SCD's       Other:   -Standing Lab Orders Per Anesthesia    Lab other: KUB day of Procedure  -Standing Test orders EKG/Chest x-ray per Anesthesia       Test other:   - Medications:   none  -Other orders:  N/A  *Post-op visit Date/Instructions:  1 week cysto stent removal

## 2022-11-25 NOTE — Progress Notes (Signed)
Assessment: 1. Ureteral calculus, right   2. Nephrolithiasis     Plan: I personally viewed the KUB study from this morning.  This shows a right ureteral stent in good position.  A 4 x 5 mm faint calcification is seen just lateral to proximal stent consistent with known calculus. Options for management of the right renal calculus discussed including removal of stent with spontaneous passage, shockwave lithotripsy, or ureteroscopic stone manipulation.  She is interested in proceeding with shockwave lithotripsy. She would like to consult with general surgery tomorrow regarding her cholelithiasis prior to scheduling treatment for her renal calculus.  Procedure: The patient will be scheduled for Right ESL at [facility].  Surgical request is placed with the surgery schedulers and will be scheduled at the patient's/family request. Informed consent is given as documented below. Anesthesia:  local  The patient does not have sleep apnea, history of MRSA, history of VRE, history of cardiac device requiring special anesthetic needs. Patient is stable and considered clear for surgical in an outpatient ambulatory surgery setting as well as patient hospital setting.  Consent for Operation or Procedure: Provider Certification I hereby certify that the nature, purpose, benefits, usual and most frequent risks of, and alternatives to, the operation or procedure have been explained to the patient (or person authorized to sign for the patient) either by me as responsible physician or by the provider who is to perform the operation or procedure. Time spent such that the patient/family has had an opportunity to ask questions, and that those questions have been answered. The patient or the patient's representative has been advised that selected tasks may be performed by assistants to the primary health care provider(s). I believe that the patient (or person authorized to sign for the patient) understands what has  been explained, and has consented to the operation or procedure. No guarantees were implied or made.   Chief Complaint:  Chief Complaint  Patient presents with   ureteral calculus    History of Present Illness:  [name] is a 52 y.o. female who is seen for further evaluation of a right ureteral calculus and nephrolithiasis.  She has a history of nephrolithiasis and has previously been treated with a right ureteral stent followed by lithotripsy.  She had a right ureteral stent placed while in [place] for a 9 x 7 mm calculus at the right UPJ with associated mild hydronephrosis.  She underwent right ESL on 08/04/2022 and her stent was removed on 08/12/2022.  Posttreatment KUB from 08/25/2022 showed no obvious calcifications in the area of the right renal shadow or along the expected course of the right ureter and a left renal calculus.   She presented to the emergency room on 11/13/2022 with right-sided pain.  No fevers or chills.  She was having some associated nausea without vomiting.  Evaluation in the emergency room demonstrated a elevated white blood cell count to 12 K.  Renal function was normal. Urinalysis showed 0-5 RBCs, 6-10 WBCs and rare bacteria.  CT renal stone study showed a 4 mm calculus in the proximal right ureter with mild right-sided hydroureter and adjacent 2 mm and 3 mm nonobstructing calculi in the mid left kidney.  She  had continued right-sided flank pain and nausea despite taking pain medication and antiemetics.  She underwent cystoscopy, right retrograde pyelogram, attempted right uteroscopy, and insertion of a right ureteral stent on 11/17/2022.  Unfortunately, the flexible ureteroscope was not able to pass into the proximal ureter easily.  She  returns today for scheduled follow-up.  She continues to have some discomfort in the right flank area.  She has also had some pain in the right shoulder area.  She has nausea without vomiting.  She is not currently taking any  pain medication.  She continues on her antibiotics and tamsulosin.  She is voiding without difficulty.  No dysuria or gross hematuria.  She is scheduled to see general surgery tomorrow for evaluation of her cholelithiasis.  Portions of the above documentation were copied from a prior visit for review purposes only.   Past Medical History:  Past Medical History:  Diagnosis Date   ADHD (attention deficit hyperactivity disorder)    Follows w/ PCP, Dr. Jenna Luo, [place] 07/07/22.   Anxiety    Follows w/ PCP, Dr. Jenna Luo, [place] 07/07/22.   Asthma    Follows w/ PCP, Dr. Jenna Luo, [place] 07/07/22.   Back pain    low back pain with right-sided sciatica   Chest pain    Chronic CHF [facility])    Follows w/ cardiologist Dr. Grayland Jack, see most recent [facility] on 05/08/22 in [place].   Coronary artery disease    non-obstructive, cardiologist Dr. Grayland Jack, [place] 05/08/22. 02/13/22 CT Coronary in Epic, 05/30/21 echo in [place], 06/04/22 EKG in [place], 08/18/18 heart cath in [place], [place] w/ cardiology 05/08/22   Does use hearing aid    Dyspnea    resolved per pt as of 11/17/2022   Echocardiogram 10/25/2015   EF 60-65, normal wall motion, grade 1 diastolic dysfunction, GLS -19.1%   Edema of left lower extremity    Fracture dislocation of elbow joint 01/15/2013   right   GERD (gastroesophageal reflux disease)    Omeprazole as needed, none in 6 mos.   History of echocardiogram 05/30/2021   EF 65 - 70%   History of kidney stones    Hyperlipidemia    Follows w/ PCP, Dr. Jenna Luo @ [place].   Hypertension    Follows w/ PCP, Dr. Vernell [facility]. See OV dated 07/07/2022.   Joint pain    Obesity    Follows with Dr. Dennard Nip at [facility].   Palpitations    Cardiologist, Dr. Grayland Jack.   Pre-diabetes    Follows with [facility], Dr. Dennard Nip, MD, [place] 09/16/22 in Epic.   R/L Cardiac catheterization 08/18/2018    Normal coronary arteries, mean RA 6, mean PA 17, PCWP 10   Seborrheic dermatitis    Sleep apnea    has not been using CPAP for 2 or 3 months per pt on 11/17/22, new one is ordered   Vitamin D deficiency    Wears glasses     Past Surgical History:  Past Surgical History:  Procedure Laterality Date   [place]; 10/10/2000   x 2   CYSTOSCOPY WITH RETROGRADE PYELOGRAM, URETEROSCOPY AND STENT PLACEMENT Right 11/18/2022   Procedure: CYSTOSCOPY WITH RETROGRADE PYELOGRAM AND STENT PLACEMENT;  Surgeon: Primus Bravo., MD;  Location: [facility];  Service: Urology;  Laterality: Right;   EXTRACORPOREAL SHOCK WAVE LITHOTRIPSY Right 08/04/2022   Procedure: EXTRACORPOREAL SHOCK WAVE LITHOTRIPSY (ESWL);  Surgeon: Cleon Gustin, MD;  Location: [facility];  Service: Urology;  Laterality: Right;   HOLMIUM LASER APPLICATION Right 05/23/2422   Procedure: HOLMIUM LASER APPLICATION/aborted;  Surgeon: Primus Bravo., MD;  Location: [facility];  Service: Urology;  Laterality: Right;   KNEE  ARTHROSCOPY Left 1991   also in 2019   [place]  11/02/2006   LAPAROSCOPIC LYSIS OF ADHESIONS  03/06/2005   RADIAL HEAD ARTHROPLASTY Right 01/17/2013   Procedure: RIGHT RADIAL HEAD ARTHROPLASTY OPEN REDUCTION INTERNAL FIXATION CORONOID ;  Surgeon: Tennis Must, MD;  Location: [facility];  Service: Orthopedics;  Laterality: Right;   RIGHT/LEFT HEART CATH AND CORONARY ANGIOGRAPHY N/A 08/18/2018   Procedure: RIGHT/LEFT HEART CATH AND CORONARY ANGIOGRAPHY;  Surgeon: Nelva Bush, MD;  Location: [facility];  Service: Cardiovascular;  Laterality: N/A;   TUBAL LIGATION  03/06/2005    Allergies:  Allergies  Allergen Reactions   Adhesive [Tape] Rash   Latex Rash    Family History:  Family History  Problem Relation Age of Onset   Diabetes Sister    Heart disease Father 64       MI--1st Dx CAD. Died2that time    Sudden death Father    Diabetes Mother    Hypertension Mother    Heart disease Mother    Obesity Mother    Heart disease Maternal Uncle    Ovarian cancer Maternal Grandmother    Diabetes Maternal Grandmother    Heart disease Maternal Grandmother        Great Grandmother   Diabetes Paternal Grandmother    Liver disease Paternal Aunt    Heart disease Maternal Grandfather     Social History:  Social History   Tobacco Use   Smoking status: Never    Passive exposure: Past   Smokeless tobacco: Never  Vaping Use   Vaping Use: Never used  Substance Use Topics   Alcohol use: No   Drug use: No    ROS: Constitutional:  Negative for fever, chills, weight loss CV: Negative for chest pain, previous MI, hypertension Respiratory:  Negative for shortness of breath, wheezing, sleep apnea, frequent cough GI:  Negative for nausea, vomiting, bloody stool, GERD  Physical exam: BP 119/79   Pulse 80   Temp 97.9 F (36.6 C) (Oral)   Ht '5\' 2"'$  (1.575 m)   Wt 211 lb (95.7 kg)   BMI 38.59 kg/m  GENERAL APPEARANCE:  Well appearing, well developed, well nourished, NAD HEENT:  Atraumatic, normocephalic, oropharynx clear NECK:  Supple without lymphadenopathy or thyromegaly ABDOMEN:  Soft, non-tender, no masses EXTREMITIES:  Moves all extremities well, without clubbing, cyanosis, or edema NEUROLOGIC:  Alert and oriented x 3, normal gait, CN II-XII grossly intact MENTAL STATUS:  appropriate BACK:  Non-tender to palpation, No CVAT SKIN:  Warm, dry, and intact  Results: U/A dipstick: 1+ LE, 1+ protein, 2+ blood

## 2022-11-26 ENCOUNTER — Ambulatory Visit (INDEPENDENT_AMBULATORY_CARE_PROVIDER_SITE_OTHER): Payer: Commercial Managed Care - PPO | Admitting: Surgery

## 2022-11-26 ENCOUNTER — Encounter: Payer: Self-pay | Admitting: Surgery

## 2022-11-26 VITALS — BP 138/80 | HR 80 | Temp 97.9°F | Resp 12 | Ht 62.0 in | Wt 213.0 lb

## 2022-11-26 DIAGNOSIS — K8020 Calculus of gallbladder without cholecystitis without obstruction: Secondary | ICD-10-CM | POA: Diagnosis not present

## 2022-11-26 NOTE — Progress Notes (Signed)
[facility] History and Physical  Reason for Referral: Gallstones Referring Physician: Dr. Dennard Schaumann  Chief Complaint   New Patient (Initial Visit)     [name] is a 52 y.o. female.  HPI: Patient presents for evaluation of gallstones.  She presented to the emergency department on 12/29 for persistent right-sided back pain thought to be secondary to kidney stones.  She was also incidentally noted to have gallstones without inflammatory changes.  For the last few months, she has had right upper quadrant, right flank, and right shoulder pain.  The attacks are mostly random, but are sometimes associated with food.  She will also have random episodes of nausea and vomiting and decreased appetite.  She does confirm having loose bowel movements recently.  She denies any fevers or chills.  She has a history significant for kidney stones, and thought that most of her symptoms were related to her kidney stones.  Her other medical history includes CHF, asthma, prediabetes, and obstructive sleep apnea.  Her surgical history is significant for 2 C-sections, tubal ligation, and laparoscopic assisted vaginal hysterectomy.  She confirms taking an 81 mg aspirin daily.  She denies use of tobacco products, alcohol, and illicit drugs.  Past Medical History:  Diagnosis Date   ADHD (attention deficit hyperactivity disorder)    Follows w/ PCP, Dr. Jenna Luo, [place] 07/07/22.   Anxiety    Follows w/ PCP, Dr. Jenna Luo, [place] 07/07/22.   Asthma    Follows w/ PCP, Dr. Jenna Luo, [place] 07/07/22.   Back pain    low back pain with right-sided sciatica   Chest pain    Chronic CHF [facility])    Follows w/ cardiologist Dr. Grayland Jack, see most recent [facility] on 05/08/22 in [place].   Coronary artery disease    non-obstructive, cardiologist Dr. Grayland Jack, [place] 05/08/22. 02/13/22 CT Coronary in Epic, 05/30/21 echo in [place], 06/04/22 EKG in [place], 08/18/18 heart cath in [place], [place] w/ cardiology 05/08/22    Does use hearing aid    Dyspnea    resolved per pt as of 11/17/2022   Echocardiogram 10/25/2015   EF 60-65, normal wall motion, grade 1 diastolic dysfunction, GLS -19.1%   Edema of left lower extremity    Fracture dislocation of elbow joint 01/15/2013   right   GERD (gastroesophageal reflux disease)    Omeprazole as needed, none in 6 mos.   History of echocardiogram 05/30/2021   EF 65 - 70%   History of kidney stones    Hyperlipidemia    Follows w/ PCP, Dr. Jenna Luo @ [place].   Hypertension    Follows w/ PCP, Dr. Vernell [facility]. See OV dated 07/07/2022.   Joint pain    Obesity    Follows with Dr. Dennard Nip at [facility].   Palpitations    Cardiologist, Dr. Grayland Jack.   Pre-diabetes    Follows with [facility], Dr. Dennard Nip, MD, [place] 09/16/22 in Epic.   R/L Cardiac catheterization 08/18/2018   Normal coronary arteries, mean RA 6, mean PA 17, PCWP 10   Seborrheic dermatitis    Sleep apnea    has not been using CPAP for 2 or 3 months per pt on 11/17/22, new one is ordered   Vitamin D deficiency    Wears glasses     Past Surgical History:  Procedure Laterality Date   [place]; 10/10/2000   x 2  CYSTOSCOPY WITH RETROGRADE PYELOGRAM, URETEROSCOPY AND STENT PLACEMENT Right 11/18/2022   Procedure: CYSTOSCOPY WITH RETROGRADE PYELOGRAM AND STENT PLACEMENT;  Surgeon: Primus Bravo., MD;  Location: [facility];  Service: Urology;  Laterality: Right;   EXTRACORPOREAL SHOCK WAVE LITHOTRIPSY Right 08/04/2022   Procedure: EXTRACORPOREAL SHOCK WAVE LITHOTRIPSY (ESWL);  Surgeon: Cleon Gustin, MD;  Location: [facility];  Service: Urology;  Laterality: Right;   HOLMIUM LASER APPLICATION Right 8/0/8811   Procedure: HOLMIUM LASER APPLICATION/aborted;  Surgeon: Primus Bravo., MD;  Location: [facility];  Service: Urology;  Laterality:  Right;   KNEE ARTHROSCOPY Left 1991   also in 2019   LAPAROSCOPIC ASSISTED VAGINAL HYSTERECTOMY  11/02/2006   LAPAROSCOPIC LYSIS OF ADHESIONS  03/06/2005   RADIAL HEAD ARTHROPLASTY Right 01/17/2013   Procedure: RIGHT RADIAL HEAD ARTHROPLASTY OPEN REDUCTION INTERNAL FIXATION CORONOID ;  Surgeon: Tennis Must, MD;  Location: [facility];  Service: Orthopedics;  Laterality: Right;   RIGHT/LEFT HEART CATH AND CORONARY ANGIOGRAPHY N/A 08/18/2018   Procedure: RIGHT/LEFT HEART CATH AND CORONARY ANGIOGRAPHY;  Surgeon: Nelva Bush, MD;  Location: [facility];  Service: Cardiovascular;  Laterality: N/A;   TUBAL LIGATION  03/06/2005    Family History  Problem Relation Age of Onset   Diabetes Sister    Heart disease Father 76       MI--1st Dx CAD. Died2that time   Sudden death Father    Diabetes Mother    Hypertension Mother    Heart disease Mother    Obesity Mother    Heart disease Maternal Uncle    Ovarian cancer Maternal Grandmother    Diabetes Maternal Grandmother    Heart disease Maternal Grandmother        Great Grandmother   Diabetes Paternal Grandmother    Liver disease Paternal Aunt    Heart disease Maternal Grandfather     Social History   Tobacco Use   Smoking status: Never    Passive exposure: Past   Smokeless tobacco: Never  Vaping Use   Vaping Use: Never used  Substance Use Topics   Alcohol use: No   Drug use: No    Medications: I have reviewed the patient's current medications. Allergies as of 11/26/2022       Reactions   Adhesive [tape] Rash   Latex Rash        Medication List        Accurate as of November 26, 2022 11:03 AM. If you have any questions, ask your nurse or doctor.          albuterol 108 (90 Base) MCG/ACT inhaler Commonly known as: VENTOLIN HFA Inhale 2 puffs into the lungs every 6 (six) hours as needed for wheezing.   ALPRAZolam 0.5 MG tablet Commonly known as: Xanax Take 1 tablet by mouth 3 times daily as  needed.   aspirin EC 81 MG tablet Take 1 tablet (81 mg total) by mouth daily. Swallow whole.   atomoxetine 40 MG capsule Commonly known as: Strattera Take 1 capsule (40 mg total) by mouth daily.   cefdinir 300 MG capsule Commonly known as: OMNICEF Take 1 capsule (300 mg total) by mouth 2 (two) times daily for 7 days.   cyclobenzaprine 10 MG tablet Commonly known as: FLEXERIL Take 1 tablet (10 mg total) by mouth 3 (three) times daily as needed for muscle spasms.   Entresto 24-26 MG Generic drug: sacubitril-valsartan Take 1 tablet by mouth 2 (two) times daily.  ibuprofen 200 MG tablet Commonly known as: ADVIL Take 400 mg by mouth 2 (two) times daily as needed for headache or moderate pain.   metFORMIN 500 MG tablet Commonly known as: GLUCOPHAGE Take 1 tablet (500 mg total) by mouth in the morning, at noon, and at bedtime. What changed: when to take this   metoprolol tartrate 50 MG tablet Commonly known as: LOPRESSOR Take 1 tablet (50 mg total) by mouth 2 (two) times daily.   multivitamin with minerals Tabs tablet Take 1 tablet by mouth daily.   nitroGLYCERIN 0.4 MG SL tablet Commonly known as: NITROSTAT Place 1 tablet (0.4 mg total) under the tongue every 5 (five) minutes as needed for chest pain.   ondansetron 4 MG disintegrating tablet Commonly known as: ZOFRAN-ODT Dissolve 1 tablet (4 mg total) by mouth every 8 (eight) hours as needed for nausea or vomiting.   ondansetron 4 MG tablet Commonly known as: Zofran Take 1 tablet (4 mg total) by mouth every 8 (eight) hours as needed for nausea or vomiting.   oxyCODONE-acetaminophen 5-325 MG tablet Commonly known as: PERCOCET/ROXICET Take 1 tablet by mouth every 4 (four) hours as needed for severe pain.   phenazopyridine 200 MG tablet Commonly known as: Pyridium Take 1 tablet (200 mg total) by mouth 3 (three) times daily as needed for pain.   rosuvastatin 20 MG tablet Commonly known as: CRESTOR Take 1 tablet  by  mouth daily.   spironolactone 25 MG tablet Commonly known as: ALDACTONE Take 1 tablet (25 mg total) by mouth 3 (three) times a week.   tamsulosin 0.4 MG Caps capsule Commonly known as: Flomax Take 1 capsule (0.4 mg total) by mouth daily.         ROS:  Constitutional: negative for chills, fatigue, and fevers Eyes: negative for visual disturbance and pain Ears, nose, mouth, throat, and face: negative for ear drainage, sore throat, and sinus problems Respiratory: negative for cough, wheezing, and shortness of breath Cardiovascular: negative for chest pain Gastrointestinal: positive for nausea, negative for abdominal pain, reflux symptoms, and vomiting Genitourinary:negative for dysuria and frequency Integument/breast: negative for dryness and rash Hematologic/lymphatic: negative for bleeding and lymphadenopathy Musculoskeletal:positive for back pain, negative for neck pain Neurological: negative for dizziness and tremors Endocrine: negative for temperature intolerance  Blood pressure 138/80, pulse 80, temperature 97.9 F (36.6 C), temperature source Oral, resp. rate 12, height '5\' 2"'$  (1.575 m), weight 213 lb (96.6 kg), SpO2 92 %. Physical Exam Vitals reviewed.  Constitutional:      Appearance: Normal appearance.  HENT:     Head: Normocephalic and atraumatic.  Eyes:     Extraocular Movements: Extraocular movements intact.     Pupils: Pupils are equal, round, and reactive to light.  Cardiovascular:     Rate and Rhythm: Normal rate and regular rhythm.  Pulmonary:     Effort: Pulmonary effort is normal.     Breath sounds: Normal breath sounds.  Abdominal:     Comments: Abdomen soft, nondistended, no percussion tenderness, mild tenderness to palpation epigastrium and right upper quadrant; no rigidity, guarding, rebound tenderness; negative Murphy sign  Musculoskeletal:        General: Normal range of motion.     Cervical back: Normal range of motion.  Skin:    General: Skin  is warm and dry.  Neurological:     General: No focal deficit present.     Mental Status: She is alert and oriented to person, place, and time.  Psychiatric:  Mood and Affect: Mood normal.        Behavior: Behavior normal.     Results: Results for orders placed or performed in visit on 11/25/22 (from the past 48 hour(s))  Urinalysis     Status: Abnormal   Collection Time: 11/25/22 12:00 AM  Result Value Ref Range   Glucose, UA negative negative mg/dL   Bilirubin, UA negative negative   Ketones, POC UA negative negative mg/dL   Spec Grav, UA 1.020 1.010 - 1.025   Blood, UA moderate (A) negative    Comment: 2+   pH, UA 6.5 5.0 - 8.0   Protein Ur, POC =30 (A) negative mg/dL    Comment: 1+   Urobilinogen, UA 0.2 0.2 or 1.0 E.U./dL   Nitrite, UA Negative Negative   Leukocytes, UA Small (1+) (A) Negative    DG Abd 1 View  Result Date: 11/25/2022 CLINICAL DATA:  Status post surgery. Ureteral stent placed 1 week ago. EXAM: ABDOMEN - 1 VIEW COMPARISON:  CT abdomen pelvis 11/14/2022 FINDINGS: There is a new right nephroureteral stent with proximal pigtail in the expected region of the right renal pelvis and distal pigtail in the region of the midline urinary bladder. The previously seen 11/14/2022 proximal right ureteral stone at the L3 vertebral body level on prior CT is not visualized. There is an approximate 8 mm calcific density overlying the left fourth rib in the inferior pole of the left kidney that appears to be in the same position as a nonobstructing stone of similar size seen on prior 11/14/2022 CT. Vascular phleboliths overlie the midline pelvis as on prior CT. Nonobstructive bowel-gas pattern.  Normal regional bones. IMPRESSION: New right nephroureteral stent with proximal pigtail in appropriate position. The previously seen proximal right ureteral stone at the L3 vertebral body level on prior CT is no longer visualized. Electronically Signed   By: Yvonne Kendall M.D.   On:  11/25/2022 11:06    CT abdomen and pelvis (11/14/2022): IMPRESSION: 1. 4 mm proximal right ureteral calculus with mild right-sided hydroureter. 2. Adjacent 2 mm and 3 mm nonobstructing left renal calculi. 3. Cholelithiasis.  Assessment & Plan:  [name] is a 52 y.o. female who presents for evaluation of cholelithiasis.  -I explained the pathophysiology of gallbladder disease, and why we recommend surgical removal of the gallbladder -I counseled the patient about the indication, risks and benefits of robotic assisted laparoscopic cholecystectomy.  She understands there is a very small chance for bleeding, infection, injury to normal structures (including common bile duct), conversion to open surgery, persistent symptoms, evolution of postcholecystectomy diarrhea, need for secondary interventions, anesthesia reaction, cardiopulmonary issues and other risks not specifically detailed here. I described the expected recovery, the plan for follow-up and the restrictions during the recovery phase.  All questions were answered. -Patient tentatively scheduled for surgery on 2/8 -Information provided to the patient regarding cholelithiasis, cholecystectomy, and low-fat diet -Advised her to present to the ED if she begins to have worsening right upper quadrant abdominal pain, nausea, vomiting, fevers, and chills  All questions were answered to the satisfaction of the patient.   Graciella Freer, DO [facility] [address] [place], [place] [address] [phone] (office)

## 2022-11-27 ENCOUNTER — Encounter: Payer: Self-pay | Admitting: Urology

## 2022-11-30 NOTE — H&P (Signed)
[facility] History and Physical  Reason for Referral: Gallstones Referring Physician: Dr. Dennard Schaumann  Chief Complaint   New Patient (Initial Visit)     [name] is a 52 y.o. female.  HPI: Patient presents for evaluation of gallstones.  She presented to the emergency department on 12/29 for persistent right-sided back pain thought to be secondary to kidney stones.  She was also incidentally noted to have gallstones without inflammatory changes.  For the last few months, she has had right upper quadrant, right flank, and right shoulder pain.  The attacks are mostly random, but are sometimes associated with food.  She will also have random episodes of nausea and vomiting and decreased appetite.  She does confirm having loose bowel movements recently.  She denies any fevers or chills.  She has a history significant for kidney stones, and thought that most of her symptoms were related to her kidney stones.  Her other medical history includes CHF, asthma, prediabetes, and obstructive sleep apnea.  Her surgical history is significant for 2 C-sections, tubal ligation, and laparoscopic assisted vaginal hysterectomy.  She confirms taking an 81 mg aspirin daily.  She denies use of tobacco products, alcohol, and illicit drugs.  Past Medical History:  Diagnosis Date   ADHD (attention deficit hyperactivity disorder)    Follows w/ PCP, Dr. Jenna Luo, [place] 07/07/22.   Anxiety    Follows w/ PCP, Dr. Jenna Luo, [place] 07/07/22.   Asthma    Follows w/ PCP, Dr. Jenna Luo, [place] 07/07/22.   Back pain    low back pain with right-sided sciatica   Chest pain    Chronic CHF [facility])    Follows w/ cardiologist Dr. Grayland Jack, see most recent [place] on 05/08/22 in [place].   Coronary artery disease    non-obstructive, cardiologist Dr. Grayland Jack, [place] 05/08/22. 02/13/22 CT Coronary in Epic, 05/30/21 echo in [place], 06/04/22 EKG in [place], 08/18/18 heart cath in [place], [place] w/ cardiology 05/08/22    Does use hearing aid    Dyspnea    resolved per pt as of 11/17/2022   Echocardiogram 10/25/2015   EF 60-65, normal wall motion, grade 1 diastolic dysfunction, GLS -19.1%   Edema of left lower extremity    Fracture dislocation of elbow joint 01/15/2013   right   GERD (gastroesophageal reflux disease)    Omeprazole as needed, none in 6 mos.   History of echocardiogram 05/30/2021   EF 65 - 70%   History of kidney stones    Hyperlipidemia    Follows w/ PCP, Dr. Jenna Luo @ [place].   Hypertension    Follows w/ PCP, Dr. Vernell Barrier [facility]. See OV dated 07/07/2022.   Joint pain    Obesity    Follows with Dr. Dennard Nip at [facility].   Palpitations    Cardiologist, Dr. Grayland Jack.   Pre-diabetes    Follows with [facility], Dr. Dennard Nip, MD, [place] 09/16/22 in Epic.   R/L Cardiac catheterization 08/18/2018   Normal coronary arteries, mean RA 6, mean PA 17, PCWP 10   Seborrheic dermatitis    Sleep apnea    has not been using CPAP for 2 or 3 months per pt on 11/17/22, new one is ordered   Vitamin D deficiency    Wears glasses     Past Surgical History:  Procedure Laterality Date   [place]; 10/10/2000   x 2  CYSTOSCOPY WITH RETROGRADE PYELOGRAM, URETEROSCOPY AND STENT PLACEMENT Right 11/18/2022   Procedure: CYSTOSCOPY WITH RETROGRADE PYELOGRAM AND STENT PLACEMENT;  Surgeon: Primus Bravo., MD;  Location: [facility];  Service: Urology;  Laterality: Right;   EXTRACORPOREAL SHOCK WAVE LITHOTRIPSY Right 08/04/2022   Procedure: EXTRACORPOREAL SHOCK WAVE LITHOTRIPSY (ESWL);  Surgeon: Cleon Gustin, MD;  Location: [facility];  Service: Urology;  Laterality: Right;   HOLMIUM LASER APPLICATION Right 01/16/3556   Procedure: HOLMIUM LASER APPLICATION/aborted;  Surgeon: Primus Bravo., MD;  Location: [facility];  Service: Urology;  Laterality:  Right;   KNEE ARTHROSCOPY Left 1991   also in 2019   LAPAROSCOPIC ASSISTED VAGINAL HYSTERECTOMY  11/02/2006   LAPAROSCOPIC LYSIS OF ADHESIONS  03/06/2005   RADIAL HEAD ARTHROPLASTY Right 01/17/2013   Procedure: RIGHT RADIAL HEAD ARTHROPLASTY OPEN REDUCTION INTERNAL FIXATION CORONOID ;  Surgeon: Tennis Must, MD;  Location: [place];  Service: Orthopedics;  Laterality: Right;   RIGHT/LEFT HEART CATH AND CORONARY ANGIOGRAPHY N/A 08/18/2018   Procedure: RIGHT/LEFT HEART CATH AND CORONARY ANGIOGRAPHY;  Surgeon: Nelva Bush, MD;  Location: [place] [facility];  Service: Cardiovascular;  Laterality: N/A;   TUBAL LIGATION  03/06/2005    Family History  Problem Relation Age of Onset   Diabetes Sister    Heart disease Father 3       MI--1st Dx CAD. Died2that time   Sudden death Father    Diabetes Mother    Hypertension Mother    Heart disease Mother    Obesity Mother    Heart disease Maternal Uncle    Ovarian cancer Maternal Grandmother    Diabetes Maternal Grandmother    Heart disease Maternal Grandmother        Great Grandmother   Diabetes Paternal Grandmother    Liver disease Paternal Aunt    Heart disease Maternal Grandfather     Social History   Tobacco Use   Smoking status: Never    Passive exposure: Past   Smokeless tobacco: Never  Vaping Use   Vaping Use: Never used  Substance Use Topics   Alcohol use: No   Drug use: No    Medications: I have reviewed the patient's current medications. Allergies as of 11/26/2022       Reactions   Adhesive [tape] Rash   Latex Rash        Medication List        Accurate as of November 26, 2022 11:03 AM. If you have any questions, ask your nurse or doctor.          albuterol 108 (90 Base) MCG/ACT inhaler Commonly known as: VENTOLIN HFA Inhale 2 puffs into the lungs every 6 (six) hours as needed for wheezing.   ALPRAZolam 0.5 MG tablet Commonly known as: Xanax Take 1 tablet by mouth 3 times daily as  needed.   aspirin EC 81 MG tablet Take 1 tablet (81 mg total) by mouth daily. Swallow whole.   atomoxetine 40 MG capsule Commonly known as: Strattera Take 1 capsule (40 mg total) by mouth daily.   cefdinir 300 MG capsule Commonly known as: OMNICEF Take 1 capsule (300 mg total) by mouth 2 (two) times daily for 7 days.   cyclobenzaprine 10 MG tablet Commonly known as: FLEXERIL Take 1 tablet (10 mg total) by mouth 3 (three) times daily as needed for muscle spasms.   Entresto 24-26 MG Generic drug: sacubitril-valsartan Take 1 tablet by mouth 2 (two) times daily.  ibuprofen 200 MG tablet Commonly known as: ADVIL Take 400 mg by mouth 2 (two) times daily as needed for headache or moderate pain.   metFORMIN 500 MG tablet Commonly known as: GLUCOPHAGE Take 1 tablet (500 mg total) by mouth in the morning, at noon, and at bedtime. What changed: when to take this   metoprolol tartrate 50 MG tablet Commonly known as: LOPRESSOR Take 1 tablet (50 mg total) by mouth 2 (two) times daily.   multivitamin with minerals Tabs tablet Take 1 tablet by mouth daily.   nitroGLYCERIN 0.4 MG SL tablet Commonly known as: NITROSTAT Place 1 tablet (0.4 mg total) under the tongue every 5 (five) minutes as needed for chest pain.   ondansetron 4 MG disintegrating tablet Commonly known as: ZOFRAN-ODT Dissolve 1 tablet (4 mg total) by mouth every 8 (eight) hours as needed for nausea or vomiting.   ondansetron 4 MG tablet Commonly known as: Zofran Take 1 tablet (4 mg total) by mouth every 8 (eight) hours as needed for nausea or vomiting.   oxyCODONE-acetaminophen 5-325 MG tablet Commonly known as: PERCOCET/ROXICET Take 1 tablet by mouth every 4 (four) hours as needed for severe pain.   phenazopyridine 200 MG tablet Commonly known as: Pyridium Take 1 tablet (200 mg total) by mouth 3 (three) times daily as needed for pain.   rosuvastatin 20 MG tablet Commonly known as: CRESTOR Take 1 tablet  by  mouth daily.   spironolactone 25 MG tablet Commonly known as: ALDACTONE Take 1 tablet (25 mg total) by mouth 3 (three) times a week.   tamsulosin 0.4 MG Caps capsule Commonly known as: Flomax Take 1 capsule (0.4 mg total) by mouth daily.         ROS:  Constitutional: negative for chills, fatigue, and fevers Eyes: negative for visual disturbance and pain Ears, nose, mouth, throat, and face: negative for ear drainage, sore throat, and sinus problems Respiratory: negative for cough, wheezing, and shortness of breath Cardiovascular: negative for chest pain Gastrointestinal: positive for nausea, negative for abdominal pain, reflux symptoms, and vomiting Genitourinary:negative for dysuria and frequency Integument/breast: negative for dryness and rash Hematologic/lymphatic: negative for bleeding and lymphadenopathy Musculoskeletal:positive for back pain, negative for neck pain Neurological: negative for dizziness and tremors Endocrine: negative for temperature intolerance  Blood pressure 138/80, pulse 80, temperature 97.9 F (36.6 C), temperature source Oral, resp. rate 12, height '5\' 2"'$  (1.575 m), weight 213 lb (96.6 kg), SpO2 92 %. Physical Exam Vitals reviewed.  Constitutional:      Appearance: Normal appearance.  HENT:     Head: Normocephalic and atraumatic.  Eyes:     Extraocular Movements: Extraocular movements intact.     Pupils: Pupils are equal, round, and reactive to light.  Cardiovascular:     Rate and Rhythm: Normal rate and regular rhythm.  Pulmonary:     Effort: Pulmonary effort is normal.     Breath sounds: Normal breath sounds.  Abdominal:     Comments: Abdomen soft, nondistended, no percussion tenderness, mild tenderness to palpation epigastrium and right upper quadrant; no rigidity, guarding, rebound tenderness; negative Murphy sign  Musculoskeletal:        General: Normal range of motion.     Cervical back: Normal range of motion.  Skin:    General: Skin  is warm and dry.  Neurological:     General: No focal deficit present.     Mental Status: She is alert and oriented to person, place, and time.  Psychiatric:  Mood and Affect: Mood normal.        Behavior: Behavior normal.     Results: Results for orders placed or performed in visit on 11/25/22 (from the past 48 hour(s))  Urinalysis     Status: Abnormal   Collection Time: 11/25/22 12:00 AM  Result Value Ref Range   Glucose, UA negative negative mg/dL   Bilirubin, UA negative negative   Ketones, POC UA negative negative mg/dL   Spec Grav, UA 1.020 1.010 - 1.025   Blood, UA moderate (A) negative    Comment: 2+   pH, UA 6.5 5.0 - 8.0   Protein Ur, POC =30 (A) negative mg/dL    Comment: 1+   Urobilinogen, UA 0.2 0.2 or 1.0 E.U./dL   Nitrite, UA Negative Negative   Leukocytes, UA Small (1+) (A) Negative    DG Abd 1 View  Result Date: 11/25/2022 CLINICAL DATA:  Status post surgery. Ureteral stent placed 1 week ago. EXAM: ABDOMEN - 1 VIEW COMPARISON:  CT abdomen pelvis 11/14/2022 FINDINGS: There is a new right nephroureteral stent with proximal pigtail in the expected region of the right renal pelvis and distal pigtail in the region of the midline urinary bladder. The previously seen 11/14/2022 proximal right ureteral stone at the L3 vertebral body level on prior CT is not visualized. There is an approximate 8 mm calcific density overlying the left fourth rib in the inferior pole of the left kidney that appears to be in the same position as a nonobstructing stone of similar size seen on prior 11/14/2022 CT. Vascular phleboliths overlie the midline pelvis as on prior CT. Nonobstructive bowel-gas pattern.  Normal regional bones. IMPRESSION: New right nephroureteral stent with proximal pigtail in appropriate position. The previously seen proximal right ureteral stone at the L3 vertebral body level on prior CT is no longer visualized. Electronically Signed   By: Yvonne Kendall M.D.   On:  11/25/2022 11:06    CT abdomen and pelvis (11/14/2022): IMPRESSION: 1. 4 mm proximal right ureteral calculus with mild right-sided hydroureter. 2. Adjacent 2 mm and 3 mm nonobstructing left renal calculi. 3. Cholelithiasis.  Assessment & Plan:  THECLA FORGIONE is a 52 y.o. female who presents for evaluation of cholelithiasis.  -I explained the pathophysiology of gallbladder disease, and why we recommend surgical removal of the gallbladder -I counseled the patient about the indication, risks and benefits of robotic assisted laparoscopic cholecystectomy.  She understands there is a very small chance for bleeding, infection, injury to normal structures (including common bile duct), conversion to open surgery, persistent symptoms, evolution of postcholecystectomy diarrhea, need for secondary interventions, anesthesia reaction, cardiopulmonary issues and other risks not specifically detailed here. I described the expected recovery, the plan for follow-up and the restrictions during the recovery phase.  All questions were answered. -Patient tentatively scheduled for surgery on 2/8 -Information provided to the patient regarding cholelithiasis, cholecystectomy, and low-fat diet -Advised her to present to the ED if she begins to have worsening right upper quadrant abdominal pain, nausea, vomiting, fevers, and chills  All questions were answered to the satisfaction of the patient.   Graciella Freer, DO [facility] [address] [place], [place] [address] [phone] (office)

## 2022-12-03 NOTE — Progress Notes (Signed)
Pre op phone call completed.  Pt to arrive at 1145 on Monday. Pt instructed to bring CPAP machine. Aspirin stopped 12-03-22.

## 2022-12-07 ENCOUNTER — Ambulatory Visit (HOSPITAL_COMMUNITY): Payer: Commercial Managed Care - PPO

## 2022-12-07 ENCOUNTER — Encounter (HOSPITAL_BASED_OUTPATIENT_CLINIC_OR_DEPARTMENT_OTHER): Payer: Self-pay | Admitting: Anesthesiology

## 2022-12-07 ENCOUNTER — Encounter (HOSPITAL_BASED_OUTPATIENT_CLINIC_OR_DEPARTMENT_OTHER)
Admission: RE | Disposition: A | Discharge status: Home / Self Care | Payer: Self-pay | Source: Home / Self Care | Attending: Urology

## 2022-12-07 ENCOUNTER — Other Ambulatory Visit (HOSPITAL_COMMUNITY): Payer: Self-pay

## 2022-12-07 ENCOUNTER — Other Ambulatory Visit: Payer: Self-pay

## 2022-12-07 ENCOUNTER — Encounter (HOSPITAL_BASED_OUTPATIENT_CLINIC_OR_DEPARTMENT_OTHER): Payer: Self-pay | Admitting: Urology

## 2022-12-07 ENCOUNTER — Ambulatory Visit (HOSPITAL_BASED_OUTPATIENT_CLINIC_OR_DEPARTMENT_OTHER)
Admission: RE | Admit: 2022-12-07 | Discharge: 2022-12-07 | Disposition: A | Discharge status: Home / Self Care | Payer: Commercial Managed Care - PPO | Attending: Urology | Admitting: Urology

## 2022-12-07 DIAGNOSIS — N202 Calculus of kidney with calculus of ureter: Secondary | ICD-10-CM | POA: Insufficient documentation

## 2022-12-07 DIAGNOSIS — N200 Calculus of kidney: Secondary | ICD-10-CM

## 2022-12-07 DIAGNOSIS — N201 Calculus of ureter: Secondary | ICD-10-CM

## 2022-12-07 HISTORY — PX: EXTRACORPOREAL SHOCK WAVE LITHOTRIPSY: SHX1557

## 2022-12-07 SURGERY — LITHOTRIPSY, ESWL
Anesthesia: LOCAL | Laterality: Right

## 2022-12-07 MED ORDER — CIPROFLOXACIN HCL 500 MG PO TABS
ORAL_TABLET | ORAL | Status: AC
  Filled 2022-12-07: qty 1

## 2022-12-07 MED ORDER — CIPROFLOXACIN HCL 500 MG PO TABS
500.0000 mg | ORAL_TABLET | Freq: Once | ORAL | Status: AC
  Administered 2022-12-07: 500 mg via ORAL

## 2022-12-07 MED ORDER — SODIUM CHLORIDE 0.9 % IV SOLN: INTRAVENOUS | Status: DC

## 2022-12-07 MED ORDER — DIAZEPAM 5 MG PO TABS
ORAL_TABLET | ORAL | Status: AC
  Filled 2022-12-07: qty 2

## 2022-12-07 MED ORDER — DIPHENHYDRAMINE HCL 25 MG PO CAPS
25.0000 mg | ORAL_CAPSULE | ORAL | Status: AC
  Administered 2022-12-07: 25 mg via ORAL

## 2022-12-07 MED ORDER — DIAZEPAM 5 MG PO TABS
10.0000 mg | ORAL_TABLET | ORAL | Status: AC
  Administered 2022-12-07: 10 mg via ORAL

## 2022-12-07 MED ORDER — CEFDINIR 300 MG PO CAPS
300.0000 mg | ORAL_CAPSULE | Freq: Two times a day (BID) | ORAL | 0 refills | Status: AC
  Filled 2022-12-07: qty 14, 7d supply, fill #0

## 2022-12-07 MED ORDER — DIPHENHYDRAMINE HCL 25 MG PO CAPS
ORAL_CAPSULE | ORAL | Status: AC
  Filled 2022-12-07: qty 1

## 2022-12-07 NOTE — Discharge Instructions (Signed)
  Post Anesthesia Home Care Instructions  Activity: Get plenty of rest for the remainder of the day. A responsible individual must stay with you for 24 hours following the procedure.  For the next 24 hours, DO NOT: -Drive a car -Paediatric nurse -Drink alcoholic beverages -Take any medication unless instructed by your physician -Make any legal decisions or sign important papers.  Meals: Start with liquid foods such as gelatin or soup. Progress to regular foods as tolerated. Avoid greasy, spicy, heavy foods. If nausea and/or vomiting occur, drink only clear liquids until the nausea and/or vomiting subsides. Call your physician if vomiting continues.

## 2022-12-07 NOTE — Interval H&P Note (Signed)
History and Physical Interval Note:  12/07/2022 12:49 PM  [name]  has presented today for surgery, with the diagnosis of Right Nephrolithiasis.  The various methods of treatment have been discussed with the patient and family. After consideration of risks, benefits and other options for treatment, the patient has consented to  Procedure(s): EXTRACORPOREAL SHOCK WAVE LITHOTRIPSY (ESWL) (Right) as a surgical intervention.  The patient's history has been reviewed, patient examined, no change in status, stable for surgery.  I have reviewed the patient's chart and labs.  Questions were answered to the patient's satisfaction.     Michaelle Birks

## 2022-12-08 ENCOUNTER — Encounter (HOSPITAL_BASED_OUTPATIENT_CLINIC_OR_DEPARTMENT_OTHER): Payer: Self-pay | Admitting: Urology

## 2022-12-15 ENCOUNTER — Ambulatory Visit (INDEPENDENT_AMBULATORY_CARE_PROVIDER_SITE_OTHER): Payer: Commercial Managed Care - PPO | Admitting: Urology

## 2022-12-15 ENCOUNTER — Encounter: Payer: Self-pay | Admitting: Urology

## 2022-12-15 VITALS — BP 132/82 | HR 84 | Ht 62.0 in | Wt 211.0 lb

## 2022-12-15 DIAGNOSIS — Z87442 Personal history of urinary calculi: Secondary | ICD-10-CM | POA: Diagnosis not present

## 2022-12-15 DIAGNOSIS — N200 Calculus of kidney: Secondary | ICD-10-CM | POA: Diagnosis not present

## 2022-12-15 DIAGNOSIS — Z466 Encounter for fitting and adjustment of urinary device: Secondary | ICD-10-CM

## 2022-12-15 LAB — URINALYSIS
Bilirubin, UA: NEGATIVE
Glucose, UA: NEGATIVE mg/dL
Ketones, POC UA: NEGATIVE mg/dL
Nitrite, UA: NEGATIVE
Protein Ur, POC: 100 mg/dL — AB
Spec Grav, UA: 1.025 (ref 1.010–1.025)
Urobilinogen, UA: 0.2 E.U./dL
pH, UA: 5.5 (ref 5.0–8.0)

## 2022-12-15 MED ORDER — CIPROFLOXACIN HCL 500 MG PO TABS
500.0000 mg | ORAL_TABLET | Freq: Once | ORAL | Status: AC
  Administered 2022-12-15: 500 mg via ORAL

## 2022-12-15 NOTE — Progress Notes (Signed)
Assessment: 1. Nephrolithiasis     Plan: Right ureteral stent removed today Cipro x 1 following procedure Return to office in 1 month with KUB  Chief Complaint:  Chief Complaint  Patient presents with   Cysto Stent Removal    History of Present Illness:  [name] is a 52 y.o. female who is seen for further evaluation of a right ureteral calculus and nephrolithiasis.  She has a history of nephrolithiasis and has previously been treated with a right ureteral stent followed by lithotripsy.  She had a right ureteral stent placed while in [place] for a 9 x 7 mm calculus at the right UPJ with associated mild hydronephrosis.  She underwent right ESL on 08/04/2022 and her stent was removed on 08/12/2022.  Posttreatment KUB from 08/25/2022 showed no obvious calcifications in the area of the right renal shadow or along the expected course of the right ureter and a left renal calculus.   She presented to the emergency room on 11/13/2022 with right-sided pain.  No fevers or chills.  She was having some associated nausea without vomiting.  Evaluation in the emergency room demonstrated a elevated white blood cell count to 12 K.  Renal function was normal. Urinalysis showed 0-5 RBCs, 6-10 WBCs and rare bacteria.  CT renal stone study showed a 4 mm calculus in the proximal right ureter with mild right-sided hydroureter and adjacent 2 mm and 3 mm nonobstructing calculi in the mid left kidney.  She  had continued right-sided flank pain and nausea despite taking pain medication and antiemetics.  She underwent cystoscopy, right retrograde pyelogram, attempted right uteroscopy, and insertion of a right ureteral stent on 11/17/2022.  Unfortunately, the flexible ureteroscope was not able to pass into the proximal ureter easily. She is s/p right ESL on 12/07/22.  She is doing well following the procedure.  She is tolerating her stent.  She completed her antibiotics yesterday.  No dysuria or gross  hematuria.  No flank pain.  She presents today for cystoscopy and stent removal.   Portions of the above documentation were copied from a prior visit for review purposes only.   Past Medical History:  Past Medical History:  Diagnosis Date   ADHD (attention deficit hyperactivity disorder)    Follows w/ PCP, Dr. Jenna Luo, [place] 07/07/22.   Anxiety    Follows w/ PCP, Dr. Jenna Luo, [place] 07/07/22.   Asthma    Follows w/ PCP, Dr. Jenna Luo, [place] 07/07/22.   Back pain    low back pain with right-sided sciatica   Chest pain    Chronic CHF [facility])    Follows w/ cardiologist Dr. Grayland Jack, see most recent [place] on 05/08/22 in [place].   Coronary artery disease    non-obstructive, cardiologist Dr. Grayland Jack, [place] 05/08/22. 02/13/22 CT Coronary in Epic, 05/30/21 echo in [place], 06/04/22 EKG in [place], 08/18/18 heart cath in [place], [place] w/ cardiology 05/08/22   Does use hearing aid    Dyspnea    resolved per pt as of 11/17/2022   Echocardiogram 10/25/2015   EF 60-65, normal wall motion, grade 1 diastolic dysfunction, GLS -19.1%   Edema of left lower extremity    Fracture dislocation of elbow joint 01/15/2013   right   GERD (gastroesophageal reflux disease)    Omeprazole as needed, none in 6 mos.   History of echocardiogram 05/30/2021   EF 65 - 70%   History of kidney stones    Hyperlipidemia    Follows w/ PCP,  Dr. Jenna Luo @ [place].   Hypertension    Follows w/ PCP, Dr. Vernell Barrier [facility]. See OV dated 07/07/2022.   Joint pain    Obesity    Follows with Dr. Dennard Nip at [facility].   Palpitations    Cardiologist, Dr. Grayland Jack.   Pre-diabetes    Follows with [facility], Dr. Dennard Nip, [place], [place] 09/16/22 in Epic.   R/L Cardiac catheterization 08/18/2018   Normal coronary arteries, mean RA 6, mean PA 17, PCWP 10   Seborrheic dermatitis    Sleep apnea    has not been using CPAP for 2 or 3  months per pt on 11/17/22, new one is ordered   Vitamin D deficiency    Wears glasses     Past Surgical History:  Past Surgical History:  Procedure Laterality Date   [place]; 10/10/2000   x 2   CYSTOSCOPY WITH RETROGRADE PYELOGRAM, URETEROSCOPY AND STENT PLACEMENT Right 11/18/2022   Procedure: CYSTOSCOPY WITH RETROGRADE PYELOGRAM AND STENT PLACEMENT;  Surgeon: Primus Bravo., MD;  [facility];  Service: Urology;  Laterality: Right;   EXTRACORPOREAL SHOCK WAVE LITHOTRIPSY Right 08/04/2022   Procedure: EXTRACORPOREAL SHOCK WAVE LITHOTRIPSY (ESWL);  Surgeon: Cleon Gustin, MD;  Location: [facility];  Service: Urology;  Laterality: Right;   EXTRACORPOREAL SHOCK WAVE LITHOTRIPSY Right 12/07/2022   Procedure: EXTRACORPOREAL SHOCK WAVE LITHOTRIPSY (ESWL);  Surgeon: Primus Bravo., MD;  Location: [facility];  Service: Urology;  Laterality: Right;   HOLMIUM LASER APPLICATION Right 01/15/5175   Procedure: HOLMIUM LASER APPLICATION/aborted;  Surgeon: Primus Bravo., MD;  Location: [facility];  Service: Urology;  Laterality: Right;   KNEE ARTHROSCOPY Left 1991   also in 2019   LAPAROSCOPIC ASSISTED VAGINAL HYSTERECTOMY  11/02/2006   LAPAROSCOPIC LYSIS OF ADHESIONS  03/06/2005   RADIAL HEAD ARTHROPLASTY Right 01/17/2013   Procedure: RIGHT RADIAL HEAD ARTHROPLASTY OPEN REDUCTION INTERNAL FIXATION CORONOID ;  Surgeon: Tennis Must, MD;  Location: [place];  Service: Orthopedics;  Laterality: Right;   RIGHT/LEFT HEART CATH AND CORONARY ANGIOGRAPHY N/A 08/18/2018   Procedure: RIGHT/LEFT HEART CATH AND CORONARY ANGIOGRAPHY;  Surgeon: Nelva Bush, MD;  Location: [place] CV LAB;  Service: Cardiovascular;  Laterality: N/A;   TUBAL LIGATION  03/06/2005    Allergies:  Allergies  Allergen Reactions   Adhesive [Tape] Rash   Latex Rash    Family History:  Family History  Problem Relation Age of  Onset   Diabetes Sister    Heart disease Father 85       MI--1st Dx CAD. Died2that time   Sudden death Father    Diabetes Mother    Hypertension Mother    Heart disease Mother    Obesity Mother    Heart disease Maternal Uncle    Ovarian cancer Maternal Grandmother    Diabetes Maternal Grandmother    Heart disease Maternal Grandmother        Great Grandmother   Diabetes Paternal Grandmother    Liver disease Paternal Aunt    Heart disease Maternal Grandfather     Social History:  Social History   Tobacco Use   Smoking status: Never    Passive exposure: Past   Smokeless tobacco: Never  Vaping Use   Vaping Use: Never used  Substance Use Topics   Alcohol use: No   Drug use: No  ROS: Constitutional:  Negative for fever, chills, weight loss CV: Negative for chest pain, previous MI, hypertension Respiratory:  Negative for shortness of breath, wheezing, sleep apnea, frequent cough GI:  Negative for nausea, vomiting, bloody stool, GERD  Physical exam: BP 132/82   Pulse 84   Ht '5\' 2"'$  (1.575 m)   Wt 211 lb (95.7 kg)   BMI 38.59 kg/m  GENERAL APPEARANCE:  Well appearing, well developed, well nourished, NAD HEENT:  Atraumatic, normocephalic, oropharynx clear NECK:  Supple without lymphadenopathy or thyromegaly ABDOMEN:  Soft, non-tender, no masses EXTREMITIES:  Moves all extremities well, without clubbing, cyanosis, or edema NEUROLOGIC:  Alert and oriented x 3, normal gait, CN II-XII grossly intact MENTAL STATUS:  appropriate BACK:  Non-tender to palpation, No CVAT SKIN:  Warm, dry, and intact  Results: U/A dipstick: 3+ blood, 2+ protein, trace LE  CYSTOSCOPY/STENT REMOVAL  Pre-Operative Diagnosis:   nephrolithiasis  Post-Operative Diagnosis:  nephrolithiasis  Anesthesia: local with lidocaine gel  Surgical Narrative:  After appropriate informed consent was obtained, the patient was prepped and draped in the usual sterile fashion in the supine position. She was  correctly identified and the proper procedure delineated prior to proceeding. Sterile lidocaine gel was instilled in the urethra. The flexible cystoscope was introduced without difficulty.  The right ureteral stent was identified and removed using stent graspers. She tolerated the procedure well.  A chaperone was present throughout the procedure.

## 2022-12-15 NOTE — Addendum Note (Signed)
Addended by: Evelina Bucy on: 12/15/2022 02:00 PM   Modules accepted: Orders

## 2022-12-16 ENCOUNTER — Other Ambulatory Visit (HOSPITAL_COMMUNITY): Payer: Self-pay

## 2022-12-18 NOTE — Patient Instructions (Signed)
[name]  12/18/2022     '@[email or web address]'$ @   Your procedure is scheduled on  12/24/2022.   Report to [facility] at  0800  A.M.   Call this number if you have problems the morning of surgery:  [phone]  If you experience any cold or flu symptoms such as cough, fever, chills, shortness of breath, etc. between now and your scheduled surgery, please notify us at the above number.   Remember:  Do not eat or drink after midnight.       Use your inhaler before you come and bring your recue inhaler with you.     Take these medicines the morning of surgery with A SIP OF WATER       xanax (if needed), flexeril(if needed), metoprolol, zofran (if needed), oxycodone(if needed), entresto, flomax.     Do not wear jewelry, make-up or nail polish.  Do not wear lotions, powders, or perfumes, or deodorant.  Do not shave 48 hours prior to surgery.  Men may shave face and neck.  Do not bring valuables to the hospital.  [facility] is not responsible for any belongings or valuables.  Contacts, dentures or bridgework may not be worn into surgery.  Leave your suitcase in the car.  After surgery it may be brought to your room.  For patients admitted to the hospital, discharge time will be determined by your treatment team.  Patients discharged the day of surgery will not be allowed to drive home and must have someone with them for 24 hours.    Special instructions:   DO NOT smoke tobacco or vape for 24 hours before your procedure.  Please read over the following fact sheets that you were given. Coughing and Deep Breathing, Surgical Site Infection Prevention, Anesthesia Post-op Instructions, and Care and Recovery After Surgery      Minimally Invasive Cholecystectomy, Care After The following information offers guidance on how to care for yourself after your procedure. Your health care provider may also give you more specific instructions. If you have problems or  questions, contact your health care provider. What can I expect after the procedure? After the procedure, it is common to have: Pain at your incision sites. You will be given medicines to control this pain. Mild nausea or vomiting. Bloating and possible shoulder pain from the gas that was used during the procedure. Follow these instructions at home: Medicines Take over-the-counter and prescription medicines only as told by your health care provider. If you were prescribed an antibiotic medicine, take it as told by your health care provider. Do not stop using the antibiotic even if you start to feel better. Ask your health care provider if the medicine prescribed to you: Requires you to avoid driving or using machinery. Can cause constipation. You may need to take these actions to prevent or treat constipation: Drink enough fluid to keep your urine pale yellow. Take over-the-counter or prescription medicines. Eat foods that are high in fiber, such as beans, whole grains, and fresh fruits and vegetables. Limit foods that are high in fat and processed sugars, such as fried or sweet foods. Incision care  Follow instructions from your health care provider about how to take care of your incisions. Make sure you: Wash your hands with soap and water for at least 20 seconds before and after you change your bandage (dressing). If soap and water are not available, use hand sanitizer. Change your dressing as told  by your health care provider. Leave stitches (sutures), skin glue, or adhesive strips in place. These skin closures may need to be in place for 2 weeks or longer. If adhesive strip edges start to loosen and curl up, you may trim the loose edges. Do not remove adhesive strips completely unless your health care provider tells you to do that. Do not take baths, swim, or use a hot tub until your health care provider approves. Ask your health care provider if you may take showers. You may only be  allowed to take sponge baths. Check your incision area every day for signs of infection. Check for: More redness, swelling, or pain. Fluid or blood. Warmth. Pus or a bad smell. Activity Rest as told by your health care provider. Do not do activities that require a lot of effort. Avoid sitting for a long time without moving. Get up to take short walks every 1-2 hours. This is important to improve blood flow and breathing. Ask for help if you feel weak or unsteady. Do not lift anything that is heavier than 10 lb (4.5 kg), or the limit that you are told, until your health care provider says that it is safe. Do not play contact sports until your health care provider approves. Do not return to work or school until your health care provider approves. Return to your normal activities as told by your health care provider. Ask your health care provider what activities are safe for you. General instructions If you were given a sedative during the procedure, it can affect you for several hours. Do not drive or operate machinery until your health care provider says that it is safe. Keep all follow-up visits. This is important. Contact a health care provider if: You develop a rash. You have more redness, swelling, or pain around your incisions. You have fluid or blood coming from your incisions. Your incisions feel warm to the touch. You have pus or a bad smell coming from your incisions. You have a fever. One or more of your incisions breaks open. Get help right away if: You have trouble breathing. You have chest pain. You have more pain in your shoulders. You faint or feel dizzy when you stand. You have severe pain in your abdomen. You have nausea or vomiting that lasts for more than one day. You have leg pain that is new or unusual, or if it is localized to one specific spot. These symptoms may represent a serious problem that is an emergency. Do not wait to see if the symptoms will go away.  Get medical help right away. Call your local emergency services (911 in the [place] Do not drive yourself to the hospital. Summary After your procedure, it is common to have pain at the incision sites. You may also have nausea or bloating. Follow your health care provider's instructions about medicine, activity restrictions, and caring for your incision areas. Do not do activities that require a lot of effort. Contact a health care provider if you have a fever or other signs of infection, such as more redness, swelling, or pain around the incisions. Get help right away if you have chest pain, increasing pain in the shoulders, or trouble breathing. This information is not intended to replace advice given to you by your health care provider. Make sure you discuss any questions you have with your health care provider. Document Revised: 05/06/2021 Document Reviewed: 05/06/2021 Elsevier Patient Education  [place] Anesthesia, Adult, Care After The following  information offers guidance on how to care for yourself after your procedure. Your health care provider may also give you more specific instructions. If you have problems or questions, contact your health care provider. What can I expect after the procedure? After the procedure, it is common for people to: Have pain or discomfort at the IV site. Have nausea or vomiting. Have a sore throat or hoarseness. Have trouble concentrating. Feel cold or chills. Feel weak, sleepy, or tired (fatigue). Have soreness and body aches. These can affect parts of the body that were not involved in surgery. Follow these instructions at home: For the time period you were told by your health care provider:  Rest. Do not participate in activities where you could fall or become injured. Do not drive or use machinery. Do not drink alcohol. Do not take sleeping pills or medicines that cause drowsiness. Do not make important decisions or sign legal  documents. Do not take care of children on your own. General instructions Drink enough fluid to keep your urine pale yellow. If you have sleep apnea, surgery and certain medicines can increase your risk for breathing problems. Follow instructions from your health care provider about wearing your sleep device: Anytime you are sleeping, including during daytime naps. While taking prescription pain medicines, sleeping medicines, or medicines that make you drowsy. Return to your normal activities as told by your health care provider. Ask your health care provider what activities are safe for you. Take over-the-counter and prescription medicines only as told by your health care provider. Do not use any products that contain nicotine or tobacco. These products include cigarettes, chewing tobacco, and vaping devices, such as e-cigarettes. These can delay incision healing after surgery. If you need help quitting, ask your health care provider. Contact a health care provider if: You have nausea or vomiting that does not get better with medicine. You vomit every time you eat or drink. You have pain that does not get better with medicine. You cannot urinate or have bloody urine. You develop a skin rash. You have a fever. Get help right away if: You have trouble breathing. You have chest pain. You vomit blood. These symptoms may be an emergency. Get help right away. Call 911. Do not wait to see if the symptoms will go away. Do not drive yourself to the hospital. Summary After the procedure, it is common to have a sore throat, hoarseness, nausea, vomiting, or to feel weak, sleepy, or fatigue. For the time period you were told by your health care provider, do not drive or use machinery. Get help right away if you have difficulty breathing, have chest pain, or vomit blood. These symptoms may be an emergency. This information is not intended to replace advice given to you by your health care provider.  Make sure you discuss any questions you have with your health care provider. Document Revised: 01/30/2022 Document Reviewed: 01/30/2022 Elsevier Patient Education  [place]. How to Use Chlorhexidine Before Surgery Chlorhexidine gluconate (CHG) is a germ-killing (antiseptic) solution that is used to clean the skin. It can get rid of the bacteria that normally live on the skin and can keep them away for about 24 hours. To clean your skin with CHG, you may be given: A CHG solution to use in the shower or as part of a sponge bath. A prepackaged cloth that contains CHG. Cleaning your skin with CHG may help lower the risk for infection: While you are staying in the intensive care  unit of the hospital. If you have a vascular access, such as a central line, to provide short-term or long-term access to your veins. If you have a catheter to drain urine from your bladder. If you are on a ventilator. A ventilator is a machine that helps you breathe by moving air in and out of your lungs. After surgery. What are the risks? Risks of using CHG include: A skin reaction. Hearing loss, if CHG gets in your ears and you have a perforated eardrum. Eye injury, if CHG gets in your eyes and is not rinsed out. The CHG product catching fire. Make sure that you avoid smoking and flames after applying CHG to your skin. Do not use CHG: If you have a chlorhexidine allergy or have previously reacted to chlorhexidine. On babies younger than 20 months of age. How to use CHG solution Use CHG only as told by your health care provider, and follow the instructions on the label. Use the full amount of CHG as directed. Usually, this is one bottle. During a shower Follow these steps when using CHG solution during a shower (unless your health care provider gives you different instructions): Start the shower. Use your normal soap and shampoo to wash your face and hair. Turn off the shower or move out of the shower  stream. Pour the CHG onto a clean washcloth. Do not use any type of brush or rough-edged sponge. Starting at your neck, lather your body down to your toes. Make sure you follow these instructions: If you will be having surgery, pay special attention to the part of your body where you will be having surgery. Scrub this area for at least 1 minute. Do not use CHG on your head or face. If the solution gets into your ears or eyes, rinse them well with water. Avoid your genital area. Avoid any areas of skin that have broken skin, cuts, or scrapes. Scrub your back and under your arms. Make sure to wash skin folds. Let the lather sit on your skin for 1-2 minutes or as long as told by your health care provider. Thoroughly rinse your entire body in the shower. Make sure that all body creases and crevices are rinsed well. Dry off with a clean towel. Do not put any substances on your body afterward--such as powder, lotion, or perfume--unless you are told to do so by your health care provider. Only use lotions that are recommended by the manufacturer. Put on clean clothes or pajamas. If it is the night before your surgery, sleep in clean sheets.  During a sponge bath Follow these steps when using CHG solution during a sponge bath (unless your health care provider gives you different instructions): Use your normal soap and shampoo to wash your face and hair. Pour the CHG onto a clean washcloth. Starting at your neck, lather your body down to your toes. Make sure you follow these instructions: If you will be having surgery, pay special attention to the part of your body where you will be having surgery. Scrub this area for at least 1 minute. Do not use CHG on your head or face. If the solution gets into your ears or eyes, rinse them well with water. Avoid your genital area. Avoid any areas of skin that have broken skin, cuts, or scrapes. Scrub your back and under your arms. Make sure to wash skin folds. Let  the lather sit on your skin for 1-2 minutes or as long as told by your health  care provider. Using a different clean, wet washcloth, thoroughly rinse your entire body. Make sure that all body creases and crevices are rinsed well. Dry off with a clean towel. Do not put any substances on your body afterward--such as powder, lotion, or perfume--unless you are told to do so by your health care provider. Only use lotions that are recommended by the manufacturer. Put on clean clothes or pajamas. If it is the night before your surgery, sleep in clean sheets. How to use CHG prepackaged cloths Only use CHG cloths as told by your health care provider, and follow the instructions on the label. Use the CHG cloth on clean, dry skin. Do not use the CHG cloth on your head or face unless your health care provider tells you to. When washing with the CHG cloth: Avoid your genital area. Avoid any areas of skin that have broken skin, cuts, or scrapes. Before surgery Follow these steps when using a CHG cloth to clean before surgery (unless your health care provider gives you different instructions): Using the CHG cloth, vigorously scrub the part of your body where you will be having surgery. Scrub using a back-and-forth motion for 3 minutes. The area on your body should be completely wet with CHG when you are done scrubbing. Do not rinse. Discard the cloth and let the area air-dry. Do not put any substances on the area afterward, such as powder, lotion, or perfume. Put on clean clothes or pajamas. If it is the night before your surgery, sleep in clean sheets.  For general bathing Follow these steps when using CHG cloths for general bathing (unless your health care provider gives you different instructions). Use a separate CHG cloth for each area of your body. Make sure you wash between any folds of skin and between your fingers and toes. Wash your body in the following order, switching to a new cloth after each  step: The front of your neck, shoulders, and chest. Both of your arms, under your arms, and your hands. Your stomach and groin area, avoiding the genitals. Your right leg and foot. Your left leg and foot. The back of your neck, your back, and your buttocks. Do not rinse. Discard the cloth and let the area air-dry. Do not put any substances on your body afterward--such as powder, lotion, or perfume--unless you are told to do so by your health care provider. Only use lotions that are recommended by the manufacturer. Put on clean clothes or pajamas. Contact a health care provider if: Your skin gets irritated after scrubbing. You have questions about using your solution or cloth. You swallow any chlorhexidine. Call your local poison control center (1-458-408-9439 in the [place] Get help right away if: Your eyes itch badly, or they become very red or swollen. Your skin itches badly and is red or swollen. Your hearing changes. You have trouble seeing. You have swelling or tingling in your mouth or throat. You have trouble breathing. These symptoms may represent a serious problem that is an emergency. Do not wait to see if the symptoms will go away. Get medical help right away. Call your local emergency services (911 in the [place] Do not drive yourself to the hospital. Summary Chlorhexidine gluconate (CHG) is a germ-killing (antiseptic) solution that is used to clean the skin. Cleaning your skin with CHG may help to lower your risk for infection. You may be given CHG to use for bathing. It may be in a bottle or in a prepackaged cloth to use  on your skin. Carefully follow your health care provider's instructions and the instructions on the product label. Do not use CHG if you have a chlorhexidine allergy. Contact your health care provider if your skin gets irritated after scrubbing. This information is not intended to replace advice given to you by your health care provider. Make sure you discuss  any questions you have with your health care provider. Document Revised: 03/02/2022 Document Reviewed: 01/13/2021 Elsevier Patient Education  [place].

## 2022-12-22 ENCOUNTER — Encounter (HOSPITAL_COMMUNITY)
Admission: RE | Admit: 2022-12-22 | Discharge: 2022-12-22 | Disposition: A | Discharge status: Home / Self Care | Payer: Commercial Managed Care - PPO | Source: Ambulatory Visit | Attending: Surgery | Admitting: Surgery

## 2022-12-22 VITALS — BP 127/65 | HR 66 | Temp 97.6°F | Resp 18 | Ht 62.0 in | Wt 211.0 lb

## 2022-12-22 DIAGNOSIS — Z01812 Encounter for preprocedural laboratory examination: Secondary | ICD-10-CM | POA: Diagnosis not present

## 2022-12-22 DIAGNOSIS — R7303 Prediabetes: Secondary | ICD-10-CM | POA: Insufficient documentation

## 2022-12-22 LAB — HEMOGLOBIN A1C
Hgb A1c MFr Bld: 5.5 % (ref 4.8–5.6)
Mean Plasma Glucose: 111.15 mg/dL

## 2022-12-24 ENCOUNTER — Ambulatory Visit (HOSPITAL_COMMUNITY): Payer: Commercial Managed Care - PPO | Admitting: Anesthesiology

## 2022-12-24 ENCOUNTER — Encounter (HOSPITAL_COMMUNITY): Payer: Self-pay | Admitting: Surgery

## 2022-12-24 ENCOUNTER — Ambulatory Visit (HOSPITAL_COMMUNITY)
Admission: RE | Admit: 2022-12-24 | Discharge: 2022-12-24 | Disposition: A | Discharge status: Home / Self Care | Payer: Commercial Managed Care - PPO | Attending: Surgery | Admitting: Surgery

## 2022-12-24 ENCOUNTER — Other Ambulatory Visit: Payer: Self-pay

## 2022-12-24 ENCOUNTER — Ambulatory Visit (HOSPITAL_BASED_OUTPATIENT_CLINIC_OR_DEPARTMENT_OTHER): Payer: Commercial Managed Care - PPO | Admitting: Anesthesiology

## 2022-12-24 ENCOUNTER — Encounter (HOSPITAL_COMMUNITY)
Admission: RE | Disposition: A | Discharge status: Home / Self Care | Payer: Self-pay | Source: Home / Self Care | Attending: Surgery

## 2022-12-24 DIAGNOSIS — E039 Hypothyroidism, unspecified: Secondary | ICD-10-CM | POA: Insufficient documentation

## 2022-12-24 DIAGNOSIS — R7303 Prediabetes: Secondary | ICD-10-CM | POA: Insufficient documentation

## 2022-12-24 DIAGNOSIS — K8012 Calculus of gallbladder with acute and chronic cholecystitis without obstruction: Secondary | ICD-10-CM | POA: Diagnosis not present

## 2022-12-24 DIAGNOSIS — K82A1 Gangrene of gallbladder in cholecystitis: Secondary | ICD-10-CM | POA: Diagnosis not present

## 2022-12-24 DIAGNOSIS — Z6838 Body mass index (BMI) 38.0-38.9, adult: Secondary | ICD-10-CM | POA: Diagnosis not present

## 2022-12-24 DIAGNOSIS — J45909 Unspecified asthma, uncomplicated: Secondary | ICD-10-CM | POA: Insufficient documentation

## 2022-12-24 DIAGNOSIS — Z7984 Long term (current) use of oral hypoglycemic drugs: Secondary | ICD-10-CM | POA: Diagnosis not present

## 2022-12-24 DIAGNOSIS — I2510 Atherosclerotic heart disease of native coronary artery without angina pectoris: Secondary | ICD-10-CM | POA: Diagnosis not present

## 2022-12-24 DIAGNOSIS — E785 Hyperlipidemia, unspecified: Secondary | ICD-10-CM | POA: Diagnosis not present

## 2022-12-24 DIAGNOSIS — I509 Heart failure, unspecified: Secondary | ICD-10-CM | POA: Insufficient documentation

## 2022-12-24 DIAGNOSIS — Z7982 Long term (current) use of aspirin: Secondary | ICD-10-CM | POA: Diagnosis not present

## 2022-12-24 DIAGNOSIS — Z87442 Personal history of urinary calculi: Secondary | ICD-10-CM | POA: Diagnosis not present

## 2022-12-24 DIAGNOSIS — E669 Obesity, unspecified: Secondary | ICD-10-CM | POA: Insufficient documentation

## 2022-12-24 DIAGNOSIS — K8000 Calculus of gallbladder with acute cholecystitis without obstruction: Secondary | ICD-10-CM

## 2022-12-24 DIAGNOSIS — F909 Attention-deficit hyperactivity disorder, unspecified type: Secondary | ICD-10-CM | POA: Diagnosis not present

## 2022-12-24 DIAGNOSIS — K810 Acute cholecystitis: Secondary | ICD-10-CM | POA: Diagnosis not present

## 2022-12-24 DIAGNOSIS — Z79899 Other long term (current) drug therapy: Secondary | ICD-10-CM | POA: Diagnosis not present

## 2022-12-24 DIAGNOSIS — F32A Depression, unspecified: Secondary | ICD-10-CM | POA: Insufficient documentation

## 2022-12-24 DIAGNOSIS — K219 Gastro-esophageal reflux disease without esophagitis: Secondary | ICD-10-CM | POA: Diagnosis not present

## 2022-12-24 DIAGNOSIS — F419 Anxiety disorder, unspecified: Secondary | ICD-10-CM | POA: Diagnosis not present

## 2022-12-24 DIAGNOSIS — I110 Hypertensive heart disease with heart failure: Secondary | ICD-10-CM | POA: Insufficient documentation

## 2022-12-24 DIAGNOSIS — K8010 Calculus of gallbladder with chronic cholecystitis without obstruction: Secondary | ICD-10-CM | POA: Diagnosis not present

## 2022-12-24 DIAGNOSIS — G4733 Obstructive sleep apnea (adult) (pediatric): Secondary | ICD-10-CM | POA: Diagnosis not present

## 2022-12-24 SURGERY — CHOLECYSTECTOMY, ROBOT-ASSISTED, LAPAROSCOPIC
Anesthesia: General | Site: Abdomen

## 2022-12-24 MED ORDER — SUGAMMADEX SODIUM 500 MG/5ML IV SOLN
INTRAVENOUS | Status: DC | PRN
  Administered 2022-12-24: 200 mg via INTRAVENOUS

## 2022-12-24 MED ORDER — INDOCYANINE GREEN 25 MG IV SOLR
INTRAVENOUS | Status: AC
  Filled 2022-12-24: qty 10

## 2022-12-24 MED ORDER — ASPIRIN EC 81 MG PO TBEC: 81.0000 mg | DELAYED_RELEASE_TABLET | Freq: Every day | ORAL | 11 refills | Status: DC

## 2022-12-24 MED ORDER — SCOPOLAMINE 1 MG/3DAYS TD PT72
1.0000 | MEDICATED_PATCH | TRANSDERMAL | Status: DC
  Administered 2022-12-24: 1.5 mg via TRANSDERMAL

## 2022-12-24 MED ORDER — SEVOFLURANE IN SOLN
RESPIRATORY_TRACT | Status: AC
  Filled 2022-12-24: qty 250

## 2022-12-24 MED ORDER — DEXAMETHASONE SODIUM PHOSPHATE 10 MG/ML IJ SOLN
INTRAMUSCULAR | Status: AC
  Filled 2022-12-24: qty 1

## 2022-12-24 MED ORDER — ONDANSETRON HCL 4 MG/2ML IJ SOLN
4.0000 mg | Freq: Once | INTRAMUSCULAR | Status: AC | PRN
  Administered 2022-12-24: 4 mg via INTRAVENOUS
  Filled 2022-12-24: qty 2

## 2022-12-24 MED ORDER — BUPIVACAINE LIPOSOME 1.3 % IJ SUSP
INTRAMUSCULAR | Status: DC | PRN
  Administered 2022-12-24: 20 mL

## 2022-12-24 MED ORDER — INDOCYANINE GREEN 25 MG IV SOLR
2.5000 mg | Freq: Once | INTRAVENOUS | Status: AC
  Administered 2022-12-24: 2.5 mg via INTRAVENOUS

## 2022-12-24 MED ORDER — STERILE WATER FOR IRRIGATION IR SOLN
Status: DC | PRN
  Administered 2022-12-24: 500 mL

## 2022-12-24 MED ORDER — SODIUM CHLORIDE 0.9 % IR SOLN
Status: DC | PRN
  Administered 2022-12-24: 3000 mL

## 2022-12-24 MED ORDER — DOCUSATE SODIUM 100 MG PO CAPS: 100.0000 mg | ORAL_CAPSULE | Freq: Two times a day (BID) | ORAL | 2 refills | Status: AC

## 2022-12-24 MED ORDER — ACETAMINOPHEN 500 MG PO TABS: 1000.0000 mg | ORAL_TABLET | Freq: Four times a day (QID) | ORAL | 0 refills | Status: AC

## 2022-12-24 MED ORDER — LIDOCAINE HCL (CARDIAC) PF 50 MG/5ML IV SOSY
PREFILLED_SYRINGE | INTRAVENOUS | Status: DC | PRN
  Administered 2022-12-24: 60 mg via INTRAVENOUS
  Administered 2022-12-24: 40 mg via INTRAVENOUS

## 2022-12-24 MED ORDER — PROPOFOL 10 MG/ML IV BOLUS
INTRAVENOUS | Status: DC | PRN
  Administered 2022-12-24: 200 mg via INTRAVENOUS

## 2022-12-24 MED ORDER — DEXAMETHASONE SODIUM PHOSPHATE 10 MG/ML IJ SOLN
INTRAMUSCULAR | Status: DC | PRN
  Administered 2022-12-24: 5 mg via INTRAVENOUS

## 2022-12-24 MED ORDER — OXYCODONE HCL 5 MG PO TABS: 5.0000 mg | ORAL_TABLET | Freq: Four times a day (QID) | ORAL | 0 refills | Status: DC | PRN

## 2022-12-24 MED ORDER — ONDANSETRON HCL 4 MG/2ML IJ SOLN
INTRAMUSCULAR | Status: AC
  Filled 2022-12-24: qty 2

## 2022-12-24 MED ORDER — BUPIVACAINE LIPOSOME 1.3 % IJ SUSP
INTRAMUSCULAR | Status: AC
  Filled 2022-12-24: qty 20

## 2022-12-24 MED ORDER — FENTANYL CITRATE PF 50 MCG/ML IJ SOSY
25.0000 ug | PREFILLED_SYRINGE | INTRAMUSCULAR | Status: DC | PRN
  Administered 2022-12-24 (×4): 50 ug via INTRAVENOUS
  Filled 2022-12-24 (×2): qty 1

## 2022-12-24 MED ORDER — ONDANSETRON HCL 4 MG/2ML IJ SOLN
INTRAMUSCULAR | Status: DC | PRN
  Administered 2022-12-24: 4 mg via INTRAVENOUS

## 2022-12-24 MED ORDER — SODIUM CHLORIDE 0.9 % IV SOLN
2.0000 g | INTRAVENOUS | Status: AC
  Administered 2022-12-24: 2 g via INTRAVENOUS
  Filled 2022-12-24: qty 2

## 2022-12-24 MED ORDER — ROCURONIUM BROMIDE 10 MG/ML (PF) SYRINGE
PREFILLED_SYRINGE | INTRAVENOUS | Status: AC
  Filled 2022-12-24: qty 10

## 2022-12-24 MED ORDER — PROPOFOL 10 MG/ML IV BOLUS
INTRAVENOUS | Status: AC
  Filled 2022-12-24: qty 20

## 2022-12-24 MED ORDER — FENTANYL CITRATE PF 50 MCG/ML IJ SOSY
PREFILLED_SYRINGE | INTRAMUSCULAR | Status: AC
  Filled 2022-12-24: qty 1

## 2022-12-24 MED ORDER — LIDOCAINE HCL (PF) 2 % IJ SOLN
INTRAMUSCULAR | Status: AC
  Filled 2022-12-24: qty 5

## 2022-12-24 MED ORDER — SCOPOLAMINE 1 MG/3DAYS TD PT72
MEDICATED_PATCH | TRANSDERMAL | Status: AC
  Filled 2022-12-24: qty 1

## 2022-12-24 MED ORDER — LACTATED RINGERS IV SOLN: INTRAVENOUS | Status: DC | PRN

## 2022-12-24 MED ORDER — CHLORHEXIDINE GLUCONATE CLOTH 2 % EX PADS: 6.0000 | MEDICATED_PAD | Freq: Once | CUTANEOUS | Status: DC

## 2022-12-24 MED ORDER — OXYCODONE HCL 5 MG PO TABS
5.0000 mg | ORAL_TABLET | Freq: Once | ORAL | Status: AC | PRN
  Administered 2022-12-24: 5 mg via ORAL
  Filled 2022-12-24: qty 1

## 2022-12-24 MED ORDER — FENTANYL CITRATE PF 50 MCG/ML IJ SOSY: 50.0000 ug | PREFILLED_SYRINGE | Freq: Once | INTRAMUSCULAR | Status: DC

## 2022-12-24 MED ORDER — FENTANYL CITRATE (PF) 100 MCG/2ML IJ SOLN
INTRAMUSCULAR | Status: DC | PRN
  Administered 2022-12-24 (×4): 50 ug via INTRAVENOUS
  Administered 2022-12-24: 100 ug via INTRAVENOUS
  Administered 2022-12-24: 50 ug via INTRAVENOUS

## 2022-12-24 MED ORDER — FENTANYL CITRATE (PF) 250 MCG/5ML IJ SOLN
INTRAMUSCULAR | Status: AC
  Filled 2022-12-24: qty 5

## 2022-12-24 MED ORDER — OXYCODONE HCL 5 MG/5ML PO SOLN: 5.0000 mg | Freq: Once | ORAL | Status: AC | PRN

## 2022-12-24 MED ORDER — FENTANYL CITRATE (PF) 100 MCG/2ML IJ SOLN
INTRAMUSCULAR | Status: AC
  Filled 2022-12-24: qty 2

## 2022-12-24 MED ORDER — ROCURONIUM BROMIDE 100 MG/10ML IV SOLN
INTRAVENOUS | Status: DC | PRN
  Administered 2022-12-24: 100 mg via INTRAVENOUS

## 2022-12-24 MED ORDER — AMOXICILLIN-POT CLAVULANATE 500-125 MG PO TABS: 1.0000 | ORAL_TABLET | Freq: Three times a day (TID) | ORAL | 0 refills | Status: AC

## 2022-12-24 SURGICAL SUPPLY — 45 items
ADH SKN CLS APL DERMABOND .7 (GAUZE/BANDAGES/DRESSINGS) ×1
APL PRP STRL LF DISP 70% ISPRP (MISCELLANEOUS) ×1
BLADE SURG 15 STRL LF DISP TIS (BLADE) ×2 IMPLANT
BLADE SURG 15 STRL SS (BLADE) ×1
CHLORAPREP W/TINT 26 (MISCELLANEOUS) ×2 IMPLANT
CLIP LIGATING HEM O LOK PURPLE (MISCELLANEOUS) ×2 IMPLANT
COVER TIP SHEARS 8 DVNC (MISCELLANEOUS) ×2 IMPLANT
COVER TIP SHEARS 8MM DA VINCI (MISCELLANEOUS) ×1
DEFOGGER SCOPE WARMER CLEARIFY (MISCELLANEOUS) IMPLANT
DERMABOND ADVANCED .7 DNX12 (GAUZE/BANDAGES/DRESSINGS) ×2 IMPLANT
DRAPE ARM DVNC X/XI (DISPOSABLE) ×8 IMPLANT
DRAPE COLUMN DVNC XI (DISPOSABLE) ×2 IMPLANT
DRAPE DA VINCI XI ARM (DISPOSABLE) ×4
DRAPE DA VINCI XI COLUMN (DISPOSABLE) ×1
ELECT REM PT RETURN 9FT ADLT (ELECTROSURGICAL) ×1
ELECTRODE REM PT RTRN 9FT ADLT (ELECTROSURGICAL) ×2 IMPLANT
GLOVE BIOGEL PI IND STRL 6.5 (GLOVE) ×4 IMPLANT
GLOVE BIOGEL PI IND STRL 7.0 (GLOVE) ×6 IMPLANT
GLOVE SURG SS PI 6.5 STRL IVOR (GLOVE) ×4 IMPLANT
GOWN STRL REUS W/TWL LRG LVL3 (GOWN DISPOSABLE) ×6 IMPLANT
GRASPER SUT TROCAR 14GX15 (MISCELLANEOUS) IMPLANT
IRRIGATOR SUCT 8 DISP DVNC XI (IRRIGATION / IRRIGATOR) IMPLANT
IRRIGATOR SUCTION 8MM XI DISP (IRRIGATION / IRRIGATOR) ×1
IV NS IRRIG 3000ML ARTHROMATIC (IV SOLUTION) IMPLANT
MANIFOLD NEPTUNE II (INSTRUMENTS) ×2 IMPLANT
NDL HYPO 21X1.5 SAFETY (NEEDLE) ×2 IMPLANT
NDL INSUFFLATION 14GA 120MM (NEEDLE) ×2 IMPLANT
NEEDLE HYPO 21X1.5 SAFETY (NEEDLE) ×1 IMPLANT
NEEDLE INSUFFLATION 14GA 120MM (NEEDLE) ×1 IMPLANT
OBTURATOR OPTICAL STANDARD 8MM (TROCAR) ×1
OBTURATOR OPTICAL STND 8 DVNC (TROCAR) ×1
OBTURATOR OPTICALSTD 8 DVNC (TROCAR) ×2 IMPLANT
PACK LAP CHOLE LZT030E (CUSTOM PROCEDURE TRAY) ×2 IMPLANT
PAD ARMBOARD 7.5X6 YLW CONV (MISCELLANEOUS) ×2 IMPLANT
PENCIL HANDSWITCHING (ELECTRODE) IMPLANT
SEAL CANN UNIV 5-8 DVNC XI (MISCELLANEOUS) ×6 IMPLANT
SEAL XI 5MM-8MM UNIVERSAL (MISCELLANEOUS) ×4
SET BASIN LINEN APH (SET/KITS/TRAYS/PACK) ×2 IMPLANT
SET TUBE SMOKE EVAC HIGH FLOW (TUBING) ×2 IMPLANT
SUT MNCRL AB 4-0 PS2 18 (SUTURE) ×4 IMPLANT
SUT VICRYL 0 UR6 27IN ABS (SUTURE) IMPLANT
SYR 20ML LL LF (SYRINGE) ×2 IMPLANT
SYS RETRIEVAL 5MM INZII UNIV (BASKET) ×1
SYSTEM RETRIEVL 5MM INZII UNIV (BASKET) IMPLANT
WATER STERILE IRR 500ML POUR (IV SOLUTION) ×2 IMPLANT

## 2022-12-24 NOTE — Anesthesia Procedure Notes (Signed)
Procedure Name: Intubation Date/Time: 12/24/2022 10:45 AM  Performed by: Purcell Nails, RNPre-anesthesia Checklist: Patient identified, Emergency Drugs available, Suction available and Patient being monitored Patient Re-evaluated:Patient Re-evaluated prior to induction Oxygen Delivery Method: Circle system utilized Preoxygenation: Pre-oxygenation with 100% oxygen Induction Type: IV induction Ventilation: Mask ventilation without difficulty Laryngoscope Size: 3 and Mac Grade View: Grade I Tube type: Oral Tube size: 7.0 mm Number of attempts: 1 Airway Equipment and Method: Stylet and Oral airway Placement Confirmation: ETT inserted through vocal cords under direct vision, positive ETCO2 and breath sounds checked- equal and bilateral Secured at: 21 cm Tube secured with: Tape Dental Injury: Teeth and Oropharynx as per pre-operative assessment

## 2022-12-24 NOTE — Discharge Instructions (Signed)
Ambulatory Surgery Discharge Instructions  General Anesthesia or Sedation Do not drive or operate heavy machinery for 24 hours.  Do not consume alcohol, tranquilizers, sleeping medications, or any non-prescribed medications for 24 hours. Do not make important decisions or sign any important papers in the next 24 hours. You should have someone with you tonight at home.  Activity  You are advised to go directly home from the hospital.  Restrict your activities and rest for a day.  Resume light activity tomorrow. No heavy lifting over 10 lbs or strenuous exercise.  Fluids and Diet Begin with clear liquids, bouillon, dry toast, soda crackers.  If not nauseated, you may go to a regular diet when you desire.  Greasy and spicy foods are not advised.  Medications  If you have not had a bowel movement in 24 hours, take 2 tablespoons over the counter Milk of mag.             You May resume your blood thinners tomorrow (Aspirin, coumadin, or other).  You are being discharged with prescriptions for Opioid/Narcotic Medications: There are some specific considerations for these medications that you should know. Opioid Meds have risks & benefits. Addiction to these meds is always a concern with prolonged use Take medication only as directed Do not drive while taking narcotic pain medication Do not crush tablets or capsules Do not use a different container than medication was dispensed in Lock the container of medication in a cool, dry place out of reach of children and pets. Opioid medication can cause addiction Do not share with anyone else (this is a felony) Do not store medications for future use. Dispose of them properly.     Disposal:  Find a [facility] household drug take back site near you.  If you can't get to a drug take back site, use the recipe below as a last resort to dispose of expired, unused or unwanted drugs. Disposal  (Do not dispose chemotherapy drugs this way, talk to your  prescribing doctor instead.) Step 1: Mix drugs (do not crush) with dirt, kitty litter, or used coffee grounds and add a small amount of water to dissolve any solid medications. Step 2: Seal drugs in plastic bag. Step 3: Place plastic bag in trash. Step 4: Take prescription container and scratch out personal information, then recycle or throw away.  Operative Site  You have a liquid bandage over your incisions, this will begin to flake off in about a week. Ok to shower tomorrow. Keep wound clean and dry. No baths or swimming. No lifting more than 10 pounds.  Contact Information: If you have questions or concerns, please call our office, [phone], Monday- Thursday 8AM-5PM and Friday 8AM-12Noon.  If it is after hours or on the weekend, please call [facility], [phone], and ask to speak to the surgeon on call for Dr. Ulas Zuercher at [place].   SPECIFIC COMPLICATIONS TO WATCH FOR: Inability to urinate Fever over 101? F by mouth Nausea and vomiting lasting longer than 24 hours. Pain not relieved by medication ordered Swelling around the operative site Increased redness, warmth, hardness, around operative area Numbness, tingling, or cold fingers or toes Blood -soaked dressing, (small amounts of oozing may be normal) Increasing and progressive drainage from surgical area or exam site  

## 2022-12-24 NOTE — Progress Notes (Addendum)
[facility]  Spoke with the patient's husband in the consultation room.  I explained that she tolerated the procedure without difficulty.  She has dissolvable stitches under the skin with overlying skin glue.  This will flake off in 10 to 14 days.  I discharged her home with a prescription for narcotic pain medication that they should take as needed for pain.  I also want her taking scheduled Tylenol.  If they take the narcotic pain medication, they should take a stool softener as well.  The patient will follow-up with me in 2 weeks.  Given the acute inflammation of her gallbladder, I will also be sending her home with a an antibiotic prescription.  All questions were answered to his expressed satisfaction.  Graciella Freer, DO [facility] [address] [place], [place] [address] [phone] (office)

## 2022-12-24 NOTE — Interval H&P Note (Signed)
History and Physical Interval Note:  12/24/2022 10:05 AM  [name]  has presented today for surgery, with the diagnosis of CHOLELITHIASIS.  The various methods of treatment have been discussed with the patient and family. After consideration of risks, benefits and other options for treatment, the patient has consented to  Procedure(s): XI ROBOTIC [place] (N/A) as a surgical intervention.  The patient's history has been reviewed, patient examined, no change in status, stable for surgery.  I have reviewed the patient's chart and labs.  Questions were answered to the patient's satisfaction.     [place]

## 2022-12-24 NOTE — Op Note (Signed)
[facility] Operative Note  Preoperative diagnosis: Cholelithiasis  Postoperative diagnosis: Cholelithiasis and acute cholecystitis  Procedure: Robotic assisted Laparoscopic Cholecystectomy.   Anesthesia: GETA   Surgeon: Graciella Freer, DO  Specimen: Gallbladder  Complications: None  EBL: 2m  Wound Classification: Clean Contaminated  Indications: Patient is a 52year old female who presents for cholecystectomy.  She has had a several month history of right upper quadrant abdominal pain that was thought to be secondary to kidney stones.  She was noted to have gallstones on abdominal imaging, and decision was made to proceed with cholecystectomy.  Patient is agreeable to surgery  All risks and benefits of performing this procedure were discussed with the patient including pain, infection, bleeding, damage to the surrounding structures, and need for more procedures or surgery. The patient voiced understanding of the procedure, all questions were sought and answered, and consent was obtained.  Findings: Critical view of safety noted Cystic duct and artery identified, ligated and divided, clips remained intact at end of procedure Adequate hemostasis Gallbladder acutely inflamed with purulence noted  Description of procedure:  The patient was placed on the operating table in the supine position. SCDs placed, pre-op abx administered.  General anesthesia was induced and OG tube placed by anesthesia. A time-out was completed verifying correct patient, procedure, site, positioning, and implant(s) and/or special equipment prior to beginning this procedure. The abdomen was prepped and draped in the usual sterile fashion.    Veress needle was placed at umbilicus and insufflation was started after confirming a positive saline drop test and no immediate increase in abdominal pressure.  After reaching 15 mm, the Veress needle was removed and a 8 mm port was placed via  optiview technique under umbilicus measured 225ZDfrom gallbladder.  The abdomen was inspected and no abnormalities or injuries were found.  Under direct vision, ports were placed in the following locations: One 8 mm patient left of the umbilicus, 8cm from the optiviewed port, one 8 mm port placed to the patient right of the umbilical port 8 cm apart.  1 additional 8 mm port placed lateral to the right abdominal port.  Once ports were placed, The table was placed in the reverse Trendelenburg position with the right side up. The Xi platform was brought into the operative field and docked to the ports successfully.  An endoscope was placed through the umbilical port, fenestrated bipolar through the adjacent patient right port, prograsp to the far patient right port, and then a hook cautery in the left port.  The dome of the gallbladder was grasped with prograsp, passed and retracted over the dome of the liver. Adhesions between the gallbladder and omentum, duodenum and transverse colon were lysed via hook cautery. The infundibulum was grasped with the fenestrated grasper and retracted toward the right lower quadrant. This maneuver exposed Calot's triangle. The peritoneum overlying the gallbladder infundibulum was then dissected  and the cystic duct and cystic artery identified.  Critical view of safety with the liver bed clearly visible behind the duct and artery with no additional structures noted.  The cystic duct and cystic artery clipped and divided close to the gallbladder.     The gallbladder was then dissected from its peritoneal and liver bed attachments by electrocautery.  During dissection, purulence was encountered within the gallbladder.  Hemostasis was checked prior to removing the hook cautery and the Endo Catch bag was then placed through the 8 mm left lateral port and the gallbladder was removed.  The gallbladder was passed  off the table as a specimen. There was no evidence of bleeding from the  gallbladder fossa or cystic artery or leakage of the bile from the cystic duct stump. The 8 mm left lateral port site closed with PMI using 0 vicryl under direct vision.  Abdomen desufflated and secondary trocars were removed under direct vision. No bleeding was noted.  Incisions were localized with Exparel.  All skin incisions then closed with subcuticular sutures of 4-0 monocryl and dressed with topical skin adhesive. The orogastric tube was removed and patient extubated.  The patient tolerated the procedure well and was taken to the postanesthesia care unit in stable condition.  All sponge and instrument count correct at end of procedure.  Graciella Freer, DO [facility] [address] [place], [place] [address] [phone] (office)

## 2022-12-24 NOTE — Anesthesia Preprocedure Evaluation (Signed)
Anesthesia Evaluation  Patient identified by MRN, date of birth, ID band Patient awake    Reviewed: Allergy & Precautions, H&P , NPO status , Patient's Chart, lab work & pertinent test results, reviewed documented beta blocker date and time   Airway Mallampati: II  TM Distance: >3 FB Neck ROM: full    Dental no notable dental hx.    Pulmonary neg pulmonary ROS, shortness of breath, asthma , sleep apnea    Pulmonary exam normal breath sounds clear to auscultation       Cardiovascular Exercise Tolerance: Good hypertension, + CAD and +CHF  negative cardio ROS  Rhythm:regular Rate:Normal     Neuro/Psych  PSYCHIATRIC DISORDERS Anxiety Depression    negative neurological ROS  negative psych ROS   GI/Hepatic negative GI ROS, Neg liver ROS,GERD  ,,  Endo/Other  negative endocrine ROSHypothyroidism    Renal/GU Renal diseasenegative Renal ROS  negative genitourinary   Musculoskeletal   Abdominal   Peds  Hematology negative hematology ROS (+)   Anesthesia Other Findings   Reproductive/Obstetrics negative OB ROS                             Anesthesia Physical Anesthesia Plan  ASA: 2  Anesthesia Plan: General and General ETT   Post-op Pain Management:    Induction:   PONV Risk Score and Plan: Ondansetron  Airway Management Planned:   Additional Equipment:   Intra-op Plan:   Post-operative Plan:   Informed Consent: I have reviewed the patients History and Physical, chart, labs and discussed the procedure including the risks, benefits and alternatives for the proposed anesthesia with the patient or authorized representative who has indicated his/her understanding and acceptance.     Dental Advisory Given  Plan Discussed with: CRNA  Anesthesia Plan Comments:        Anesthesia Quick Evaluation

## 2022-12-24 NOTE — Transfer of Care (Signed)
Immediate Anesthesia Transfer of Care Note  Patient: LORALIE MALTA  Procedure(s) Performed: XI ROBOTIC ASSISTED LAPAROSCOPIC CHOLECYSTECTOMY (Abdomen)  Patient Location: PACU  Anesthesia Type:General  Level of Consciousness: awake and patient cooperative  Airway & Oxygen Therapy: Patient Spontanous Breathing and Patient connected to nasal cannula oxygen  Post-op Assessment: Report given to RN, Post -op Vital signs reviewed and stable, and Patient moving all extremities  Post vital signs: Reviewed and stable  Last Vitals:  Vitals Value Taken Time  BP 172/95 12/24/22 1253  Temp 36.7 C 12/24/22 1252  Pulse 89 12/24/22 1257  Resp 18 12/24/22 1257  SpO2 99 % 12/24/22 1257  Vitals shown include unvalidated device data.  Last Pain:  Vitals:   12/24/22 0826  TempSrc: Oral  PainSc: 0-No pain         Complications: No notable events documented.

## 2022-12-25 LAB — GLUCOSE, CAPILLARY: Glucose-Capillary: 86 mg/dL (ref 70–99)

## 2022-12-25 LAB — SURGICAL PATHOLOGY

## 2022-12-25 NOTE — Anesthesia Postprocedure Evaluation (Signed)
Anesthesia Post Note  Patient: [name]  Procedure(s) Performed: XI ROBOTIC ASSISTED LAPAROSCOPIC CHOLECYSTECTOMY (Abdomen)  Patient location during evaluation: Phase II Anesthesia Type: General Level of consciousness: awake Pain management: pain level controlled Vital Signs Assessment: post-procedure vital signs reviewed and stable Respiratory status: spontaneous breathing and respiratory function stable Cardiovascular status: blood pressure returned to baseline and stable Postop Assessment: no headache and no apparent nausea or vomiting Anesthetic complications: no Comments: Late entry   No notable events documented.   Last Vitals:  Vitals:   12/24/22 1408 12/24/22 1445  BP: (!) 158/92 (!) 142/80  Pulse:  68  Resp:  18  Temp:  36.7 C  SpO2: 98% 96%    Last Pain:  Vitals:   12/24/22 1445  TempSrc:   PainSc: [place]

## 2022-12-28 ENCOUNTER — Telehealth: Payer: Self-pay | Admitting: *Deleted

## 2022-12-28 NOTE — Telephone Encounter (Signed)
Surgical Date: 12/24/2022 Procedure: XI Robotic Assisted Lap Chole  Received call from patient [phone]~ telephone  Reports that she has increased pain on L side of abdomen following surgery. States that area is tender to palpation, but is not draining any fluids, swollen, red, hot to touch, etc. Denies fever/ chills.   Requested refill on [facility] medication to [facility] [place]. Ok to refill?  Oxycodone 5 mg Q 6 hrs PRN, #16 tabs.

## 2022-12-29 ENCOUNTER — Other Ambulatory Visit (INDEPENDENT_AMBULATORY_CARE_PROVIDER_SITE_OTHER): Payer: Commercial Managed Care - PPO | Admitting: Surgery

## 2022-12-29 DIAGNOSIS — Z09 Encounter for follow-up examination after completed treatment for conditions other than malignant neoplasm: Secondary | ICD-10-CM

## 2022-12-29 MED ORDER — OXYCODONE HCL 5 MG PO TABS: 5.0000 mg | ORAL_TABLET | Freq: Four times a day (QID) | ORAL | 0 refills | Status: DC | PRN

## 2022-12-31 ENCOUNTER — Encounter: Payer: Self-pay | Admitting: Surgery

## 2022-12-31 ENCOUNTER — Ambulatory Visit (INDEPENDENT_AMBULATORY_CARE_PROVIDER_SITE_OTHER): Payer: Commercial Managed Care - PPO | Admitting: Surgery

## 2022-12-31 VITALS — BP 131/85 | HR 84 | Temp 98.1°F | Resp 16 | Ht 62.0 in | Wt 209.0 lb

## 2022-12-31 DIAGNOSIS — Z09 Encounter for follow-up examination after completed treatment for conditions other than malignant neoplasm: Secondary | ICD-10-CM

## 2022-12-31 NOTE — Progress Notes (Signed)
[facility] Note   HPI:  52 y.o. Female presents to clinic for post-op follow-up status post robotic assisted laparoscopic cholecystectomy on 2/8.  Patient states that she is having significant pain in the left side of her abdomen and a pulling sensation around into her left flank.  She is tolerating a diet with intermittent nausea but no vomiting.  She is moving her bowels.  She denies any fevers or chills.  She denies any drainage from her incision sites.  She is taking scheduled Tylenol in addition to as needed oxycodone.  Review of Systems:  All other review of systems: otherwise negative   Vital Signs:  BP 131/85   Pulse 84   Temp 98.1 F (36.7 C) (Oral)   Resp 16   Ht 5' 2"$  (1.575 m)   Wt 209 lb (94.8 kg)   SpO2 95%   BMI 38.23 kg/m    Physical Exam:  Physical Exam Vitals reviewed.  Constitutional:      Appearance: Normal appearance.  Eyes:     General: No scleral icterus. Abdominal:     Comments: Abdomen soft, nondistended, no percussion tenderness, mild incisional tenderness to palpation; no rigidity, guarding, rebound tenderness; incisions healing well with Dermabond in place, no palpable hernia defects at incision sites, mild ecchymosis at incision sites  Neurological:     Mental Status: She is alert.     Laboratory studies: None  Imaging:  None  Assessment:  52 y.o. yo Female who presents for follow-up status post robotic assisted laparoscopic cholecystectomy on 2/8  Plan:  -I advised the patient that while she has a fair amount of pain, I do not believe she has any acute issues or complications occurring at this time.  Her vital signs are stable, she is afebrile, incision sites are healing well, she is tolerating a diet and moving her bowels. -I explained that I removed her gallbladder through her left most incision, and therefore I put a stitch through her abdominal wall to close her fascia.  This is likely what is causing a pulling  sensation that goes into her left flank.  This will improve with time -Advised her to continue taking as needed oxycodone, refill provided earlier this week -Also advised her to take scheduled Tylenol and ibuprofen.  She can alternate these every 3 hours -Since she is only 1 week out from surgery, Dermabond will likely start flaking off over the next week -Follow up with me in 2 weeks  All of the above recommendations were discussed with the patient, and all of patient's questions were answered to her expressed satisfaction.  Graciella Freer, DO [facility] [address] [place], [place] [address] [phone] (office)

## 2022-12-31 NOTE — Patient Instructions (Signed)
Alternate taking Tylenol and Advil every 3 hours

## 2023-01-01 ENCOUNTER — Telehealth: Payer: Self-pay | Admitting: Family Medicine

## 2023-01-01 NOTE — Telephone Encounter (Signed)
FMLA paperwork completed and faxed to [employer] [phone]. Confirmation received.   Out of work 12/24/2022 and may return unrestricted on 01/04/2023.  DX: Calculus of gallbladder with acute cholecystitis.  K80.20

## 2023-01-07 ENCOUNTER — Encounter: Payer: Commercial Managed Care - PPO | Admitting: Surgery

## 2023-01-12 ENCOUNTER — Ambulatory Visit: Payer: Commercial Managed Care - PPO | Admitting: Urology

## 2023-01-12 ENCOUNTER — Ambulatory Visit (HOSPITAL_BASED_OUTPATIENT_CLINIC_OR_DEPARTMENT_OTHER)
Admission: RE | Admit: 2023-01-12 | Discharge: 2023-01-12 | Disposition: A | Discharge status: Home / Self Care | Payer: Commercial Managed Care - PPO | Source: Ambulatory Visit | Attending: Urology | Admitting: Urology

## 2023-01-12 ENCOUNTER — Encounter: Payer: Self-pay | Admitting: Urology

## 2023-01-12 VITALS — BP 149/82 | HR 80 | Ht 62.0 in | Wt 211.0 lb

## 2023-01-12 DIAGNOSIS — N200 Calculus of kidney: Secondary | ICD-10-CM

## 2023-01-12 LAB — URINALYSIS
Bilirubin, UA: NEGATIVE
Blood, UA: NEGATIVE
Glucose, UA: NEGATIVE mg/dL
Ketones, UA: NEGATIVE
Leukocytes, UA: NEGATIVE
Nitrite, UA: NEGATIVE
Protein, UA: NEGATIVE
Spec Grav, UA: 1.02 (ref 1.010–1.025)
Urobilinogen, UA: 0.2 E.U./dL
pH, UA: 5.5 (ref 5.0–8.0)

## 2023-01-12 NOTE — Progress Notes (Signed)
Assessment: 1. Nephrolithiasis     Plan: KUB reviewed today.  No obvious calcifications seen in the area of the right renal shadow or along the expected course of the right ureter.  Left renal calcifications appear stable. Stone prevention discussed. 24-hour urine for metabolic evaluation ordered. Renal ultrasound in 1 month. Will contact her with results and to arrange for follow-up.   Chief Complaint:  Chief Complaint  Patient presents with   Nephrolithiasis    History of Present Illness:  KRISTANA EBERLY is a 52 y.o. female who is seen for further evaluation of a right ureteral calculus and nephrolithiasis.  She has a history of nephrolithiasis and has previously been treated with a right ureteral stent followed by lithotripsy.  She had a right ureteral stent placed while in [place] for a 9 x 7 mm calculus at the right UPJ with associated mild hydronephrosis.  She underwent right ESL on 08/04/2022 and her stent was removed on 08/12/2022.  Posttreatment KUB from 08/25/2022 showed no obvious calcifications in the area of the right renal shadow or along the expected course of the right ureter and a left renal calculus.   She presented to the emergency room on 11/13/2022 with right-sided pain.  No fevers or chills.  She was having some associated nausea without vomiting.  Evaluation in the emergency room demonstrated a elevated white blood cell count to 12 K.  Renal function was normal. Urinalysis showed 0-5 RBCs, 6-10 WBCs and rare bacteria.  CT renal stone study showed a 4 mm calculus in the proximal right ureter with mild right-sided hydroureter and adjacent 2 mm and 3 mm nonobstructing calculi in the mid left kidney.  She  had continued right-sided flank pain and nausea despite taking pain medication and antiemetics.  She underwent cystoscopy, right retrograde pyelogram, attempted right uteroscopy, and insertion of a right ureteral stent on 11/17/2022.  Unfortunately, the flexible  ureteroscope was not able to pass into the proximal ureter easily. She is s/p right ESL on 12/07/22. Her right ureteral stent was removed on 12/15/22.  She returns today for follow-up.  She is doing well following her stent removal.  She is unaware of passing stone fragments.  She is having some left sided abdominal pain following her laparoscopic cholecystectomy.  No right flank pain.  No dysuria or gross hematuria.  Portions of the above documentation were copied from a prior visit for review purposes only.   Past Medical History:  Past Medical History:  Diagnosis Date   ADHD (attention deficit hyperactivity disorder)    Follows w/ PCP, Dr. Jenna Luo, [place] 07/07/22.   Anxiety    Follows w/ PCP, Dr. Jenna Luo, [place] 07/07/22.   Asthma    Follows w/ PCP, Dr. Jenna Luo, [place] 07/07/22.   Back pain    low back pain with right-sided sciatica   Chest pain    Chronic CHF [facility])    Follows w/ cardiologist Dr. Grayland Jack, see most recent [place] on 05/08/22 in [place].   Coronary artery disease    non-obstructive, cardiologist Dr. Grayland Jack, [place] 05/08/22. 02/13/22 CT Coronary in Epic, 05/30/21 echo in [place], 06/04/22 EKG in [place], 08/18/18 heart cath in [place], [place] w/ cardiology 05/08/22   Does use hearing aid    Dyspnea    resolved per pt as of 11/17/2022   Echocardiogram 10/25/2015   EF 60-65, normal wall motion, grade 1 diastolic dysfunction, GLS -19.1%   Edema of left lower extremity    Fracture dislocation  of elbow joint 01/15/2013   right   GERD (gastroesophageal reflux disease)    Omeprazole as needed, none in 6 mos.   History of echocardiogram 05/30/2021   EF 65 - 70%   History of kidney stones    Hyperlipidemia    Follows w/ PCP, Dr. Jenna Luo @ [place].   Hypertension    Follows w/ PCP, Dr. Vernell Barrier [facility]. See OV dated 07/07/2022.   Joint pain    Obesity    Follows with Dr. Dennard Nip at [facility].    Palpitations    Cardiologist, Dr. Grayland Jack.   Pre-diabetes    Follows with [facility], Dr. Dennard Nip, MD, [place] 09/16/22 in Epic.   R/L Cardiac catheterization 08/18/2018   Normal coronary arteries, mean RA 6, mean PA 17, PCWP 10   Seborrheic dermatitis    Sleep apnea    has not been using CPAP for 2 or 3 months per pt on 11/17/22, new one is ordered   Vitamin D deficiency    Wears glasses     Past Surgical History:  Past Surgical History:  Procedure Laterality Date   [place]; 10/10/2000   x 2   CYSTOSCOPY WITH RETROGRADE PYELOGRAM, URETEROSCOPY AND STENT PLACEMENT Right 11/18/2022   Procedure: CYSTOSCOPY WITH RETROGRADE PYELOGRAM AND STENT PLACEMENT;  Surgeon: Primus Bravo., MD;  Location: [facility];  Service: Urology;  Laterality: Right;   EXTRACORPOREAL SHOCK WAVE LITHOTRIPSY Right 08/04/2022   Procedure: EXTRACORPOREAL SHOCK WAVE LITHOTRIPSY (ESWL);  Surgeon: Cleon Gustin, MD;  Location: [facility];  Service: Urology;  Laterality: Right;   EXTRACORPOREAL SHOCK WAVE LITHOTRIPSY Right 12/07/2022   Procedure: EXTRACORPOREAL SHOCK WAVE LITHOTRIPSY (ESWL);  Surgeon: Primus Bravo., MD;  Location: [facility];  Service: Urology;  Laterality: Right;   HOLMIUM LASER APPLICATION Right [id number]   Procedure: HOLMIUM LASER APPLICATION/aborted;  Surgeon: Primus Bravo., MD;  Location: [facility];  Service: Urology;  Laterality: Right;   KNEE ARTHROSCOPY Left 1991   also in 2019   LAPAROSCOPIC ASSISTED VAGINAL HYSTERECTOMY  11/02/2006   LAPAROSCOPIC LYSIS OF ADHESIONS  03/06/2005   RADIAL HEAD ARTHROPLASTY Right 01/17/2013   Procedure: RIGHT RADIAL HEAD ARTHROPLASTY OPEN REDUCTION INTERNAL FIXATION CORONOID ;  Surgeon: Tennis Must, MD;  Location: [place];  Service: Orthopedics;  Laterality: Right;   RIGHT/LEFT HEART CATH AND CORONARY ANGIOGRAPHY N/A 08/18/2018    Procedure: RIGHT/LEFT HEART CATH AND CORONARY ANGIOGRAPHY;  Surgeon: Nelva Bush, MD;  Location: [place] [facility];  Service: Cardiovascular;  Laterality: N/A;   TUBAL LIGATION  03/06/2005    Allergies:  Allergies  Allergen Reactions   Adhesive [Tape] Rash   Latex Rash    Family History:  Family History  Problem Relation Age of Onset   Diabetes Sister    Heart disease Father 82       MI--1st Dx CAD. Died2that time   Sudden death Father    Diabetes Mother    Hypertension Mother    Heart disease Mother    Obesity Mother    Heart disease Maternal Uncle    Ovarian cancer Maternal Grandmother    Diabetes Maternal Grandmother    Heart disease Maternal Grandmother        Great Grandmother   Diabetes Paternal Grandmother    Liver disease Paternal Aunt    Heart disease Maternal Grandfather  Social History:  Social History   Tobacco Use   Smoking status: Never    Passive exposure: Past   Smokeless tobacco: Never  Vaping Use   Vaping Use: Never used  Substance Use Topics   Alcohol use: No   Drug use: No    ROS: Constitutional:  Negative for fever, chills, weight loss CV: Negative for chest pain, previous MI, hypertension Respiratory:  Negative for shortness of breath, wheezing, sleep apnea, frequent cough GI:  Negative for nausea, vomiting, bloody stool, GERD  Physical exam: BP (!) 149/82   Pulse 80   Ht '5\' 2"'$  (1.575 m)   Wt 211 lb (95.7 kg)   BMI 38.59 kg/m  GENERAL APPEARANCE:  Well appearing, well developed, well nourished, NAD HEENT:  Atraumatic, normocephalic, oropharynx clear NECK:  Supple without lymphadenopathy or thyromegaly ABDOMEN:  Soft, non-tender, no masses EXTREMITIES:  Moves all extremities well, without clubbing, cyanosis, or edema NEUROLOGIC:  Alert and oriented x 3, normal gait, CN II-XII grossly intact MENTAL STATUS:  appropriate BACK:  Non-tender to palpation, No CVAT SKIN:  Warm, dry, and intact  Results: U/A dipstick:  negative

## 2023-01-13 ENCOUNTER — Encounter: Payer: Self-pay | Admitting: Surgery

## 2023-01-13 ENCOUNTER — Ambulatory Visit (INDEPENDENT_AMBULATORY_CARE_PROVIDER_SITE_OTHER): Payer: Commercial Managed Care - PPO | Admitting: Surgery

## 2023-01-13 VITALS — BP 139/83 | HR 85 | Temp 97.9°F | Resp 14 | Ht 62.0 in | Wt 220.0 lb

## 2023-01-13 DIAGNOSIS — Z09 Encounter for follow-up examination after completed treatment for conditions other than malignant neoplasm: Secondary | ICD-10-CM

## 2023-01-13 DIAGNOSIS — T8130XA Disruption of wound, unspecified, initial encounter: Secondary | ICD-10-CM

## 2023-01-13 NOTE — Patient Instructions (Signed)
Wound Care Instructions:  -Pack the wound daily with iodoform gauze after bathing -May stop packing when wound no longer can hold packing -Ok to let water run over incision in shower -May sure wound is dry prior to replacing packing

## 2023-01-13 NOTE — Progress Notes (Signed)
[facility] Note   HPI:  52 y.o. Female presents to clinic for post-op follow-up status post robotic assisted laparoscopic cholecystectomy on 2/8.  Patient states that her pain is slowly starting to improve, though she still has pain at her left lateral incision site with turning in certain movements.  She is tolerating a diet without nausea and vomiting, though she does intermittently get random episodes of nausea.  She is moving her bowels without issue.  She denies any right-sided abdominal pain.  She also noted that her right sided incision opened up and started draining yellowish fluid on Friday.  She denies any fevers or chills.  Review of Systems:  All other review of systems: otherwise negative   Vital Signs:  BP 139/83   Pulse 85   Temp 97.9 F (36.6 C) (Oral)   Resp 14   Ht '5\' 2"'$  (1.575 m)   Wt 220 lb (99.8 kg)   SpO2 97%   BMI 40.24 kg/m    Physical Exam:  Physical Exam Vitals reviewed.  Constitutional:      Appearance: Normal appearance.  Abdominal:     Comments: Abdomen soft, nondistended, no percussion tenderness, nontender to palpation; no rigidity, guarding, rebound tenderness; incisions healing well, right lateral incision has dehisced with small amount of fibrinous exudate  Neurological:     Mental Status: She is alert.     Laboratory studies: None  Imaging:  None  Pathology:  A. 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 yo Female who presents for follow-up status post robotic assisted laparoscopic cholecystectomy on 2/8.  Plan:  -Patient overall doing well with improvement in her pain.  She is tolerating a diet and moving her bowels without issue -Advised that if she is still having random episodes of nausea and vomiting at her next follow-up appointment, we will refer her over to GI for evaluation, as I  do not think her nausea or vomiting is related to gallbladder disease at this time -I debrided her right lateral incision site and removed all fibrinous exudate.  I then packed it with quarter inch iodoform packing.  Advised her to perform packing changes daily after bathing.  Is okay for soapy water to run over the incision, but she should try the incision prior to repacking -Follow up with me in 2 weeks  All of the above recommendations were discussed with the patient, and all of patient's questions were answered to her expressed satisfaction.  Graciella Freer, DO [facility] [address] [place], [place] [address] [phone] (office)

## 2023-01-14 ENCOUNTER — Telehealth (HOSPITAL_BASED_OUTPATIENT_CLINIC_OR_DEPARTMENT_OTHER): Payer: Self-pay

## 2023-01-27 ENCOUNTER — Encounter: Payer: Self-pay | Admitting: Surgery

## 2023-01-27 ENCOUNTER — Ambulatory Visit (INDEPENDENT_AMBULATORY_CARE_PROVIDER_SITE_OTHER): Payer: Commercial Managed Care - PPO | Admitting: Surgery

## 2023-01-27 VITALS — BP 112/76 | HR 108 | Temp 97.9°F | Resp 14 | Ht 62.0 in | Wt 217.0 lb

## 2023-01-27 DIAGNOSIS — T8130XA Disruption of wound, unspecified, initial encounter: Secondary | ICD-10-CM

## 2023-01-27 NOTE — Progress Notes (Signed)
[facility] Note   HPI:  52 y.o. Female presents to clinic for post-op follow-up status post robotic assisted laparoscopic cholecystectomy on 2/8.  Her pain has significantly improved since her last follow-up visit.  She will only occasionally have twinges of pain in the left side of her abdomen.  She is tolerating a diet without nausea and vomiting, moving her bowels without issue.  She denies significant drainage from her right lateral port site.  She is no longer packing the area, as it will no longer hold packing.  Denies fevers and chills.  Review of Systems:  All other review of systems: otherwise negative   Vital Signs:  BP 112/76   Pulse (!) 108   Temp 97.9 F (36.6 C) (Oral)   Resp 14   Ht 5\' 2"  (1.575 m)   Wt 217 lb (98.4 kg)   SpO2 95%   BMI 39.69 kg/m    Physical Exam:  Physical Exam Vitals reviewed.  Constitutional:      Appearance: Normal appearance.  Abdominal:     Comments: Abdomen soft, nondistended, no percussion tenderness, nontender to palpation; no rigidity, guarding, rebound tenderness right lateral incision site with pink granulation tissue at the base, no significant drainage, other incision sites healing well  Neurological:     Mental Status: She is alert.     Laboratory studies: None  Imaging:  None  Assessment:  52 y.o. yo Female who presents for follow-up status post robotic assisted laparoscopic cholecystectomy on 2/8  Plan:  -Patient is overall doing well.  She is tolerating a diet, moving her bowels, and pain is adequately controlled -Advised her to apply antibiotic ointment to her right lateral incision site until it is fully healed -Advised her to call the office if it has not fully closed within the next month -Follow up as needed  All of the above recommendations were discussed with the patient, and all of patient's questions were answered to her expressed satisfaction.  Graciella Freer, DO [facility] [address] [place], [place] [address] [phone] (office)

## 2023-02-11 ENCOUNTER — Ambulatory Visit (HOSPITAL_BASED_OUTPATIENT_CLINIC_OR_DEPARTMENT_OTHER)
Admission: RE | Admit: 2023-02-11 | Discharge: 2023-02-11 | Disposition: A | Discharge status: Home / Self Care | Payer: Commercial Managed Care - PPO | Source: Ambulatory Visit | Attending: Urology | Admitting: Urology

## 2023-02-11 DIAGNOSIS — N200 Calculus of kidney: Secondary | ICD-10-CM | POA: Insufficient documentation

## 2023-02-11 DIAGNOSIS — N179 Acute kidney failure, unspecified: Secondary | ICD-10-CM | POA: Diagnosis not present

## 2023-02-16 ENCOUNTER — Encounter: Payer: Self-pay | Admitting: Urology

## 2023-03-02 ENCOUNTER — Other Ambulatory Visit (HOSPITAL_COMMUNITY): Payer: Self-pay

## 2023-03-11 ENCOUNTER — Ambulatory Visit (INDEPENDENT_AMBULATORY_CARE_PROVIDER_SITE_OTHER): Payer: Commercial Managed Care - PPO | Admitting: Family Medicine

## 2023-03-11 ENCOUNTER — Encounter (INDEPENDENT_AMBULATORY_CARE_PROVIDER_SITE_OTHER): Payer: Self-pay | Admitting: Family Medicine

## 2023-03-11 VITALS — BP 134/85 | HR 71 | Temp 97.5°F | Ht 63.0 in | Wt 216.0 lb

## 2023-03-11 DIAGNOSIS — R7303 Prediabetes: Secondary | ICD-10-CM | POA: Diagnosis not present

## 2023-03-11 DIAGNOSIS — F3289 Other specified depressive episodes: Secondary | ICD-10-CM | POA: Diagnosis not present

## 2023-03-11 DIAGNOSIS — Z6836 Body mass index (BMI) 36.0-36.9, adult: Secondary | ICD-10-CM | POA: Diagnosis not present

## 2023-03-11 DIAGNOSIS — E669 Obesity, unspecified: Secondary | ICD-10-CM | POA: Diagnosis not present

## 2023-03-16 NOTE — Progress Notes (Unsigned)
Chief Complaint:   OBESITY [name] is here to discuss her progress with her obesity treatment plan along with follow-up of her obesity related diagnoses. [name] is on the Category 3 Plan and practicing portion control and making smarter food choices, such as increasing vegetables and decreasing simple carbohydrates and states she is following her eating plan approximately 50% of the time. [name] states she is doing 0 minutes 0 times per week.  Today's visit was #: 55 Starting weight: 235 lbs Starting date: 11/30/2018 Today's weight: 216 lbs Today's date: 03/11/2023 Total lbs lost to date: 19 Total lbs lost since last in-office visit: 0  Interim History: [name]'s last visit was 5 months ago.  She has had a lot of health issues and she has not been able to concentrate on weight loss.  She is working on getting back on track with her weight loss efforts.  Subjective:   1. Prediabetes [name] is on metformin, and she is working on her diet now but she is struggling to avoid too many simple carbohydrates.  2. Other depression with emtional eating [name] is struggling with increased stress and comfort eating, especially in the evening.  She notes cravings are especially for carbohydrates.  She has a history of nephrolithiasis and cannot take Topamax.  Assessment/Plan:   1. Prediabetes [name] will continue metformin, and she will try to increase back up to 3 times daily.  No refill is needed today.  2. Other depression with emtional eating [name] agreed to start Lexapro 10 mg every morning #30 with no refills.  3. Obesity, Current BMI 36.4 [name] is currently in the action stage of change. As such, her goal is to get back to weightloss efforts . She has agreed to restart the Category 3 Plan.   Behavioral modification strategies: increasing lean protein intake.  [name] has agreed to follow-up with our clinic in 4 weeks. She was informed of the importance of frequent  follow-up visits to maximize her success with intensive lifestyle modifications for her multiple health conditions.   Objective:   Blood pressure 134/85, pulse 71, temperature (!) 97.5 F (36.4 C), height 5\' 3"  (1.6 m), weight 216 lb (98 kg), SpO2 99 %. Body mass index is 38.26 kg/m.  Lab Results  Component Value Date   CREATININE 0.69 11/13/2022   BUN 16 11/13/2022   NA 138 11/13/2022   K 3.7 11/13/2022   CL 105 11/13/2022   CO2 24 11/13/2022   Lab Results  Component Value Date   ALT 42 (H) 07/27/2022   AST 29 07/27/2022   ALKPHOS 113 07/27/2022   BILITOT <0.2 07/27/2022   Lab Results  Component Value Date   HGBA1C 5.5 12/22/2022   HGBA1C 5.5 05/28/2022   HGBA1C 5.6 10/23/2021   HGBA1C 5.5 05/08/2021   HGBA1C 5.6 05/29/2020   Lab Results  Component Value Date   INSULIN 12.5 05/28/2022   INSULIN 9.3 10/23/2021   INSULIN 22.8 05/08/2021   INSULIN 15.2 05/29/2020   INSULIN 10.3 11/27/2019   Lab Results  Component Value Date   TSH 3.820 05/07/2021   Lab Results  Component Value Date   CHOL 122 05/08/2022   HDL 41 05/08/2022   LDLCALC 64 05/08/2022   TRIG 89 05/08/2022   CHOLHDL 3.0 05/08/2022   Lab Results  Component Value Date   VD25OH 59.4 05/28/2022   VD25OH 39.1 10/23/2021   VD25OH 39.3 05/08/2021   Lab Results  Component Value Date   WBC 12.1 (H) 11/13/2022  HGB 13.5 11/13/2022   HCT 40.5 11/13/2022   MCV 87.1 11/13/2022   PLT 292 11/13/2022   No results found for: "IRON", "TIBC", "FERRITIN"  Attestation Statements:   Reviewed by clinician on day of visit: allergies, medications, problem list, medical history, surgical history, family history, social history, and previous encounter notes.   I, Burt Knack, am acting as transcriptionist for Quillian Quince, MD.  I have reviewed the above documentation for accuracy and completeness, and I agree with the above. -  Quillian Quince, MD

## 2023-03-17 ENCOUNTER — Other Ambulatory Visit (HOSPITAL_COMMUNITY): Payer: Self-pay

## 2023-03-17 MED ORDER — ESCITALOPRAM OXALATE 10 MG PO TABS
10.0000 mg | ORAL_TABLET | Freq: Every morning | ORAL | 0 refills | Status: DC
  Filled 2023-03-17: qty 30, 30d supply, fill #0

## 2023-04-04 ENCOUNTER — Other Ambulatory Visit: Payer: Self-pay | Admitting: Urology

## 2023-04-04 DIAGNOSIS — N200 Calculus of kidney: Secondary | ICD-10-CM | POA: Diagnosis not present

## 2023-04-09 LAB — LITHOLINK 24HR URINE PANEL
Ammonium, Urine: 39 mmol/24 hr (ref 15–60)
Calcium Oxalate Saturation: 9.5 (ref 6.00–10.00)
Calcium Phosphate Saturation: 0.61 (ref 0.50–2.00)
Calcium, Urine: 153 mg/24 hr (ref <200)
Calcium/Creatinine Ratio: 100 mg/g creat (ref 51–262)
Calcium/Kg Body Weight: 1.6 mg/24 hr/kg (ref <4.0)
Chloride, Urine: 119 mmol/24 hr (ref 70–250)
Citrate, Urine: 621 mg/24 hr (ref >550)
Creatinine, Urine: 1537 mg/24 hr
Creatinine/Kg Body Weight: 15.8 mg/24 hr/kg (ref 8.7–20.3)
Cystine, Urine, Qualitative: NEGATIVE
Magnesium, Urine: 66 mg/24 hr (ref 30–120)
Oxalate, Urine: 34 mg/24 hr (ref 20–40)
Phosphorus, Urine: 1104 mg/24 hr (ref 600–1200)
Potassium, Urine: 68 mmol/24 hr (ref 20–100)
Protein Catabolic Rate: 0.9 g/kg/24 hr (ref 0.8–1.4)
Sodium, Urine: 113 mmol/24 hr (ref 50–150)
Sulfate, Urine: 38 meq/24 hr (ref 20–80)
Urea Nitrogen, Urine: 11.36 g/24 hr (ref 6.00–14.00)
Uric Acid Saturation: 3.48 — ABNORMAL HIGH (ref <1.00)
Uric Acid, Urine: 807 mg/24 hr — ABNORMAL HIGH (ref <750)
Urine Volume (Preserved): 1120 mL/24 hr (ref 500–4000)
pH, 24 hr, Urine: 5.45 — ABNORMAL LOW (ref 5.800–6.200)

## 2023-04-14 ENCOUNTER — Encounter: Payer: Self-pay | Admitting: Urology

## 2023-04-15 ENCOUNTER — Encounter (INDEPENDENT_AMBULATORY_CARE_PROVIDER_SITE_OTHER): Payer: Self-pay | Admitting: Family Medicine

## 2023-04-15 ENCOUNTER — Ambulatory Visit (INDEPENDENT_AMBULATORY_CARE_PROVIDER_SITE_OTHER): Payer: Commercial Managed Care - PPO | Admitting: Family Medicine

## 2023-04-15 ENCOUNTER — Other Ambulatory Visit (HOSPITAL_COMMUNITY): Payer: Self-pay

## 2023-04-15 VITALS — BP 133/82 | HR 71 | Temp 98.1°F | Ht 63.0 in | Wt 220.0 lb

## 2023-04-15 DIAGNOSIS — R7303 Prediabetes: Secondary | ICD-10-CM | POA: Diagnosis not present

## 2023-04-15 DIAGNOSIS — Z6838 Body mass index (BMI) 38.0-38.9, adult: Secondary | ICD-10-CM | POA: Diagnosis not present

## 2023-04-15 DIAGNOSIS — E669 Obesity, unspecified: Secondary | ICD-10-CM | POA: Diagnosis not present

## 2023-04-15 DIAGNOSIS — F418 Other specified anxiety disorders: Secondary | ICD-10-CM | POA: Diagnosis not present

## 2023-04-15 MED ORDER — ESCITALOPRAM OXALATE 20 MG PO TABS
20.0000 mg | ORAL_TABLET | Freq: Every morning | ORAL | 0 refills | Status: DC
  Filled 2023-04-15: qty 30, 30d supply, fill #0

## 2023-04-15 MED ORDER — METFORMIN HCL 1000 MG PO TABS
1000.0000 mg | ORAL_TABLET | Freq: Every day | ORAL | 0 refills | Status: DC
  Filled 2023-04-15: qty 30, 30d supply, fill #0

## 2023-04-15 NOTE — Progress Notes (Deleted)
Discussed the use of AI scribe software for clinical note transcription with the patient, who gave verbal consent to proceed.  History of Present Illness             Assessment and Plan

## 2023-04-15 NOTE — Progress Notes (Signed)
.[facility]  Office: [phone]  /  Fax: [phone]  WEIGHT SUMMARY AND BIOMETRICS   Anthropometric Measurements Height: 5\' 3"  (1.6 m) Weight: 220 lb (99.8 kg) BMI (Calculated): 38.98 Weight at Last Visit: 216lb Weight Lost Since Last Visit: 0lb Weight Gained Since Last Visit: 4lb Starting Weight: 235lb   Body Composition  Body Fat %: 47.4 % Fat Mass (lbs): 104.4 lbs Muscle Mass (lbs): 110.2 lbs Total Body Water (lbs): 91.4 lbs Visceral Fat Rating : 13   Other Clinical Data Fasting: no Labs: no Today's Visit #: 56 Starting Date: 11/27/18     HPI  Chief Complaint: OBESITY  [name] is here to discuss her progress with her obesity treatment plan. She is on the the Category 3 Plan and states she is following her eating plan approximately 60 % of the time. She states she is exercising 0 minutes 0 times per week.  Discussed the use of AI scribe software for clinical note transcription with the patient, who gave verbal consent to proceed.  History of Present Illness   The patient, with a BMI of 39, has been experiencing increased stress over the past week due to personal family issues. This stress has been affecting their sleep and eating habits. SHe has gained 4 lbs in the last month. They have been taking Karie Mainland Stress Free pills and Xanax at night for stress relief. They have also been on a low dose of Lexapro, which they report has not made a significant difference in their stress levels and may have increased their appetite.  The patient has been trying to maintain a healthy diet, consuming boiled eggs and Fairlife milk for breakfast, lean cuisines for lunch, and making healthier choices for dinner. However, they report feeling hungry again around 9:30-10:00 AM after having breakfast at around 7:00 AM.  They have been taking Metformin once a day with breakfast, which was reduced from three times a day due to stomach upset. She notes increased polyphagia.  The patient's  husband is expected to be laid off by the end of June, adding to their stress. Despite these challenges, the patient is striving to improve their health and manage their weight.         PHYSICAL EXAM:  Blood pressure 133/82, pulse 71, temperature 98.1 F (36.7 C), height 5\' 3"  (1.6 m), weight 220 lb (99.8 kg), SpO2 98 %. Body mass index is 38.97 kg/m. Physical Exam   MEASUREMENTS: BMI- 39      DIAGNOSTIC DATA REVIEWED:  BMET    Component Value Date/Time   NA 138 11/13/2022 2354   NA 141 07/27/2022 1234   K 3.7 11/13/2022 2354   CL 105 11/13/2022 2354   CO2 24 11/13/2022 2354   GLUCOSE 135 (H) 11/13/2022 2354   BUN 16 11/13/2022 2354   BUN 10 07/27/2022 1234   CREATININE 0.69 11/13/2022 2354   CREATININE 0.63 12/24/2017 0818   CALCIUM 9.6 11/13/2022 2354   GFRNONAA >60 11/13/2022 2354   GFRNONAA 81 07/23/2022 1411   GFRNONAA 109 12/24/2017 0818   GFRAA 123 10/03/2020 1351   GFRAA 126 12/24/2017 0818   Lab Results  Component Value Date   HGBA1C 5.5 12/22/2022   HGBA1C 6.0 (H) 11/30/2018   Lab Results  Component Value Date   INSULIN 12.5 05/28/2022   INSULIN 14.5 11/30/2018   Lab Results  Component Value Date   TSH 3.820 05/07/2021   CBC    Component Value Date/Time   WBC 12.1 (H) 11/13/2022 2354  RBC 4.65 11/13/2022 2354   HGB 13.5 11/13/2022 2354   HGB 15.3 05/07/2021 1515   HCT 40.5 11/13/2022 2354   HCT 45.6 05/07/2021 1515   PLT 292 11/13/2022 2354   PLT 409 05/07/2021 1515   MCV 87.1 11/13/2022 2354   MCV 86 05/07/2021 1515   MCH 29.0 11/13/2022 2354   MCHC 33.3 11/13/2022 2354   RDW 12.9 11/13/2022 2354   RDW 13.1 05/07/2021 1515   Iron Studies No results found for: "IRON", "TIBC", "FERRITIN", "IRONPCTSAT" Lipid Panel     Component Value Date/Time   CHOL 122 05/08/2022 0919   TRIG 89 05/08/2022 0919   HDL 41 05/08/2022 0919   CHOLHDL 3.0 05/08/2022 0919   CHOLHDL 4.2 12/24/2017 0818   VLDL 22 03/11/2015 0847   LDLCALC 64 05/08/2022  0919   LDLCALC 134 (H) 12/24/2017 0818   Hepatic Function Panel     Component Value Date/Time   PROT 6.3 07/27/2022 1234   ALBUMIN 3.7 (L) 07/27/2022 1234   AST 29 07/27/2022 1234   ALT 42 (H) 07/27/2022 1234   ALKPHOS 113 07/27/2022 1234   BILITOT <0.2 07/27/2022 1234      Component Value Date/Time   TSH 3.820 05/07/2021 1515   Nutritional Lab Results  Component Value Date   VD25OH 59.4 05/28/2022   VD25OH 39.1 10/23/2021   VD25OH 39.3 05/08/2021    ASSOCIATED CONDITIONS ADDRESSED TODAY Assessment and Plan    Obesity: BMI 39. Increased stress and hunger reported. Currently on Lexapro 10mg  with no perceived benefit and possible increased hunger. -Increase Lexapro to 20mg  daily. -Encourage 30g of protein in the morning to help control hunger signals.  Prediabetes: Currently taking 1 tablet in the morning. Reports increased hunger in the morning despite eating breakfast. -Increase Metformin to 2 tablets (1000mg ) in the morning to help control hunger signals.  Anxiety: Reports increased stress due to personal circumstances. Currently taking Xanax at night and unsure about interaction with Lexapro. -Confirmed that Xanax and Lexapro can be taken together. -Encourage continued use of stress relief strategies. -Increase Lexapro to 20mg  daily.  General Health Maintenance: -Continue monitoring stress levels and eating habits. -Check-in if any issues arise with increased Lexapro or Metformin dosing.     Marland Kitchen She was informed of the importance of frequent follow up visits to maximize her success with intensive lifestyle modifications for her multiple health conditions. Follow up in 4 weeks.  I have personally spent 44 minutes total time today in preparation, patient care, and documentation for this visit, including the following: review of clinical lab tests; review of medical tests/procedures/services.  Quillian Quince, MD

## 2023-04-16 ENCOUNTER — Other Ambulatory Visit: Payer: Self-pay

## 2023-04-20 ENCOUNTER — Other Ambulatory Visit: Payer: Self-pay

## 2023-04-20 ENCOUNTER — Other Ambulatory Visit: Payer: Self-pay | Admitting: Cardiovascular Disease

## 2023-04-20 ENCOUNTER — Other Ambulatory Visit (HOSPITAL_COMMUNITY): Payer: Self-pay

## 2023-04-20 DIAGNOSIS — N890 Mild vaginal dysplasia: Secondary | ICD-10-CM | POA: Diagnosis not present

## 2023-04-20 DIAGNOSIS — Z01419 Encounter for gynecological examination (general) (routine) without abnormal findings: Secondary | ICD-10-CM | POA: Diagnosis not present

## 2023-04-20 DIAGNOSIS — N959 Unspecified menopausal and perimenopausal disorder: Secondary | ICD-10-CM | POA: Diagnosis not present

## 2023-04-20 DIAGNOSIS — Z6839 Body mass index (BMI) 39.0-39.9, adult: Secondary | ICD-10-CM | POA: Diagnosis not present

## 2023-04-20 DIAGNOSIS — Z1231 Encounter for screening mammogram for malignant neoplasm of breast: Secondary | ICD-10-CM | POA: Diagnosis not present

## 2023-04-20 DIAGNOSIS — N952 Postmenopausal atrophic vaginitis: Secondary | ICD-10-CM | POA: Diagnosis not present

## 2023-04-20 MED ORDER — ENTRESTO 24-26 MG PO TABS
1.0000 | ORAL_TABLET | Freq: Two times a day (BID) | ORAL | 0 refills | Status: DC
  Filled 2023-04-20: qty 60, 30d supply, fill #0

## 2023-05-11 ENCOUNTER — Ambulatory Visit (INDEPENDENT_AMBULATORY_CARE_PROVIDER_SITE_OTHER): Payer: Commercial Managed Care - PPO | Admitting: Family Medicine

## 2023-05-11 ENCOUNTER — Encounter (INDEPENDENT_AMBULATORY_CARE_PROVIDER_SITE_OTHER): Payer: Self-pay | Admitting: Family Medicine

## 2023-05-11 ENCOUNTER — Other Ambulatory Visit: Payer: Self-pay

## 2023-05-11 ENCOUNTER — Other Ambulatory Visit (HOSPITAL_COMMUNITY): Payer: Self-pay

## 2023-05-11 VITALS — BP 118/71 | HR 72 | Temp 98.2°F | Ht 63.0 in | Wt 221.0 lb

## 2023-05-11 DIAGNOSIS — Z6839 Body mass index (BMI) 39.0-39.9, adult: Secondary | ICD-10-CM

## 2023-05-11 DIAGNOSIS — R7303 Prediabetes: Secondary | ICD-10-CM | POA: Diagnosis not present

## 2023-05-11 DIAGNOSIS — R4689 Other symptoms and signs involving appearance and behavior: Secondary | ICD-10-CM

## 2023-05-11 DIAGNOSIS — E669 Obesity, unspecified: Secondary | ICD-10-CM | POA: Diagnosis not present

## 2023-05-11 DIAGNOSIS — F3289 Other specified depressive episodes: Secondary | ICD-10-CM

## 2023-05-11 MED ORDER — METFORMIN HCL 1000 MG PO TABS
1000.0000 mg | ORAL_TABLET | Freq: Every day | ORAL | 0 refills | Status: DC
  Filled 2023-05-11: qty 30, 30d supply, fill #0

## 2023-05-11 MED ORDER — ESCITALOPRAM OXALATE 20 MG PO TABS
20.0000 mg | ORAL_TABLET | Freq: Every morning | ORAL | 0 refills | Status: DC
  Filled 2023-05-11: qty 30, 30d supply, fill #0

## 2023-05-12 ENCOUNTER — Other Ambulatory Visit: Payer: Self-pay

## 2023-05-12 NOTE — Progress Notes (Unsigned)
Chief Complaint:   OBESITY [name] is here to discuss her progress with her obesity treatment plan along with follow-up of her obesity related diagnoses. [name] is on the Category 3 Plan and states she is following her eating plan approximately 50% of the time. [name] states she is doing 0 minutes 0 times per week.  Today's visit was #: 57 Starting weight: 235 lbs Starting date: 11/30/2018 Today's weight: 221 lbs Today's date: 05/11/2023 Total lbs lost to date: 14 Total lbs lost since last in-office visit: 0  Interim History: Patient has been struggling to follow her eating plan.  She notes eating out more and deviating from her plan but she is still mindful of her choices.  Subjective:   1. Pre-diabetes Patient is on metformin, and she denies nausea or vomiting.  She is working on decreasing simple carbohydrates.  2. Emotional Eating Behavior Patient is on Lexapro, and she is working on decreasing emotional eating behavior.  No side effects were noted.  Assessment/Plan:   1. Pre-diabetes Patient will continue with her diet, exercise, and metformin and we will refill metformin for 1 month.  - metFORMIN (GLUCOPHAGE) 1000 MG tablet; Take 1 tablet (1,000 mg total) by mouth daily with breakfast.  Dispense: 30 tablet; Refill: 0  2. Emotional Eating Behavior Patient will continue Lexapro 20 mg, and we will refill for 1 month.  - escitalopram (LEXAPRO) 20 MG tablet; Take 1 tablet (20 mg total) by mouth in the morning.  Dispense: 30 tablet; Refill: 0  3. BMI 39.0-39.9,adult  4. Obesity, Beginning BMI 41.63 [name] is currently in the action stage of change. As such, her goal is to continue with weight loss efforts. She has agreed to the Category 3 Plan.   Behavioral modification strategies: decreasing eating out and meal planning and cooking strategies.  [name] has agreed to follow-up with our clinic in 4 weeks. She was informed of the importance of frequent follow-up  visits to maximize her success with intensive lifestyle modifications for her multiple health conditions.   Objective:   Blood pressure 118/71, pulse 72, temperature 98.2 F (36.8 C), height 5\' 3"  (1.6 m), weight 221 lb (100.2 kg), SpO2 97 %. Body mass index is 39.15 kg/m.  Lab Results  Component Value Date   CREATININE 0.69 11/13/2022   BUN 16 11/13/2022   NA 138 11/13/2022   K 3.7 11/13/2022   CL 105 11/13/2022   CO2 24 11/13/2022   Lab Results  Component Value Date   ALT 42 (H) 07/27/2022   AST 29 07/27/2022   ALKPHOS 113 07/27/2022   BILITOT <0.2 07/27/2022   Lab Results  Component Value Date   HGBA1C 5.5 12/22/2022   HGBA1C 5.5 05/28/2022   HGBA1C 5.6 10/23/2021   HGBA1C 5.5 05/08/2021   HGBA1C 5.6 05/29/2020   Lab Results  Component Value Date   INSULIN 12.5 05/28/2022   INSULIN 9.3 10/23/2021   INSULIN 22.8 05/08/2021   INSULIN 15.2 05/29/2020   INSULIN 10.3 11/27/2019   Lab Results  Component Value Date   TSH 3.820 05/07/2021   Lab Results  Component Value Date   CHOL 122 05/08/2022   HDL 41 05/08/2022   LDLCALC 64 05/08/2022   TRIG 89 05/08/2022   CHOLHDL 3.0 05/08/2022   Lab Results  Component Value Date   VD25OH 59.4 05/28/2022   VD25OH 39.1 10/23/2021   VD25OH 39.3 05/08/2021   Lab Results  Component Value Date   WBC 12.1 (H) 11/13/2022  HGB 13.5 11/13/2022   HCT 40.5 11/13/2022   MCV 87.1 11/13/2022   PLT 292 11/13/2022   No results found for: "IRON", "TIBC", "FERRITIN"  Attestation Statements:   Reviewed by clinician on day of visit: allergies, medications, problem list, medical history, surgical history, family history, social history, and previous encounter notes.   I, Burt Knack, am acting as transcriptionist for Quillian Quince, MD.  I have reviewed the above documentation for accuracy and completeness, and I agree with the above. -  Quillian Quince, MD

## 2023-06-09 ENCOUNTER — Other Ambulatory Visit (HOSPITAL_COMMUNITY): Payer: Self-pay

## 2023-06-09 ENCOUNTER — Encounter (INDEPENDENT_AMBULATORY_CARE_PROVIDER_SITE_OTHER): Payer: Self-pay | Admitting: Family Medicine

## 2023-06-09 ENCOUNTER — Ambulatory Visit (INDEPENDENT_AMBULATORY_CARE_PROVIDER_SITE_OTHER): Payer: Commercial Managed Care - PPO | Admitting: Family Medicine

## 2023-06-09 VITALS — BP 110/71 | HR 68 | Temp 98.1°F | Ht 63.0 in | Wt 220.0 lb

## 2023-06-09 DIAGNOSIS — Z6839 Body mass index (BMI) 39.0-39.9, adult: Secondary | ICD-10-CM

## 2023-06-09 DIAGNOSIS — Z6838 Body mass index (BMI) 38.0-38.9, adult: Secondary | ICD-10-CM | POA: Diagnosis not present

## 2023-06-09 DIAGNOSIS — F3289 Other specified depressive episodes: Secondary | ICD-10-CM

## 2023-06-09 DIAGNOSIS — R7303 Prediabetes: Secondary | ICD-10-CM

## 2023-06-09 DIAGNOSIS — E669 Obesity, unspecified: Secondary | ICD-10-CM

## 2023-06-09 MED ORDER — ESCITALOPRAM OXALATE 20 MG PO TABS
20.0000 mg | ORAL_TABLET | Freq: Every morning | ORAL | 0 refills | Status: DC
  Filled 2023-06-09: qty 30, 30d supply, fill #0

## 2023-06-09 MED ORDER — METFORMIN HCL 1000 MG PO TABS
1000.0000 mg | ORAL_TABLET | Freq: Every day | ORAL | 0 refills | Status: DC
  Filled 2023-06-09: qty 30, 30d supply, fill #0

## 2023-06-10 ENCOUNTER — Other Ambulatory Visit (HOSPITAL_COMMUNITY): Payer: Self-pay

## 2023-06-11 ENCOUNTER — Other Ambulatory Visit: Payer: Self-pay | Admitting: Family Medicine

## 2023-06-11 ENCOUNTER — Other Ambulatory Visit (HOSPITAL_COMMUNITY): Payer: Self-pay

## 2023-06-11 MED ORDER — ATOMOXETINE HCL 40 MG PO CAPS
40.0000 mg | ORAL_CAPSULE | Freq: Every day | ORAL | 3 refills | Status: DC
  Filled 2023-06-11: qty 30, 30d supply, fill #0
  Filled 2023-08-23: qty 30, 30d supply, fill #1
  Filled 2023-11-29: qty 30, 30d supply, fill #2

## 2023-06-11 NOTE — Telephone Encounter (Signed)
Requested medication (s) are due for refill today - yes  Requested medication (s) are on the active medication list -yes  Future visit scheduled -no  Last refill: 10/16/22 #30 3RF  Notes to clinic: non delegated Rx, (another provider listed as PCP)  Requested Prescriptions  Pending Prescriptions Disp Refills   atomoxetine (STRATTERA) 40 MG capsule 30 capsule 3    Sig: Take 1 capsule (40 mg total) by mouth daily.     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, bilateral   [facility] Pickard, Priscille Heidelberg, MD   4 years ago Prediabetes   [facility] Tanya Nones, Priscille Heidelberg, MD   5 years ago Encounter for preventive health examination   [facility] Dorena Bodo, PA-C              Passed - Completed PHQ-2 or PHQ-9 in the last 360 days      Passed - Last BP in normal range    BP Readings from Last 1 Encounters:  06/09/23 110/71         Passed - Last Heart Rate in normal range    Pulse Readings from Last 1 Encounters:  06/09/23 68            Requested Prescriptions  Pending Prescriptions Disp Refills   atomoxetine (STRATTERA) 40 MG capsule 30 capsule 3    Sig: Take 1 capsule (40 mg total) by mouth daily.     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, bilateral   [facility] Pickard, Priscille Heidelberg, MD   4 years ago Prediabetes   [facility] Tanya Nones, Priscille Heidelberg, MD   5 years ago Encounter for preventive health examination   [facility] Dorena Bodo, PA-C              Passed - Completed PHQ-2 or PHQ-9 in the last 360 days      Passed - Last BP in normal range    BP Readings from Last 1 Encounters:  06/09/23 110/71         Passed - Last Heart Rate in normal range    Pulse Readings from Last 1 Encounters:  06/09/23 68

## 2023-06-12 ENCOUNTER — Other Ambulatory Visit (HOSPITAL_COMMUNITY): Payer: Self-pay

## 2023-06-14 ENCOUNTER — Ambulatory Visit (INDEPENDENT_AMBULATORY_CARE_PROVIDER_SITE_OTHER): Payer: Commercial Managed Care - PPO | Admitting: Orthopedic Surgery

## 2023-06-14 ENCOUNTER — Other Ambulatory Visit (HOSPITAL_COMMUNITY): Payer: Self-pay

## 2023-06-14 DIAGNOSIS — M79671 Pain in right foot: Secondary | ICD-10-CM | POA: Diagnosis not present

## 2023-06-14 MED ORDER — DOXYCYCLINE HYCLATE 100 MG PO CAPS
100.0000 mg | ORAL_CAPSULE | Freq: Two times a day (BID) | ORAL | 0 refills | Status: DC
  Filled 2023-06-14: qty 20, 10d supply, fill #0

## 2023-06-14 MED ORDER — FLUCONAZOLE 150 MG PO TABS
150.0000 mg | ORAL_TABLET | Freq: Every day | ORAL | 0 refills | Status: DC
  Filled 2023-06-14: qty 10, 10d supply, fill #0

## 2023-06-14 NOTE — Progress Notes (Signed)
Chief Complaint:   OBESITY [name] is here to discuss her progress with her obesity treatment plan along with follow-up of her obesity related diagnoses. Patrick is on the Category 3 Plan and states she is following her eating plan approximately 75% of the time. Antonella states she is walking for 20-25 minutes 2 times per week.  Today's visit was #: 58 Starting weight: 235 lbs Starting date: 11/30/2018 Today's weight: 220 lbs Today's date: 06/09/2023 Total lbs lost to date: 55 Total lbs lost since last in-office visit: 1  Interim History: Patient continues to do well with her weight loss.  She has been struggling to eat all of the protein on her plan.  Subjective:   1. Pre-diabetes Patient notes increased GI upset when taking metformin in the a.m.  She would like to change it to the p.m.  2. Emotional Eating Behavior Patient is stable on her medications.  She is doing well with decreasing emotional eating behavior overall.  No side effects were noted.  Assessment/Plan:   1. Pre-diabetes We will refill metformin 1000 mg for 1 month.  Patient is okay to take her dose in the evenings with a meal.  - metFORMIN (GLUCOPHAGE) 1000 MG tablet; Take 1 tablet (1,000 mg total) by mouth daily with breakfast.  Dispense: 30 tablet; Refill: 0  2. Emotional Eating Behavior Patient will continue Lexapro 20 mg once daily, and we will refill for 1 month.  - escitalopram (LEXAPRO) 20 MG tablet; Take 1 tablet (20 mg total) by mouth in the morning.  Dispense: 30 tablet; Refill: 0  3. BMI 39.0-39.9,adult  4. Obesity, Beginning BMI 41.63 [name] is currently in the action stage of change. As such, her goal is to continue with weight loss efforts. She has agreed to the Category 3 Plan.   Exercise goals: All adults should avoid inactivity. Some physical activity is better than none, and adults who participate in any amount of physical activity gain some health benefits.  Behavioral modification  strategies: increasing lean protein intake and increasing water intake.  [name] has agreed to follow-up with our clinic in 4 weeks. She was informed of the importance of frequent follow-up visits to maximize her success with intensive lifestyle modifications for her multiple health conditions.   Objective:   Blood pressure 110/71, pulse 68, temperature 98.1 F (36.7 C), height 5\' 3"  (1.6 m), weight 220 lb (99.8 kg), SpO2 97%. Body mass index is 38.97 kg/m.  Lab Results  Component Value Date   CREATININE 0.69 11/13/2022   BUN 16 11/13/2022   NA 138 11/13/2022   K 3.7 11/13/2022   CL 105 11/13/2022   CO2 24 11/13/2022   Lab Results  Component Value Date   ALT 42 (H) 07/27/2022   AST 29 07/27/2022   ALKPHOS 113 07/27/2022   BILITOT <0.2 07/27/2022   Lab Results  Component Value Date   HGBA1C 5.5 12/22/2022   HGBA1C 5.5 05/28/2022   HGBA1C 5.6 10/23/2021   HGBA1C 5.5 05/08/2021   HGBA1C 5.6 05/29/2020   Lab Results  Component Value Date   INSULIN 12.5 05/28/2022   INSULIN 9.3 10/23/2021   INSULIN 22.8 05/08/2021   INSULIN 15.2 05/29/2020   INSULIN 10.3 11/27/2019   Lab Results  Component Value Date   TSH 3.820 05/07/2021   Lab Results  Component Value Date   CHOL 122 05/08/2022   HDL 41 05/08/2022   LDLCALC 64 05/08/2022   TRIG 89 05/08/2022   CHOLHDL 3.0 05/08/2022  Lab Results  Component Value Date   VD25OH 59.4 05/28/2022   VD25OH 39.1 10/23/2021   VD25OH 39.3 05/08/2021   Lab Results  Component Value Date   WBC 12.1 (H) 11/13/2022   HGB 13.5 11/13/2022   HCT 40.5 11/13/2022   MCV 87.1 11/13/2022   PLT 292 11/13/2022   No results found for: "IRON", "TIBC", "FERRITIN"  Attestation Statements:   Reviewed by clinician on day of visit: allergies, medications, problem list, medical history, surgical history, family history, social history, and previous encounter notes.   I, Burt Knack, am acting as transcriptionist for Quillian Quince, MD.  I  have reviewed the above documentation for accuracy and completeness, and I agree with the above. -  Quillian Quince, MD

## 2023-06-24 ENCOUNTER — Other Ambulatory Visit (HOSPITAL_COMMUNITY): Payer: Self-pay

## 2023-06-24 ENCOUNTER — Other Ambulatory Visit: Payer: Self-pay

## 2023-06-24 ENCOUNTER — Other Ambulatory Visit: Payer: Self-pay | Admitting: Cardiovascular Disease

## 2023-06-24 MED ORDER — ROSUVASTATIN CALCIUM 20 MG PO TABS
20.0000 mg | ORAL_TABLET | Freq: Every day | ORAL | 0 refills | Status: DC
  Filled 2023-06-24: qty 90, 90d supply, fill #0

## 2023-06-24 MED ORDER — SPIRONOLACTONE 25 MG PO TABS
25.0000 mg | ORAL_TABLET | ORAL | 0 refills | Status: DC
  Filled 2023-06-24: qty 38, 89d supply, fill #0
  Filled 2023-08-23: qty 38, 89d supply, fill #1

## 2023-06-28 ENCOUNTER — Other Ambulatory Visit: Payer: Self-pay | Admitting: Urology

## 2023-06-28 DIAGNOSIS — N200 Calculus of kidney: Secondary | ICD-10-CM

## 2023-06-29 ENCOUNTER — Encounter: Payer: Self-pay | Admitting: Orthopedic Surgery

## 2023-06-29 NOTE — Progress Notes (Signed)
Patient did not show for her appointment.

## 2023-07-08 ENCOUNTER — Encounter: Payer: Self-pay | Admitting: Urology

## 2023-07-08 ENCOUNTER — Ambulatory Visit (INDEPENDENT_AMBULATORY_CARE_PROVIDER_SITE_OTHER): Payer: Commercial Managed Care - PPO | Admitting: Urology

## 2023-07-08 ENCOUNTER — Ambulatory Visit (HOSPITAL_BASED_OUTPATIENT_CLINIC_OR_DEPARTMENT_OTHER)
Admission: RE | Admit: 2023-07-08 | Discharge: 2023-07-08 | Disposition: A | Discharge status: Home / Self Care | Payer: Commercial Managed Care - PPO | Source: Ambulatory Visit | Attending: Urology | Admitting: Urology

## 2023-07-08 VITALS — BP 141/85 | HR 96 | Ht 63.0 in | Wt 220.0 lb

## 2023-07-08 DIAGNOSIS — N200 Calculus of kidney: Secondary | ICD-10-CM | POA: Diagnosis not present

## 2023-07-08 LAB — URINALYSIS, ROUTINE W REFLEX MICROSCOPIC
Bilirubin, UA: NEGATIVE
Glucose, UA: NEGATIVE
Ketones, UA: NEGATIVE
Leukocytes,UA: NEGATIVE
Nitrite, UA: NEGATIVE
Protein,UA: NEGATIVE
Specific Gravity, UA: 1.01 (ref 1.005–1.030)
Urobilinogen, Ur: 0.2 mg/dL (ref 0.2–1.0)
pH, UA: 6.5 (ref 5.0–7.5)

## 2023-07-08 LAB — MICROSCOPIC EXAMINATION

## 2023-07-08 NOTE — Progress Notes (Signed)
Assessment: 1. Nephrolithiasis     Plan: KUB from today reviewed.  The left renal calculus measures 9 x 3 mm and is unchanged in size and position as compared to study from 2/24.  No obvious calcifications seen in the right kidney or along the expected course of the right ureter. Based on the KUB study today, I do not see an obvious stone that would explain her right-sided back pain.  I discussed further evaluation with CT imaging.  She would like to monitor her symptoms at this time. Continue stone prevention Return to office in 6 months.   Chief Complaint:  Chief Complaint  Patient presents with   Nephrolithiasis    History of Present Illness:  [name] is a 52 y.o. female who is seen for further evaluation of a right ureteral calculus and nephrolithiasis.  She has a history of nephrolithiasis and has previously been treated with a right ureteral stent followed by lithotripsy.  She had a right ureteral stent placed while in [place] for a 9 x 7 mm calculus at the right UPJ with associated mild hydronephrosis.  She underwent right ESL on 08/04/2022 and her stent was removed on 08/12/2022.  Posttreatment KUB from 08/25/2022 showed no obvious calcifications in the area of the right renal shadow or along the expected course of the right ureter and a left renal calculus.   She presented to the emergency room on 11/13/2022 with right-sided pain.  No fevers or chills.  She was having some associated nausea without vomiting.  Evaluation in the emergency room demonstrated a elevated white blood cell count to 12 K.  Renal function was normal. Urinalysis showed 0-5 RBCs, 6-10 WBCs and rare bacteria.  CT renal stone study showed a 4 mm calculus in the proximal right ureter with mild right-sided hydroureter and adjacent 2 mm and 3 mm nonobstructing calculi in the mid left kidney.  She  had continued right-sided flank pain and nausea despite taking pain medication and antiemetics.  She  underwent cystoscopy, right retrograde pyelogram, attempted right uteroscopy, and insertion of a right ureteral stent on 11/17/2022.  Unfortunately, the flexible ureteroscope was not able to pass into the proximal ureter easily. She is s/p right ESL on 12/07/22. Her right ureteral stent was removed on 12/15/22.  At her visit in 2/24, she was doing well following her stent removal.  She was unaware of passing stone fragments.  She was having some left sided abdominal pain following her laparoscopic cholecystectomy.  No right flank pain.  No dysuria or gross hematuria. KUB from 2/24 showed no obvious calcifications in the area of the right renal shadow or along the expected course of the right ureter.  Left renal calcifications appeared stable. Renal ultrasound from 3/24 showed no evidence of hydronephrosis. 24-hour urine from 5/24 showed the following: - low urine volume of 1.12 liters/day - borderline urinary oxalate level - elevated urine uric acid level  Uric acid level was normal.  She returns today for scheduled follow-up.  She reports right sided back pain for approximately 3 weeks.  This has been fairly constant in nature.  The pain is increased with sitting and with getting up to void at night.  She notices relief with ibuprofen.  No fevers, chills, nausea, or vomiting.  No dysuria or gross hematuria.  Portions of the above documentation were copied from a prior visit for review purposes only.   Past Medical History:  Past Medical History:  Diagnosis Date   ADHD (attention  deficit hyperactivity disorder)    Follows w/ PCP, Dr. Lynnea Ferrier, LOV 07/07/22.   Anxiety    Follows w/ PCP, Dr. Lynnea Ferrier, LOV 07/07/22.   Asthma    Follows w/ PCP, Dr. Lynnea Ferrier, LOV 07/07/22.   Back pain    low back pain with right-sided sciatica   Chest pain    Chronic CHF [facility])    Follows w/ cardiologist Dr. Leodis Sias, see most recent OV on 05/08/22 in Epic.   Coronary artery disease     non-obstructive, cardiologist Dr. Leodis Sias, OV 05/08/22. 02/13/22 CT Coronary in Epic, 05/30/21 echo in Epic, 06/04/22 EKG in Epic, 08/18/18 heart cath in Epic, LOV w/ cardiology 05/08/22   Does use hearing aid    Dyspnea    resolved per pt as of 11/17/2022   Echocardiogram 10/25/2015   EF 60-65, normal wall motion, grade 1 diastolic dysfunction, GLS -19.1%   Edema of left lower extremity    Fracture dislocation of elbow joint 01/15/2013   right   GERD (gastroesophageal reflux disease)    Omeprazole as needed, none in 6 mos.   History of echocardiogram 05/30/2021   EF 65 - 70%   History of kidney stones    Hyperlipidemia    Follows w/ PCP, Dr. Lynnea Ferrier @ [facility].   Hypertension    Follows w/ PCP, Dr. [facility]. See OV dated 07/07/2022.   Joint pain    Obesity    Follows with Dr. Quillian Quince at [facility].   Palpitations    Cardiologist, Dr. Leodis Sias.   Pre-diabetes    Follows with [facility], Dr. Quillian Quince, MD, LOV 09/16/22 in Epic.   R/L Cardiac catheterization 08/18/2018   Normal coronary arteries, mean RA 6, mean PA 17, PCWP 10   Seborrheic dermatitis    Sleep apnea    has not been using CPAP for 2 or 3 months per pt on 11/17/22, new one is ordered   Vitamin D deficiency    Wears glasses     Past Surgical History:  Past Surgical History:  Procedure Laterality Date   CESAREAN SECTION  1998; 10/10/2000   x 2   CYSTOSCOPY WITH RETROGRADE PYELOGRAM, URETEROSCOPY AND STENT PLACEMENT Right 11/18/2022   Procedure: CYSTOSCOPY WITH RETROGRADE PYELOGRAM AND STENT PLACEMENT;  Surgeon: Milderd Meager., MD;  Location: [facility];  Service: Urology;  Laterality: Right;   EXTRACORPOREAL SHOCK WAVE LITHOTRIPSY Right 08/04/2022   Procedure: EXTRACORPOREAL SHOCK WAVE LITHOTRIPSY (ESWL);  Surgeon: Malen Gauze, MD;  Location: [facility];  Service: Urology;   Laterality: Right;   EXTRACORPOREAL SHOCK WAVE LITHOTRIPSY Right 12/07/2022   Procedure: EXTRACORPOREAL SHOCK WAVE LITHOTRIPSY (ESWL);  Surgeon: Milderd Meager., MD;  Location: [facility];  Service: Urology;  Laterality: Right;   HOLMIUM LASER APPLICATION Right 11/18/2022   Procedure: HOLMIUM LASER APPLICATION/aborted;  Surgeon: Milderd Meager., MD;  Location[facility];  Service: Urology;  Laterality: Right;   KNEE ARTHROSCOPY Left 1991   also in 2019   LAPAROSCOPIC ASSISTED VAGINAL HYSTERECTOMY  11/02/2006   LAPAROSCOPIC LYSIS OF ADHESIONS  03/06/2005   RADIAL HEAD ARTHROPLASTY Right 01/17/2013   Procedure: RIGHT RADIAL HEAD ARTHROPLASTY OPEN REDUCTION INTERNAL FIXATION CORONOID ;  Surgeon: Tami Ribas, MD;  Location: [facility];  Service: Orthopedics;  Laterality: Right;   RIGHT/LEFT HEART CATH AND CORONARY ANGIOGRAPHY N/A 08/18/2018   Procedure: RIGHT/LEFT  HEART CATH AND CORONARY ANGIOGRAPHY;  Surgeon: Yvonne Kendall, MD;  Location: [facility];  Service: Cardiovascular;  Laterality: N/A;   TUBAL LIGATION  03/06/2005    Allergies:  Allergies  Allergen Reactions   Adhesive [Tape] Rash   Latex Rash    Family History:  Family History  Problem Relation Age of Onset   Diabetes Sister    Heart disease Father 49       MI--1st Dx CAD. Died2that time   Sudden death Father    Diabetes Mother    Hypertension Mother    Heart disease Mother    Obesity Mother    Heart disease Maternal Uncle    Ovarian cancer Maternal Grandmother    Diabetes Maternal Grandmother    Heart disease Maternal Grandmother        Great Grandmother   Diabetes Paternal Grandmother    Liver disease Paternal Aunt    Heart disease Maternal Grandfather     Social History:  Social History   Tobacco Use   Smoking status: Never    Passive exposure: Past   Smokeless tobacco: Never  Vaping Use   Vaping status: Never Used  Substance Use Topics    Alcohol use: No   Drug use: No    ROS: Constitutional:  Negative for fever, chills, weight loss CV: Negative for chest pain, previous MI, hypertension Respiratory:  Negative for shortness of breath, wheezing, sleep apnea, frequent cough GI:  Negative for nausea, vomiting, bloody stool, GERD  Physical exam: BP (!) 141/85   Pulse 96   Ht 5\' 3"  (1.6 m)   Wt 220 lb (99.8 kg)   BMI 38.97 kg/m  GENERAL APPEARANCE:  Well appearing, well developed, well nourished, NAD HEENT:  Atraumatic, normocephalic, oropharynx clear NECK:  Supple without lymphadenopathy or thyromegaly ABDOMEN:  Soft, non-tender, no masses EXTREMITIES:  Moves all extremities well, without clubbing, cyanosis, or edema NEUROLOGIC:  Alert and oriented x 3, normal gait, CN II-XII grossly intact MENTAL STATUS:  appropriate BACK:  Non-tender to palpation, No CVAT SKIN:  Warm, dry, and intact  Results: U/A: 0-5 WBC, 0-2 RBC

## 2023-07-12 ENCOUNTER — Other Ambulatory Visit: Payer: Self-pay | Admitting: Physical Medicine and Rehabilitation

## 2023-07-12 ENCOUNTER — Ambulatory Visit (INDEPENDENT_AMBULATORY_CARE_PROVIDER_SITE_OTHER): Payer: Commercial Managed Care - PPO

## 2023-07-12 DIAGNOSIS — G8929 Other chronic pain: Secondary | ICD-10-CM

## 2023-07-12 DIAGNOSIS — M545 Low back pain, unspecified: Secondary | ICD-10-CM

## 2023-07-12 DIAGNOSIS — M47816 Spondylosis without myelopathy or radiculopathy, lumbar region: Secondary | ICD-10-CM

## 2023-07-12 NOTE — Progress Notes (Signed)
[name] has continued right low back worse with flexion and movement. This has been going for year or two and worsening lately. No radicular leg pain. Xrays last year showed fairly normal spine for age but with some mild OA and transitional segment.  Has had medications time and HEP. Will order MRI lspine with goal of potential intervention.

## 2023-07-13 ENCOUNTER — Ambulatory Visit (INDEPENDENT_AMBULATORY_CARE_PROVIDER_SITE_OTHER): Payer: Commercial Managed Care - PPO | Admitting: Family Medicine

## 2023-07-13 ENCOUNTER — Other Ambulatory Visit (HOSPITAL_COMMUNITY): Payer: Self-pay

## 2023-07-13 ENCOUNTER — Encounter (INDEPENDENT_AMBULATORY_CARE_PROVIDER_SITE_OTHER): Payer: Self-pay | Admitting: Family Medicine

## 2023-07-13 ENCOUNTER — Other Ambulatory Visit: Payer: Self-pay

## 2023-07-13 VITALS — BP 127/85 | HR 76 | Temp 97.8°F | Ht 63.0 in | Wt 225.0 lb

## 2023-07-13 DIAGNOSIS — E669 Obesity, unspecified: Secondary | ICD-10-CM | POA: Diagnosis not present

## 2023-07-13 DIAGNOSIS — Z6835 Body mass index (BMI) 35.0-35.9, adult: Secondary | ICD-10-CM

## 2023-07-13 DIAGNOSIS — F3289 Other specified depressive episodes: Secondary | ICD-10-CM

## 2023-07-13 DIAGNOSIS — Z6839 Body mass index (BMI) 39.0-39.9, adult: Secondary | ICD-10-CM

## 2023-07-13 DIAGNOSIS — R7303 Prediabetes: Secondary | ICD-10-CM

## 2023-07-13 MED ORDER — ESCITALOPRAM OXALATE 20 MG PO TABS
20.0000 mg | ORAL_TABLET | Freq: Every morning | ORAL | 0 refills | Status: DC
Start: 2023-07-13 — End: 2023-09-09
  Filled 2023-07-13: qty 30, 30d supply, fill #0

## 2023-07-13 MED ORDER — METFORMIN HCL 1000 MG PO TABS
1000.0000 mg | ORAL_TABLET | Freq: Every day | ORAL | 0 refills | Status: DC
Start: 2023-07-13 — End: 2023-09-09
  Filled 2023-07-13: qty 30, 30d supply, fill #0

## 2023-07-14 ENCOUNTER — Other Ambulatory Visit: Payer: Self-pay | Admitting: Physical Medicine and Rehabilitation

## 2023-07-14 ENCOUNTER — Encounter (HOSPITAL_COMMUNITY): Payer: Self-pay | Admitting: Pharmacist

## 2023-07-14 ENCOUNTER — Other Ambulatory Visit (HOSPITAL_COMMUNITY): Payer: Self-pay

## 2023-07-14 MED ORDER — PREDNISONE 50 MG PO TABS
50.0000 mg | ORAL_TABLET | Freq: Every day | ORAL | 0 refills | Status: DC
  Filled 2023-07-14: qty 5, 5d supply, fill #0

## 2023-07-14 NOTE — Progress Notes (Signed)
Chief Complaint:   OBESITY [name] is here to discuss her progress with her obesity treatment plan along with follow-up of her obesity related diagnoses. [name] is on the Category 3 Plan and states she is following her eating plan approximately 50% of the time. [name] states she is doing 0 minutes 0 times per week.  Today's visit was #: 59 Starting weight: 235 lbs Starting date: 11/30/2018 Today's weight: 225 lbs Today's date: 07/13/2023 Total lbs lost to date: 10 Total lbs lost since last in-office visit: 0  Interim History: Patient is struggling more with weight gain.  She is retaining some water weight today.  She has been under increased stress, and she has done more eating out and grab and go foods.  Subjective:   1. Pre-diabetes Patient is stable on metformin, and she denies nausea or vomiting.  She is working on decreasing simple carbohydrates but with increased eating out that has been more difficult.  2. Emotional Eating Behavior Patient increased Lexapro to 20 mg last month.  She is not sure it has helped with her mood or emotional eating behavior.  Assessment/Plan:   1. Pre-diabetes We will refill metformin for 1 month.  Patient will continue to work on following the Category 3 plan.  - metFORMIN (GLUCOPHAGE) 1000 MG tablet; Take 1 tablet (1,000 mg total) by mouth daily with breakfast.  Dispense: 30 tablet; Refill: 0  2. Emotional Eating Behavior Patient will continue Lexapro at 20 mg, and we will refill for 1 month; patient may take 6 to 8 weeks to see full benefit.  She will continue to be mindful of her emotional eating behavior.  - escitalopram (LEXAPRO) 20 MG tablet; Take 1 tablet (20 mg total) by mouth in the morning.  Dispense: 30 tablet; Refill: 0  3. BMI 39.0-39.9,adult  4. Obesity, Beginning BMI 41.63 [name] is currently in the action stage of change. As such, her goal is to continue with weight loss efforts. She has agreed to the Category 3 Plan.    Behavioral modification strategies: increasing lean protein intake, decreasing eating out, and meal planning and cooking strategies.  [name] has agreed to follow-up with our clinic in 4 weeks. She was informed of the importance of frequent follow-up visits to maximize her success with intensive lifestyle modifications for her multiple health conditions.   Objective:   Blood pressure 127/85, pulse 76, temperature 97.8 F (36.6 C), height 5\' 3"  (1.6 m), weight 225 lb (102.1 kg), SpO2 97%. Body mass index is 39.86 kg/m.  Lab Results  Component Value Date   CREATININE 0.69 11/13/2022   BUN 16 11/13/2022   NA 138 11/13/2022   K 3.7 11/13/2022   CL 105 11/13/2022   CO2 24 11/13/2022   Lab Results  Component Value Date   ALT 42 (H) 07/27/2022   AST 29 07/27/2022   ALKPHOS 113 07/27/2022   BILITOT <0.2 07/27/2022   Lab Results  Component Value Date   HGBA1C 5.5 12/22/2022   HGBA1C 5.5 05/28/2022   HGBA1C 5.6 10/23/2021   HGBA1C 5.5 05/08/2021   HGBA1C 5.6 05/29/2020   Lab Results  Component Value Date   INSULIN 12.5 05/28/2022   INSULIN 9.3 10/23/2021   INSULIN 22.8 05/08/2021   INSULIN 15.2 05/29/2020   INSULIN 10.3 11/27/2019   Lab Results  Component Value Date   TSH 3.820 05/07/2021   Lab Results  Component Value Date   CHOL 122 05/08/2022   HDL 41 05/08/2022   LDLCALC 64  05/08/2022   TRIG 89 05/08/2022   CHOLHDL 3.0 05/08/2022   Lab Results  Component Value Date   VD25OH 59.4 05/28/2022   VD25OH 39.1 10/23/2021   VD25OH 39.3 05/08/2021   Lab Results  Component Value Date   WBC 12.1 (H) 11/13/2022   HGB 13.5 11/13/2022   HCT 40.5 11/13/2022   MCV 87.1 11/13/2022   PLT 292 11/13/2022   No results found for: "IRON", "TIBC", "FERRITIN"  Attestation Statements:   Reviewed by clinician on day of visit: allergies, medications, problem list, medical history, surgical history, family history, social history, and previous encounter notes.   I, Burt Knack, am acting as transcriptionist for Quillian Quince, MD.  I have reviewed the above documentation for accuracy and completeness, and I agree with the above. -  Quillian Quince, MD

## 2023-07-15 ENCOUNTER — Other Ambulatory Visit (HOSPITAL_COMMUNITY): Payer: Self-pay

## 2023-07-26 ENCOUNTER — Other Ambulatory Visit (HOSPITAL_COMMUNITY): Payer: Self-pay

## 2023-07-27 ENCOUNTER — Other Ambulatory Visit: Payer: Self-pay

## 2023-07-27 ENCOUNTER — Encounter: Payer: Self-pay | Admitting: Cardiovascular Disease

## 2023-07-27 NOTE — Telephone Encounter (Signed)
Returned call to patient and appt scheduled with Alben Spittle, [place] on 08/04/23. Patient states she is having swelling in bilateral legs from calf down and L is worse than R. States she weighed herself before her recent beach trip and is now up 10lbs in approximately one week. Able to get socks and shoes on, no SOB over baseline, no abdominal distention. Still taking Spironolactone three times weekly. No vitals to report-has not checked, advised to do so. Condones 2-3+ pitting. Will route to MD for recommendation until seen.

## 2023-07-27 NOTE — Progress Notes (Signed)
Pt no longer taking per phone call.

## 2023-08-04 ENCOUNTER — Other Ambulatory Visit (HOSPITAL_COMMUNITY): Payer: Self-pay

## 2023-08-04 ENCOUNTER — Ambulatory Visit: Payer: Commercial Managed Care - PPO | Attending: Physician Assistant | Admitting: Physician Assistant

## 2023-08-04 ENCOUNTER — Encounter: Payer: Self-pay | Admitting: Physician Assistant

## 2023-08-04 VITALS — BP 130/88 | HR 94 | Ht 62.5 in | Wt 228.0 lb

## 2023-08-04 DIAGNOSIS — I5032 Chronic diastolic (congestive) heart failure: Secondary | ICD-10-CM | POA: Diagnosis not present

## 2023-08-04 DIAGNOSIS — E7800 Pure hypercholesterolemia, unspecified: Secondary | ICD-10-CM

## 2023-08-04 DIAGNOSIS — I10 Essential (primary) hypertension: Secondary | ICD-10-CM

## 2023-08-04 DIAGNOSIS — I2510 Atherosclerotic heart disease of native coronary artery without angina pectoris: Secondary | ICD-10-CM

## 2023-08-04 HISTORY — DX: Atherosclerotic heart disease of native coronary artery without angina pectoris: I25.10

## 2023-08-04 MED ORDER — FUROSEMIDE 20 MG PO TABS
10.0000 mg | ORAL_TABLET | ORAL | 1 refills | Status: DC | PRN
  Filled 2023-08-04: qty 15, 30d supply, fill #0

## 2023-08-04 NOTE — Progress Notes (Signed)
Cardiology Office Note:    Date:  08/04/2023  ID:  POSEY JACOBE, DOB [birth year], MRN [id number] PCP: Donita Brooks, MD  [facility] Providers Cardiologist:  Kristeen Miss, MD Cardiology APP:  Beatrice Lecher, PA-C       Patient Profile:      (HFpEF) heart failure with preserved ejection fraction  TTE 05/30/2021: EF 65-70, no RWMA, normal RVSF  Coronary artery disease Normal coronary arteries by cath in 2019 CCTA 02/13/2022: CAC score 71.7 (98th percentile); mild nonobstructive CAD (proximal and mid RCA <25, LM distal <25, LAD proximal <25, mid 25-49), aorta size normal Sinus tachycardia due to ADHD Hypertension  OSA ADHD Obesity Hx of syncope  Palpitations   Monitor 11/2020: Occasional PVCs        History of Present Illness:  Discussed the use of AI scribe software for clinical note transcription with the patient, who gave verbal consent to proceed.    [name] is a 52 y.o. female who returns for follow-up of CHF, CAD.  She was last seen by Dr. Elease Hashimoto June 2023.  She is here alone.  She presents with recent history of bilateral leg swelling. She noticed the swelling a couple of weeks ago.  She notes 2+ pitting edema. The swelling has since decreased, but it is still present. The patient reports that the swelling sometimes improves when she elevates her legs. She also noticed a weight increase around the time the swelling started, but she has since lost about five pounds. The patient denies experiencing shortness of breath or chest pain. She does not weigh herself daily.  She has not had orthopnea, PND or syncope.    ROS: See HPI    Studies Reviewed:        Risk Assessment/Calculations:             Physical Exam:   VS:  BP 130/88   Pulse 94   Ht 5' 2.5" (1.588 m)   Wt 228 lb (103.4 kg)   SpO2 97%   BMI 41.04 kg/m    Wt Readings from Last 3 Encounters:  08/04/23 228 lb (103.4 kg)  07/13/23 225 lb (102.1 kg)  07/08/23 220 lb (99.8 kg)     Constitutional:      Appearance: Healthy appearance. Not in distress.  Neck:     Vascular: No carotid bruit or JVR. JVD normal.  Pulmonary:     Breath sounds: Normal breath sounds. No wheezing. No rales.  Cardiovascular:     Normal rate. Regular rhythm.     Murmurs: There is no murmur.  Edema:    Peripheral edema present.    Pretibial: bilateral trace edema of the pretibial area. Abdominal:     Palpations: Abdomen is soft.      Assessment and Plan:     Heart Failure with Preserved Ejection Fraction Recent episode of fluid overload with leg swelling and weight gain, now resolved. No current symptoms of volume overload.  NYHA II. -Continue Entresto 24/26mg  twice daily and Spironolactone 25mg  daily. -Prescribe Furosemide 10mg  to be taken as needed for weight gain >3lbs in one day. Patient is to contact our office if one dose does not improve volume status given prior history of syncope. -Follow-up 1 year  Coronary Artery Disease Mild non-obstructive disease on coronary CTA from March 2023.  She is doing well with no current angina symptoms. -Continue Aspirin 81mg  daily and Crestor 20mg  daily.  Hypertension Borderline control. Recent home blood pressure readings optimal. -Continue  Metoprolol Tartrate 50mg  twice daily, Entresto 24/26mg  twice daily, and Spironolactone 25mg  daily. -Patient to continue monitoring blood pressure at home and notify office if readings remain above target (<130/80).  Hyperlipidemia Last LDL was 64 in June 2023. -Continue Crestor 20mg  daily. -Order fasting comprehensive metabolic panel and lipid panel to update cholesterol management.        Dispo:  Return in about 1 year (around 08/03/2024) for Routine Follow Up, w/ Dr. Elease Hashimoto, or Tereso Newcomer, PA-C.  Signed, Tereso Newcomer, PA-C

## 2023-08-04 NOTE — Patient Instructions (Signed)
Medication Instructions:  Your physician has recommended you make the following change in your medication:   START Lasix 20 taking 1/2 tablet only as needed for wt increase of more then 3 lbs in a day *If you need a refill on your cardiac medications before your next appointment, please call your pharmacy*   Lab Work: 08/06/23 come to the lab anytime after 7:15 FASTING:  CMET & LIPID  If you have labs (blood work) drawn today and your tests are completely normal, you will receive your results only by: MyChart Message (if you have MyChart) OR A paper copy in the mail If you have any lab test that is abnormal or we need to change your treatment, we will call you to review the results.   Testing/Procedures: None ordered   Follow-Up: At [facility], you and your health needs are our priority.  As part of our continuing mission to provide you with exceptional heart care, we have created designated Provider Care Teams.  These Care Teams include your primary Cardiologist (physician) and Advanced Practice Providers (APPs -  Physician Assistants and Nurse Practitioners) who all work together to provide you with the care you need, when you need it.  We recommend signing up for the patient portal called "MyChart".  Sign up information is provided on this After Visit Summary.  MyChart is used to connect with patients for Virtual Visits (Telemedicine).  Patients are able to view lab/test results, encounter notes, upcoming appointments, etc.  Non-urgent messages can be sent to your provider as well.   To learn more about what you can do with MyChart, go to [email or web address].    Your next appointment:   1 year(s)  Provider:   Kristeen Miss, MD  or Tereso Newcomer, PA-C         Other Instructions

## 2023-08-06 ENCOUNTER — Ambulatory Visit: Payer: Commercial Managed Care - PPO | Attending: Family Medicine

## 2023-08-11 ENCOUNTER — Encounter (INDEPENDENT_AMBULATORY_CARE_PROVIDER_SITE_OTHER): Payer: Self-pay | Admitting: Family Medicine

## 2023-08-11 ENCOUNTER — Ambulatory Visit (INDEPENDENT_AMBULATORY_CARE_PROVIDER_SITE_OTHER): Payer: Commercial Managed Care - PPO | Admitting: Family Medicine

## 2023-08-11 VITALS — BP 116/74 | HR 66 | Temp 98.0°F | Ht 63.0 in | Wt 224.0 lb

## 2023-08-11 DIAGNOSIS — Z6839 Body mass index (BMI) 39.0-39.9, adult: Secondary | ICD-10-CM

## 2023-08-11 DIAGNOSIS — R5383 Other fatigue: Secondary | ICD-10-CM | POA: Diagnosis not present

## 2023-08-11 DIAGNOSIS — E669 Obesity, unspecified: Secondary | ICD-10-CM | POA: Diagnosis not present

## 2023-08-11 NOTE — Progress Notes (Unsigned)
Chief Complaint:   OBESITY Wiktoria is here to discuss her progress with her obesity treatment plan along with follow-up of her obesity related diagnoses. Briseida is on the Category 3 Plan and states she is following her eating plan approximately 60% of the time. Ascencion states she is doing 0 minutes 0 times per week.  Today's visit was #: 60 Starting weight: 235 lbs Starting date: 11/30/2018 Today's weight: 224 lbs Today's date: 08/11/2023 Total lbs lost to date: 11 Total lbs lost since last in-office visit: 1  Interim History: Patient continues to do well with her weight loss.  She is watching her grandbaby and she has not been able to exercise as much.  Subjective:   1. Fatigue Patient notes increased fatigue and daytime somnolence, worse with watching her grandbaby more often.  Assessment/Plan:   1. Fatigue We discussed the importance of quality sleep for health and weight loss.  Patient will continue using her CPAP regularly to help with fatigue.  2. BMI 39.0-39.9,adult  3. Obesity, Beginning BMI 41.63 Yumna is currently in the action stage of change. As such, her goal is to continue with weight loss efforts. She has agreed to the Category 3 Plan.   Behavioral modification strategies: increasing lean protein intake.  Gwennette has agreed to follow-up with our clinic in 4 weeks. She was informed of the importance of frequent follow-up visits to maximize her success with intensive lifestyle modifications for her multiple health conditions.   Objective:   Blood pressure 116/74, pulse 66, temperature 98 F (36.7 C), height 5\' 3"  (1.6 m), weight 224 lb (101.6 kg), SpO2 95%. Body mass index is 39.68 kg/m.  Lab Results  Component Value Date   CREATININE 0.69 11/13/2022   BUN 16 11/13/2022   NA 138 11/13/2022   K 3.7 11/13/2022   CL 105 11/13/2022   CO2 24 11/13/2022   Lab Results  Component Value Date   ALT 42 (H) 07/27/2022   AST 29 07/27/2022   ALKPHOS 113  07/27/2022   BILITOT <0.2 07/27/2022   Lab Results  Component Value Date   HGBA1C 5.5 12/22/2022   HGBA1C 5.5 05/28/2022   HGBA1C 5.6 10/23/2021   HGBA1C 5.5 05/08/2021   HGBA1C 5.6 05/29/2020   Lab Results  Component Value Date   INSULIN 12.5 05/28/2022   INSULIN 9.3 10/23/2021   INSULIN 22.8 05/08/2021   INSULIN 15.2 05/29/2020   INSULIN 10.3 11/27/2019   Lab Results  Component Value Date   TSH 3.820 05/07/2021   Lab Results  Component Value Date   CHOL 122 05/08/2022   HDL 41 05/08/2022   LDLCALC 64 05/08/2022   TRIG 89 05/08/2022   CHOLHDL 3.0 05/08/2022   Lab Results  Component Value Date   VD25OH 59.4 05/28/2022   VD25OH 39.1 10/23/2021   VD25OH 39.3 05/08/2021   Lab Results  Component Value Date   WBC 12.1 (H) 11/13/2022   HGB 13.5 11/13/2022   HCT 40.5 11/13/2022   MCV 87.1 11/13/2022   PLT 292 11/13/2022   No results found for: "IRON", "TIBC", "FERRITIN"  Attestation Statements:   Reviewed by clinician on day of visit: allergies, medications, problem list, medical history, surgical history, family history, social history, and previous encounter notes.  Time spent on visit including pre-visit chart review and post-visit care and charting was 30 minutes.   I, Burt Knack, am acting as transcriptionist for Quillian Quince, MD.  I have reviewed the above documentation for accuracy and completeness,  and I agree with the above. -  Quillian Quince, MD

## 2023-08-23 ENCOUNTER — Other Ambulatory Visit (HOSPITAL_COMMUNITY): Payer: Self-pay

## 2023-08-23 ENCOUNTER — Other Ambulatory Visit: Payer: Self-pay | Admitting: Cardiovascular Disease

## 2023-08-23 ENCOUNTER — Other Ambulatory Visit: Payer: Self-pay

## 2023-08-23 MED ORDER — ENTRESTO 24-26 MG PO TABS
1.0000 | ORAL_TABLET | Freq: Two times a day (BID) | ORAL | 11 refills | Status: DC
  Filled 2023-08-23: qty 60, 30d supply, fill #0

## 2023-09-09 ENCOUNTER — Other Ambulatory Visit (HOSPITAL_COMMUNITY): Payer: Self-pay

## 2023-09-09 ENCOUNTER — Encounter (INDEPENDENT_AMBULATORY_CARE_PROVIDER_SITE_OTHER): Payer: Self-pay | Admitting: Family Medicine

## 2023-09-09 ENCOUNTER — Ambulatory Visit (INDEPENDENT_AMBULATORY_CARE_PROVIDER_SITE_OTHER): Payer: Commercial Managed Care - PPO | Admitting: Family Medicine

## 2023-09-09 ENCOUNTER — Other Ambulatory Visit: Payer: Self-pay

## 2023-09-09 VITALS — BP 126/83 | HR 85 | Temp 97.8°F | Ht 63.0 in | Wt 226.0 lb

## 2023-09-09 DIAGNOSIS — E7849 Other hyperlipidemia: Secondary | ICD-10-CM

## 2023-09-09 DIAGNOSIS — Z6841 Body Mass Index (BMI) 40.0 and over, adult: Secondary | ICD-10-CM

## 2023-09-09 DIAGNOSIS — E669 Obesity, unspecified: Secondary | ICD-10-CM | POA: Diagnosis not present

## 2023-09-09 DIAGNOSIS — F5089 Other specified eating disorder: Secondary | ICD-10-CM

## 2023-09-09 DIAGNOSIS — F3289 Other specified depressive episodes: Secondary | ICD-10-CM

## 2023-09-09 MED ORDER — ESCITALOPRAM OXALATE 20 MG PO TABS
20.0000 mg | ORAL_TABLET | Freq: Every morning | ORAL | 0 refills | Status: DC
Start: 2023-09-09 — End: 2024-02-29
  Filled 2023-09-09: qty 90, 90d supply, fill #0

## 2023-09-09 NOTE — Progress Notes (Signed)
Chief Complaint:   OBESITY [name] is here to discuss her progress with her obesity treatment plan along with follow-up of her obesity related diagnoses. [name] is on the Category 3 Plan and states she is following her eating plan approximately 25% of the time. [name] states she is doing 0 minutes 0 times per week.  Today's visit was #: 61 Starting weight: 235 lbs Starting date: 11/30/2018 Today's weight: 226 lbs Today's date: 09/09/2023 Total lbs lost to date: 9 Total lbs lost since last in-office visit: 0  Interim History: Patient normally sees Dr. Dalbert Garnet.  Feels like she hasn't been doing much.  She has been sick, and has had quite a bit of other obligations (familial).  She is awaiting another [place] being born.  Work has been very busy the last few weeks.  She has been fairly sick on the metformin.   Subjective:   1. Other hyperlipidemia Patient is on Crestor daily, and she denies myalgias or transaminitis.   2. Emotional Eating Behavior Patient is on Lexapro daily. She denies suicidal or homicidal ideations.   Assessment/Plan:   1. Other hyperlipidemia We will repeat labs at patient's next appointment.  2. Emotional Eating Behavior We will refill Lexapro 20 mg once daily for 90 days.   - escitalopram (LEXAPRO) 20 MG tablet; Take 1 tablet (20 mg total) by mouth in the morning.  Dispense: 90 tablet; Refill: 0  3. BMI 40.0-44.9, adult (HCC)  4. Obesity with starting BMI of 41.6 [name] is currently in the action stage of change. As such, her goal is to continue with weight loss efforts. She has agreed to keeping a food journal and adhering to recommended goals of 1800-1900 calories and 140+ grams of protein daily.   We will repeat fasting IC at patient's next appointment.   Exercise goals: No exercise has been prescribed at this time.  Behavioral modification strategies: increasing lean protein intake, meal planning and cooking strategies, keeping healthy  foods in the home, and planning for success.  [name] has agreed to follow-up with our clinic in 5 to 7 weeks. She was informed of the importance of frequent follow-up visits to maximize her success with intensive lifestyle modifications for her multiple health conditions.   Objective:   Blood pressure 126/83, pulse 85, temperature 97.8 F (36.6 C), height 5\' 3"  (1.6 m), weight 226 lb (102.5 kg), SpO2 98%. Body mass index is 40.03 kg/m.  General: Cooperative, alert, well developed, in no acute distress. HEENT: Conjunctivae and lids unremarkable. Cardiovascular: Regular rhythm.  Lungs: Normal work of breathing. Neurologic: No focal deficits.   Lab Results  Component Value Date   CREATININE 0.69 11/13/2022   BUN 16 11/13/2022   NA 138 11/13/2022   K 3.7 11/13/2022   CL 105 11/13/2022   CO2 24 11/13/2022   Lab Results  Component Value Date   ALT 42 (H) 07/27/2022   AST 29 07/27/2022   ALKPHOS 113 07/27/2022   BILITOT <0.2 07/27/2022   Lab Results  Component Value Date   HGBA1C 5.5 12/22/2022   HGBA1C 5.5 05/28/2022   HGBA1C 5.6 10/23/2021   HGBA1C 5.5 05/08/2021   HGBA1C 5.6 05/29/2020   Lab Results  Component Value Date   INSULIN 12.5 05/28/2022   INSULIN 9.3 10/23/2021   INSULIN 22.8 05/08/2021   INSULIN 15.2 05/29/2020   INSULIN 10.3 11/27/2019   Lab Results  Component Value Date   TSH 3.820 05/07/2021   Lab Results  Component Value Date  CHOL 122 05/08/2022   HDL 41 05/08/2022   LDLCALC 64 05/08/2022   TRIG 89 05/08/2022   CHOLHDL 3.0 05/08/2022   Lab Results  Component Value Date   VD25OH 59.4 05/28/2022   VD25OH 39.1 10/23/2021   VD25OH 39.3 05/08/2021   Lab Results  Component Value Date   WBC 12.1 (H) 11/13/2022   HGB 13.5 11/13/2022   HCT 40.5 11/13/2022   MCV 87.1 11/13/2022   PLT 292 11/13/2022   No results found for: "IRON", "TIBC", "FERRITIN"  Attestation Statements:   Reviewed by clinician on day of visit: allergies,  medications, problem list, medical history, surgical history, family history, social history, and previous encounter notes.   I, Burt Knack, am acting as transcriptionist for Reuben Likes, MD.  I have reviewed the above documentation for accuracy and completeness, and I agree with the above. - Reuben Likes, MD

## 2023-10-28 ENCOUNTER — Ambulatory Visit (INDEPENDENT_AMBULATORY_CARE_PROVIDER_SITE_OTHER): Payer: Commercial Managed Care - PPO | Admitting: Family Medicine

## 2023-11-29 ENCOUNTER — Encounter: Payer: Self-pay | Admitting: Family Medicine

## 2023-11-29 ENCOUNTER — Other Ambulatory Visit: Payer: Self-pay

## 2023-11-30 ENCOUNTER — Other Ambulatory Visit: Payer: Self-pay

## 2023-11-30 ENCOUNTER — Other Ambulatory Visit: Payer: Self-pay | Admitting: Family Medicine

## 2023-11-30 MED ORDER — MALATHION 0.5 % EX LOTN
TOPICAL_LOTION | Freq: Once | CUTANEOUS | 0 refills | Status: AC
  Filled 2023-11-30: qty 59, fill #0
  Filled 2023-12-14: qty 59, 1d supply, fill #0

## 2023-12-02 ENCOUNTER — Other Ambulatory Visit (HOSPITAL_COMMUNITY): Payer: Self-pay

## 2023-12-14 ENCOUNTER — Other Ambulatory Visit (HOSPITAL_COMMUNITY): Payer: Self-pay

## 2023-12-14 ENCOUNTER — Other Ambulatory Visit: Payer: Self-pay

## 2024-02-29 ENCOUNTER — Encounter (INDEPENDENT_AMBULATORY_CARE_PROVIDER_SITE_OTHER): Payer: Self-pay | Admitting: Family Medicine

## 2024-02-29 ENCOUNTER — Other Ambulatory Visit (HOSPITAL_COMMUNITY): Payer: Self-pay

## 2024-02-29 ENCOUNTER — Ambulatory Visit (INDEPENDENT_AMBULATORY_CARE_PROVIDER_SITE_OTHER): Admitting: Family Medicine

## 2024-02-29 VITALS — BP 109/69 | HR 78 | Temp 98.0°F | Ht 63.0 in | Wt 223.0 lb

## 2024-02-29 DIAGNOSIS — R7303 Prediabetes: Secondary | ICD-10-CM | POA: Diagnosis not present

## 2024-02-29 DIAGNOSIS — R0602 Shortness of breath: Secondary | ICD-10-CM | POA: Diagnosis not present

## 2024-02-29 DIAGNOSIS — Z6839 Body mass index (BMI) 39.0-39.9, adult: Secondary | ICD-10-CM | POA: Diagnosis not present

## 2024-02-29 DIAGNOSIS — F3289 Other specified depressive episodes: Secondary | ICD-10-CM

## 2024-02-29 DIAGNOSIS — F5089 Other specified eating disorder: Secondary | ICD-10-CM

## 2024-02-29 DIAGNOSIS — E669 Obesity, unspecified: Secondary | ICD-10-CM

## 2024-02-29 MED ORDER — ESCITALOPRAM OXALATE 10 MG PO TABS
10.0000 mg | ORAL_TABLET | Freq: Every morning | ORAL | 0 refills | Status: DC
  Filled 2024-02-29: qty 30, 30d supply, fill #0

## 2024-02-29 NOTE — Progress Notes (Signed)
 Office: [phone]  /  Fax: [phone]  WEIGHT SUMMARY AND BIOMETRICS  Anthropometric Measurements Height: 5\' 3"  (1.6 m) Weight: 223 lb (101.2 kg) BMI (Calculated): 39.51 Weight at Last Visit: 226lb Weight Lost Since Last Visit: 3lb Weight Gained Since Last Visit: 0lb Starting Weight: 235 lb Total Weight Loss (lbs): 12 lb (5.443 kg) Peak Weight: 235lb   Body Composition  Body Fat %: 46.8 % Fat Mass (lbs): 104.6 lbs Muscle Mass (lbs): 112.8 lbs Total Body Water (lbs): 93.6 lbs Visceral Fat Rating : 14   Other Clinical Data RMR: 1930 Fasting: Yes Labs: No Today's Visit #: 62 Starting Date: 11/27/18 Comments: IC DONE TODAY    Chief Complaint: OBESITY    History of Present Illness She is a 53 year old female with obesity and prediabetes who presents for obesity treatment plan assessment and progress evaluation.  She has lost three pounds since her last visit in October 2024, with her current weight at 223 pounds, down from 226 pounds. She is actively working on diet and exercise to manage her prediabetes and prevent the onset of type 2 diabetes.  She has a history of prediabetes and was previously on metformin but discontinued it due to concerns about side effects. She has not been taking her medications regularly, including metformin and citalopram, due to nausea and testing to identify the cause of her symptoms.  Significant life stressors include the recent passing of her mother-in-law in December and taking emergency custody of her grandchildren, a five-month-old and a two-and-a-half-year-old. This has impacted her ability to maintain a structured eating plan, leading to challenges with meal planning and portion control.  She reports not taking any of her medications regularly, including Strattera, due to nausea and headaches. She has been off citalopram and metformin since December and is cautious about restarting them due to previous side effects.  Her sleep  is disrupted due to caring for her grandchildren, with an irregular and often interrupted sleep schedule. She is constantly on the move from early morning until late at night, affecting her ability to rest adequately.      PHYSICAL EXAM:  Blood pressure 109/69, pulse 78, temperature 98 F (36.7 C), height 5\' 3"  (1.6 m), weight 223 lb (101.2 kg), SpO2 98%. Body mass index is 39.5 kg/m.  DIAGNOSTIC DATA REVIEWED:  BMET    Component Value Date/Time   NA 138 11/13/2022 2354   NA 141 07/27/2022 1234   K 3.7 11/13/2022 2354   CL 105 11/13/2022 2354   CO2 24 11/13/2022 2354   GLUCOSE 135 (H) 11/13/2022 2354   BUN 16 11/13/2022 2354   BUN 10 07/27/2022 1234   CREATININE 0.69 11/13/2022 2354   CREATININE 0.63 12/24/2017 0818   CALCIUM 9.6 11/13/2022 2354   GFRNONAA >60 11/13/2022 2354   GFRNONAA 81 07/23/2022 1411   GFRNONAA 109 12/24/2017 0818   GFRAA 123 10/03/2020 1351   GFRAA 126 12/24/2017 0818   Lab Results  Component Value Date   HGBA1C 5.5 12/22/2022   HGBA1C 6.0 (H) 11/30/2018   Lab Results  Component Value Date   INSULIN 12.5 05/28/2022   INSULIN 14.5 11/30/2018   Lab Results  Component Value Date   TSH 3.820 05/07/2021   CBC    Component Value Date/Time   WBC 12.1 (H) 11/13/2022 2354   RBC 4.65 11/13/2022 2354   HGB 13.5 11/13/2022 2354   HGB 15.3 05/07/2021 1515   HCT 40.5 11/13/2022 2354   HCT 45.6 05/07/2021 1515   PLT  292 11/13/2022 2354   PLT 409 05/07/2021 1515   MCV 87.1 11/13/2022 2354   MCV 86 05/07/2021 1515   MCH 29.0 11/13/2022 2354   MCHC 33.3 11/13/2022 2354   RDW 12.9 11/13/2022 2354   RDW 13.1 05/07/2021 1515   Iron Studies No results found for: "IRON", "TIBC", "FERRITIN", "IRONPCTSAT" Lipid Panel     Component Value Date/Time   CHOL 122 05/08/2022 0919   TRIG 89 05/08/2022 0919   HDL 41 05/08/2022 0919   CHOLHDL 3.0 05/08/2022 0919   CHOLHDL 4.2 12/24/2017 0818   VLDL 22 03/11/2015 0847   LDLCALC 64 05/08/2022 0919    LDLCALC 134 (H) 12/24/2017 0818   Hepatic Function Panel     Component Value Date/Time   PROT 6.3 07/27/2022 1234   ALBUMIN 3.7 (L) 07/27/2022 1234   AST 29 07/27/2022 1234   ALT 42 (H) 07/27/2022 1234   ALKPHOS 113 07/27/2022 1234   BILITOT <0.2 07/27/2022 1234      Component Value Date/Time   TSH 3.820 05/07/2021 1515   Nutritional Lab Results  Component Value Date   VD25OH 59.4 05/28/2022   VD25OH 39.1 10/23/2021   VD25OH 39.3 05/08/2021     Assessment and Plan Assessment & Plan Obesity She has lost 3 pounds since October 2024 despite significant life stressors, including the passing of her mother-in-law and taking custody of her grandchildren. A structured eating plan is challenging due to her circumstances. GLP-1 medications are contraindicated due to pancreatitis and gallstones. The focus is on portion control, smart food choices, and managing stress-related eating. Medications are considered to assist with reducing physical and emotional hunger, but GLP-1s are avoided due to potential harm. - Encourage portion control and smart food choices, including protein at every meal and 4-5 servings of fruits and vegetables daily. - Avoid fried foods and high-calorie drinks. - Provide a handout on portion control and smart choices. - Consider a modified category two eating plan if needed.  Emotional eating behaviors She experiences emotional eating behaviors, likely exacerbated by stress from her current life situation. She has not been taking escitalopram regularly, which was previously increased from 10 mg to 20 mg. Restarting escitalopram at 10 mg is recommended to avoid nausea and to help manage emotional eating and stress. Regular intake is advised to achieve therapeutic benefits. - Restart escitalopram at 10 mg daily. - Provide a prescription for escitalopram 10 mg if needed. - Advise regular intake of escitalopram rather than intermittent use.  Prediabetes She manages  prediabetes with diet and exercise. Metformin is on hold due to concerns about side effects and her desire to establish a routine first. - Continue to manage prediabetes with diet and exercise. - Hold off on metformin for now.  Exertional dyspnea Unchanged, likely due to exercise intolerance IC repeated today 1930, above calculated BMR    She was informed of the importance of frequent follow up visits to maximize her success with intensive lifestyle modifications for her multiple health conditions.    Jasmine Mesi, MD

## 2024-04-18 ENCOUNTER — Ambulatory Visit (INDEPENDENT_AMBULATORY_CARE_PROVIDER_SITE_OTHER): Admitting: Family Medicine

## 2024-05-09 ENCOUNTER — Ambulatory Visit (INDEPENDENT_AMBULATORY_CARE_PROVIDER_SITE_OTHER): Admitting: Family Medicine

## 2024-08-02 ENCOUNTER — Other Ambulatory Visit: Payer: Self-pay | Admitting: Family Medicine

## 2024-08-03 ENCOUNTER — Encounter (HOSPITAL_COMMUNITY): Payer: Self-pay

## 2024-08-03 ENCOUNTER — Other Ambulatory Visit (HOSPITAL_COMMUNITY): Payer: Self-pay

## 2024-08-11 DIAGNOSIS — H903 Sensorineural hearing loss, bilateral: Secondary | ICD-10-CM | POA: Diagnosis not present

## 2024-10-17 ENCOUNTER — Ambulatory Visit: Admitting: Family

## 2024-10-17 ENCOUNTER — Other Ambulatory Visit: Payer: Self-pay

## 2024-10-17 ENCOUNTER — Encounter: Payer: Self-pay | Admitting: Family

## 2024-10-17 DIAGNOSIS — M25775 Osteophyte, left foot: Secondary | ICD-10-CM

## 2024-10-17 DIAGNOSIS — M25572 Pain in left ankle and joints of left foot: Secondary | ICD-10-CM

## 2024-10-17 NOTE — Progress Notes (Signed)
 Office Visit Note   Patient: [name]           Date of Birth: [birth year]           MRN: [id number] Visit Date: 10/17/2024              Requested by: Duanne Butler ONEIDA, MD [address] [place],  [place] [address] PCP: Duanne Butler ONEIDA, MD  Chief Complaint  Patient presents with   Left Foot - Pain      HPI: The patient is a 53 year old woman who presents today complaining of weeks to months history of left dorsal foot pain pain with weightbearing she has been walking along the lateral column to avoid pain  No associated injuries swelling or bruising  Assessment & Plan: Visit Diagnoses:  1. Pain in left ankle and joints of left foot     Plan: left foot pain likely result of osteophytic spurring, recommended stiff walking shoes vs orthotics.    Follow-Up Instructions: No follow-ups on file.   Left Ankle Exam   Tenderness  The patient is experiencing no tenderness.  Swelling: mild  Range of Motion  The patient has normal left ankle ROM.   Tests  Anterior drawer: negative  Other  Erythema: absent Pulse: present      Patient is alert, oriented, no adenopathy, well-dressed, normal affect, normal respiratory effort. Foot is plantigrade. Tender to palpation over the dorsum, to 3rd cuniform - first Metatarsal base     Imaging: No results found. No images are attached to the encounter.  Labs: Lab Results  Component Value Date   HGBA1C 5.5 12/22/2022   HGBA1C 5.5 05/28/2022   HGBA1C 5.6 10/23/2021   LABURIC 5.2 06/14/2023     Lab Results  Component Value Date   ALBUMIN 3.7 (L) 07/27/2022   ALBUMIN 4.2 06/04/2022   ALBUMIN 4.3 05/28/2022    No results found for: MG Lab Results  Component Value Date   VD25OH 59.4 05/28/2022   VD25OH 39.1 10/23/2021   VD25OH 39.3 05/08/2021    No results found for: PREALBUMIN    Latest Ref Rng & Units 11/13/2022   11:54 PM 06/04/2022    3:09 AM 01/23/2022    7:31 PM  CBC EXTENDED  WBC  4.0 - 10.5 K/uL 12.1  15.3  11.8   RBC 3.87 - 5.11 MIL/uL 4.65  5.07  5.11   Hemoglobin 12.0 - 15.0 g/dL 86.4  85.0  85.3   HCT 36.0 - 46.0 % 40.5  44.6  44.7   Platelets 150 - 400 K/uL 292  301  402   NEUT# 1.7 - 7.7 K/uL 6.7     Lymph# 0.7 - 4.0 K/uL 4.5        There is no height or weight on file to calculate BMI.  Orders:  Orders Placed This Encounter  Procedures   XR Foot Complete Left   No orders of the defined types were placed in this encounter.    Procedures: No procedures performed  Clinical Data: No additional findings.  ROS:  All other systems negative, except as noted in the HPI. Review of Systems  Objective: Vital Signs: There were no vitals taken for this visit.  Specialty Comments:  No specialty comments available.  PMFS History: Patient Active Problem List   Diagnosis Date Noted   CAD (coronary artery disease) 08/04/2023   BMI 39.0-39.9,adult 05/11/2023   Calculus of gallbladder with acute cholecystitis without obstruction 12/24/2022   Ureteral  calculus, right 11/17/2022   Flank pain 11/17/2022   Elevated lipase 09/16/2022   Attention deficit disorder 09/16/2022   Abnormal serum level of lipase 07/27/2022   Nephrolithiasis 07/27/2022   Low back pain 07/13/2022   Dehydration 06/25/2022   At risk for dehydration 04/27/2022   Chest tightness 01/30/2022   Palpitations 10/03/2020   At risk for diabetes mellitus 08/12/2020   Asthma 05/21/2020   H/O: hysterectomy 05/21/2020   Prediabetes 12/29/2018   Essential hypertension 09/02/2018   Atypical chest pain 08/18/2018   Short of breath on exertion 08/18/2018   ADD (attention deficit disorder) 12/29/2017   (HFpEF) heart failure with preserved ejection fraction (HCC) 08/24/2016   Sinus tachycardia (HCC) 07/23/2016   Diastolic dysfunction 12/17/2015   PVC (premature ventricular contraction) 12/17/2015   Family history of premature coronary artery disease 03/14/2015   GERD (gastroesophageal  reflux disease) 05/02/2014   Anal fissure 05/02/2014   Leg swelling 05/07/2013   Morbid obesity (HCC) 05/07/2013   Spasm of muscle 12/05/2010   Pain in thoracic spine 12/05/2010   Acute upper respiratory infection 12/05/2010   Vitamin D  deficiency 08/20/2010   Other abnormal glucose 08/20/2010   Hypothyroidism 08/20/2010   Depression 07/31/2010   Anxiety state 07/31/2010   Seborrheic dermatitis, unspecified 09/18/2008   Past Medical History:  Diagnosis Date   (HFpEF) heart failure with preserved ejection fraction (HCC) 08/24/2016   ADHD (attention deficit hyperactivity disorder)    Follows w/ PCP, Dr. Butler Burr, LOV 07/07/22.   Anxiety    Follows w/ PCP, Dr. Butler Burr, LOV 07/07/22.   Asthma    Follows w/ PCP, Dr. Butler Burr, LOV 07/07/22.   Back pain    low back pain with right-sided sciatica   CAD (coronary artery disease) 08/04/2023   CCTA 02/13/2022: CAC score 71.7 (98th percentile); mild nonobstructive CAD (proximal and mid RCA <25, LM distal <25, LAD proximal <25, mid 25-49), aorta size normal    Chest pain    Chronic CHF [facility])    Follows w/ cardiologist Dr. Manus Passe, see most recent OV on 05/08/22 in Epic.   Coronary artery disease    non-obstructive, cardiologist Dr. Manus Passe, OV 05/08/22. 02/13/22 CT Coronary in Epic, 05/30/21 echo in Epic, 06/04/22 EKG in Epic, 08/18/18 heart cath in Epic, LOV w/ cardiology 05/08/22   Does use hearing aid    Dyspnea    resolved per pt as of 11/17/2022   Echocardiogram 10/25/2015   EF 60-65, normal wall motion, grade 1 diastolic dysfunction, GLS -19.1%   Edema of left lower extremity    Fracture dislocation of elbow joint 01/15/2013   right   GERD (gastroesophageal reflux disease)    Omeprazole as needed, none in 6 mos.   History of echocardiogram 05/30/2021   EF 65 - 70%   History of kidney stones    Hyperlipidemia    Follows w/ PCP, Dr. Butler Burr @ [facility].   Hypertension    Follows w/  PCP, Dr. [facility]. See OV dated 07/07/2022.   Joint pain    Obesity    Follows with Dr. Louann Penton at [facility].   Palpitations    Cardiologist, Dr. Manus Passe.   Pre-diabetes    Follows with [facility], Dr. Louann Penton, MD, LOV 09/16/22 in Epic.   R/L Cardiac catheterization 08/18/2018   Normal coronary arteries, mean RA 6, mean PA 17, PCWP 10   Seborrheic dermatitis  Sleep apnea    has not been using CPAP for 2 or 3 months per pt on 11/17/22, new one is ordered   Vitamin D  deficiency    Wears glasses     Family History  Problem Relation Age of Onset   Diabetes Sister    Heart disease Father 7       MI--1st Dx CAD. Died2that time   Sudden death Father    Diabetes Mother    Hypertension Mother    Heart disease Mother    Obesity Mother    Heart disease Maternal Uncle    Ovarian cancer Maternal Grandmother    Diabetes Maternal Grandmother    Heart disease Maternal Grandmother        Great Grandmother   Diabetes Paternal Grandmother    Liver disease Paternal Aunt    Heart disease Maternal Grandfather     Past Surgical History:  Procedure Laterality Date   CESAREAN SECTION  1998; 10/10/2000   x 2   CYSTOSCOPY WITH RETROGRADE PYELOGRAM, URETEROSCOPY AND STENT PLACEMENT Right 11/18/2022   Procedure: CYSTOSCOPY WITH RETROGRADE PYELOGRAM AND STENT PLACEMENT;  Surgeon: Roseann Adine PARAS., MD;  Location: [facility];  Service: Urology;  Laterality: Right;   EXTRACORPOREAL SHOCK WAVE LITHOTRIPSY Right 08/04/2022   Procedure: EXTRACORPOREAL SHOCK WAVE LITHOTRIPSY (ESWL);  Surgeon: Sherrilee Belvie CROME, MD;  Location: [facility];  Service: Urology;  Laterality: Right;   EXTRACORPOREAL SHOCK WAVE LITHOTRIPSY Right 12/07/2022   Procedure: EXTRACORPOREAL SHOCK WAVE LITHOTRIPSY (ESWL);  Surgeon: Roseann Adine PARAS., MD;  Location: [facility];  Service: Urology;  Laterality: Right;    HOLMIUM LASER APPLICATION Right 11/18/2022   Procedure: HOLMIUM LASER APPLICATION/aborted;  Surgeon: Roseann Adine PARAS., MD;  Location: [facility];  Service: Urology;  Laterality: Right;   KNEE ARTHROSCOPY Left 1991   also in 2019   LAPAROSCOPIC ASSISTED VAGINAL HYSTERECTOMY  11/02/2006   LAPAROSCOPIC LYSIS OF ADHESIONS  03/06/2005   RADIAL HEAD ARTHROPLASTY Right 01/17/2013   Procedure: RIGHT RADIAL HEAD ARTHROPLASTY OPEN REDUCTION INTERNAL FIXATION CORONOID ;  Surgeon: Franky JONELLE Curia, MD;  Location: [facility];  Service: Orthopedics;  Laterality: Right;   RIGHT/LEFT HEART CATH AND CORONARY ANGIOGRAPHY N/A 08/18/2018   Procedure: RIGHT/LEFT HEART CATH AND CORONARY ANGIOGRAPHY;  Surgeon: Mady Bruckner, MD;  Location: [facility];  Service: Cardiovascular;  Laterality: N/A;   TUBAL LIGATION  03/06/2005   Social History   Occupational History   Occupation: CMA    Employer: [employer]  Tobacco Use   Smoking status: Never    Passive exposure: Past   Smokeless tobacco: Never  Vaping Use   Vaping status: Never Used  Substance and Sexual Activity   Alcohol use: No   Drug use: No   Sexual activity: Not on file    Comment: hysterectomy

## 2024-10-25 ENCOUNTER — Telehealth: Payer: Self-pay | Admitting: Physician Assistant

## 2024-10-25 ENCOUNTER — Encounter: Payer: Self-pay | Admitting: Physician Assistant

## 2024-10-25 NOTE — Telephone Encounter (Signed)
 Patient recall deleted after calling 3x and sending letter.

## 2024-11-07 ENCOUNTER — Telehealth: Payer: Self-pay | Admitting: Physician Assistant

## 2024-11-07 ENCOUNTER — Ambulatory Visit (HOSPITAL_COMMUNITY)
Admission: RE | Admit: 2024-11-07 | Discharge: 2024-11-07 | Disposition: A | Discharge status: Home / Self Care | Source: Ambulatory Visit | Attending: Cardiology | Admitting: Cardiology

## 2024-11-07 DIAGNOSIS — R609 Edema, unspecified: Secondary | ICD-10-CM | POA: Diagnosis not present

## 2024-11-07 DIAGNOSIS — I10 Essential (primary) hypertension: Secondary | ICD-10-CM

## 2024-11-07 DIAGNOSIS — Z79899 Other long term (current) drug therapy: Secondary | ICD-10-CM

## 2024-11-07 MED ORDER — SPIRONOLACTONE 25 MG PO TABS: 25.0000 mg | ORAL_TABLET | Freq: Every day | ORAL | Status: DC

## 2024-11-07 NOTE — Telephone Encounter (Signed)
 Left message for patient to call back

## 2024-11-07 NOTE — Telephone Encounter (Signed)
 Per Dr. Sheena (DOD): Yes please order the Venous duplex- it will be ok to restart her Aldactone  . Lets wait on the metoprolol  until she see DR. Melvenia Sermon with patient and discussed recommendation from Dr. Sheena. Patient asked if she should take spironolactone  25 mg daily. Currently prescribed to be taken 3 times weekly. Sent message to Dr. Sheena for clarification on medication frequency.  Informed patient our scheduling team will be calling her to schedule appt for venous duplex testing. Patient verbalized understanding.  Patient states we may send her a MyChart message with clarification from Dr. Sheena on spironolactone  frequency.  Response from Dr. Sheena regarding spironolactone : I would prefer we do a daily? Do we know why she is not taking it daily but 3 times a week   Dr. Alveta switched patient from Lasix  to spironolactone  25 mg 3x/week in 2022 due to hypokalemia.  Dr. Sheena recommends patient have BMET drawn in 1 week to assess kidney function and potassium level on spironolactone .  Patient notified via MyChart as requested.

## 2024-11-07 NOTE — Telephone Encounter (Signed)
 Pt c/o swelling/edema: STAT if pt has developed SOB within 24 hours  If swelling, where is the swelling located? Legs and knee  How much weight have you gained and in what time span? N/A  Have you gained 2 pounds in a day or 5 pounds in a week? No  Do you have a log of your daily weights (if so, list)? No  Are you currently taking a fluid pill? Not at the moment   Are you currently SOB? No  Have you traveled recently in a car or plane for an extended period of time?

## 2024-11-07 NOTE — Telephone Encounter (Signed)
Negative for DVT bilaterally.

## 2024-11-07 NOTE — Telephone Encounter (Signed)
 Patient called back. Patient complaining of edema in bilateral lower extremities, left worse than right, but her right foot and ankle has pitting edema. Patient stated she has some pain, but not redness or hot to touch. Patient's BP 171/91 and HR 95. Patient stopped taking her medications due to feeling nauseated and she has been off these for a while. Patient is not sure what medications were making her sick. Patient stated she could take one of her spirolactone pills and see if she tolerates it. Patient was a Dr. Alveta patient, and has an appointment with Dr. Kriste on 11/20/24. Patient is worried about DVTs and her elevated BP. Will see if DOD, Dr. Sheena, can advise.

## 2024-11-08 ENCOUNTER — Ambulatory Visit: Payer: Self-pay | Admitting: Cardiology

## 2024-11-16 LAB — BASIC METABOLIC PANEL WITH GFR
BUN/Creatinine Ratio: 17 (ref 9–23)
BUN: 10 mg/dL (ref 6–24)
CO2: 26 mmol/L (ref 20–29)
Calcium: 9.1 mg/dL (ref 8.7–10.2)
Chloride: 101 mmol/L (ref 96–106)
Creatinine, Ser: 0.6 mg/dL (ref 0.57–1.00)
Glucose: 120 mg/dL — ABNORMAL HIGH (ref 70–99)
Potassium: 4.3 mmol/L (ref 3.5–5.2)
Sodium: 139 mmol/L (ref 134–144)
eGFR: 109 mL/min/1.73

## 2024-11-17 ENCOUNTER — Encounter: Payer: Self-pay | Admitting: *Deleted

## 2024-11-20 ENCOUNTER — Other Ambulatory Visit (HOSPITAL_COMMUNITY): Payer: Self-pay

## 2024-11-20 ENCOUNTER — Encounter: Payer: Self-pay | Admitting: Internal Medicine

## 2024-11-20 ENCOUNTER — Ambulatory Visit: Attending: Internal Medicine | Admitting: Internal Medicine

## 2024-11-20 VITALS — BP 120/82 | HR 87 | Ht 62.0 in | Wt 236.0 lb

## 2024-11-20 DIAGNOSIS — I5033 Acute on chronic diastolic (congestive) heart failure: Secondary | ICD-10-CM

## 2024-11-20 DIAGNOSIS — Z79899 Other long term (current) drug therapy: Secondary | ICD-10-CM | POA: Diagnosis not present

## 2024-11-20 DIAGNOSIS — R609 Edema, unspecified: Secondary | ICD-10-CM

## 2024-11-20 DIAGNOSIS — E7800 Pure hypercholesterolemia, unspecified: Secondary | ICD-10-CM | POA: Diagnosis not present

## 2024-11-20 DIAGNOSIS — I10 Essential (primary) hypertension: Secondary | ICD-10-CM

## 2024-11-20 MED ORDER — FUROSEMIDE 20 MG PO TABS
10.0000 mg | ORAL_TABLET | Freq: Every day | ORAL | 5 refills | Status: AC | PRN
  Filled 2024-11-20 – 2024-12-01 (×2): qty 15, 30d supply, fill #0

## 2024-11-20 NOTE — Progress Notes (Addendum)
 " Cardiology Office Note:  .   Date:  11/20/2024  ID:  [name], DOB 1971/01/27, MRN [id number] PCP: Duanne Butler ONEIDA, MD  [facility] Providers Cardiologist:  Aleene Passe, MD (Inactive) Cardiology APP:  Lelon Glendia ONEIDA, PA-C    History of Present Illness: .   Jenkins SHRYOCK is a 54 y.o. female Discussed the use of AI scribe software for clinical note transcription with the patient, who gave verbal consent to proceed.  History of Present Illness [name] is a 54 year old female with heart failure with preserved ejection fraction who presents for a one year follow-up and to discuss bilateral lower extremity edema.  She has not been taking any of her medications due to nausea with the exception of recently restarting spironolactone  25 mg at the direction of the office.  Lower extremity edema - Bilateral lower extremity swelling, left leg more affected than right - Significant enlargement of legs on Tuesday before Christmas - Pitting edema present, took approximately one minute to resolve after palpation - Ultrasound of lower extremities on November 07, 2024, negative for deep vein thrombosis - No recent changes in medication regimen prior to onset of edema  Heart failure with preserved ejection fraction - History of heart failure with preserved ejection fraction - Current medications: spironolactone  25 mg daily - Not currently taking Entresto  or Lasix ; previously discontinued due to nausea, which improved after stopping these medications  Dyspnea and cardiac symptoms - Shortness of breath during the week of Christmas, now resolved - No current chest pain or palpitations - History of palpitations  Sleep apnea evaluation - Tested for sleep apnea - Not using CPAP therapy  Cardiac diagnostic studies - Coronary CTA in March 2023 showed mild nonobstructive coronary artery disease - Coronary calcium  score of 71, 98th percentile - Echocardiogram from July  2022 showed ejection fraction of 65-70%, no regional wall motion abnormalities, normal diastology      ROS: Remaining review of systems reviewed and negative  Studies Reviewed: SABRA   EKG Interpretation Date/Time:  Monday November 20 2024 11:42:03 EST Ventricular Rate:  87 PR Interval:  160 QRS Duration:  82 QT Interval:  386 QTC Calculation: 464 R Axis:   8  Text Interpretation: Normal sinus rhythm Possible Anterior infarct , age undetermined When compared with ECG of 04-Jun-2022 02:53, No significant change since Confirmed by Kriste Hicks [phone]) on 11/20/2024 11:54:03 AM    Results Labs BMP: Potassium and renal function within normal limits; blood glucose mildly elevated; calcium  and kidney function normal   Radiology Coronary CTA (2022-02-13): Coronary calcium  score 71, 98th percentile; nonobstructive multivessel coronary artery disease  Diagnostic Echocardiogram (2021-05-30): Left ventricular ejection fraction 65-70%; no regional wall motion abnormalities; normal diastolic function Bilateral lower extremity venous ultrasound (2024-11-07): Negative for deep vein thrombosis Risk Assessment/Calculations:             Physical Exam:   VS:  BP 120/82 (BP Location: Left Arm, Patient Position: Sitting, Cuff Size: Large)   Pulse 87   Ht 5' 2 (1.575 m)   Wt 236 lb (107 kg)   SpO2 97%   BMI 43.16 kg/m    Wt Readings from Last 3 Encounters:  11/20/24 236 lb (107 kg)  02/29/24 223 lb (101.2 kg)  09/09/23 226 lb (102.5 kg)    GEN: Well nourished, well developed in no acute distress NECK: No JVD; No carotid bruits CARDIAC: RRR, no murmurs, no rubs, no gallops RESPIRATORY:  Clear to auscultation  without rales, wheezing or rhonchi  ABDOMEN: Soft, non-tender, non-distended EXTREMITIES:  No edema; No deformity   ASSESSMENT AND PLAN: .    Assessment and Plan Assessment & Plan Acute on chronic heart failure with preserved ejection fraction Recent exacerbation with bilateral  lower extremity edema, left greater than right, and associated shortness of breath. Symptoms improved with spironolactone . Nausea and dry heaves likely due to medication side effects, leading to discontinuation of Entresto  and Lasix . No recent hospitalizations for heart failure. Echocardiogram from July 2022 showed ejection fraction of 65-70% with normal diastology but with medial e` of 5. Discussed potential use of Farxiga or Jardiance  for heart failure management, considering history of UTIs and kidney stones.  Currently appears at/near euvolemia she is not taking her Entresto , Lopressor  or as needed Lasix  - Ordered BNP to assess fluid status - Ordered echocardiogram to evaluate heart function - Restarted Lasix  10 mg as needed for fluid management - Continue spironolactone  25 mg daily - Will initiate Farxiga or Jardiance , depending on insurance coverage, to aid in heart failure management and fluid control - update echo - Monitor blood pressure regularly while on spironolactone  and new medication  Coronary artery disease, nonobstructive Nonobstructive multivessel disease confirmed by coronary CTA in 2022 with a coronary calcium  score of 71, placing her in the 98th percentile. Currently not taking aspirin  and Crestor  for management. - If no nausea at the next visit would recommend at least restarting her Crestor   Essential hypertension Blood pressure currently well controlled with spironolactone . Discussed potential blood pressure lowering effects of new heart failure medication. - Continue spironolactone  25 mg daily - Monitor blood pressure regularly, especially with initiation of new heart failure medication  Hyperlipidemia Not taking Crestor  - Consider restarting Crestor  if nausea is improved at next visit            Follow up: 3 months  Signed, Emeline FORBES Calender, DO  11/20/2024 12:08 PM    [facility] "

## 2024-11-20 NOTE — Patient Instructions (Addendum)
" ° °  Medication Instructions:   Dr. Kriste recommends: Jardiance  10mg  daily OR Farxiga 10mg  daily Our pharmacy tech team will check with insurance to see which is preferred/covered with insurance  *If you need a refill on your cardiac medications before your next appointment, please call your pharmacy*  Lab Work:  BNP today -- 1st Floor  If you have labs (blood work) drawn today and your tests are completely normal, you will receive your results only by: MyChart Message (if you have MyChart) OR A paper copy in the mail If you have any lab test that is abnormal or we need to change your treatment, we will call you to review the results.  Testing/Procedures:  Echocardiogram   Follow-Up: At [facility], you and your health needs are our priority.  As part of our continuing mission to provide you with exceptional heart care, our providers are all part of one team.  This team includes your primary Cardiologist (physician) and Advanced Practice Providers or APPs (Physician Assistants and Nurse Practitioners) who all work together to provide you with the care you need, when you need it.  Your next appointment:    3 months with Dr. Kriste  We recommend signing up for the patient portal called MyChart.  Sign up information is provided on this After Visit Summary.  MyChart is used to connect with patients for Virtual Visits (Telemedicine).  Patients are able to view lab/test results, encounter notes, upcoming appointments, etc.  Non-urgent messages can be sent to your provider as well.   To learn more about what you can do with MyChart, go to [email or web address].   Other Instructions           "

## 2024-11-20 NOTE — Addendum Note (Signed)
 Addended by: LORING ANDRIETTE HERO on: 11/20/2024 12:14 PM   Modules accepted: Orders

## 2024-11-21 ENCOUNTER — Telehealth: Payer: Self-pay | Admitting: Pharmacy Technician

## 2024-11-21 ENCOUNTER — Other Ambulatory Visit (HOSPITAL_COMMUNITY): Payer: Self-pay

## 2024-11-21 LAB — BRAIN NATRIURETIC PEPTIDE: BNP: 5 pg/mL (ref 0.0–100.0)

## 2024-11-21 NOTE — Telephone Encounter (Signed)
 Pharmacy Patient Advocate Encounter  Insurance verification completed.   The patient is insured through [facility]   Ran test claim for Jardiance  10 MG. Currently a quantity of 30 is a 30 day supply and the co-pay is $66.29 .  Ran test claim for Farxiga BRAND 10 MG. Currently a quantity of 30 is a 30 day supply and the co-pay is $111.30   This test claim was processed through [facility]- copay amounts may vary at other pharmacies due to pharmacy/plan contracts, or as the patient moves through the different stages of their insurance plan.

## 2024-11-22 ENCOUNTER — Other Ambulatory Visit (HOSPITAL_COMMUNITY): Payer: Self-pay

## 2024-11-22 ENCOUNTER — Ambulatory Visit: Payer: Self-pay | Admitting: Internal Medicine

## 2024-11-22 DIAGNOSIS — Z79899 Other long term (current) drug therapy: Secondary | ICD-10-CM

## 2024-11-22 MED ORDER — EMPAGLIFLOZIN 10 MG PO TABS
10.0000 mg | ORAL_TABLET | Freq: Every day | ORAL | 11 refills | Status: AC
  Filled 2024-11-22 – 2024-12-01 (×2): qty 30, 30d supply, fill #0

## 2024-11-30 ENCOUNTER — Other Ambulatory Visit (HOSPITAL_COMMUNITY): Payer: Self-pay

## 2024-12-01 ENCOUNTER — Other Ambulatory Visit (HOSPITAL_COMMUNITY): Payer: Self-pay

## 2024-12-20 ENCOUNTER — Encounter: Payer: Self-pay | Admitting: Internal Medicine

## 2024-12-20 ENCOUNTER — Other Ambulatory Visit (HOSPITAL_COMMUNITY): Payer: Self-pay

## 2024-12-20 ENCOUNTER — Encounter (HOSPITAL_COMMUNITY): Payer: Self-pay

## 2024-12-20 MED ORDER — SPIRONOLACTONE 25 MG PO TABS
25.0000 mg | ORAL_TABLET | Freq: Every day | ORAL | 3 refills | Status: AC
  Filled 2024-12-20: qty 90, 90d supply, fill #0

## 2024-12-21 ENCOUNTER — Ambulatory Visit (HOSPITAL_COMMUNITY)
Admission: RE | Admit: 2024-12-21 | Discharge: 2024-12-21 | Disposition: A | Source: Ambulatory Visit | Attending: Internal Medicine | Admitting: Internal Medicine

## 2024-12-21 ENCOUNTER — Other Ambulatory Visit (HOSPITAL_COMMUNITY): Payer: Self-pay

## 2024-12-21 DIAGNOSIS — I5033 Acute on chronic diastolic (congestive) heart failure: Secondary | ICD-10-CM

## 2024-12-22 ENCOUNTER — Other Ambulatory Visit (HOSPITAL_COMMUNITY): Payer: Self-pay
